# Patient Record
Sex: Male | Born: 1935
Health system: Southern US, Community
[De-identification: ages and names within clinical notes are randomized; demographics above are authoritative.]

## PROBLEM LIST (undated history)

## (undated) DIAGNOSIS — R42 Dizziness and giddiness: Secondary | ICD-10-CM

## (undated) DIAGNOSIS — B952 Enterococcus as the cause of diseases classified elsewhere: Secondary | ICD-10-CM

## (undated) DIAGNOSIS — K449 Diaphragmatic hernia without obstruction or gangrene: Secondary | ICD-10-CM

## (undated) DIAGNOSIS — N183 Chronic kidney disease, stage 3 (moderate): Secondary | ICD-10-CM

## (undated) DIAGNOSIS — A419 Sepsis, unspecified organism: Secondary | ICD-10-CM

## (undated) DIAGNOSIS — N390 Urinary tract infection, site not specified: Secondary | ICD-10-CM

## (undated) DIAGNOSIS — R5381 Other malaise: Secondary | ICD-10-CM

## (undated) DIAGNOSIS — E1129 Type 2 diabetes mellitus with other diabetic kidney complication: Secondary | ICD-10-CM

## (undated) DIAGNOSIS — N400 Enlarged prostate without lower urinary tract symptoms: Secondary | ICD-10-CM

## (undated) DIAGNOSIS — N189 Chronic kidney disease, unspecified: Secondary | ICD-10-CM

## (undated) DIAGNOSIS — D45 Polycythemia vera: Principal | ICD-10-CM

## (undated) DIAGNOSIS — D471 Chronic myeloproliferative disease: Secondary | ICD-10-CM

## (undated) DIAGNOSIS — E119 Type 2 diabetes mellitus without complications: Secondary | ICD-10-CM

## (undated) DIAGNOSIS — E11628 Type 2 diabetes mellitus with other skin complications: Secondary | ICD-10-CM

## (undated) DIAGNOSIS — E46 Unspecified protein-calorie malnutrition: Secondary | ICD-10-CM

## (undated) DIAGNOSIS — K209 Esophagitis, unspecified without bleeding: Secondary | ICD-10-CM

## (undated) DIAGNOSIS — G929 Unspecified toxic encephalopathy: Secondary | ICD-10-CM

## (undated) DIAGNOSIS — G92 Toxic encephalopathy: Secondary | ICD-10-CM

## (undated) DIAGNOSIS — F0390 Unspecified dementia without behavioral disturbance: Secondary | ICD-10-CM

## (undated) DIAGNOSIS — M1990 Unspecified osteoarthritis, unspecified site: Secondary | ICD-10-CM

## (undated) DIAGNOSIS — L03119 Cellulitis of unspecified part of limb: Secondary | ICD-10-CM

## (undated) DIAGNOSIS — I872 Venous insufficiency (chronic) (peripheral): Secondary | ICD-10-CM

## (undated) DIAGNOSIS — D696 Thrombocytopenia, unspecified: Secondary | ICD-10-CM

## (undated) DIAGNOSIS — I10 Essential (primary) hypertension: Secondary | ICD-10-CM

## (undated) DIAGNOSIS — E785 Hyperlipidemia, unspecified: Secondary | ICD-10-CM

## (undated) DIAGNOSIS — G43909 Migraine, unspecified, not intractable, without status migrainosus: Secondary | ICD-10-CM

## (undated) DIAGNOSIS — I214 Non-ST elevation (NSTEMI) myocardial infarction: Secondary | ICD-10-CM

## (undated) DIAGNOSIS — M48061 Spinal stenosis, lumbar region without neurogenic claudication: Secondary | ICD-10-CM

## (undated) DIAGNOSIS — K219 Gastro-esophageal reflux disease without esophagitis: Secondary | ICD-10-CM

## (undated) HISTORY — DX: Diaphragmatic hernia without obstruction or gangrene: K44.9

## (undated) HISTORY — PX: LOWER LEG SOFT TISSUE TUMOR EXCISION: SUR553

## (undated) HISTORY — DX: Polycythemia vera: D45

## (undated) HISTORY — DX: Type 2 diabetes mellitus with other skin complications: E11.628

## (undated) HISTORY — DX: Toxic encephalopathy: G92

## (undated) HISTORY — DX: Venous insufficiency (chronic) (peripheral): I87.2

## (undated) HISTORY — DX: Unspecified dementia, unspecified severity, without behavioral disturbance, psychotic disturbance, mood disturbance, and anxiety: F03.90

## (undated) HISTORY — DX: Type 2 diabetes mellitus without complications: E11.9

## (undated) HISTORY — DX: Cellulitis of unspecified part of limb: L03.119

## (undated) HISTORY — DX: Chronic kidney disease, unspecified: N18.9

## (undated) HISTORY — DX: Chronic myeloproliferative disease: D47.1

## (undated) HISTORY — DX: Non-ST elevation (NSTEMI) myocardial infarction: I21.4

## (undated) HISTORY — DX: Migraine, unspecified, not intractable, without status migrainosus: G43.909

## (undated) HISTORY — DX: Unspecified toxic encephalopathy: G92.9

## (undated) HISTORY — DX: Unspecified osteoarthritis, unspecified site: M19.90

## (undated) HISTORY — DX: Other malaise: R53.81

## (undated) HISTORY — DX: Essential (primary) hypertension: I10

## (undated) HISTORY — DX: Dizziness and giddiness: R42

## (undated) HISTORY — DX: Unspecified protein-calorie malnutrition: E46

## (undated) HISTORY — DX: Benign prostatic hyperplasia without lower urinary tract symptoms: N40.0

## (undated) HISTORY — DX: Spinal stenosis, lumbar region without neurogenic claudication: M48.061

## (undated) HISTORY — DX: Hyperlipidemia, unspecified: E78.5

## (undated) HISTORY — DX: Type 2 diabetes mellitus with other diabetic kidney complication: E11.29

## (undated) HISTORY — DX: Esophagitis, unspecified: K20.9

## (undated) HISTORY — DX: Esophagitis, unspecified without bleeding: K20.90

## (undated) HISTORY — DX: Gastro-esophageal reflux disease without esophagitis: K21.9

## (undated) HISTORY — DX: Urinary tract infection, site not specified: N39.0

## (undated) HISTORY — DX: Enterococcus as the cause of diseases classified elsewhere: B95.2

## (undated) HISTORY — DX: Thrombocytopenia, unspecified: D69.6

## (undated) HISTORY — DX: Sepsis, unspecified organism: A41.9

## (undated) HISTORY — DX: Chronic kidney disease, stage 3 (moderate): N18.3

---

## 1991-02-14 HISTORY — PX: TOTAL HIP ARTHROPLASTY: SHX124

## 1998-02-02 ENCOUNTER — Ambulatory Visit (HOSPITAL_COMMUNITY): Admission: RE | Admit: 1998-02-02 | Discharge: 1998-02-02 | Payer: Self-pay | Admitting: *Deleted

## 1998-09-10 ENCOUNTER — Encounter: Admission: RE | Admit: 1998-09-10 | Discharge: 1998-09-13 | Payer: Self-pay | Admitting: Family Medicine

## 1998-09-16 ENCOUNTER — Encounter: Admission: RE | Admit: 1998-09-16 | Discharge: 1998-09-16 | Payer: Self-pay | Admitting: Family Medicine

## 1998-10-15 ENCOUNTER — Encounter (HOSPITAL_COMMUNITY): Payer: Self-pay | Admitting: Oncology

## 1998-10-15 ENCOUNTER — Ambulatory Visit (HOSPITAL_COMMUNITY): Admission: RE | Admit: 1998-10-15 | Discharge: 1998-10-15 | Payer: Self-pay | Admitting: Oncology

## 1998-11-30 ENCOUNTER — Ambulatory Visit (HOSPITAL_COMMUNITY): Admission: RE | Admit: 1998-11-30 | Discharge: 1998-11-30 | Payer: Self-pay | Admitting: Oncology

## 1999-02-08 ENCOUNTER — Encounter: Admission: RE | Admit: 1999-02-08 | Discharge: 1999-02-08 | Payer: Self-pay | Admitting: *Deleted

## 2002-05-21 ENCOUNTER — Ambulatory Visit (HOSPITAL_COMMUNITY): Admission: RE | Admit: 2002-05-21 | Discharge: 2002-05-21 | Payer: Self-pay | Admitting: *Deleted

## 2003-12-10 ENCOUNTER — Ambulatory Visit: Payer: Self-pay | Admitting: Oncology

## 2003-12-15 ENCOUNTER — Ambulatory Visit: Payer: Self-pay | Admitting: Oncology

## 2004-02-16 ENCOUNTER — Ambulatory Visit: Payer: Self-pay | Admitting: Oncology

## 2004-04-18 ENCOUNTER — Ambulatory Visit: Payer: Self-pay | Admitting: Oncology

## 2004-06-15 ENCOUNTER — Ambulatory Visit: Payer: Self-pay | Admitting: Oncology

## 2004-08-08 ENCOUNTER — Ambulatory Visit: Payer: Self-pay | Admitting: Oncology

## 2004-10-03 ENCOUNTER — Ambulatory Visit: Payer: Self-pay | Admitting: Oncology

## 2004-12-14 ENCOUNTER — Ambulatory Visit: Payer: Self-pay | Admitting: Oncology

## 2005-02-09 ENCOUNTER — Ambulatory Visit: Payer: Self-pay | Admitting: Oncology

## 2005-04-05 ENCOUNTER — Ambulatory Visit: Payer: Self-pay | Admitting: Oncology

## 2005-06-13 ENCOUNTER — Ambulatory Visit: Payer: Self-pay | Admitting: Oncology

## 2005-06-15 LAB — CBC WITH DIFFERENTIAL/PLATELET
Basophils Absolute: 0 10*3/uL (ref 0.0–0.1)
EOS%: 2.2 % (ref 0.0–7.0)
HCT: 40.4 % (ref 38.7–49.9)
HGB: 13 g/dL (ref 13.0–17.1)
MCH: 22.3 pg — ABNORMAL LOW (ref 28.0–33.4)
MCV: 69.4 fL — ABNORMAL LOW (ref 81.6–98.0)
MONO%: 8.2 % (ref 0.0–13.0)
NEUT%: 58.7 % (ref 40.0–75.0)

## 2005-08-07 ENCOUNTER — Ambulatory Visit: Payer: Self-pay | Admitting: Oncology

## 2005-08-07 LAB — CBC WITH DIFFERENTIAL/PLATELET
Basophils Absolute: 0 10*3/uL (ref 0.0–0.1)
Eosinophils Absolute: 0.1 10*3/uL (ref 0.0–0.5)
HCT: 41.3 % (ref 38.7–49.9)
HGB: 13.2 g/dL (ref 13.0–17.1)
MONO#: 0.5 10*3/uL (ref 0.1–0.9)
NEUT%: 67.5 % (ref 40.0–75.0)
WBC: 5.9 10*3/uL (ref 4.0–10.0)
lymph#: 1.3 10*3/uL (ref 0.9–3.3)

## 2005-09-25 ENCOUNTER — Encounter: Admission: RE | Admit: 2005-09-25 | Discharge: 2005-09-25 | Payer: Self-pay | Admitting: Internal Medicine

## 2005-09-27 ENCOUNTER — Encounter: Admission: RE | Admit: 2005-09-27 | Discharge: 2005-09-27 | Payer: Self-pay | Admitting: Internal Medicine

## 2005-09-29 ENCOUNTER — Ambulatory Visit: Payer: Self-pay | Admitting: Oncology

## 2005-10-05 LAB — CBC WITH DIFFERENTIAL/PLATELET
BASO%: 0.6 % (ref 0.0–2.0)
EOS%: 1.7 % (ref 0.0–7.0)
HCT: 41.7 % (ref 38.7–49.9)
HGB: 13.5 g/dL (ref 13.0–17.1)
MCH: 22.6 pg — ABNORMAL LOW (ref 28.0–33.4)
MCHC: 32.4 g/dL (ref 32.0–35.9)
MONO#: 0.5 10*3/uL (ref 0.1–0.9)
NEUT%: 60.6 % (ref 40.0–75.0)
RDW: 17.6 % — ABNORMAL HIGH (ref 11.2–14.6)
WBC: 5.9 10*3/uL (ref 4.0–10.0)
lymph#: 1.7 10*3/uL (ref 0.9–3.3)

## 2005-10-19 LAB — CBC WITH DIFFERENTIAL/PLATELET
Basophils Absolute: 0 10*3/uL (ref 0.0–0.1)
Eosinophils Absolute: 0.1 10*3/uL (ref 0.0–0.5)
HCT: 41.7 % (ref 38.7–49.9)
HGB: 13.4 g/dL (ref 13.0–17.1)
MCH: 22.4 pg — ABNORMAL LOW (ref 28.0–33.4)
MONO#: 0.3 10*3/uL (ref 0.1–0.9)
NEUT#: 2.6 10*3/uL (ref 1.5–6.5)
NEUT%: 57.6 % (ref 40.0–75.0)
RDW: 17.7 % — ABNORMAL HIGH (ref 11.2–14.6)
lymph#: 1.5 10*3/uL (ref 0.9–3.3)

## 2005-10-19 LAB — COMPREHENSIVE METABOLIC PANEL
ALT: 14 U/L (ref 0–40)
CO2: 24 mEq/L (ref 19–32)
Creatinine, Ser: 1.34 mg/dL (ref 0.40–1.50)
Glucose, Bld: 225 mg/dL — ABNORMAL HIGH (ref 70–99)
Total Bilirubin: 0.8 mg/dL (ref 0.3–1.2)

## 2005-10-19 LAB — LACTATE DEHYDROGENASE: LDH: 146 U/L (ref 94–250)

## 2005-11-13 LAB — CBC WITH DIFFERENTIAL/PLATELET
BASO%: 0.6 % (ref 0.0–2.0)
EOS%: 2.6 % (ref 0.0–7.0)
LYMPH%: 30.5 % (ref 14.0–48.0)
MCH: 22.6 pg — ABNORMAL LOW (ref 28.0–33.4)
MCHC: 32.1 g/dL (ref 32.0–35.9)
MONO#: 0.3 10*3/uL (ref 0.1–0.9)
RBC: 5.48 10*6/uL (ref 4.20–5.71)
WBC: 4.8 10*3/uL (ref 4.0–10.0)
lymph#: 1.5 10*3/uL (ref 0.9–3.3)

## 2005-11-15 ENCOUNTER — Encounter: Admission: RE | Admit: 2005-11-15 | Discharge: 2005-11-15 | Payer: Self-pay | Admitting: Oncology

## 2005-11-24 ENCOUNTER — Ambulatory Visit (HOSPITAL_COMMUNITY): Admission: RE | Admit: 2005-11-24 | Discharge: 2005-11-24 | Payer: Self-pay | Admitting: Oncology

## 2005-12-19 ENCOUNTER — Ambulatory Visit: Payer: Self-pay | Admitting: Oncology

## 2005-12-21 LAB — CBC WITH DIFFERENTIAL/PLATELET
BASO%: 0.5 % (ref 0.0–2.0)
LYMPH%: 26.4 % (ref 14.0–48.0)
MCH: 22.4 pg — ABNORMAL LOW (ref 28.0–33.4)
MCHC: 32 g/dL (ref 32.0–35.9)
MCV: 70.1 fL — ABNORMAL LOW (ref 81.6–98.0)
MONO%: 7.3 % (ref 0.0–13.0)
NEUT%: 62.5 % (ref 40.0–75.0)
Platelets: 191 10*3/uL (ref 145–400)
RBC: 5.54 10*6/uL (ref 4.20–5.71)

## 2006-02-10 ENCOUNTER — Ambulatory Visit: Payer: Self-pay | Admitting: Oncology

## 2006-02-15 LAB — CBC WITH DIFFERENTIAL/PLATELET
Basophils Absolute: 0.1 10*3/uL (ref 0.0–0.1)
EOS%: 1.7 % (ref 0.0–7.0)
HGB: 12.4 g/dL — ABNORMAL LOW (ref 13.0–17.1)
LYMPH%: 28.4 % (ref 14.0–48.0)
MCH: 21.5 pg — ABNORMAL LOW (ref 28.0–33.4)
MCV: 69.5 fL — ABNORMAL LOW (ref 81.6–98.0)
MONO%: 8.3 % (ref 0.0–13.0)
Platelets: 125 10*3/uL — ABNORMAL LOW (ref 145–400)
RDW: 14.2 % (ref 11.2–14.6)

## 2006-04-16 ENCOUNTER — Ambulatory Visit: Payer: Self-pay | Admitting: Oncology

## 2006-04-19 LAB — CBC WITH DIFFERENTIAL/PLATELET
Basophils Absolute: 0 10*3/uL (ref 0.0–0.1)
Eosinophils Absolute: 0.1 10*3/uL (ref 0.0–0.5)
HCT: 39.2 % (ref 38.7–49.9)
HGB: 13.1 g/dL (ref 13.0–17.1)
MCH: 22.4 pg — ABNORMAL LOW (ref 28.0–33.4)
MCV: 66.9 fL — ABNORMAL LOW (ref 81.6–98.0)
MONO%: 7.6 % (ref 0.0–13.0)
NEUT#: 3.4 10*3/uL (ref 1.5–6.5)
NEUT%: 59.1 % (ref 40.0–75.0)
RDW: 17.4 % — ABNORMAL HIGH (ref 11.2–14.6)
lymph#: 1.8 10*3/uL (ref 0.9–3.3)

## 2006-05-14 LAB — CBC WITH DIFFERENTIAL/PLATELET
Basophils Absolute: 0 10*3/uL (ref 0.0–0.1)
EOS%: 3 % (ref 0.0–7.0)
Eosinophils Absolute: 0.2 10*3/uL (ref 0.0–0.5)
HGB: 13.2 g/dL (ref 13.0–17.1)
LYMPH%: 32.5 % (ref 14.0–48.0)
MCH: 22.4 pg — ABNORMAL LOW (ref 28.0–33.4)
MCV: 67.1 fL — ABNORMAL LOW (ref 81.6–98.0)
MONO%: 7 % (ref 0.0–13.0)
NEUT#: 3.1 10*3/uL (ref 1.5–6.5)
NEUT%: 57 % (ref 40.0–75.0)
Platelets: 102 10*3/uL — ABNORMAL LOW (ref 145–400)

## 2006-05-14 LAB — COMPREHENSIVE METABOLIC PANEL
Albumin: 4.4 g/dL (ref 3.5–5.2)
Alkaline Phosphatase: 50 U/L (ref 39–117)
BUN: 31 mg/dL — ABNORMAL HIGH (ref 6–23)
Creatinine, Ser: 1.46 mg/dL (ref 0.40–1.50)
Glucose, Bld: 125 mg/dL — ABNORMAL HIGH (ref 70–99)
Total Bilirubin: 0.5 mg/dL (ref 0.3–1.2)

## 2006-06-21 ENCOUNTER — Ambulatory Visit: Payer: Self-pay | Admitting: Oncology

## 2006-06-21 LAB — CBC WITH DIFFERENTIAL/PLATELET
Basophils Absolute: 0.1 10*3/uL (ref 0.0–0.1)
Eosinophils Absolute: 0.1 10*3/uL (ref 0.0–0.5)
HCT: 43.1 % (ref 38.7–49.9)
HGB: 13.1 g/dL (ref 13.0–17.1)
LYMPH%: 33.3 % (ref 14.0–48.0)
MONO#: 0.5 10*3/uL (ref 0.1–0.9)
NEUT#: 3.2 10*3/uL (ref 1.5–6.5)
Platelets: 148 10*3/uL (ref 145–400)
RBC: 6.07 10*6/uL — ABNORMAL HIGH (ref 4.20–5.71)
WBC: 5.8 10*3/uL (ref 4.0–10.0)

## 2006-08-23 ENCOUNTER — Ambulatory Visit: Payer: Self-pay | Admitting: Oncology

## 2006-08-27 LAB — CBC WITH DIFFERENTIAL/PLATELET
BASO%: 0.7 % (ref 0.0–2.0)
Eosinophils Absolute: 0.1 10*3/uL (ref 0.0–0.5)
HCT: 39.4 % (ref 38.7–49.9)
LYMPH%: 36.3 % (ref 14.0–48.0)
MCHC: 33.2 g/dL (ref 32.0–35.9)
MONO#: 0.4 10*3/uL (ref 0.1–0.9)
NEUT%: 52.2 % (ref 40.0–75.0)
Platelets: 125 10*3/uL — ABNORMAL LOW (ref 145–400)
WBC: 4.6 10*3/uL (ref 4.0–10.0)

## 2006-10-24 ENCOUNTER — Ambulatory Visit: Payer: Self-pay | Admitting: Oncology

## 2006-10-29 LAB — CBC WITH DIFFERENTIAL/PLATELET
BASO%: 1.3 % (ref 0.0–2.0)
EOS%: 2.3 % (ref 0.0–7.0)
MCH: 22.3 pg — ABNORMAL LOW (ref 28.0–33.4)
MCHC: 30.9 g/dL — ABNORMAL LOW (ref 32.0–35.9)
RBC: 5.97 10*6/uL — ABNORMAL HIGH (ref 4.20–5.71)
RDW: 13.8 % (ref 11.2–14.6)
lymph#: 1.8 10*3/uL (ref 0.9–3.3)

## 2006-11-23 LAB — CBC WITH DIFFERENTIAL/PLATELET
Basophils Absolute: 0 10*3/uL (ref 0.0–0.1)
EOS%: 2.5 % (ref 0.0–7.0)
Eosinophils Absolute: 0.1 10*3/uL (ref 0.0–0.5)
HGB: 13.1 g/dL (ref 13.0–17.1)
MCH: 22.7 pg — ABNORMAL LOW (ref 28.0–33.4)
NEUT#: 2.4 10*3/uL (ref 1.5–6.5)
RDW: 17.1 % — ABNORMAL HIGH (ref 11.2–14.6)
WBC: 4.3 10*3/uL (ref 4.0–10.0)
lymph#: 1.5 10*3/uL (ref 0.9–3.3)

## 2006-11-23 LAB — COMPREHENSIVE METABOLIC PANEL
AST: 16 U/L (ref 0–37)
Albumin: 4.1 g/dL (ref 3.5–5.2)
BUN: 23 mg/dL (ref 6–23)
Calcium: 9.2 mg/dL (ref 8.4–10.5)
Chloride: 106 mEq/L (ref 96–112)
Potassium: 4.4 mEq/L (ref 3.5–5.3)

## 2007-01-21 ENCOUNTER — Ambulatory Visit: Payer: Self-pay | Admitting: Oncology

## 2007-01-23 LAB — CBC WITH DIFFERENTIAL/PLATELET
BASO%: 1.5 % (ref 0.0–2.0)
EOS%: 1 % (ref 0.0–7.0)
MCH: 23.1 pg — ABNORMAL LOW (ref 28.0–33.4)
MCHC: 33 g/dL (ref 32.0–35.9)
MCV: 69.9 fL — ABNORMAL LOW (ref 81.6–98.0)
MONO%: 10.2 % (ref 0.0–13.0)
NEUT#: 2.8 10*3/uL (ref 1.5–6.5)
RBC: 5.95 10*6/uL — ABNORMAL HIGH (ref 4.20–5.71)
RDW: 17.4 % — ABNORMAL HIGH (ref 11.2–14.6)

## 2007-03-22 ENCOUNTER — Ambulatory Visit: Payer: Self-pay | Admitting: Oncology

## 2007-03-26 LAB — CBC WITH DIFFERENTIAL/PLATELET
Basophils Absolute: 0.1 10*3/uL (ref 0.0–0.1)
Eosinophils Absolute: 0.1 10*3/uL (ref 0.0–0.5)
HCT: 42 % (ref 38.7–49.9)
LYMPH%: 31.8 % (ref 14.0–48.0)
MCV: 73.4 fL — ABNORMAL LOW (ref 81.6–98.0)
MONO%: 8.4 % (ref 0.0–13.0)
NEUT#: 2.4 10*3/uL (ref 1.5–6.5)
NEUT%: 54.9 % (ref 40.0–75.0)
Platelets: 125 10*3/uL — ABNORMAL LOW (ref 145–400)
RBC: 5.73 10*6/uL — ABNORMAL HIGH (ref 4.20–5.71)

## 2007-05-22 ENCOUNTER — Ambulatory Visit: Payer: Self-pay | Admitting: Oncology

## 2007-05-24 LAB — CBC WITH DIFFERENTIAL/PLATELET
BASO%: 2.2 % — ABNORMAL HIGH (ref 0.0–2.0)
EOS%: 2.6 % (ref 0.0–7.0)
MCH: 22.8 pg — ABNORMAL LOW (ref 28.0–33.4)
MCHC: 31.6 g/dL — ABNORMAL LOW (ref 32.0–35.9)
NEUT%: 53.5 % (ref 40.0–75.0)
RDW: 15 % — ABNORMAL HIGH (ref 11.2–14.6)
lymph#: 1.7 10*3/uL (ref 0.9–3.3)

## 2007-05-24 LAB — COMPREHENSIVE METABOLIC PANEL
ALT: 10 U/L (ref 0–53)
AST: 14 U/L (ref 0–37)
Alkaline Phosphatase: 46 U/L (ref 39–117)
Calcium: 9.7 mg/dL (ref 8.4–10.5)
Chloride: 106 mEq/L (ref 96–112)
Creatinine, Ser: 1.59 mg/dL — ABNORMAL HIGH (ref 0.40–1.50)
Potassium: 4.6 mEq/L (ref 3.5–5.3)

## 2007-08-21 ENCOUNTER — Ambulatory Visit: Payer: Self-pay | Admitting: Oncology

## 2007-08-26 LAB — CBC WITH DIFFERENTIAL/PLATELET
BASO%: 1.3 % (ref 0.0–2.0)
EOS%: 1.8 % (ref 0.0–7.0)
HCT: 42.4 % (ref 38.7–49.9)
LYMPH%: 34.7 % (ref 14.0–48.0)
MCH: 23.1 pg — ABNORMAL LOW (ref 28.0–33.4)
MCHC: 32 g/dL (ref 32.0–35.9)
NEUT%: 54 % (ref 40.0–75.0)
Platelets: 99 10*3/uL — ABNORMAL LOW (ref 145–400)

## 2007-11-21 ENCOUNTER — Ambulatory Visit: Payer: Self-pay | Admitting: Oncology

## 2007-11-25 LAB — COMPREHENSIVE METABOLIC PANEL
ALT: 13 U/L (ref 0–53)
Alkaline Phosphatase: 53 U/L (ref 39–117)
CO2: 23 mEq/L (ref 19–32)
Sodium: 138 mEq/L (ref 135–145)
Total Bilirubin: 0.6 mg/dL (ref 0.3–1.2)
Total Protein: 6.5 g/dL (ref 6.0–8.3)

## 2007-11-25 LAB — CBC WITH DIFFERENTIAL/PLATELET
Eosinophils Absolute: 0.1 10*3/uL (ref 0.0–0.5)
LYMPH%: 25.6 % (ref 14.0–48.0)
MONO#: 0.3 10*3/uL (ref 0.1–0.9)
NEUT#: 3.5 10*3/uL (ref 1.5–6.5)
Platelets: 111 10*3/uL — ABNORMAL LOW (ref 145–400)
RBC: 5.81 10*6/uL — ABNORMAL HIGH (ref 4.20–5.71)
WBC: 5.3 10*3/uL (ref 4.0–10.0)
lymph#: 1.4 10*3/uL (ref 0.9–3.3)

## 2008-02-21 ENCOUNTER — Ambulatory Visit: Payer: Self-pay | Admitting: Oncology

## 2008-02-25 LAB — CBC WITH DIFFERENTIAL/PLATELET
BASO%: 0.7 % (ref 0.0–2.0)
Eosinophils Absolute: 0.1 10*3/uL (ref 0.0–0.5)
MCV: 72.2 fL — ABNORMAL LOW (ref 81.6–98.0)
MONO#: 0.3 10*3/uL (ref 0.1–0.9)
MONO%: 6.3 % (ref 0.0–13.0)
NEUT#: 2.3 10*3/uL (ref 1.5–6.5)
RBC: 5.89 10*6/uL — ABNORMAL HIGH (ref 4.20–5.71)
RDW: 16.9 % — ABNORMAL HIGH (ref 11.2–14.6)
WBC: 4.3 10*3/uL (ref 4.0–10.0)

## 2008-05-22 ENCOUNTER — Ambulatory Visit: Payer: Self-pay | Admitting: Oncology

## 2008-05-26 LAB — CBC WITH DIFFERENTIAL/PLATELET
Basophils Absolute: 0 10*3/uL (ref 0.0–0.1)
Eosinophils Absolute: 0.1 10*3/uL (ref 0.0–0.5)
HGB: 14.3 g/dL (ref 13.0–17.1)
MCV: 72.6 fL — ABNORMAL LOW (ref 79.3–98.0)
NEUT#: 2.6 10*3/uL (ref 1.5–6.5)
RDW: 16.7 % — ABNORMAL HIGH (ref 11.0–14.6)
lymph#: 1.7 10*3/uL (ref 0.9–3.3)

## 2008-05-26 LAB — COMPREHENSIVE METABOLIC PANEL
Albumin: 3.9 g/dL (ref 3.5–5.2)
BUN: 28 mg/dL — ABNORMAL HIGH (ref 6–23)
Calcium: 9.2 mg/dL (ref 8.4–10.5)
Chloride: 106 mEq/L (ref 96–112)
Glucose, Bld: 230 mg/dL — ABNORMAL HIGH (ref 70–99)
Potassium: 4.2 mEq/L (ref 3.5–5.3)

## 2008-08-21 ENCOUNTER — Ambulatory Visit: Payer: Self-pay | Admitting: Oncology

## 2008-08-25 LAB — CBC WITH DIFFERENTIAL/PLATELET
BASO%: 0.5 % (ref 0.0–2.0)
HCT: 44.2 % (ref 38.4–49.9)
MCHC: 33 g/dL (ref 32.0–36.0)
MONO#: 0.4 10*3/uL (ref 0.1–0.9)
NEUT#: 3.4 10*3/uL (ref 1.5–6.5)
NEUT%: 57 % (ref 39.0–75.0)
WBC: 6 10*3/uL (ref 4.0–10.3)
lymph#: 2 10*3/uL (ref 0.9–3.3)
nRBC: 0 % (ref 0–0)

## 2008-11-20 ENCOUNTER — Ambulatory Visit: Payer: Self-pay | Admitting: Oncology

## 2008-11-23 LAB — COMPREHENSIVE METABOLIC PANEL
BUN: 26 mg/dL — ABNORMAL HIGH (ref 6–23)
CO2: 22 mEq/L (ref 19–32)
Creatinine, Ser: 1.45 mg/dL (ref 0.40–1.50)
Glucose, Bld: 226 mg/dL — ABNORMAL HIGH (ref 70–99)
Sodium: 137 mEq/L (ref 135–145)
Total Bilirubin: 0.5 mg/dL (ref 0.3–1.2)
Total Protein: 6.3 g/dL (ref 6.0–8.3)

## 2008-11-23 LAB — CBC WITH DIFFERENTIAL/PLATELET
Basophils Absolute: 0 10*3/uL (ref 0.0–0.1)
Eosinophils Absolute: 0.1 10*3/uL (ref 0.0–0.5)
HCT: 42.9 % (ref 38.4–49.9)
HGB: 14.3 g/dL (ref 13.0–17.1)
LYMPH%: 30.1 % (ref 14.0–49.0)
MCV: 75 fL — ABNORMAL LOW (ref 79.3–98.0)
MONO#: 0.3 10*3/uL (ref 0.1–0.9)
NEUT#: 2.5 10*3/uL (ref 1.5–6.5)
NEUT%: 59.5 % (ref 39.0–75.0)
Platelets: 99 10*3/uL — ABNORMAL LOW (ref 140–400)
WBC: 4.3 10*3/uL (ref 4.0–10.3)

## 2008-11-23 LAB — LACTATE DEHYDROGENASE: LDH: 155 U/L (ref 94–250)

## 2009-02-19 ENCOUNTER — Ambulatory Visit: Payer: Self-pay | Admitting: Oncology

## 2009-02-23 LAB — CBC WITH DIFFERENTIAL/PLATELET
EOS%: 1.7 % (ref 0.0–7.0)
HCT: 44.8 % (ref 38.4–49.9)
MCH: 24.4 pg — ABNORMAL LOW (ref 27.2–33.4)
MCHC: 31.7 g/dL — ABNORMAL LOW (ref 32.0–36.0)
MCV: 77 fL — ABNORMAL LOW (ref 79.3–98.0)
MONO#: 0.4 10*3/uL (ref 0.1–0.9)
Platelets: 80 10*3/uL — ABNORMAL LOW (ref 140–400)
RBC: 5.82 10*6/uL (ref 4.20–5.82)
WBC: 4.1 10*3/uL (ref 4.0–10.3)

## 2009-03-04 LAB — CBC WITH DIFFERENTIAL/PLATELET
Eosinophils Absolute: 0.1 10*3/uL (ref 0.0–0.5)
HCT: 45.2 % (ref 38.4–49.9)
LYMPH%: 35.6 % (ref 14.0–49.0)
MONO#: 0.4 10*3/uL (ref 0.1–0.9)
NEUT#: 2.2 10*3/uL (ref 1.5–6.5)
Platelets: 76 10*3/uL — ABNORMAL LOW (ref 140–400)
RBC: 5.89 10*6/uL — ABNORMAL HIGH (ref 4.20–5.82)
WBC: 4.1 10*3/uL (ref 4.0–10.3)
nRBC: 0 % (ref 0–0)

## 2009-05-21 ENCOUNTER — Ambulatory Visit: Payer: Self-pay | Admitting: Oncology

## 2009-05-25 LAB — CBC WITH DIFFERENTIAL/PLATELET
BASO%: 0.6 % (ref 0.0–2.0)
EOS%: 1.7 % (ref 0.0–7.0)
HCT: 41.9 % (ref 38.4–49.9)
HGB: 13.7 g/dL (ref 13.0–17.1)
MCHC: 32.7 g/dL (ref 32.0–36.0)
MONO#: 0.5 10*3/uL (ref 0.1–0.9)
NEUT%: 62.7 % (ref 39.0–75.0)
RDW: 16.5 % — ABNORMAL HIGH (ref 11.0–14.6)
WBC: 6.2 10*3/uL (ref 4.0–10.3)
lymph#: 1.7 10*3/uL (ref 0.9–3.3)

## 2009-05-25 LAB — COMPREHENSIVE METABOLIC PANEL
ALT: 15 U/L (ref 0–53)
AST: 17 U/L (ref 0–37)
Albumin: 4.2 g/dL (ref 3.5–5.2)
CO2: 22 mEq/L (ref 19–32)
Calcium: 9.6 mg/dL (ref 8.4–10.5)
Chloride: 107 mEq/L (ref 96–112)
Creatinine, Ser: 1.79 mg/dL — ABNORMAL HIGH (ref 0.40–1.50)
Potassium: 4.3 mEq/L (ref 3.5–5.3)
Total Protein: 6.5 g/dL (ref 6.0–8.3)

## 2009-05-25 LAB — LACTATE DEHYDROGENASE: LDH: 141 U/L (ref 94–250)

## 2009-08-20 ENCOUNTER — Ambulatory Visit: Payer: Self-pay | Admitting: Oncology

## 2009-08-24 LAB — CBC WITH DIFFERENTIAL/PLATELET
BASO%: 0.9 % (ref 0.0–2.0)
Basophils Absolute: 0.1 10*3/uL (ref 0.0–0.1)
EOS%: 1.9 % (ref 0.0–7.0)
Eosinophils Absolute: 0.1 10*3/uL (ref 0.0–0.5)
HCT: 43.7 % (ref 38.4–49.9)
HGB: 14.1 g/dL (ref 13.0–17.1)
LYMPH%: 32.8 % (ref 14.0–49.0)
MCH: 24.7 pg — ABNORMAL LOW (ref 27.2–33.4)
MCHC: 32.3 g/dL (ref 32.0–36.0)
MCV: 76.4 fL — ABNORMAL LOW (ref 79.3–98.0)
MONO#: 0.4 10*3/uL (ref 0.1–0.9)
MONO%: 6.3 % (ref 0.0–14.0)
NEUT#: 3.3 10*3/uL (ref 1.5–6.5)
NEUT%: 58.1 % (ref 39.0–75.0)
Platelets: 103 10*3/uL — ABNORMAL LOW (ref 140–400)
RBC: 5.72 10*6/uL (ref 4.20–5.82)
RDW: 16.9 % — ABNORMAL HIGH (ref 11.0–14.6)
WBC: 5.7 10*3/uL (ref 4.0–10.3)
lymph#: 1.9 10*3/uL (ref 0.9–3.3)
nRBC: 0 % (ref 0–0)

## 2009-11-19 ENCOUNTER — Ambulatory Visit: Payer: Self-pay | Admitting: Oncology

## 2009-11-23 LAB — CBC WITH DIFFERENTIAL/PLATELET
Basophils Absolute: 0 10*3/uL (ref 0.0–0.1)
EOS%: 2.3 % (ref 0.0–7.0)
Eosinophils Absolute: 0.1 10*3/uL (ref 0.0–0.5)
HCT: 45 % (ref 38.4–49.9)
HGB: 14.6 g/dL (ref 13.0–17.1)
MCH: 24.7 pg — ABNORMAL LOW (ref 27.2–33.4)
MONO#: 0.3 10*3/uL (ref 0.1–0.9)
NEUT%: 57.4 % (ref 39.0–75.0)
lymph#: 1.5 10*3/uL (ref 0.9–3.3)

## 2009-11-23 LAB — COMPREHENSIVE METABOLIC PANEL
Albumin: 4.4 g/dL (ref 3.5–5.2)
BUN: 27 mg/dL — ABNORMAL HIGH (ref 6–23)
CO2: 24 mEq/L (ref 19–32)
Calcium: 9.9 mg/dL (ref 8.4–10.5)
Chloride: 108 mEq/L (ref 96–112)
Glucose, Bld: 142 mg/dL — ABNORMAL HIGH (ref 70–99)
Potassium: 4.7 mEq/L (ref 3.5–5.3)

## 2009-11-26 LAB — JAK2 EXONS 12-15

## 2009-12-28 ENCOUNTER — Ambulatory Visit (HOSPITAL_COMMUNITY): Admission: RE | Admit: 2009-12-28 | Discharge: 2009-12-28 | Payer: Self-pay | Admitting: Nephrology

## 2010-01-14 ENCOUNTER — Ambulatory Visit (HOSPITAL_COMMUNITY)
Admission: RE | Admit: 2010-01-14 | Discharge: 2010-01-14 | Payer: Self-pay | Source: Home / Self Care | Admitting: Nephrology

## 2010-02-22 ENCOUNTER — Ambulatory Visit: Payer: Self-pay | Admitting: Oncology

## 2010-02-24 LAB — CBC WITH DIFFERENTIAL/PLATELET
BASO%: 0.8 % (ref 0.0–2.0)
Basophils Absolute: 0 10*3/uL (ref 0.0–0.1)
EOS%: 3 % (ref 0.0–7.0)
Eosinophils Absolute: 0.2 10*3/uL (ref 0.0–0.5)
HCT: 43.7 % (ref 38.4–49.9)
HGB: 14.2 g/dL (ref 13.0–17.1)
LYMPH%: 37.9 % (ref 14.0–49.0)
MCH: 24.2 pg — ABNORMAL LOW (ref 27.2–33.4)
MCHC: 32.5 g/dL (ref 32.0–36.0)
MCV: 74.6 fL — ABNORMAL LOW (ref 79.3–98.0)
MONO#: 0.4 10*3/uL (ref 0.1–0.9)
MONO%: 8.1 % (ref 0.0–14.0)
NEUT#: 2.5 10*3/uL (ref 1.5–6.5)
NEUT%: 50.2 % (ref 39.0–75.0)
Platelets: 96 10*3/uL — ABNORMAL LOW (ref 140–400)
RBC: 5.86 10*6/uL — ABNORMAL HIGH (ref 4.20–5.82)
RDW: 15.5 % — ABNORMAL HIGH (ref 11.0–14.6)
WBC: 5 10*3/uL (ref 4.0–10.3)
lymph#: 1.9 10*3/uL (ref 0.9–3.3)
nRBC: 0 % (ref 0–0)

## 2010-03-05 ENCOUNTER — Encounter (HOSPITAL_COMMUNITY): Payer: Self-pay | Admitting: Oncology

## 2010-04-26 LAB — CBC
Hemoglobin: 13.9 g/dL (ref 13.0–17.0)
MCH: 24.4 pg — ABNORMAL LOW (ref 26.0–34.0)
MCHC: 33.2 g/dL (ref 30.0–36.0)
Platelets: 103 10*3/uL — ABNORMAL LOW (ref 150–400)
RDW: 15.2 % (ref 11.5–15.5)

## 2010-04-26 LAB — GLUCOSE, CAPILLARY

## 2010-04-26 LAB — PROTIME-INR
INR: 1.11 (ref 0.00–1.49)
Prothrombin Time: 14.5 seconds (ref 11.6–15.2)

## 2010-05-26 ENCOUNTER — Other Ambulatory Visit (HOSPITAL_COMMUNITY): Payer: Self-pay | Admitting: Oncology

## 2010-05-26 ENCOUNTER — Encounter (HOSPITAL_BASED_OUTPATIENT_CLINIC_OR_DEPARTMENT_OTHER): Payer: Medicare Other | Admitting: Oncology

## 2010-05-26 DIAGNOSIS — C61 Malignant neoplasm of prostate: Secondary | ICD-10-CM

## 2010-05-26 DIAGNOSIS — D696 Thrombocytopenia, unspecified: Secondary | ICD-10-CM

## 2010-05-26 DIAGNOSIS — D45 Polycythemia vera: Secondary | ICD-10-CM

## 2010-05-26 LAB — CBC WITH DIFFERENTIAL/PLATELET
BASO%: 0.4 % (ref 0.0–2.0)
Basophils Absolute: 0 10*3/uL (ref 0.0–0.1)
EOS%: 3.7 % (ref 0.0–7.0)
HCT: 42.2 % (ref 38.4–49.9)
HGB: 14.2 g/dL (ref 13.0–17.1)
LYMPH%: 24.7 % (ref 14.0–49.0)
MCH: 25.8 pg — ABNORMAL LOW (ref 27.2–33.4)
MCHC: 33.6 g/dL (ref 32.0–36.0)
MCV: 76.8 fL — ABNORMAL LOW (ref 79.3–98.0)
NEUT%: 64.8 % (ref 39.0–75.0)
Platelets: 104 10*3/uL — ABNORMAL LOW (ref 140–400)
lymph#: 1.2 10*3/uL (ref 0.9–3.3)

## 2010-05-26 LAB — COMPREHENSIVE METABOLIC PANEL
AST: 17 U/L (ref 0–37)
BUN: 34 mg/dL — ABNORMAL HIGH (ref 6–23)
Calcium: 8.9 mg/dL (ref 8.4–10.5)
Chloride: 106 mEq/L (ref 96–112)
Creatinine, Ser: 1.87 mg/dL — ABNORMAL HIGH (ref 0.40–1.50)
Total Bilirubin: 0.5 mg/dL (ref 0.3–1.2)

## 2010-05-26 LAB — LACTATE DEHYDROGENASE: LDH: 155 U/L (ref 94–250)

## 2010-07-01 NOTE — Op Note (Signed)
   NAME:  Aaron Stephenson, SLOOP NO.:  [id number]   MEDICAL RECORD NO.:  [id number]                   PATIENT TYPE:  AMB   LOCATION:  ENDO                                 FACILITY:  [facility]   PHYSICIAN:  Georgiana Spinner, M.D.                 DATE OF BIRTH:  06/01/1924   DATE OF PROCEDURE:  DATE OF DISCHARGE:                                 OPERATIVE REPORT   PROCEDURE:  Colonoscopy.   INDICATIONS FOR PROCEDURE:  Colon polyps.   ANESTHESIA:  Demerol 50, Versed 6 mg.   DESCRIPTION OF PROCEDURE:  With the patient mildly sedated in the left  lateral decubitus position, the Olympus videoscopic colonoscope was inserted  in the rectum and passed under direct vision to the cecum identified by the  ileocecal valve and appendiceal orifice both of which were photographed. Of  note, the prep was really suboptimal in that there were multiple areas of  solid stool and almost the entire colon was somewhat coated in liquid brown  stool. In some areas, we washed it away but from this point the colonoscope  was then slowly withdrawn taking circumferential views of the colonic mucosa  visualized stopping only in the rectum which appeared normal on direct and  showed hemorrhoids on retroflexed view. The endoscope was straightened and  withdrawn. The patient's vital signs and pulse oximeter remained stable. The  patient tolerated the procedure well without apparent complications.   FINDINGS:  Diverticulosis of sigmoid colon, internal hemorrhoids, otherwise,  an unremarkable examination limited by the prep which was suboptimal.   PLAN:  Will repeat examination in two years, no gross lesions seen.                                                Georgiana Spinner, M.D.    GMO/MEDQ  D:  05/21/2002  T:  05/21/2002  Job:  [id number]   cc:   Ike Bene, M.D.  [address]  [place]  [place] [address]  Fax: [phone]

## 2010-08-25 ENCOUNTER — Other Ambulatory Visit (HOSPITAL_COMMUNITY): Payer: Self-pay | Admitting: Oncology

## 2010-08-25 ENCOUNTER — Encounter (HOSPITAL_BASED_OUTPATIENT_CLINIC_OR_DEPARTMENT_OTHER): Payer: Medicare Other | Admitting: Oncology

## 2010-08-25 DIAGNOSIS — D45 Polycythemia vera: Secondary | ICD-10-CM

## 2010-08-25 DIAGNOSIS — C61 Malignant neoplasm of prostate: Secondary | ICD-10-CM

## 2010-08-25 DIAGNOSIS — D696 Thrombocytopenia, unspecified: Secondary | ICD-10-CM

## 2010-08-25 LAB — CBC WITH DIFFERENTIAL/PLATELET
Basophils Absolute: 0 10*3/uL (ref 0.0–0.1)
Eosinophils Absolute: 0.1 10*3/uL (ref 0.0–0.5)
HCT: 45.9 % (ref 38.4–49.9)
HGB: 15.1 g/dL (ref 13.0–17.1)
NEUT#: 3.8 10*3/uL (ref 1.5–6.5)
NEUT%: 59.7 % (ref 39.0–75.0)
RDW: 14.7 % — ABNORMAL HIGH (ref 11.0–14.6)
lymph#: 2 10*3/uL (ref 0.9–3.3)

## 2010-11-21 ENCOUNTER — Other Ambulatory Visit (HOSPITAL_COMMUNITY): Payer: Self-pay | Admitting: Oncology

## 2010-11-21 ENCOUNTER — Encounter (HOSPITAL_BASED_OUTPATIENT_CLINIC_OR_DEPARTMENT_OTHER): Payer: Medicare Other | Admitting: Oncology

## 2010-11-21 DIAGNOSIS — D649 Anemia, unspecified: Secondary | ICD-10-CM

## 2010-11-21 DIAGNOSIS — D45 Polycythemia vera: Secondary | ICD-10-CM

## 2010-11-21 DIAGNOSIS — D696 Thrombocytopenia, unspecified: Secondary | ICD-10-CM

## 2010-11-21 DIAGNOSIS — C61 Malignant neoplasm of prostate: Secondary | ICD-10-CM

## 2010-11-21 LAB — LACTATE DEHYDROGENASE: LDH: 156 U/L (ref 94–250)

## 2010-11-21 LAB — CBC WITH DIFFERENTIAL/PLATELET
BASO%: 0.8 % (ref 0.0–2.0)
HCT: 43 % (ref 38.4–49.9)
MCHC: 32.7 g/dL (ref 32.0–36.0)
MONO#: 0.3 10*3/uL (ref 0.1–0.9)
NEUT#: 2.6 10*3/uL (ref 1.5–6.5)
NEUT%: 59.9 % (ref 39.0–75.0)
RBC: 5.73 10*6/uL (ref 4.20–5.82)
WBC: 4.3 10*3/uL (ref 4.0–10.3)
lymph#: 1.3 10*3/uL (ref 0.9–3.3)

## 2010-11-21 LAB — COMPREHENSIVE METABOLIC PANEL
ALT: 12 U/L (ref 0–53)
AST: 17 U/L (ref 0–37)
Albumin: 4.2 g/dL (ref 3.5–5.2)
Calcium: 9.4 mg/dL (ref 8.4–10.5)
Chloride: 105 mEq/L (ref 96–112)
Potassium: 4.6 mEq/L (ref 3.5–5.3)

## 2011-02-11 ENCOUNTER — Telehealth: Payer: Self-pay | Admitting: Oncology

## 2011-02-11 NOTE — Telephone Encounter (Signed)
S/w pt, gave appts 02/21/11 @ 1pm and 05/22/11 @ 10.30am. Advised pt, I will also mail him an appt calendar.

## 2011-02-20 ENCOUNTER — Other Ambulatory Visit: Payer: Self-pay | Admitting: Oncology

## 2011-02-20 ENCOUNTER — Telehealth: Payer: Self-pay | Admitting: Oncology

## 2011-02-20 ENCOUNTER — Encounter: Payer: Self-pay | Admitting: Oncology

## 2011-02-20 DIAGNOSIS — D45 Polycythemia vera: Secondary | ICD-10-CM

## 2011-02-20 HISTORY — DX: Polycythemia vera: D45

## 2011-02-20 NOTE — Telephone Encounter (Signed)
aware of 1/8 appt   aom

## 2011-02-21 ENCOUNTER — Ambulatory Visit: Payer: Medicare Other

## 2011-02-21 ENCOUNTER — Other Ambulatory Visit (HOSPITAL_BASED_OUTPATIENT_CLINIC_OR_DEPARTMENT_OTHER): Payer: Medicare Other

## 2011-02-21 DIAGNOSIS — D45 Polycythemia vera: Secondary | ICD-10-CM | POA: Diagnosis not present

## 2011-02-21 LAB — CBC WITH DIFFERENTIAL/PLATELET
Basophils Absolute: 0 10*3/uL (ref 0.0–0.1)
Eosinophils Absolute: 0.2 10*3/uL (ref 0.0–0.5)
HCT: 42.8 % (ref 38.4–49.9)
HGB: 13.9 g/dL (ref 13.0–17.1)
LYMPH%: 36.2 % (ref 14.0–49.0)
MCV: 74.7 fL — ABNORMAL LOW (ref 79.3–98.0)
MONO#: 0.4 10*3/uL (ref 0.1–0.9)
MONO%: 8.2 % (ref 0.0–14.0)
NEUT#: 2.6 10*3/uL (ref 1.5–6.5)
NEUT%: 52 % (ref 39.0–75.0)
Platelets: 107 10*3/uL — ABNORMAL LOW (ref 140–400)
RBC: 5.73 10*6/uL (ref 4.20–5.82)
WBC: 5 10*3/uL (ref 4.0–10.3)
nRBC: 0 % (ref 0–0)

## 2011-02-21 NOTE — Progress Notes (Unsigned)
Pt entered  Center  Today for labs and Phlebotomy. Lab results of  Hct and Hem of 42.8 and 13.9.  Results  Reviewed  With  Hollie Salk PA, . Vebal consent given to hold    Phlebotomy    Today.  Results discussed with  Patient,  Pt advised to keep appointments.

## 2011-02-22 DIAGNOSIS — L570 Actinic keratosis: Secondary | ICD-10-CM | POA: Diagnosis not present

## 2011-02-22 DIAGNOSIS — L821 Other seborrheic keratosis: Secondary | ICD-10-CM | POA: Diagnosis not present

## 2011-03-02 DIAGNOSIS — E119 Type 2 diabetes mellitus without complications: Secondary | ICD-10-CM | POA: Diagnosis not present

## 2011-03-02 DIAGNOSIS — H2510 Age-related nuclear cataract, unspecified eye: Secondary | ICD-10-CM | POA: Diagnosis not present

## 2011-03-02 DIAGNOSIS — H40019 Open angle with borderline findings, low risk, unspecified eye: Secondary | ICD-10-CM | POA: Diagnosis not present

## 2011-03-02 DIAGNOSIS — H35379 Puckering of macula, unspecified eye: Secondary | ICD-10-CM | POA: Diagnosis not present

## 2011-04-10 DIAGNOSIS — E1129 Type 2 diabetes mellitus with other diabetic kidney complication: Secondary | ICD-10-CM | POA: Diagnosis not present

## 2011-04-10 DIAGNOSIS — N189 Chronic kidney disease, unspecified: Secondary | ICD-10-CM | POA: Diagnosis not present

## 2011-04-10 DIAGNOSIS — E782 Mixed hyperlipidemia: Secondary | ICD-10-CM | POA: Diagnosis not present

## 2011-04-20 DIAGNOSIS — M169 Osteoarthritis of hip, unspecified: Secondary | ICD-10-CM | POA: Diagnosis not present

## 2011-05-19 ENCOUNTER — Other Ambulatory Visit: Payer: Self-pay | Admitting: Oncology

## 2011-05-19 DIAGNOSIS — D45 Polycythemia vera: Secondary | ICD-10-CM

## 2011-05-22 ENCOUNTER — Ambulatory Visit: Payer: PRIVATE HEALTH INSURANCE | Admitting: Oncology

## 2011-05-22 ENCOUNTER — Other Ambulatory Visit (HOSPITAL_BASED_OUTPATIENT_CLINIC_OR_DEPARTMENT_OTHER): Payer: Medicare Other

## 2011-05-22 ENCOUNTER — Ambulatory Visit (HOSPITAL_BASED_OUTPATIENT_CLINIC_OR_DEPARTMENT_OTHER): Payer: Medicare Other | Admitting: Oncology

## 2011-05-22 ENCOUNTER — Telehealth: Payer: Self-pay | Admitting: Oncology

## 2011-05-22 ENCOUNTER — Ambulatory Visit: Payer: PRIVATE HEALTH INSURANCE

## 2011-05-22 ENCOUNTER — Ambulatory Visit: Payer: PRIVATE HEALTH INSURANCE | Admitting: Physician Assistant

## 2011-05-22 ENCOUNTER — Encounter: Payer: Self-pay | Admitting: Oncology

## 2011-05-22 ENCOUNTER — Other Ambulatory Visit: Payer: PRIVATE HEALTH INSURANCE | Admitting: Lab

## 2011-05-22 VITALS — BP 119/66 | HR 60 | Temp 97.9°F | Ht 61.0 in | Wt 180.0 lb

## 2011-05-22 DIAGNOSIS — C61 Malignant neoplasm of prostate: Secondary | ICD-10-CM

## 2011-05-22 DIAGNOSIS — D696 Thrombocytopenia, unspecified: Secondary | ICD-10-CM

## 2011-05-22 DIAGNOSIS — D45 Polycythemia vera: Secondary | ICD-10-CM | POA: Diagnosis not present

## 2011-05-22 LAB — CBC WITH DIFFERENTIAL/PLATELET
Basophils Absolute: 0.1 10*3/uL (ref 0.0–0.1)
EOS%: 3.1 % (ref 0.0–7.0)
HCT: 43.2 % (ref 38.4–49.9)
HGB: 14 g/dL (ref 13.0–17.1)
LYMPH%: 29.1 % (ref 14.0–49.0)
MCH: 25.2 pg — ABNORMAL LOW (ref 27.2–33.4)
MONO#: 0.4 10*3/uL (ref 0.1–0.9)
NEUT%: 58.8 % (ref 39.0–75.0)
Platelets: 98 10*3/uL — ABNORMAL LOW (ref 140–400)
lymph#: 1.6 10*3/uL (ref 0.9–3.3)

## 2011-05-22 LAB — COMPREHENSIVE METABOLIC PANEL
Alkaline Phosphatase: 43 U/L (ref 39–117)
CO2: 26 mEq/L (ref 19–32)
Creatinine, Ser: 1.76 mg/dL — ABNORMAL HIGH (ref 0.50–1.35)
Glucose, Bld: 189 mg/dL — ABNORMAL HIGH (ref 70–99)
Total Bilirubin: 0.6 mg/dL (ref 0.3–1.2)

## 2011-05-22 LAB — LACTATE DEHYDROGENASE: LDH: 153 U/L (ref 94–250)

## 2011-05-22 NOTE — Progress Notes (Signed)
This office note has been dictated.  #[id number]

## 2011-05-22 NOTE — Telephone Encounter (Signed)
Gv pt appt for [id number] 

## 2011-05-22 NOTE — Progress Notes (Signed)
CC:   Aaron Stephenson) [name], M.D. Ronald L. Earlene Plater, M.D.  PROBLEM LIST: 1. Polycythemia vera dating back to August 1987.  JAK2 mutation was     not detected.  Cutoff for phlebotomy is currently 45%.  The     patient's most recent phlebotomy 1 unit was carried out on     08/25/2010 when his hematocrit was 45.9. The patient's hemoglobin had been 18.9 and hematocrit 57.1 dating back to August 1997. 2. Thrombocytopenia most likely due to immune dysregulation. 3. Renal insufficiency. 4. Diabetes mellitus type 2. 5. Hypertension. 6. Osteoarthritis particularly involving the hands. 7. Elevated PSA followed by Dr. Darvin Neighbours.  The patient had negative     biopsy in September 2003.  Biopsy in August 2006 may having shown     PIN. 8. Positive family history for prostate cancer in 2 of the patient's     brothers. 9. Tumor involving left lower legs, resected on 11/02/2005 showing a     benign cavernous hemangioma.  Surgery was carried out by Dr.     Romana Juniper at [facility].     Margins were free. 10.Right total hip replacement in 1993 with several subsequent     surgeries. 11.Right renal cyst status post aspiration.  No malignant cells were     identified.  Aspiration was carried out on 01/14/2010. 12.Multiple liver lesions seen on CT scanning going back to August     2007, most likely liver cysts felt to be benign. 13.Dyslipidemia.  MEDICATIONS: 1. Xanax 0.5 mg. 2. Vitamin C 1000 mg daily. 3. Aspirin 325 mg daily. 4. Os-Cal 600 mg twice daily. 5. Cardura 4 mg at bedtime. 6. Unisom 25 mg at bedtime as needed. 7. Avodart 0.5 mg daily. 8. Nexium 40 mg daily. 9. Fish Oil omega-3 fatty acids 600 mg twice daily. 10.Glucosamine-chondroitin 500-400 one tablet daily. 11.Microzide 12.5 mg daily. 12.Lisinopril 5 mg daily. 13.Multivitamins with minerals 1 daily. 14.Pravachol 20 mg daily. 15.Ultram 50 mg every 6 hours as needed. 16.Vitamin E 400 mg  daily.  HISTORY:  Jamail Cullers was seen today for followup of his polycythemia vera.  JAK2 mutation was not detected back in March 2007. Mr. Hortman was last seen by [place] on 11/21/2010.  He is now 78 years old and continues to do well.  There have not been any health issues over the past 6 months.  The patient is really without any new complaints. He continues to have back pain, osteoarthritis and some fatigue.  He remains active; he swims and plays golf.  His last 1 unit phlebotomy took place on 08/25/2010 and prior to that 11/23/2009.  The patient is averaging a phlebotomy about every 6-12 months.  In looking over Mr. Hlad's chart apparently at one time his hemoglobin had been 18.9, hematocrit 57.1 dating back to August 1997.  There was no obvious explanation for that.  PHYSICAL EXAMINATION:  Mr. Blue looks well.  Weight is 180 pounds. Height 5 feet 1 inch, body surface area 1.87 meters squared.  Blood pressure 119/66.  Other vital signs are normal.  There is no scleral icterus.  He does wear glasses.  Mouth and pharynx are benign.  No peripheral adenopathy palpable.  Heart and lungs were normal.  Abdomen is benign with no organomegaly, masses palpable.  He does have a diastasis recti.  Extremities slightly puffy in the lower legs with no peripheral edema or clubbing.  Neurologic exam is normal.  He does have osteoarthritis with  deformity involving his fingers.  LABORATORY DATA:  Today, white count 5.4, ANC 3.2, hemoglobin 14.0, hematocrit 43.2, platelets 98,000.  On 02/21/2011 hemoglobin was 13.9, hematocrit 42.8, platelets 107,000.  On 11/21/2010 hemoglobin was 14.1, hematocrit 43.0 and platelets were 119,000.  Chemistries today are pending.  Chemistries from 11/21/2010 were notable for a BUN of 28, creatinine 1.70.  The patient does have renal insufficiency.  Glucose was 185.  IMAGING STUDIES: 1. CT scan of abdomen and pelvis on 11/15/2005 showed significantly      enlarged prostate with a degree of bladder outlet obstruction.     There was some atrophy of the right ileus psoas musculature, right     proximal thigh musculature relative to the left associated with a     right total hip replacement.  There were multiple hepatic cysts.     There were bilateral renal cysts.  There was a small exophytic     lesion in the upper pole of the right kidney which was     indeterminate.  Ultrasound or MRI was recommended for further     assessment. 2. Renal urinary tract ultrasound on 11/24/2005 showed tiny right     upper pole renal lesion, not well visualized by ultrasound.     Consider further CT scans or dedicated renal MR.  Bilateral renal     and hepatic cysts were described. 3. CT of abdomen and pelvis without IV contrast on 12/28/2009  showed     a 16 cm right renal cyst increased from 8.6 cm on the prior study     with noncontrast imaging characteristics of a simple cyst.  There     was no ascites or free air.  Unremarkable uninfused evaluation of     spleen, adrenal glands, gallbladder.  There were multiple low     attenuation hepatic lesions which are stable in size, smaller     compared with the prior study, probably benign cyst or possibly     biliary hamartomas. 4. Ultrasound guided aspiration of right renal cyst on 01/14/2010.     Giant right renal cyst identified measuring over 15 cm.  Underwent     aspiration, yielded clear fluid with a total volume of 1400 mL     removed.  This resulted in complete collapse of the cyst by     ultrasound.  Cytology showed no malignant cells.  IMPRESSION AND PLAN:  Mr. Clinkscale continues to do quite well.  He will be turning 78 this summer.  He remains active.  His hemoglobin and hematocrit are well controlled with phlebotomies occurring about every 6- 10/12 months.  Our target hematocrit is about 45%.  We will plan to check a CBC in 3 months and see Mr. Pescador again in 6 months for reassessment.  CBC and  chemistries have been ordered as well as phlebotomies if needed for 3 months and 6 months from now.    ______________________________ Samul Dada, M.D. DSM/MEDQ  D:  05/22/2011  T:  05/22/2011  Job:  [id number]

## 2011-05-25 DIAGNOSIS — I10 Essential (primary) hypertension: Secondary | ICD-10-CM | POA: Diagnosis not present

## 2011-05-25 DIAGNOSIS — D509 Iron deficiency anemia, unspecified: Secondary | ICD-10-CM | POA: Diagnosis not present

## 2011-05-25 DIAGNOSIS — E7800 Pure hypercholesterolemia, unspecified: Secondary | ICD-10-CM | POA: Diagnosis not present

## 2011-06-05 DIAGNOSIS — C61 Malignant neoplasm of prostate: Secondary | ICD-10-CM | POA: Diagnosis not present

## 2011-07-03 DIAGNOSIS — G4700 Insomnia, unspecified: Secondary | ICD-10-CM | POA: Diagnosis not present

## 2011-07-03 DIAGNOSIS — E119 Type 2 diabetes mellitus without complications: Secondary | ICD-10-CM | POA: Diagnosis not present

## 2011-07-11 DIAGNOSIS — M169 Osteoarthritis of hip, unspecified: Secondary | ICD-10-CM | POA: Diagnosis not present

## 2011-08-21 ENCOUNTER — Encounter: Payer: Self-pay | Admitting: Oncology

## 2011-08-21 ENCOUNTER — Ambulatory Visit (HOSPITAL_BASED_OUTPATIENT_CLINIC_OR_DEPARTMENT_OTHER): Payer: Medicare Other

## 2011-08-21 ENCOUNTER — Other Ambulatory Visit (HOSPITAL_BASED_OUTPATIENT_CLINIC_OR_DEPARTMENT_OTHER): Payer: Medicare Other | Admitting: Lab

## 2011-08-21 VITALS — BP 120/68 | HR 57 | Temp 97.2°F

## 2011-08-21 DIAGNOSIS — N2581 Secondary hyperparathyroidism of renal origin: Secondary | ICD-10-CM | POA: Diagnosis not present

## 2011-08-21 DIAGNOSIS — D45 Polycythemia vera: Secondary | ICD-10-CM | POA: Diagnosis not present

## 2011-08-21 DIAGNOSIS — C61 Malignant neoplasm of prostate: Secondary | ICD-10-CM | POA: Diagnosis not present

## 2011-08-21 DIAGNOSIS — N189 Chronic kidney disease, unspecified: Secondary | ICD-10-CM | POA: Diagnosis not present

## 2011-08-21 DIAGNOSIS — D509 Iron deficiency anemia, unspecified: Secondary | ICD-10-CM | POA: Diagnosis not present

## 2011-08-21 DIAGNOSIS — N390 Urinary tract infection, site not specified: Secondary | ICD-10-CM | POA: Diagnosis not present

## 2011-08-21 DIAGNOSIS — D649 Anemia, unspecified: Secondary | ICD-10-CM | POA: Diagnosis not present

## 2011-08-21 LAB — CBC WITH DIFFERENTIAL/PLATELET
BASO%: 0.8 % (ref 0.0–2.0)
Basophils Absolute: 0 10*3/uL (ref 0.0–0.1)
Eosinophils Absolute: 0.1 10*3/uL (ref 0.0–0.5)
HCT: 45.5 % (ref 38.4–49.9)
HGB: 14.7 g/dL (ref 13.0–17.1)
MONO#: 0.3 10*3/uL (ref 0.1–0.9)
NEUT#: 2.8 10*3/uL (ref 1.5–6.5)
NEUT%: 59.5 % (ref 39.0–75.0)
Platelets: 103 10*3/uL — ABNORMAL LOW (ref 140–400)
WBC: 4.8 10*3/uL (ref 4.0–10.3)
lymph#: 1.4 10*3/uL (ref 0.9–3.3)

## 2011-08-21 NOTE — Patient Instructions (Signed)
Patient aware of next appointment; discharged home with no complaints. 

## 2011-08-21 NOTE — Progress Notes (Signed)
Patient underwent a 1 unit phlebotomy today for a Hct of 45.5%.  Our cut-off has been 45%.  Most recent phlebotomy was 08/25/2010, I believe.

## 2011-08-21 NOTE — Progress Notes (Signed)
Removed 500cc blood via phlebotomy; pre and post vital signs stable; patient tolerated well.

## 2011-08-23 DIAGNOSIS — L570 Actinic keratosis: Secondary | ICD-10-CM | POA: Diagnosis not present

## 2011-08-23 DIAGNOSIS — Z85828 Personal history of other malignant neoplasm of skin: Secondary | ICD-10-CM | POA: Diagnosis not present

## 2011-08-23 DIAGNOSIS — L821 Other seborrheic keratosis: Secondary | ICD-10-CM | POA: Diagnosis not present

## 2011-09-14 DIAGNOSIS — M47817 Spondylosis without myelopathy or radiculopathy, lumbosacral region: Secondary | ICD-10-CM | POA: Diagnosis not present

## 2011-09-14 DIAGNOSIS — M5137 Other intervertebral disc degeneration, lumbosacral region: Secondary | ICD-10-CM | POA: Diagnosis not present

## 2011-09-21 DIAGNOSIS — M25519 Pain in unspecified shoulder: Secondary | ICD-10-CM | POA: Diagnosis not present

## 2011-10-02 DIAGNOSIS — Z23 Encounter for immunization: Secondary | ICD-10-CM | POA: Diagnosis not present

## 2011-10-04 DIAGNOSIS — E119 Type 2 diabetes mellitus without complications: Secondary | ICD-10-CM | POA: Diagnosis not present

## 2011-11-02 ENCOUNTER — Encounter: Payer: Self-pay | Admitting: Oncology

## 2011-11-09 ENCOUNTER — Other Ambulatory Visit: Payer: Self-pay | Admitting: Dermatology

## 2011-11-09 DIAGNOSIS — D485 Neoplasm of uncertain behavior of skin: Secondary | ICD-10-CM | POA: Diagnosis not present

## 2011-11-09 DIAGNOSIS — L570 Actinic keratosis: Secondary | ICD-10-CM | POA: Diagnosis not present

## 2011-11-14 DIAGNOSIS — E1129 Type 2 diabetes mellitus with other diabetic kidney complication: Secondary | ICD-10-CM | POA: Diagnosis not present

## 2011-11-14 DIAGNOSIS — I10 Essential (primary) hypertension: Secondary | ICD-10-CM | POA: Diagnosis not present

## 2011-11-14 DIAGNOSIS — D509 Iron deficiency anemia, unspecified: Secondary | ICD-10-CM | POA: Diagnosis not present

## 2011-11-21 ENCOUNTER — Other Ambulatory Visit (HOSPITAL_BASED_OUTPATIENT_CLINIC_OR_DEPARTMENT_OTHER): Payer: Medicare Other | Admitting: Lab

## 2011-11-21 ENCOUNTER — Telehealth: Payer: Self-pay | Admitting: *Deleted

## 2011-11-21 ENCOUNTER — Encounter: Payer: Self-pay | Admitting: Family

## 2011-11-21 ENCOUNTER — Telehealth: Payer: Self-pay | Admitting: Oncology

## 2011-11-21 ENCOUNTER — Ambulatory Visit: Payer: Medicare Other | Admitting: Oncology

## 2011-11-21 ENCOUNTER — Ambulatory Visit (HOSPITAL_BASED_OUTPATIENT_CLINIC_OR_DEPARTMENT_OTHER): Payer: Medicare Other | Admitting: Family

## 2011-11-21 VITALS — BP 129/62 | HR 64 | Temp 97.0°F | Resp 20 | Ht 61.0 in | Wt 181.2 lb

## 2011-11-21 DIAGNOSIS — Z8042 Family history of malignant neoplasm of prostate: Secondary | ICD-10-CM

## 2011-11-21 DIAGNOSIS — N289 Disorder of kidney and ureter, unspecified: Secondary | ICD-10-CM

## 2011-11-21 DIAGNOSIS — M19049 Primary osteoarthritis, unspecified hand: Secondary | ICD-10-CM

## 2011-11-21 DIAGNOSIS — D45 Polycythemia vera: Secondary | ICD-10-CM | POA: Diagnosis not present

## 2011-11-21 LAB — COMPREHENSIVE METABOLIC PANEL (CC13)
AST: 18 U/L (ref 5–34)
Albumin: 3.4 g/dL — ABNORMAL LOW (ref 3.5–5.0)
Alkaline Phosphatase: 44 U/L (ref 40–150)
BUN: 28 mg/dL — ABNORMAL HIGH (ref 7.0–26.0)
Creatinine: 1.7 mg/dL — ABNORMAL HIGH (ref 0.7–1.3)
Glucose: 186 mg/dl — ABNORMAL HIGH (ref 70–99)
Potassium: 4.7 mEq/L (ref 3.5–5.1)
Total Bilirubin: 0.6 mg/dL (ref 0.20–1.20)

## 2011-11-21 LAB — CBC WITH DIFFERENTIAL/PLATELET
Basophils Absolute: 0.1 10*3/uL (ref 0.0–0.1)
EOS%: 2.9 % (ref 0.0–7.0)
HCT: 43 % (ref 38.4–49.9)
HGB: 14 g/dL (ref 13.0–17.1)
LYMPH%: 29.6 % (ref 14.0–49.0)
MCH: 24.8 pg — ABNORMAL LOW (ref 27.2–33.4)
MCV: 76.3 fL — ABNORMAL LOW (ref 79.3–98.0)
NEUT%: 58.9 % (ref 39.0–75.0)
Platelets: 107 10*3/uL — ABNORMAL LOW (ref 140–400)
lymph#: 1.5 10*3/uL (ref 0.9–3.3)

## 2011-11-21 NOTE — Progress Notes (Signed)
Patient ID: Aaron Stephenson, male   DOB: 12-22-1924, 78 y.o.   MRN: [id number] CSN: [id number]  CC: Aaron Stephenson (C.Alan) Aaron Craw, MD  Aaron Stephenson. Aaron Plater, MD Aaron Estelle, MD   Problem List: Aaron Stephenson is a 60 y.o. Caucasian male with a problem list consisting of:  1. Polycythemia vera dating back to August 1987. JAK2 mutation was not detected. Cutoff for phlebotomy is currently 45%. The patient's most recent phlebotomy 1 unit was carried out on 08/21/2011 when his hematocrit was 45.5. The patient's hemoglobin had been 18.9 and hematocrit 57.1 dating back to August 1997.  2. Thrombocytopenia most likely due to immune dysregulation 3. Renal insufficiency  4. Diabetes mellitus type 2 5. Hypertension 6. Osteoarthritis particularly involving the hands  7. Elevated PSA followed by Dr. Darvin Neighbours. The patient had negative biopsy in September 2003. Biopsy in August 2006 may having shown  PIN  8. Positive family history for prostate cancer in 2 of the patient's brothers 74. Tumor involving left lower legs, resected on 11/02/2005 showing a benign cavernous hemangioma. Surgery was carried out by Dr. Romana Juniper at [facility]. Margins were free.  10.Right total hip replacement in 1993 with several subsequent surgeries 11.Right renal cyst status post aspiration. No malignant cells were identified. Aspiration was carried out on 01/14/2010 12.Multiple liver lesions seen on CT scanning going back to August 2007, most likely liver cysts felt to be benign 13.Dyslipidemia.  Mr. Aaron Stephenson was seen by Dr. Arline Asp and I  today for follow up of his polycythemia vera. JAK2 mutation was not detected back in March 2007. Aaron Stephenson was last seen by [place] on 05/22/2011. He is now 78 years old and continues to do well. There have not been any health issues over the past 6 months. The patient is without any new complaints. He has chronic back pain and osteoarthritis. He remains active  by swimming and golfing.  His last 1 unit phlebotomy took place on 08/21/2011 and prior to that 08/25/2010. The patient is averaging a phlebotomy about every 6-12 months. In looking over Aaron Stephenson's chart apparently at one time his hemoglobin had been 18.9, hematocrit 57.1 dating back to August 1997. There was no obvious explanation for that.  The patient denies any fever, chills, SOB, N/V/D or constipation, night sweats and unusual bleeding.  The patient has received the influenza vaccination.   Past Medical History: Past Medical History  Diagnosis Date  . Polycythemia vera 02/20/2011  . DM (diabetes mellitus), type 2   . HTN (hypertension)   . OA (osteoarthritis)   . Hyperlipidemia   . Esophagitis   . GERD (gastroesophageal reflux disease)   . Hiatal hernia   . Migraine   . Vertigo   . Thrombocytopenia   . Myeloproliferative disease   . Spinal stenosis, lumbar     Surgical History: Past Surgical History  Procedure Date  . Total hip arthroplasty 1993    right    Current Medications: Current Outpatient Prescriptions  Medication Sig Dispense Refill  . ALPRAZolam (XANAX) 0.5 MG tablet Take 0.5 mg by mouth daily.      . Ascorbic Acid (VITAMIN C) 1000 MG tablet Take 1,000 mg by mouth daily.      Marland Kitchen aspirin 325 MG EC tablet Take 325 mg by mouth daily.      . calcium carbonate (OS-CAL) 600 MG TABS Take 600 mg by mouth 2 (two) times daily with a meal.      .  doxazosin (CARDURA) 4 MG tablet Take 4 mg by mouth at bedtime.      Marland Kitchen doxylamine, Sleep, (UNISOM) 25 MG tablet Take 25 mg by mouth at bedtime as needed.      . dutasteride (AVODART) 0.5 MG capsule Take 0.5 mg by mouth daily.      Marland Kitchen esomeprazole (NEXIUM) 40 MG capsule Take 40 mg by mouth daily before breakfast.      . fish oil-omega-3 fatty acids 1000 MG capsule Take 600 mg by mouth 2 (two) times daily.      Marland Kitchen glipiZIDE (GLUCOTROL XL) 5 MG 24 hr tablet Take 5 mg by mouth daily.      Marland Kitchen glucosamine-chondroitin 500-400 MG tablet Take  1 tablet by mouth as needed.      . hydrochlorothiazide (MICROZIDE) 12.5 MG capsule Take 12.5 mg by mouth daily. 1/2 tab po bid      . lisinopril (PRINIVIL,ZESTRIL) 5 MG tablet Take 5 mg by mouth daily.      . metoprolol (LOPRESSOR) 50 MG tablet Take 25 mg by mouth 2 (two) times daily.      . Multiple Vitamins-Minerals (MULTIVITAMIN WITH MINERALS) tablet Take 1 tablet by mouth daily.      . pravastatin (PRAVACHOL) 20 MG tablet Take 20 mg by mouth daily.      . traMADol (ULTRAM) 50 MG tablet Take 50 mg by mouth 2 (two) times daily.       . vitamin E (VITAMIN E) 400 UNIT capsule Take 400 Units by mouth daily.        Allergies: Allergies  Allergen Reactions  . Penicillins Hives and Swelling    Family History: No family history on file.  Social History: History  Substance Use Topics  . Smoking status: Never Smoker   . Smokeless tobacco: Never Used  . Alcohol Use: Yes     rarely    Review of Systems: 10 Point review of systems was completed and is negative except as noted above.   Physical Exam:   Blood pressure 129/62, pulse 64, temperature 97 F (36.1 C), temperature source Oral, resp. rate 20, height 5\' 1"  (1.549 m), weight 181 lb 3.2 oz (82.192 kg).  General appearance: Alert, cooperative, well nourished, no apparent distress Head: Normocephalic, without obvious abnormality, atraumatic, numerous areas of hyperpigmentation Eyes: Conjunctivae/corneas clear, PERRLA, EOMI, arcus senilis Nose: Nares, septum and mucosa are normal, excessive cilia, no drainage or sinus tenderness Neck: No adenopathy, supple, symmetrical, trachea midline, thyroid not enlarged, no tenderness Resp: Clear to auscultation bilaterally Cardio: Regular rate and rhythm, S1, S2 normal, no murmur, click, rub or gallop GI: Soft, distended, non-tender, hypoactive bowel sounds, no organomegaly Extremities: Extremities normal, atraumatic, no cyanosis or edema Lymph nodes: Cervical, supraclavicular, and axillary  nodes normal Neurologic: Grossly normal   Laboratory Data: Results for orders placed in visit on 11/21/11 (from the past 48 hour(s))  CBC WITH DIFFERENTIAL     Status: Abnormal   Collection Time   11/21/11  9:47 AM      Component Value Range Comment   WBC 5.0  4.0 - 10.3 10e3/uL    NEUT# 2.9  1.5 - 6.5 10e3/uL    HGB 14.0  13.0 - 17.1 g/dL    HCT 16.1  09.6 - 04.5 %    Platelets 107 (*) 140 - 400 10e3/uL    MCV 76.3 (*) 79.3 - 98.0 fL    MCH 24.8 (*) 27.2 - 33.4 pg    MCHC 32.5  32.0 - 36.0  g/dL    RBC 1.61  0.96 - 0.45 10e6/uL    RDW 15.4 (*) 11.0 - 14.6 %    lymph# 1.5  0.9 - 3.3 10e3/uL    MONO# 0.4  0.1 - 0.9 10e3/uL    Eosinophils Absolute 0.1  0.0 - 0.5 10e3/uL    Basophils Absolute 0.1  0.0 - 0.1 10e3/uL    NEUT% 58.9  39.0 - 75.0 %    LYMPH% 29.6  14.0 - 49.0 %    MONO% 7.2  0.0 - 14.0 %    EOS% 2.9  0.0 - 7.0 %    BASO% 1.4  0.0 - 2.0 %   COMPREHENSIVE METABOLIC PANEL (CC13)     Status: Abnormal   Collection Time   11/21/11  9:47 AM      Component Value Range Comment   Sodium 138  136 - 145 mEq/L    Potassium 4.7  3.5 - 5.1 mEq/L    Chloride 107  98 - 107 mEq/L    CO2 22  22 - 29 mEq/L    Glucose 186 (*) 70 - 99 mg/dl    BUN 40.9 (*) 7.0 - 81.1 mg/dL    Creatinine 1.7 (*) 0.7 - 1.3 mg/dL    Total Bilirubin 9.14  0.20 - 1.20 mg/dL    Alkaline Phosphatase 44  40 - 150 U/L    AST 18  5 - 34 U/L    ALT 14  0 - 55 U/L    Total Protein 6.5  6.4 - 8.3 g/dL    Albumin 3.4 (*) 3.5 - 5.0 g/dL    Calcium 9.3  8.4 - 78.2 mg/dL   LACTATE DEHYDROGENASE (CC13)     Status: Normal   Collection Time   11/21/11  9:47 AM      Component Value Range Comment   LDH 173  125 - 220 U/L      Imaging Studies: 1. CT scan of abdomen and pelvis on 11/15/2005 showed significantly enlarged prostate with a degree of bladder outlet obstruction. There was some atrophy of the right ileus psoas musculature, right proximal thigh musculature relative to the left associated with a right total  hip replacement. There were multiple hepatic cysts. There were bilateral renal cysts. There was a small exophytic lesion in the upper pole of the right kidney which was indeterminate. Ultrasound or MRI was recommended for further assessment.  2. Renal urinary tract ultrasound on 11/24/2005 showed tiny right upper pole renal lesion, not well visualized by ultrasound. Consider further CT scans or dedicated renal MR. Bilateral renal and hepatic cysts were described.  3. CT of abdomen and pelvis without IV contrast on 12/28/2009 showed a 16 cm right renal cyst increased from 8.6 cm on the prior study  with noncontrast imaging characteristics of a simple cyst. There was no ascites or free air. Unremarkable uninfused evaluation of spleen, adrenal glands, gallbladder. There were multiple low attenuation hepatic lesions which are stable in size, smaller compared with the prior study, probably benign cyst or possibly biliary hamartomas.  4. Ultrasound guided aspiration of right renal cyst on 01/14/2010. Giant right renal cyst identified measuring over 15 cm. Underwent aspiration, yielded clear fluid with a total volume of 1400 mL removed. This resulted in complete collapse of the cyst by ultrasound. Cytology showed no malignant cells.   Impression/Plan: Mr. Lewallen continues to do quite well. He remains active. His hemoglobin and hematocrit are well controlled with phlebotomies occurring about every 6- 10/12 months.  Our target hematocrit is about 45%.  The patient will receive a 1 unit phlebotomy if his hematocrit is greater than or equal to 45%.  We will plan to check a CBC in 3 months and see Mr. Derrington again in 6 months for reassessment. CBC and chemistries have been ordered as well as phlebotomies if needed for 3 months and 6 months from now.    Larina Bras, NP-C 11/21/2011, 10:00 AM

## 2011-11-21 NOTE — Patient Instructions (Addendum)
Keep up the great work!

## 2011-11-21 NOTE — Telephone Encounter (Signed)
Per staff message I have scheduled appts.  JW  

## 2011-11-21 NOTE — Telephone Encounter (Signed)
appts made and printed for pt pt aware that the phlebot will be added,email to mw    aom

## 2011-12-07 DIAGNOSIS — I10 Essential (primary) hypertension: Secondary | ICD-10-CM | POA: Diagnosis not present

## 2011-12-07 DIAGNOSIS — D509 Iron deficiency anemia, unspecified: Secondary | ICD-10-CM | POA: Diagnosis not present

## 2011-12-07 DIAGNOSIS — N2581 Secondary hyperparathyroidism of renal origin: Secondary | ICD-10-CM | POA: Diagnosis not present

## 2011-12-11 DIAGNOSIS — R3915 Urgency of urination: Secondary | ICD-10-CM | POA: Diagnosis not present

## 2011-12-11 DIAGNOSIS — J209 Acute bronchitis, unspecified: Secondary | ICD-10-CM | POA: Diagnosis not present

## 2011-12-25 DIAGNOSIS — C61 Malignant neoplasm of prostate: Secondary | ICD-10-CM | POA: Diagnosis not present

## 2011-12-25 DIAGNOSIS — Q619 Cystic kidney disease, unspecified: Secondary | ICD-10-CM | POA: Diagnosis not present

## 2011-12-25 DIAGNOSIS — N320 Bladder-neck obstruction: Secondary | ICD-10-CM | POA: Diagnosis not present

## 2011-12-25 DIAGNOSIS — N289 Disorder of kidney and ureter, unspecified: Secondary | ICD-10-CM | POA: Diagnosis not present

## 2012-02-19 DIAGNOSIS — H40019 Open angle with borderline findings, low risk, unspecified eye: Secondary | ICD-10-CM | POA: Diagnosis not present

## 2012-02-20 ENCOUNTER — Other Ambulatory Visit: Payer: Medicare Other | Admitting: Lab

## 2012-02-22 ENCOUNTER — Other Ambulatory Visit (HOSPITAL_BASED_OUTPATIENT_CLINIC_OR_DEPARTMENT_OTHER): Payer: Medicare Other | Admitting: Lab

## 2012-02-22 ENCOUNTER — Ambulatory Visit: Payer: Medicare Other

## 2012-02-22 DIAGNOSIS — D45 Polycythemia vera: Secondary | ICD-10-CM | POA: Diagnosis not present

## 2012-02-22 LAB — CBC WITH DIFFERENTIAL/PLATELET
BASO%: 0.8 % (ref 0.0–2.0)
EOS%: 2.4 % (ref 0.0–7.0)
HCT: 43.4 % (ref 38.4–49.9)
HGB: 14 g/dL (ref 13.0–17.1)
LYMPH%: 33.5 % (ref 14.0–49.0)
MCH: 24.8 pg — ABNORMAL LOW (ref 27.2–33.4)
MCHC: 32.3 g/dL (ref 32.0–36.0)
MCV: 77 fL — ABNORMAL LOW (ref 79.3–98.0)
MONO#: 0.5 10*3/uL (ref 0.1–0.9)
NEUT#: 3.3 10*3/uL (ref 1.5–6.5)
RBC: 5.64 10*6/uL (ref 4.20–5.82)
lymph#: 2 10*3/uL (ref 0.9–3.3)
nRBC: 0 % (ref 0–0)

## 2012-02-22 NOTE — Progress Notes (Signed)
Hct 43.4  No need for phlebotomy today. Spoke with patient in lobby and copy of labs given to patient.

## 2012-02-28 DIAGNOSIS — D509 Iron deficiency anemia, unspecified: Secondary | ICD-10-CM | POA: Diagnosis not present

## 2012-02-28 DIAGNOSIS — Z85828 Personal history of other malignant neoplasm of skin: Secondary | ICD-10-CM | POA: Diagnosis not present

## 2012-02-28 DIAGNOSIS — I10 Essential (primary) hypertension: Secondary | ICD-10-CM | POA: Diagnosis not present

## 2012-02-28 DIAGNOSIS — L821 Other seborrheic keratosis: Secondary | ICD-10-CM | POA: Diagnosis not present

## 2012-02-28 DIAGNOSIS — L570 Actinic keratosis: Secondary | ICD-10-CM | POA: Diagnosis not present

## 2012-02-28 DIAGNOSIS — E1129 Type 2 diabetes mellitus with other diabetic kidney complication: Secondary | ICD-10-CM | POA: Diagnosis not present

## 2012-03-25 DIAGNOSIS — C61 Malignant neoplasm of prostate: Secondary | ICD-10-CM | POA: Diagnosis not present

## 2012-03-25 DIAGNOSIS — R972 Elevated prostate specific antigen [PSA]: Secondary | ICD-10-CM | POA: Diagnosis not present

## 2012-04-09 DIAGNOSIS — E1129 Type 2 diabetes mellitus with other diabetic kidney complication: Secondary | ICD-10-CM | POA: Diagnosis not present

## 2012-04-09 DIAGNOSIS — N189 Chronic kidney disease, unspecified: Secondary | ICD-10-CM | POA: Diagnosis not present

## 2012-04-09 DIAGNOSIS — Z0000 Encounter for general adult medical examination without abnormal findings: Secondary | ICD-10-CM | POA: Diagnosis not present

## 2012-04-09 DIAGNOSIS — E782 Mixed hyperlipidemia: Secondary | ICD-10-CM | POA: Diagnosis not present

## 2012-05-21 ENCOUNTER — Other Ambulatory Visit (HOSPITAL_BASED_OUTPATIENT_CLINIC_OR_DEPARTMENT_OTHER): Payer: Medicare Other

## 2012-05-21 ENCOUNTER — Ambulatory Visit (HOSPITAL_BASED_OUTPATIENT_CLINIC_OR_DEPARTMENT_OTHER): Payer: Medicare Other

## 2012-05-21 ENCOUNTER — Telehealth: Payer: Self-pay | Admitting: Oncology

## 2012-05-21 ENCOUNTER — Encounter: Payer: Self-pay | Admitting: Oncology

## 2012-05-21 ENCOUNTER — Ambulatory Visit (HOSPITAL_BASED_OUTPATIENT_CLINIC_OR_DEPARTMENT_OTHER): Payer: Medicare Other | Admitting: Oncology

## 2012-05-21 VITALS — BP 136/65 | HR 59 | Temp 97.1°F | Resp 18 | Ht 61.0 in | Wt 177.9 lb

## 2012-05-21 DIAGNOSIS — D45 Polycythemia vera: Secondary | ICD-10-CM

## 2012-05-21 DIAGNOSIS — D649 Anemia, unspecified: Secondary | ICD-10-CM | POA: Diagnosis not present

## 2012-05-21 LAB — CBC WITH DIFFERENTIAL/PLATELET
BASO%: 0.6 % (ref 0.0–2.0)
EOS%: 2.8 % (ref 0.0–7.0)
LYMPH%: 30.9 % (ref 14.0–49.0)
MCH: 24.5 pg — ABNORMAL LOW (ref 27.2–33.4)
MCHC: 31.9 g/dL — ABNORMAL LOW (ref 32.0–36.0)
MCV: 76.7 fL — ABNORMAL LOW (ref 79.3–98.0)
MONO%: 7.8 % (ref 0.0–14.0)
Platelets: 103 10*3/uL — ABNORMAL LOW (ref 140–400)
RBC: 6.05 10*6/uL — ABNORMAL HIGH (ref 4.20–5.82)
WBC: 5 10*3/uL (ref 4.0–10.3)
nRBC: 0 % (ref 0–0)

## 2012-05-21 LAB — COMPREHENSIVE METABOLIC PANEL (CC13)
Alkaline Phosphatase: 47 U/L (ref 40–150)
BUN: 28.6 mg/dL — ABNORMAL HIGH (ref 7.0–26.0)
CO2: 25 mEq/L (ref 22–29)
Glucose: 164 mg/dl — ABNORMAL HIGH (ref 70–99)
Total Bilirubin: 0.66 mg/dL (ref 0.20–1.20)

## 2012-05-21 NOTE — Progress Notes (Signed)
This office note has been dictated.  #[id number]

## 2012-05-21 NOTE — Progress Notes (Signed)
Phlebotomy of 375 cc taken from [facility] with 16g phlebotomy set. Pt refused 2nd stick. Time from 115-120 pm. Snack taken. 20 minutes observation. Tolerated well.

## 2012-05-21 NOTE — Patient Instructions (Addendum)

## 2012-05-22 ENCOUNTER — Telehealth: Payer: Self-pay | Admitting: *Deleted

## 2012-05-22 NOTE — Telephone Encounter (Signed)
Per staff message and POF I have scheduled appts.  JMW  

## 2012-05-22 NOTE — Progress Notes (Signed)
CC:   Aaron Stephenson) Aaron Stephenson, M.D. Aaron Stephenson, M.D.  PROBLEM LIST:  1. Polycythemia vera dating back to August 1987. JAK2 mutation was  not detected. The patient's hemoglobin had been 18.9 and hematocrit 57.1. One-unit phlebotomies were carried out most recently on 08/25/2010, 08/21/2011 and 05/21/2012.  The patient tolerates these phlebotomies without any adverse effects. Cutoff for phlebotomy is currently 45%.    2. Thrombocytopenia most likely due to immune dysregulation.  3. Renal insufficiency.  4. Diabetes mellitus type 2.  5. Hypertension.  6. Osteoarthritis particularly involving the hands.  7. Elevated PSA followed by Dr. Darvin Stephenson. The patient had negative  biopsy in September 2003. Biopsy in August 2006 may having shown  PIN.  8. Positive family history for prostate cancer in 2 of the patient's  brothers.  9. Tumor involving left lower legs, resected on 11/02/2005 showing a  benign cavernous hemangioma. Surgery was carried out by Dr.  Romana Stephenson at [facility].  Margins were free.  10.Right total hip replacement in 1993 with several subsequent  surgeries.  11.Right renal cyst status post aspiration. No malignant cells were  identified. Aspiration was carried out on 01/14/2010.  12.Multiple liver lesions seen on CT scanning going back to August  2007, most likely liver cysts felt to be benign.  13.Dyslipidemia.   MEDICATIONS:  Reviewed and recorded. Current Outpatient Prescriptions  Medication Sig Dispense Refill  . ALPRAZolam (XANAX) 0.5 MG tablet Take 0.5 mg by mouth daily.      . Ascorbic Acid (VITAMIN C) 1000 MG tablet Take 1,000 mg by mouth daily.      Marland Kitchen aspirin 325 MG EC tablet Take 325 mg by mouth daily.      . calcium carbonate (OS-CAL) 600 MG TABS Take 600 mg by mouth 2 (two) times daily with a meal.      . doxazosin (CARDURA) 4 MG tablet Take 4 mg by mouth at bedtime.      Marland Kitchen doxylamine, Sleep, (UNISOM) 25 MG tablet Take  25 mg by mouth at bedtime as needed.      . dutasteride (AVODART) 0.5 MG capsule Take 0.5 mg by mouth daily.      Marland Kitchen esomeprazole (NEXIUM) 40 MG capsule Take 40 mg by mouth daily before breakfast.      . fish oil-omega-3 fatty acids 1000 MG capsule Take 600 mg by mouth 2 (two) times daily.      Marland Kitchen glipiZIDE (GLUCOTROL XL) 5 MG 24 hr tablet Take 5 mg by mouth daily.      Marland Kitchen glucosamine-chondroitin 500-400 MG tablet Take 1 tablet by mouth as needed.      . hydrochlorothiazide (MICROZIDE) 12.5 MG capsule Take 12.5 mg by mouth daily. 1/2 tab po bid      . lisinopril (PRINIVIL,ZESTRIL) 5 MG tablet Take 5 mg by mouth daily.      . metoprolol (LOPRESSOR) 50 MG tablet Take 25 mg by mouth 2 (two) times daily.      . Multiple Vitamins-Minerals (MULTIVITAMIN WITH MINERALS) tablet Take 1 tablet by mouth daily.      . pravastatin (PRAVACHOL) 20 MG tablet Take 20 mg by mouth daily.      . traMADol (ULTRAM) 50 MG tablet Take 50 mg by mouth 2 (two) times daily.       . vitamin E (VITAMIN E) 400 UNIT capsule Take 400 Units by mouth daily.       No current facility-administered medications for this visit.  SMOKING HISTORY:  Patient has never smoked cigarettes.   HISTORY:  I saw Aaron Stephenson today for followup of his polycythemia vera.  JAK2 mutation was not detected in March 2007.  Aaron Stephenson was last seen by [place] on 11/21/2011 and on 05/22/2011.  His most recent phlebotomies were carried out on 08/21/2011 when his hematocrit was 45.8 and on 08/25/2010 when his hematocrit was 45.9.  The patient seems to be getting along quite well.  He swims on a regular basis.  He also plays golf.  He denies any serious medical problems.  He has some arthritis in his fingers, also some transient neuropathy involving the first 3 fingers of both hands, possibly carpal tunnel syndrome.  The patient also has some urinary difficulties with occasional leakage and some urgency.  He sees Dr. Darvin Stephenson for this.  He tolerates  his phlebotomies well.  He is without any major complaints today.  PHYSICAL EXAMINATION:  General:  Aaron Stephenson looks well.  He is 79 years old.  Weight is well controlled, 177 pounds 14.4 ounces.  Height 5 feet 1 inch, body surface area 1.86 sq m.  Vital Signs:  Blood pressure 136/65.  Other vital signs are normal.  HEENT:  There is no scleral icterus.  Mouth and pharynx are benign.  No peripheral adenopathy palpable.  Lungs:  Clear to percussion and auscultation.  Cardiac: Regular rhythm.  I thought I could hear a soft diastolic murmur at the left base and apex.  Abdomen:  Benign with no organomegaly or masses palpable.  He has a diastasis recti.  Extremities:  No peripheral edema or clubbing.  He has degenerative arthritis involving his fingers. Neurologic exam is normal.  LABORATORY DATA:  Today, white count 5.0, ANC 2.9, hemoglobin 14.8, hematocrit 46.4, platelets 103,000.  Chemistries today were notable for a glucose of 164, albumin 3.2, BUN 29, creatinine 1.8, LDH 174.  IMAGING STUDIES:  1. CT scan of abdomen and pelvis on 11/15/2005 showed significantly  enlarged prostate with a degree of bladder outlet obstruction.  There was some atrophy of the right ileus psoas musculature, right  proximal thigh musculature relative to the left associated with a  right total hip replacement. There were multiple hepatic cysts.  There were bilateral renal cysts. There was a small exophytic  lesion in the upper pole of the right kidney which was  indeterminate. Ultrasound or MRI was recommended for further  assessment.  2. Renal urinary tract ultrasound on 11/24/2005 showed tiny right  upper pole renal lesion, not well visualized by ultrasound.  Consider further CT scans or dedicated renal MR. Bilateral renal  and hepatic cysts were described.  3. CT of abdomen and pelvis without IV contrast on 12/28/2009 showed  a 16 cm right renal cyst increased from 8.6 cm on the prior study  with  noncontrast imaging characteristics of a simple cyst. There  was no ascites or free air. Unremarkable uninfused evaluation of  spleen, adrenal glands, gallbladder. There were multiple low  attenuation hepatic lesions which are stable in size, smaller  compared with the prior study, probably benign cyst or possibly  biliary hamartomas.  4. Ultrasound guided aspiration of right renal cyst on 01/14/2010.  Giant right renal cyst identified measuring over 15 cm. Underwent  aspiration, yielded clear fluid with a total volume of 1400 mL  removed. This resulted in complete collapse of the cyst by  ultrasound. Cytology showed no malignant cells.   IMPRESSION AND PLAN:  Aaron Stephenson continues to  do quite well.  Today his hematocrit is 46.4.  He will undergo a 1-unit phlebotomy today.  He tolerates these well.  We are setting Aaron Stephenson up as we have done in the past with CBCs every 3 months and a 1-unit phlebotomy whenever his hematocrit is greater than or equal to 45%.  CBC and phlebotomy were ordered on standing orders.  Will plan to see Aaron Stephenson again in 6 months, at which time we will check CBC and chemistries.    ______________________________ Samul Dada, M.D. DSM/MEDQ  D:  05/21/2012  T:  05/22/2012  Job:  [id number]

## 2012-06-05 DIAGNOSIS — H04129 Dry eye syndrome of unspecified lacrimal gland: Secondary | ICD-10-CM | POA: Diagnosis not present

## 2012-06-05 DIAGNOSIS — H5789 Other specified disorders of eye and adnexa: Secondary | ICD-10-CM | POA: Diagnosis not present

## 2012-06-05 DIAGNOSIS — H01009 Unspecified blepharitis unspecified eye, unspecified eyelid: Secondary | ICD-10-CM | POA: Diagnosis not present

## 2012-06-24 DIAGNOSIS — C61 Malignant neoplasm of prostate: Secondary | ICD-10-CM | POA: Diagnosis not present

## 2012-06-24 DIAGNOSIS — N320 Bladder-neck obstruction: Secondary | ICD-10-CM | POA: Diagnosis not present

## 2012-06-24 DIAGNOSIS — Q619 Cystic kidney disease, unspecified: Secondary | ICD-10-CM | POA: Diagnosis not present

## 2012-06-26 DIAGNOSIS — I10 Essential (primary) hypertension: Secondary | ICD-10-CM | POA: Diagnosis not present

## 2012-06-26 DIAGNOSIS — D509 Iron deficiency anemia, unspecified: Secondary | ICD-10-CM | POA: Diagnosis not present

## 2012-06-26 DIAGNOSIS — N2581 Secondary hyperparathyroidism of renal origin: Secondary | ICD-10-CM | POA: Diagnosis not present

## 2012-07-18 DIAGNOSIS — Q619 Cystic kidney disease, unspecified: Secondary | ICD-10-CM | POA: Diagnosis not present

## 2012-07-19 ENCOUNTER — Telehealth: Payer: Self-pay

## 2012-07-19 NOTE — Telephone Encounter (Signed)
Pt received retirement letter. Encouraged him to keep appts at [facility]. We will have an interim doctor until we find a permanent replacement. Pt stated he will keep appts.

## 2012-08-20 ENCOUNTER — Ambulatory Visit: Payer: Medicare Other

## 2012-08-20 ENCOUNTER — Other Ambulatory Visit (HOSPITAL_BASED_OUTPATIENT_CLINIC_OR_DEPARTMENT_OTHER): Payer: Medicare Other | Admitting: Lab

## 2012-08-20 DIAGNOSIS — N183 Chronic kidney disease, stage 3 unspecified: Secondary | ICD-10-CM | POA: Diagnosis not present

## 2012-08-20 DIAGNOSIS — D509 Iron deficiency anemia, unspecified: Secondary | ICD-10-CM | POA: Diagnosis not present

## 2012-08-20 DIAGNOSIS — D45 Polycythemia vera: Secondary | ICD-10-CM | POA: Diagnosis not present

## 2012-08-20 DIAGNOSIS — N2581 Secondary hyperparathyroidism of renal origin: Secondary | ICD-10-CM | POA: Diagnosis not present

## 2012-08-20 LAB — CBC WITH DIFFERENTIAL/PLATELET
Basophils Absolute: 0 10*3/uL (ref 0.0–0.1)
Eosinophils Absolute: 0.2 10*3/uL (ref 0.0–0.5)
HGB: 13.9 g/dL (ref 13.0–17.1)
MCV: 76.4 fL — ABNORMAL LOW (ref 79.3–98.0)
MONO#: 0.5 10*3/uL (ref 0.1–0.9)
MONO%: 8.7 % (ref 0.0–14.0)
NEUT#: 2.9 10*3/uL (ref 1.5–6.5)
Platelets: 124 10*3/uL — ABNORMAL LOW (ref 140–400)
RDW: 15.4 % — ABNORMAL HIGH (ref 11.0–14.6)
WBC: 5.4 10*3/uL (ref 4.0–10.3)

## 2012-08-20 NOTE — Progress Notes (Signed)
Patient due today for therapeutic phlebotomy.  Cutoff for HCT is 45%.  Pt's HCT today is only 43.5%.  Therapeutic phlebotomy cancelled.  Informed patient of this and educated to keep next follow up appointment and call if concerns. Aaron Stephenson  08/20/2012  11:02 AM

## 2012-08-28 ENCOUNTER — Other Ambulatory Visit: Payer: Self-pay | Admitting: Dermatology

## 2012-08-28 DIAGNOSIS — L570 Actinic keratosis: Secondary | ICD-10-CM | POA: Diagnosis not present

## 2012-08-28 DIAGNOSIS — D0460 Carcinoma in situ of skin of unspecified upper limb, including shoulder: Secondary | ICD-10-CM | POA: Diagnosis not present

## 2012-08-28 DIAGNOSIS — L821 Other seborrheic keratosis: Secondary | ICD-10-CM | POA: Diagnosis not present

## 2012-08-28 DIAGNOSIS — D485 Neoplasm of uncertain behavior of skin: Secondary | ICD-10-CM | POA: Diagnosis not present

## 2012-09-05 DIAGNOSIS — L0100 Impetigo, unspecified: Secondary | ICD-10-CM | POA: Diagnosis not present

## 2012-10-08 DIAGNOSIS — E119 Type 2 diabetes mellitus without complications: Secondary | ICD-10-CM | POA: Diagnosis not present

## 2012-10-08 DIAGNOSIS — Z23 Encounter for immunization: Secondary | ICD-10-CM | POA: Diagnosis not present

## 2012-10-08 DIAGNOSIS — M19049 Primary osteoarthritis, unspecified hand: Secondary | ICD-10-CM | POA: Diagnosis not present

## 2012-11-20 ENCOUNTER — Other Ambulatory Visit: Payer: Self-pay | Admitting: Internal Medicine

## 2012-11-20 DIAGNOSIS — D45 Polycythemia vera: Secondary | ICD-10-CM

## 2012-11-21 ENCOUNTER — Telehealth: Payer: Self-pay | Admitting: Internal Medicine

## 2012-11-21 ENCOUNTER — Encounter (INDEPENDENT_AMBULATORY_CARE_PROVIDER_SITE_OTHER): Payer: Self-pay

## 2012-11-21 ENCOUNTER — Other Ambulatory Visit (HOSPITAL_BASED_OUTPATIENT_CLINIC_OR_DEPARTMENT_OTHER): Payer: Medicare Other | Admitting: Lab

## 2012-11-21 ENCOUNTER — Encounter: Payer: Self-pay | Admitting: Internal Medicine

## 2012-11-21 ENCOUNTER — Ambulatory Visit (HOSPITAL_BASED_OUTPATIENT_CLINIC_OR_DEPARTMENT_OTHER): Payer: Medicare Other | Admitting: Internal Medicine

## 2012-11-21 VITALS — BP 147/59 | HR 55 | Temp 97.8°F | Resp 18 | Ht 61.0 in | Wt 178.4 lb

## 2012-11-21 DIAGNOSIS — D45 Polycythemia vera: Secondary | ICD-10-CM

## 2012-11-21 LAB — COMPREHENSIVE METABOLIC PANEL (CC13)
ALT: 14 U/L (ref 0–55)
AST: 19 U/L (ref 5–34)
Albumin: 3.4 g/dL — ABNORMAL LOW (ref 3.5–5.0)
Alkaline Phosphatase: 43 U/L (ref 40–150)
Calcium: 9.7 mg/dL (ref 8.4–10.4)
Chloride: 108 mEq/L (ref 98–109)
Potassium: 4.7 mEq/L (ref 3.5–5.1)
Sodium: 143 mEq/L (ref 136–145)
Total Protein: 7 g/dL (ref 6.4–8.3)

## 2012-11-21 LAB — CBC WITH DIFFERENTIAL/PLATELET
EOS%: 2.6 % (ref 0.0–7.0)
HGB: 13.9 g/dL (ref 13.0–17.1)
MCH: 24.1 pg — ABNORMAL LOW (ref 27.2–33.4)
MCV: 75.9 fL — ABNORMAL LOW (ref 79.3–98.0)
MONO%: 7.6 % (ref 0.0–14.0)
NEUT#: 2.9 10*3/uL (ref 1.5–6.5)
RBC: 5.78 10*6/uL (ref 4.20–5.82)
RDW: 17 % — ABNORMAL HIGH (ref 11.0–14.6)
lymph#: 1.8 10*3/uL (ref 0.9–3.3)

## 2012-11-21 NOTE — Patient Instructions (Signed)
Therapeutic Phlebotomy °Therapeutic phlebotomy is the controlled removal of blood from your body for the purpose of treating a medical condition. It is similar to donating blood. Usually, about a pint (470 mL) of blood is removed. The average adult has 9 to 12 pints (4.3 to 5.7 L) of blood. °Therapeutic phlebotomy may be used to treat the following medical conditions: °· Hemochromatosis. This is a condition in which there is too much iron in the blood. °· Polycythemia vera. This is a condition in which there are too many red cells in the blood. °· Porphyria cutanea tarda. This is a disease usually passed from one generation to the next (inherited). It is a condition in which an important part of hemoglobin is not made properly. This results in the build up of abnormal amounts of porphyrins in the body. °· Sickle cell disease. This is an inherited disease. It is a condition in which the red blood cells form an abnormal crescent shape rather than a round shape. °LET YOUR CAREGIVER KNOW ABOUT: °· Allergies. °· Medicines taken including herbs, eyedrops, over-the-counter medicines, and creams. °· Use of steroids (by mouth or creams). °· Previous problems with anesthetics or numbing medicine. °· History of blood clots. °· History of bleeding or blood problems. °· Previous surgery. °· Possibility of pregnancy, if this applies. °RISKS AND COMPLICATIONS °This is a simple and safe procedure. Problems are unlikely.  However, problems can occur and may include: °· Nausea or lightheadedness. °· Low blood pressure. °· Soreness, bleeding, swelling, or bruising at the needle insertion site. °· Infection. °BEFORE THE PROCEDURE  °This is a procedure that can be done as an outpatient. Confirm the time that you need to arrive for your procedure. Confirm whether there is a need to fast or withhold any medications. It is helpful to wear clothing with sleeves that can be raised above the elbow. A blood sample may be done to determine the  amount of red blood cells or iron in your blood. Plan ahead of time to have someone drive you home after the procedure. °PROCEDURE °The entire procedure from preparation through recovery takes about 1 hour. The actual collection takes about 10 to 15 minutes. °· A needle will be inserted into your vein. °· Tubing and a collection bag will be attached to that needle. °· Blood will flow through the needle and tubing into the collection bag. °· You may be asked to open and close your hand slowly and continuously during the entire collection. °· Once the specified amount of blood has been removed from your body, the collection bag and tubing will be clamped. °· The needle will be removed. °· Pressure will be held on the site of the needle insertion to stop the bleeding. Then a bandage will be placed over the needle insertion site. °AFTER THE PROCEDURE  °Your recovery will be assessed and monitored. If there are no problems, as an outpatient, you should be able to go home shortly after the procedure.  °Document Released: 07/04/2010 Document Revised: 04/24/2011 Document Reviewed: 07/04/2010 °ExitCare® Patient Information ©2014 [employer]. °Polycythemia Vera  °Polycythemia Vera is a condition in which the body makes too many red blood cells and there is no known cause. The red blood cells (erythrocytes) are the cells which carry the oxygen in your blood stream to the cells of your body. Because of the increased red blood cells, the blood becomes thicker and does not circulate as well. It would be similar to your car having oil which is   too thick so it cannot start and circulate as well. When the blood is too thick it often causes headaches and dizziness. It may also cause blood clots. Even though the blood clots easier, these patients bleed easier. The bleeding is caused because the blood cells which help stop bleeding (platelets) do not function normally. It occurs in all age groups but is more common in the 50 to 70  year age range. °TREATMENT  °The treatment of polycythemia vera for many years has been blood removal (phlebotomy) which is similar to blood removal in a blood bank, however this blood is not used for donation. Hydroxyurea is used to supplement phlebotomy. Aspirin is commonly given to thin the blood as long as the patient does not have a problem with bleeding. Other drugs are used based on the progression of the disease. °Document Released: 10/25/2000 Document Revised: 04/24/2011 Document Reviewed: 05/01/2008 °ExitCare® Patient Information ©2014 [employer]. ° ° °

## 2012-11-21 NOTE — Telephone Encounter (Signed)
gv and printed appt sched and avs for Jan and April 2015

## 2012-11-22 NOTE — Progress Notes (Signed)
[facility] OFFICE PROGRESS NOTE  Aaron Aschoff, MD [address] [place] [address]  DIAGNOSIS: Polycythemia vera - Plan: CBC with Differential, Comprehensive metabolic panel, CBC with Differential  Chief Complaint  Patient presents with  . Polycythemia vera    CURRENT THERAPY: Phlebotomy if his hematocrit is greater than 45%.  INTERVAL HISTORY: Aaron Stephenson 79 y.o. male with a history of polycythemia vera dating back to an August 1987, sac 2 mutation was not detected, he presents for followup. He was last seen by Dr.Murinson on 05/21/2012. One unit phlebotomy is having carried out most recently on 08/25/2010. 08/21/2011. In 05/21/2012. The patient tolerates these phlebotomies without any adverse effects. Today the patient reports he is doing quite well. His major concern is continuous arthritis changes noted in his hands shoulders and knees. He reports that this makes it difficult for him to continue to golf. He swims at least 3 times a week. Also reports some transient neuro neuropathy involving the first 3 digits of both hands. He reports urinary difficulties with occasional leakage and some urgency. He is without major complaints today.  MEDICAL HISTORY: Past Medical History  Diagnosis Date  . Polycythemia vera(238.4) 02/20/2011  . DM (diabetes mellitus), type 2   . HTN (hypertension)   . OA (osteoarthritis)   . Hyperlipidemia   . Esophagitis   . GERD (gastroesophageal reflux disease)   . Hiatal hernia   . Migraine   . Vertigo   . Thrombocytopenia   . Myeloproliferative disease   . Spinal stenosis, lumbar     INTERIM HISTORY: has Polycythemia vera on his problem list.    ALLERGIES:  is allergic to penicillins.  MEDICATIONS: has a current medication list which includes the following prescription(s): alprazolam, vitamin c, aspirin, calcium carbonate, doxazosin, doxylamine (sleep), dutasteride, esomeprazole, fish oil-omega-3 fatty acids,  glipizide, glucosamine-chondroitin, hydrochlorothiazide, lisinopril, metoprolol, multivitamin with minerals, pravastatin, tamsulosin, tramadol, and vitamin e.  SURGICAL HISTORY:  Past Surgical History  Procedure Laterality Date  . Total hip arthroplasty  1993    right  . Lower leg soft tissue tumor excision      REVIEW OF SYSTEMS:   Constitutional: Denies fevers, chills or abnormal weight loss Eyes: Denies blurriness of vision Ears, nose, mouth, throat, and face: Denies mucositis or sore throat Respiratory: Denies cough, dyspnea or wheezes Cardiovascular: Denies palpitation, chest discomfort or lower extremity swelling Gastrointestinal:  Denies nausea, heartburn or change in bowel habits Skin: Denies abnormal skin rashes Lymphatics: Denies new lymphadenopathy or easy bruising Neurological:Denies numbness, tingling or new weaknesses Behavioral/Psych: Mood is stable, no new changes  All other systems were reviewed with the patient and are negative.  PHYSICAL EXAMINATION: ECOG PERFORMANCE STATUS: 0 - Asymptomatic  Blood pressure 147/59, pulse 55, temperature 97.8 F (36.6 C), temperature source Oral, resp. rate 18, height 5\' 1"  (1.549 m), weight 178 lb 6.4 oz (80.922 kg), SpO2 100.00%.  GENERAL:alert, no distress and comfortable, elderly male SKIN: skin color, texture, turgor are normal, no rashes or significant lesions EYES: normal, Conjunctiva are pink and non-injected, sclera clear OROPHARYNX:no exudate, no erythema and lips, buccal mucosa, and tongue normal  NECK: supple, thyroid normal size, non-tender, without nodularity LYMPH:  no palpable lymphadenopathy in the cervical, axillary or supraclavicular LUNGS: clear to auscultation and percussion with normal breathing effort HEART: regular rate & rhythm and no murmurs and no lower extremity edema ABDOMEN:abdomen soft, non-tender and normal bowel sounds Musculoskeletal:no cyanosis of digits and no clubbing ; positive arthritic  changes  in both hands bilaterally. NEURO: alert & oriented x 3 with fluent speech, no focal motor/sensory deficits   LABORATORY DATA: Results for orders placed in visit on 11/21/12 (from the past 48 hour(s))  CBC WITH DIFFERENTIAL     Status: Abnormal   Collection Time    11/21/12 10:45 AM      Result Value Range   WBC 5.2  4.0 - 10.3 10e3/uL   NEUT# 2.9  1.5 - 6.5 10e3/uL   HGB 13.9  13.0 - 17.1 g/dL   HCT 78.4  69.6 - 29.5 %   Platelets 103 (*) 140 - 400 10e3/uL   MCV 75.9 (*) 79.3 - 98.0 fL   MCH 24.1 (*) 27.2 - 33.4 pg   MCHC 31.7 (*) 32.0 - 36.0 g/dL   RBC 2.84  1.32 - 4.40 10e6/uL   RDW 17.0 (*) 11.0 - 14.6 %   lymph# 1.8  0.9 - 3.3 10e3/uL   MONO# 0.4  0.1 - 0.9 10e3/uL   Eosinophils Absolute 0.1  0.0 - 0.5 10e3/uL   Basophils Absolute 0.1  0.0 - 0.1 10e3/uL   NEUT% 55.0  39.0 - 75.0 %   LYMPH% 33.6  14.0 - 49.0 %   MONO% 7.6  0.0 - 14.0 %   EOS% 2.6  0.0 - 7.0 %   BASO% 1.2  0.0 - 2.0 %  COMPREHENSIVE METABOLIC PANEL (CC13)     Status: Abnormal   Collection Time    11/21/12 10:45 AM      Result Value Range   Sodium 143  136 - 145 mEq/L   Potassium 4.7  3.5 - 5.1 mEq/L   Chloride 108  98 - 109 mEq/L   CO2 28  22 - 29 mEq/L   Glucose 149 (*) 70 - 140 mg/dl   BUN 10.2 (*) 7.0 - 72.5 mg/dL   Creatinine 1.8 (*) 0.7 - 1.3 mg/dL   Total Bilirubin 3.66  0.20 - 1.20 mg/dL   Alkaline Phosphatase 43  40 - 150 U/L   AST 19  5 - 34 U/L   ALT 14  0 - 55 U/L   Total Protein 7.0  6.4 - 8.3 g/dL   Albumin 3.4 (*) 3.5 - 5.0 g/dL   Calcium 9.7  8.4 - 44.0 mg/dL   Anion Gap 7  3 - 11 mEq/L    Labs:  Lab Results  Component Value Date   WBC 5.2 11/21/2012   HGB 13.9 11/21/2012   HCT 43.9 11/21/2012   MCV 75.9* 11/21/2012   PLT 103* 11/21/2012   NEUTROABS 2.9 11/21/2012      Chemistry      Component Value Date/Time   NA 143 11/21/2012 1045   NA 139 05/22/2011 1039   K 4.7 11/21/2012 1045   K 4.5 05/22/2011 1039   CL 108* 05/21/2012 1040   CL 105 05/22/2011 1039   CO2 28  11/21/2012 1045   CO2 26 05/22/2011 1039   BUN 29.2* 11/21/2012 1045   BUN 25* 05/22/2011 1039   CREATININE 1.8* 11/21/2012 1045   CREATININE 1.76* 05/22/2011 1039      Component Value Date/Time   CALCIUM 9.7 11/21/2012 1045   CALCIUM 9.3 05/22/2011 1039   ALKPHOS 43 11/21/2012 1045   ALKPHOS 43 05/22/2011 1039   AST 19 11/21/2012 1045   AST 16 05/22/2011 1039   ALT 14 11/21/2012 1045   ALT 12 05/22/2011 1039   BILITOT 0.68 11/21/2012 1045   BILITOT 0.6 05/22/2011 1039  ASSESSMENT: Aaron Stephenson 79 y.o. male with a history of Polycythemia vera - Plan: CBC with Differential, Comprehensive metabolic panel, CBC with Differential  PLAN:  1. Polycythemia Vera. --Mr. Prosch continues to do quite well. Today his  hematocrit is 43.9. He will not need to undergo a 1-unit phlebotomy today.  We are setting Mr. Prim up as we have done in the past with CBCs every 3 months and a 1-unit phlebotomy whenever his hematocrit is greater than or equal to 45%. Patient will return to clinic in 6 months, at which time we will check CBC and chemistries.  All questions were answered. The patient knows to call the clinic with any problems, questions or concerns. We can certainly see the patient much sooner if necessary.  I spent 10 minutes counseling the patient face to face. The total time spent in the appointment was 15 minutes.    Erielle Gawronski, MD 11/22/2012 2:27 PM

## 2012-12-16 DIAGNOSIS — I129 Hypertensive chronic kidney disease with stage 1 through stage 4 chronic kidney disease, or unspecified chronic kidney disease: Secondary | ICD-10-CM | POA: Diagnosis not present

## 2012-12-16 DIAGNOSIS — E119 Type 2 diabetes mellitus without complications: Secondary | ICD-10-CM | POA: Diagnosis not present

## 2012-12-16 DIAGNOSIS — N320 Bladder-neck obstruction: Secondary | ICD-10-CM | POA: Diagnosis not present

## 2012-12-16 DIAGNOSIS — Q619 Cystic kidney disease, unspecified: Secondary | ICD-10-CM | POA: Diagnosis not present

## 2012-12-16 DIAGNOSIS — C61 Malignant neoplasm of prostate: Secondary | ICD-10-CM | POA: Diagnosis not present

## 2012-12-16 DIAGNOSIS — N183 Chronic kidney disease, stage 3 unspecified: Secondary | ICD-10-CM | POA: Diagnosis not present

## 2013-01-22 DIAGNOSIS — D509 Iron deficiency anemia, unspecified: Secondary | ICD-10-CM | POA: Diagnosis not present

## 2013-01-22 DIAGNOSIS — N183 Chronic kidney disease, stage 3 unspecified: Secondary | ICD-10-CM | POA: Diagnosis not present

## 2013-01-22 DIAGNOSIS — I129 Hypertensive chronic kidney disease with stage 1 through stage 4 chronic kidney disease, or unspecified chronic kidney disease: Secondary | ICD-10-CM | POA: Diagnosis not present

## 2013-01-22 DIAGNOSIS — N2581 Secondary hyperparathyroidism of renal origin: Secondary | ICD-10-CM | POA: Diagnosis not present

## 2013-01-30 DIAGNOSIS — M1990 Unspecified osteoarthritis, unspecified site: Secondary | ICD-10-CM | POA: Diagnosis not present

## 2013-02-14 ENCOUNTER — Other Ambulatory Visit (HOSPITAL_BASED_OUTPATIENT_CLINIC_OR_DEPARTMENT_OTHER): Payer: Medicare Other

## 2013-02-14 ENCOUNTER — Encounter (INDEPENDENT_AMBULATORY_CARE_PROVIDER_SITE_OTHER): Payer: Self-pay

## 2013-02-14 DIAGNOSIS — D45 Polycythemia vera: Secondary | ICD-10-CM

## 2013-02-14 LAB — CBC WITH DIFFERENTIAL/PLATELET
BASO%: 1.4 % (ref 0.0–2.0)
BASOS ABS: 0.1 10*3/uL (ref 0.0–0.1)
EOS%: 2.3 % (ref 0.0–7.0)
Eosinophils Absolute: 0.1 10*3/uL (ref 0.0–0.5)
HEMATOCRIT: 44.6 % (ref 38.4–49.9)
HEMOGLOBIN: 14.2 g/dL (ref 13.0–17.1)
LYMPH#: 1.7 10*3/uL (ref 0.9–3.3)
LYMPH%: 31.3 % (ref 14.0–49.0)
MCH: 24.6 pg — ABNORMAL LOW (ref 27.2–33.4)
MCHC: 31.8 g/dL — ABNORMAL LOW (ref 32.0–36.0)
MCV: 77.4 fL — ABNORMAL LOW (ref 79.3–98.0)
MONO#: 0.4 10*3/uL (ref 0.1–0.9)
MONO%: 7.7 % (ref 0.0–14.0)
NEUT%: 57.3 % (ref 39.0–75.0)
NEUTROS ABS: 3 10*3/uL (ref 1.5–6.5)
Platelets: 105 10*3/uL — ABNORMAL LOW (ref 140–400)
RBC: 5.76 10*6/uL (ref 4.20–5.82)
RDW: 16.3 % — AB (ref 11.0–14.6)
WBC: 5.3 10*3/uL (ref 4.0–10.3)

## 2013-03-05 DIAGNOSIS — Z85828 Personal history of other malignant neoplasm of skin: Secondary | ICD-10-CM | POA: Diagnosis not present

## 2013-03-05 DIAGNOSIS — L821 Other seborrheic keratosis: Secondary | ICD-10-CM | POA: Diagnosis not present

## 2013-03-05 DIAGNOSIS — L570 Actinic keratosis: Secondary | ICD-10-CM | POA: Diagnosis not present

## 2013-03-05 DIAGNOSIS — L820 Inflamed seborrheic keratosis: Secondary | ICD-10-CM | POA: Diagnosis not present

## 2013-03-05 DIAGNOSIS — L723 Sebaceous cyst: Secondary | ICD-10-CM | POA: Diagnosis not present

## 2013-05-16 ENCOUNTER — Other Ambulatory Visit: Payer: Medicare Other

## 2013-05-16 ENCOUNTER — Ambulatory Visit: Payer: Medicare Other

## 2013-05-27 ENCOUNTER — Ambulatory Visit: Payer: Medicare Other

## 2013-05-27 DIAGNOSIS — E119 Type 2 diabetes mellitus without complications: Secondary | ICD-10-CM | POA: Diagnosis not present

## 2013-05-27 DIAGNOSIS — Z0000 Encounter for general adult medical examination without abnormal findings: Secondary | ICD-10-CM | POA: Diagnosis not present

## 2013-05-28 ENCOUNTER — Encounter: Payer: Self-pay | Admitting: Internal Medicine

## 2013-05-28 ENCOUNTER — Other Ambulatory Visit (HOSPITAL_BASED_OUTPATIENT_CLINIC_OR_DEPARTMENT_OTHER): Payer: Medicare Other

## 2013-05-28 ENCOUNTER — Telehealth: Payer: Self-pay | Admitting: Internal Medicine

## 2013-05-28 ENCOUNTER — Ambulatory Visit (HOSPITAL_BASED_OUTPATIENT_CLINIC_OR_DEPARTMENT_OTHER): Payer: Medicare Other | Admitting: Internal Medicine

## 2013-05-28 ENCOUNTER — Ambulatory Visit (HOSPITAL_BASED_OUTPATIENT_CLINIC_OR_DEPARTMENT_OTHER): Payer: Medicare Other

## 2013-05-28 VITALS — BP 135/59 | HR 57 | Temp 98.5°F | Resp 18 | Ht 61.0 in | Wt 175.8 lb

## 2013-05-28 VITALS — BP 136/64 | HR 59 | Temp 96.8°F | Resp 18

## 2013-05-28 DIAGNOSIS — D45 Polycythemia vera: Secondary | ICD-10-CM

## 2013-05-28 DIAGNOSIS — E119 Type 2 diabetes mellitus without complications: Secondary | ICD-10-CM | POA: Diagnosis not present

## 2013-05-28 LAB — CBC WITH DIFFERENTIAL/PLATELET
BASO%: 0.9 % (ref 0.0–2.0)
BASOS ABS: 0 10*3/uL (ref 0.0–0.1)
EOS%: 2.5 % (ref 0.0–7.0)
Eosinophils Absolute: 0.1 10*3/uL (ref 0.0–0.5)
HEMATOCRIT: 47.2 % (ref 38.4–49.9)
HGB: 14.9 g/dL (ref 13.0–17.1)
LYMPH%: 35.2 % (ref 14.0–49.0)
MCH: 24.8 pg — AB (ref 27.2–33.4)
MCHC: 31.6 g/dL — AB (ref 32.0–36.0)
MCV: 78.7 fL — AB (ref 79.3–98.0)
MONO#: 0.4 10*3/uL (ref 0.1–0.9)
MONO%: 7.6 % (ref 0.0–14.0)
NEUT#: 3.1 10*3/uL (ref 1.5–6.5)
NEUT%: 53.8 % (ref 39.0–75.0)
PLATELETS: 98 10*3/uL — AB (ref 140–400)
RBC: 6 10*6/uL — ABNORMAL HIGH (ref 4.20–5.82)
RDW: 16.8 % — ABNORMAL HIGH (ref 11.0–14.6)
WBC: 5.7 10*3/uL (ref 4.0–10.3)
lymph#: 2 10*3/uL (ref 0.9–3.3)

## 2013-05-28 LAB — COMPREHENSIVE METABOLIC PANEL (CC13)
ALT: 19 U/L (ref 0–55)
ANION GAP: 6 meq/L (ref 3–11)
AST: 23 U/L (ref 5–34)
Albumin: 3.6 g/dL (ref 3.5–5.0)
Alkaline Phosphatase: 53 U/L (ref 40–150)
BILIRUBIN TOTAL: 0.47 mg/dL (ref 0.20–1.20)
BUN: 29.8 mg/dL — AB (ref 7.0–26.0)
CO2: 27 mEq/L (ref 22–29)
CREATININE: 1.7 mg/dL — AB (ref 0.7–1.3)
Calcium: 9.6 mg/dL (ref 8.4–10.4)
Chloride: 108 mEq/L (ref 98–109)
Glucose: 132 mg/dl (ref 70–140)
Potassium: 4.5 mEq/L (ref 3.5–5.1)
Sodium: 141 mEq/L (ref 136–145)
Total Protein: 6.7 g/dL (ref 6.4–8.3)

## 2013-05-28 NOTE — Progress Notes (Signed)
[facility] OFFICE PROGRESS NOTE  Gus Height, MD [place] [id number]  DIAGNOSIS: Polycythemia vera - Plan: CBC with Differential, Basic metabolic panel (Bmet) - [facility], CBC with Differential  Chief Complaint  Patient presents with  . Polycythemia vera    CURRENT THERAPY: Phlebotomy if his hematocrit is greater than 45%.  INTERVAL HISTORY: Aaron Stephenson 80 y.o. male with a history of polycythemia vera dating back to an August 1987, JAK2 mutation was not detected, he presents for followup. He was last seen by me on 11/21/2012. One unit phlebotomy is having carried out most recently on 08/25/2010. 08/21/2011. In 05/21/2012. The patient tolerates these phlebotomies without any adverse effects. Today the patient reports he is doing quite well. He reports that his arthritis is still bad in hands, shoulders and knees but he is coping well. He takes tramadol bid with moderate relief.   He swims at least 3 times a week at the [facility]. He is without major complaints today. He reports that his wife has been sick and he has been her caretaker with noticeable improvement in her condition over the past month.   MEDICAL HISTORY: Past Medical History  Diagnosis Date  . Polycythemia vera(238.4) 02/20/2011  . DM (diabetes mellitus), type 2   . HTN (hypertension)   . OA (osteoarthritis)   . Hyperlipidemia   . Esophagitis   . GERD (gastroesophageal reflux disease)   . Hiatal hernia   . Migraine   . Vertigo   . Thrombocytopenia   . Myeloproliferative disease   . Spinal stenosis, lumbar     INTERIM HISTORY: has Polycythemia vera on his problem list.    ALLERGIES:  is allergic to penicillins.  MEDICATIONS: has a current medication list which includes the following prescription(s): alprazolam, vitamin c, aspirin, calcium carbonate, doxylamine (sleep), dutasteride, esomeprazole, fish oil-omega-3 fatty acids, glipizide, glucosamine-chondroitin,  hydrochlorothiazide, imiquimod, lisinopril, metoprolol, multivitamin with minerals, pravastatin, tamsulosin, tramadol, and vitamin e.  SURGICAL HISTORY:  Past Surgical History  Procedure Laterality Date  . Total hip arthroplasty  1993    right  . Lower leg soft tissue tumor excision      REVIEW OF SYSTEMS:   Constitutional: Denies fevers, chills or abnormal weight loss Eyes: Denies blurriness of vision Ears, nose, mouth, throat, and face: Denies mucositis or sore throat Respiratory: Denies cough, dyspnea or wheezes Cardiovascular: Denies palpitation, chest discomfort or lower extremity swelling Gastrointestinal:  Denies nausea, heartburn or change in bowel habits Skin: Denies abnormal skin rashes Lymphatics: Denies new lymphadenopathy or easy bruising Neurological:Denies numbness, tingling or new weaknesses Behavioral/Psych: Mood is stable, no new changes  All other systems were reviewed with the patient and are negative.  PHYSICAL EXAMINATION: ECOG PERFORMANCE STATUS: 0 - Asymptomatic  Blood pressure 135/59, pulse 57, temperature 98.5 F (36.9 C), temperature source Oral, resp. rate 18, height 5\' 1"  (1.549 m), weight 175 lb 12.8 oz (79.742 kg).  GENERAL:alert, no distress and comfortable, elderly male SKIN: skin color, texture, turgor are normal, no rashes or significant lesions EYES: normal, Conjunctiva are pink and non-injected, sclera clear; L eye stye OROPHARYNX:no exudate, no erythema and lips, buccal mucosa, and tongue normal  NECK: supple, thyroid normal size, non-tender, without nodularity LYMPH:  no palpable lymphadenopathy in the cervical, axillary or supraclavicular LUNGS: clear to auscultation and percussion with normal breathing effort HEART: regular rate & rhythm and no murmurs and no lower extremity edema ABDOMEN:abdomen soft, non-tender and normal bowel sounds Musculoskeletal:no cyanosis of digits and  no clubbing ; positive arthritic changes in both hands  bilaterally. NEURO: alert & oriented x 3 with fluent speech, no focal motor/sensory deficits   LABORATORY DATA: Results for orders placed in visit on 05/28/13 (from the past 48 hour(s))  CBC WITH DIFFERENTIAL     Status: Abnormal   Collection Time    05/28/13  2:58 PM      Result Value Ref Range   WBC 5.7  4.0 - 10.3 10e3/uL   NEUT# 3.1  1.5 - 6.5 10e3/uL   HGB 14.9  13.0 - 17.1 g/dL   HCT 47.2  38.4 - 49.9 %   Platelets 98 (*) 140 - 400 10e3/uL   MCV 78.7 (*) 79.3 - 98.0 fL   MCH 24.8 (*) 27.2 - 33.4 pg   MCHC 31.6 (*) 32.0 - 36.0 g/dL   RBC 6.00 (*) 4.20 - 5.82 10e6/uL   RDW 16.8 (*) 11.0 - 14.6 %   lymph# 2.0  0.9 - 3.3 10e3/uL   MONO# 0.4  0.1 - 0.9 10e3/uL   Eosinophils Absolute 0.1  0.0 - 0.5 10e3/uL   Basophils Absolute 0.0  0.0 - 0.1 10e3/uL   NEUT% 53.8  39.0 - 75.0 %   LYMPH% 35.2  14.0 - 49.0 %   MONO% 7.6  0.0 - 14.0 %   EOS% 2.5  0.0 - 7.0 %   BASO% 0.9  0.0 - 2.0 %  COMPREHENSIVE METABOLIC PANEL (PF79)     Status: Abnormal   Collection Time    05/28/13  2:58 PM      Result Value Ref Range   Sodium 141  136 - 145 mEq/L   Potassium 4.5  3.5 - 5.1 mEq/L   Chloride 108  98 - 109 mEq/L   CO2 27  22 - 29 mEq/L   Glucose 132  70 - 140 mg/dl   BUN 29.8 (*) 7.0 - 26.0 mg/dL   Creatinine 1.7 (*) 0.7 - 1.3 mg/dL   Total Bilirubin 0.47  0.20 - 1.20 mg/dL   Alkaline Phosphatase 53  40 - 150 U/L   AST 23  5 - 34 U/L   ALT 19  0 - 55 U/L   Total Protein 6.7  6.4 - 8.3 g/dL   Albumin 3.6  3.5 - 5.0 g/dL   Calcium 9.6  8.4 - 10.4 mg/dL   Anion Gap 6  3 - 11 mEq/L    Labs:  Lab Results  Component Value Date   WBC 5.7 05/28/2013   HGB 14.9 05/28/2013   HCT 47.2 05/28/2013   MCV 78.7* 05/28/2013   PLT 98* 05/28/2013   NEUTROABS 3.1 05/28/2013      Chemistry      Component Value Date/Time   NA 141 05/28/2013 1458   NA 139 05/22/2011 1039   K 4.5 05/28/2013 1458   K 4.5 05/22/2011 1039   CL 108* 05/21/2012 1040   CL 105 05/22/2011 1039   CO2 27 05/28/2013 1458   CO2 26  05/22/2011 1039   BUN 29.8* 05/28/2013 1458   BUN 25* 05/22/2011 1039   CREATININE 1.7* 05/28/2013 1458   CREATININE 1.76* 05/22/2011 1039      Component Value Date/Time   CALCIUM 9.6 05/28/2013 1458   CALCIUM 9.3 05/22/2011 1039   ALKPHOS 53 05/28/2013 1458   ALKPHOS 43 05/22/2011 1039   AST 23 05/28/2013 1458   AST 16 05/22/2011 1039   ALT 19 05/28/2013 1458   ALT 12 05/22/2011 1039  BILITOT 0.47 05/28/2013 1458   BILITOT 0.6 05/22/2011 1039      ASSESSMENT: Huntley Dec Sonnenfeld 80 y.o. male with a history of Polycythemia vera - Plan: CBC with Differential, Basic metabolic panel (Bmet) - [facility], CBC with Differential  PLAN:  1. Polycythemia Vera. --Aaron Stephenson continues to do quite well. Today his hematocrit is 47.2. He will not need to undergo a 1-unit phlebotomy today.  We are setting Aaron Stephenson up as we have done in the past with CBCs every 3 months and a 1-unit phlebotomy whenever his hematocrit is greater than or equal to 45%. Patient will return to clinic in 6 months, at which time we will check CBC and chemistries.  All questions were answered. The patient knows to call the clinic with any problems, questions or concerns. We can certainly see the patient much sooner if necessary.  I spent 10 minutes counseling the patient face to face. The total time spent in the appointment was 15 minutes.    Concha Norway, MD 05/28/2013 4:08 PM

## 2013-05-28 NOTE — Patient Instructions (Signed)
Therapeutic Phlebotomy Therapeutic phlebotomy is the controlled removal of blood from your body for the purpose of treating a medical condition. It is similar to donating blood. Usually, about a pint (470 mL) of blood is removed. The average adult has 9 to 12 pints (4.3 to 5.7 L) of blood. Therapeutic phlebotomy may be used to treat the following medical conditions:  Hemochromatosis. This is a condition in which there is too much iron in the blood.  Polycythemia vera. This is a condition in which there are too many red cells in the blood.  Porphyria cutanea tarda. This is a disease usually passed from one generation to the next (inherited). It is a condition in which an important part of hemoglobin is not made properly. This results in the build up of abnormal amounts of porphyrins in the body.  Sickle cell disease. This is an inherited disease. It is a condition in which the red blood cells form an abnormal crescent shape rather than a round shape. LET YOUR CAREGIVER KNOW ABOUT:  Allergies.  Medicines taken including herbs, eyedrops, over-the-counter medicines, and creams.  Use of steroids (by mouth or creams).  Previous problems with anesthetics or numbing medicine.  History of blood clots.  History of bleeding or blood problems.  Previous surgery.  Possibility of pregnancy, if this applies. RISKS AND COMPLICATIONS This is a simple and safe procedure. Problems are unlikely. However, problems can occur and may include:  Nausea or lightheadedness.  Low blood pressure.  Soreness, bleeding, swelling, or bruising at the needle insertion site.  Infection. BEFORE THE PROCEDURE  This is a procedure that can be done as an outpatient. Confirm the time that you need to arrive for your procedure. Confirm whether there is a need to fast or withhold any medications. It is helpful to wear clothing with sleeves that can be raised above the elbow. A blood sample may be done to determine the  amount of red blood cells or iron in your blood. Plan ahead of time to have someone drive you home after the procedure. PROCEDURE The entire procedure from preparation through recovery takes about 1 hour. The actual collection takes about 10 to 15 minutes.  A needle will be inserted into your vein.  Tubing and a collection bag will be attached to that needle.  Blood will flow through the needle and tubing into the collection bag.  You may be asked to open and close your hand slowly and continuously during the entire collection.  Once the specified amount of blood has been removed from your body, the collection bag and tubing will be clamped.  The needle will be removed.  Pressure will be held on the site of the needle insertion to stop the bleeding. Then a bandage will be placed over the needle insertion site. AFTER THE PROCEDURE  Your recovery will be assessed and monitored. If there are no problems, as an outpatient, you should be able to go home shortly after the procedure.  Document Released: 07/04/2010 Document Revised: 04/24/2011 Document Reviewed: 07/04/2010 [employer] Patient Information 2014 [employer].  

## 2013-05-28 NOTE — Progress Notes (Signed)
1640 Phlebotomy to Left AC with 513 g of blood phelbotomized using the phlebotomy kit. Patient tolerated treatment well, with no complaints. Snack and drink provided, patient to stay for 30 minute post observation.

## 2013-05-28 NOTE — Telephone Encounter (Signed)
gv adn printed appt sched and avs for pt for July and OCT...sed added tx. °

## 2013-06-03 DIAGNOSIS — H0019 Chalazion unspecified eye, unspecified eyelid: Secondary | ICD-10-CM | POA: Diagnosis not present

## 2013-06-03 DIAGNOSIS — H01009 Unspecified blepharitis unspecified eye, unspecified eyelid: Secondary | ICD-10-CM | POA: Diagnosis not present

## 2013-06-03 DIAGNOSIS — H04129 Dry eye syndrome of unspecified lacrimal gland: Secondary | ICD-10-CM | POA: Diagnosis not present

## 2013-06-03 DIAGNOSIS — L719 Rosacea, unspecified: Secondary | ICD-10-CM | POA: Diagnosis not present

## 2013-06-16 DIAGNOSIS — Q619 Cystic kidney disease, unspecified: Secondary | ICD-10-CM | POA: Diagnosis not present

## 2013-06-16 DIAGNOSIS — N183 Chronic kidney disease, stage 3 unspecified: Secondary | ICD-10-CM | POA: Diagnosis not present

## 2013-06-16 DIAGNOSIS — N320 Bladder-neck obstruction: Secondary | ICD-10-CM | POA: Diagnosis not present

## 2013-06-16 DIAGNOSIS — N3941 Urge incontinence: Secondary | ICD-10-CM | POA: Diagnosis not present

## 2013-06-16 DIAGNOSIS — C61 Malignant neoplasm of prostate: Secondary | ICD-10-CM | POA: Diagnosis not present

## 2013-06-17 DIAGNOSIS — I129 Hypertensive chronic kidney disease with stage 1 through stage 4 chronic kidney disease, or unspecified chronic kidney disease: Secondary | ICD-10-CM | POA: Diagnosis not present

## 2013-06-17 DIAGNOSIS — C61 Malignant neoplasm of prostate: Secondary | ICD-10-CM | POA: Diagnosis not present

## 2013-06-17 DIAGNOSIS — Q619 Cystic kidney disease, unspecified: Secondary | ICD-10-CM | POA: Diagnosis not present

## 2013-06-17 DIAGNOSIS — N3941 Urge incontinence: Secondary | ICD-10-CM | POA: Diagnosis not present

## 2013-06-17 DIAGNOSIS — N320 Bladder-neck obstruction: Secondary | ICD-10-CM | POA: Diagnosis not present

## 2013-06-17 DIAGNOSIS — E119 Type 2 diabetes mellitus without complications: Secondary | ICD-10-CM | POA: Diagnosis not present

## 2013-06-17 DIAGNOSIS — N183 Chronic kidney disease, stage 3 unspecified: Secondary | ICD-10-CM | POA: Diagnosis not present

## 2013-06-17 DIAGNOSIS — Z7982 Long term (current) use of aspirin: Secondary | ICD-10-CM | POA: Diagnosis not present

## 2013-07-24 DIAGNOSIS — N183 Chronic kidney disease, stage 3 unspecified: Secondary | ICD-10-CM | POA: Diagnosis not present

## 2013-07-24 DIAGNOSIS — N2581 Secondary hyperparathyroidism of renal origin: Secondary | ICD-10-CM | POA: Diagnosis not present

## 2013-07-24 DIAGNOSIS — D509 Iron deficiency anemia, unspecified: Secondary | ICD-10-CM | POA: Diagnosis not present

## 2013-08-07 DIAGNOSIS — H47019 Ischemic optic neuropathy, unspecified eye: Secondary | ICD-10-CM | POA: Diagnosis not present

## 2013-08-07 DIAGNOSIS — H524 Presbyopia: Secondary | ICD-10-CM | POA: Diagnosis not present

## 2013-08-07 DIAGNOSIS — H40019 Open angle with borderline findings, low risk, unspecified eye: Secondary | ICD-10-CM | POA: Diagnosis not present

## 2013-08-07 DIAGNOSIS — H35379 Puckering of macula, unspecified eye: Secondary | ICD-10-CM | POA: Diagnosis not present

## 2013-08-20 ENCOUNTER — Ambulatory Visit: Payer: Medicare Other

## 2013-08-20 ENCOUNTER — Other Ambulatory Visit (HOSPITAL_BASED_OUTPATIENT_CLINIC_OR_DEPARTMENT_OTHER): Payer: Medicare Other

## 2013-08-20 DIAGNOSIS — D45 Polycythemia vera: Secondary | ICD-10-CM | POA: Diagnosis not present

## 2013-08-20 LAB — CBC WITH DIFFERENTIAL/PLATELET
BASO%: 1 % (ref 0.0–2.0)
BASOS ABS: 0.1 10*3/uL (ref 0.0–0.1)
EOS ABS: 0.1 10*3/uL (ref 0.0–0.5)
EOS%: 2.7 % (ref 0.0–7.0)
HEMATOCRIT: 43.8 % (ref 38.4–49.9)
HEMOGLOBIN: 13.7 g/dL (ref 13.0–17.1)
LYMPH%: 32.9 % (ref 14.0–49.0)
MCH: 24.3 pg — AB (ref 27.2–33.4)
MCHC: 31.2 g/dL — ABNORMAL LOW (ref 32.0–36.0)
MCV: 77.8 fL — ABNORMAL LOW (ref 79.3–98.0)
MONO#: 0.4 10*3/uL (ref 0.1–0.9)
MONO%: 7.1 % (ref 0.0–14.0)
NEUT%: 56.3 % (ref 39.0–75.0)
NEUTROS ABS: 3.1 10*3/uL (ref 1.5–6.5)
Platelets: 115 10*3/uL — ABNORMAL LOW (ref 140–400)
RBC: 5.63 10*6/uL (ref 4.20–5.82)
RDW: 15.4 % — AB (ref 11.0–14.6)
WBC: 5.5 10*3/uL (ref 4.0–10.3)
lymph#: 1.8 10*3/uL (ref 0.9–3.3)

## 2013-08-20 NOTE — Progress Notes (Signed)
This RN met with patient in lobby. Hct-43.8, phlebotomy not needed unless Hct greater than 45. Patient given copy of AVS appts w/ lab work. Voices understanding of when to come back and to call if any concerns arise. No concerns at this time.

## 2013-09-03 DIAGNOSIS — L821 Other seborrheic keratosis: Secondary | ICD-10-CM | POA: Diagnosis not present

## 2013-09-03 DIAGNOSIS — Z85828 Personal history of other malignant neoplasm of skin: Secondary | ICD-10-CM | POA: Diagnosis not present

## 2013-09-03 DIAGNOSIS — L570 Actinic keratosis: Secondary | ICD-10-CM | POA: Diagnosis not present

## 2013-09-25 DIAGNOSIS — I129 Hypertensive chronic kidney disease with stage 1 through stage 4 chronic kidney disease, or unspecified chronic kidney disease: Secondary | ICD-10-CM | POA: Diagnosis not present

## 2013-09-25 DIAGNOSIS — D509 Iron deficiency anemia, unspecified: Secondary | ICD-10-CM | POA: Diagnosis not present

## 2013-09-25 DIAGNOSIS — N2581 Secondary hyperparathyroidism of renal origin: Secondary | ICD-10-CM | POA: Diagnosis not present

## 2013-09-25 DIAGNOSIS — N183 Chronic kidney disease, stage 3 unspecified: Secondary | ICD-10-CM | POA: Diagnosis not present

## 2013-10-08 DIAGNOSIS — Z23 Encounter for immunization: Secondary | ICD-10-CM | POA: Diagnosis not present

## 2013-11-19 ENCOUNTER — Other Ambulatory Visit (HOSPITAL_BASED_OUTPATIENT_CLINIC_OR_DEPARTMENT_OTHER): Payer: Medicare Other

## 2013-11-19 ENCOUNTER — Ambulatory Visit (HOSPITAL_BASED_OUTPATIENT_CLINIC_OR_DEPARTMENT_OTHER): Payer: Medicare Other | Admitting: Hematology

## 2013-11-19 ENCOUNTER — Telehealth: Payer: Self-pay | Admitting: Hematology

## 2013-11-19 VITALS — BP 130/58 | HR 65 | Temp 97.8°F | Resp 18 | Ht 61.0 in | Wt 177.1 lb

## 2013-11-19 DIAGNOSIS — D45 Polycythemia vera: Secondary | ICD-10-CM | POA: Diagnosis not present

## 2013-11-19 DIAGNOSIS — N183 Chronic kidney disease, stage 3 (moderate): Secondary | ICD-10-CM | POA: Diagnosis not present

## 2013-11-19 DIAGNOSIS — D509 Iron deficiency anemia, unspecified: Secondary | ICD-10-CM | POA: Diagnosis not present

## 2013-11-19 DIAGNOSIS — D751 Secondary polycythemia: Secondary | ICD-10-CM

## 2013-11-19 DIAGNOSIS — N2581 Secondary hyperparathyroidism of renal origin: Secondary | ICD-10-CM | POA: Diagnosis not present

## 2013-11-19 LAB — CBC WITH DIFFERENTIAL/PLATELET
BASO%: 1.2 % (ref 0.0–2.0)
Basophils Absolute: 0.1 10*3/uL (ref 0.0–0.1)
EOS ABS: 0.3 10*3/uL (ref 0.0–0.5)
EOS%: 5.2 % (ref 0.0–7.0)
HCT: 45.4 % (ref 38.4–49.9)
HGB: 14.1 g/dL (ref 13.0–17.1)
LYMPH%: 26.8 % (ref 14.0–49.0)
MCH: 23.8 pg — ABNORMAL LOW (ref 27.2–33.4)
MCHC: 31.1 g/dL — AB (ref 32.0–36.0)
MCV: 76.4 fL — ABNORMAL LOW (ref 79.3–98.0)
MONO#: 0.4 10*3/uL (ref 0.1–0.9)
MONO%: 7.3 % (ref 0.0–14.0)
NEUT%: 59.5 % (ref 39.0–75.0)
NEUTROS ABS: 3.5 10*3/uL (ref 1.5–6.5)
PLATELETS: 127 10*3/uL — AB (ref 140–400)
RBC: 5.94 10*6/uL — ABNORMAL HIGH (ref 4.20–5.82)
RDW: 17.1 % — ABNORMAL HIGH (ref 11.0–14.6)
WBC: 5.9 10*3/uL (ref 4.0–10.3)
lymph#: 1.6 10*3/uL (ref 0.9–3.3)

## 2013-11-19 LAB — BASIC METABOLIC PANEL (CC13)
Anion Gap: 7 mEq/L (ref 3–11)
BUN: 34 mg/dL — ABNORMAL HIGH (ref 7.0–26.0)
CHLORIDE: 108 meq/L (ref 98–109)
CO2: 26 mEq/L (ref 22–29)
Calcium: 9.5 mg/dL (ref 8.4–10.4)
Creatinine: 1.9 mg/dL — ABNORMAL HIGH (ref 0.7–1.3)
Glucose: 145 mg/dl — ABNORMAL HIGH (ref 70–140)
Potassium: 4.4 mEq/L (ref 3.5–5.1)
Sodium: 141 mEq/L (ref 136–145)

## 2013-11-19 NOTE — Telephone Encounter (Signed)
gv and printed appt sched and avs fo rpt for Jan and April 2016 .Marland Kitchen...sed added tx.

## 2013-11-22 ENCOUNTER — Encounter: Payer: Self-pay | Admitting: Hematology

## 2013-11-22 NOTE — Progress Notes (Signed)
[facility] OFF[facility] PROGRESS NOTE DATE OF VISIT: 11/22/2013  Aaron Height, MD [place] [phone]  DIAGNOSIS: Polycythemia, secondary - Plan: CBC with Differential, Phlebotomy therapeutic  Chief Complaint  Patient presents with  . Follow-up    CURRENT THERAPY: Phlebotomy if his hematocrit is greater than 45%.  INTERVAL HISTORY:  Aaron Stephenson 80 y.o. male with a history of polycythemia vera dating back to an August 1987, JAK2 mutation was not detected, he presents for followup. He was last seen by Dr Juliann Mule on 05/28/13.  One unit phlebotomy is having carried out most recently on 08/25/2010. 08/21/2011. In 05/21/2012. The patient tolerates these phlebotomies without any adverse effects. Today the patient reports he is doing quite well. He reports that his arthritis is still bad in hands, shoulders and knees but he is coping well. He takes tramadol bid with moderate relief.   He swims at least 3 times a week at the [facility]. He is without major complaints today. Patient was taking Vitamin C off and on and I told him not to take that as that also increases or facilitates Iron Absorption and can make polycythemia worse.   MEDICAL HISTORY: Past Medical History  Diagnosis Date  . Polycythemia vera(238.4) 02/20/2011  . DM (diabetes mellitus), type 2   . HTN (hypertension)   . OA (osteoarthritis)   . Hyperlipidemia   . Esophagitis   . GERD (gastroesophageal reflux disease)   . Hiatal hernia   . Migraine   . Vertigo   . Thrombocytopenia   . Myeloproliferative disease   . Spinal stenosis, lumbar     INTERIM HISTORY: has Polycythemia vera on his problem list.    ALLERGIES:  is allergic to penicillins.  MEDICATIONS: has a current medication list which includes the following prescription(s): alprazolam, vitamin c, aspirin, calcium carbonate, doxylamine (sleep), dutasteride, esomeprazole, fish oil-omega-3 fatty acids, glipizide,  glucosamine-chondroitin, hydrochlorothiazide, imiquimod, lisinopril, metoprolol, multivitamin with minerals, pravastatin, tamsulosin, tramadol, and vitamin e.  SURGICAL HISTORY:  Past Surgical History  Procedure Laterality Date  . Total hip arthroplasty  1993    right  . Lower leg soft tissue tumor excision      REVIEW OF SYSTEMS:   Constitutional: Denies fevers, chills or abnormal weight loss Eyes: Denies blurriness of vision Ears, nose, mouth, throat, and face: Denies mucositis or sore throat Respiratory: Denies cough, dyspnea or wheezes Cardiovascular: Denies palpitation, chest discomfort or lower extremity swelling Gastrointestinal:  Denies nausea, heartburn or change in bowel habits Skin: Denies abnormal skin rashes Lymphatics: Denies new lymphadenopathy or easy bruising Neurological:Denies numbness, tingling or new weaknesses Behavioral/Psych: Mood is stable, no new changes  All other systems were reviewed with the patient and are negative.  PHYSICAL EXAMINATION: ECOG PERFORMANCE STATUS: 0  Blood pressure 130/58, pulse 65, temperature 97.8 F (36.6 C), temperature source Oral, resp. rate 18, Stephenson 5\' 1"  (1.549 m), weight 177 lb 1.6 oz (80.332 kg).  GENERAL:alert, no distress and comfortable, elderly male SKIN: skin color, texture, turgor are normal, no rashes or significant lesions EYES: normal, Conjunctiva are pink and non-injected, sclera clear; L eye stye OROPHARYNX:no exudate, no erythema and lips, buccal mucosa, and tongue normal  NECK: supple, thyroid normal size, non-tender, without nodularity LYMPH:  no palpable lymphadenopathy in the cervical, axillary or supraclavicular LUNGS: clear to auscultation and percussion with normal breathing effort HEART: regular rate & rhythm and no murmurs and no lower extremity edema ABDOMEN:abdomen soft, non-tender and normal bowel sounds Musculoskeletal:no cyanosis  of digits and no clubbing ; positive arthritic changes in both  hands bilaterally. NEURO: alert & oriented x 3 with fluent speech, no focal motor/sensory deficits   LABORATORY DATA: No results found for this or any previous visit (from the past 48 hour(s)).  Labs:  Lab Results  Component Value Date   WBC 5.9 11/19/2013   HGB 14.1 11/19/2013   HCT 45.4 11/19/2013   MCV 76.4* 11/19/2013   PLT 127* 11/19/2013   NEUTROABS 3.5 11/19/2013      Chemistry      Component Value Date/Time   NA 141 11/19/2013 0946   NA 139 05/22/2011 1039   K 4.4 11/19/2013 0946   K 4.5 05/22/2011 1039   CL 108* 05/21/2012 1040   CL 105 05/22/2011 1039   CO2 26 11/19/2013 0946   CO2 26 05/22/2011 1039   BUN 34.0* 11/19/2013 0946   BUN 25* 05/22/2011 1039   CREATININE 1.9* 11/19/2013 0946   CREATININE 1.76* 05/22/2011 1039      Component Value Date/Time   CALCIUM 9.5 11/19/2013 0946   CALCIUM 9.3 05/22/2011 1039   ALKPHOS 53 05/28/2013 1458   ALKPHOS 43 05/22/2011 1039   AST 23 05/28/2013 1458   AST 16 05/22/2011 1039   ALT 19 05/28/2013 1458   ALT 12 05/22/2011 1039   BILITOT 0.47 05/28/2013 1458   BILITOT 0.6 05/22/2011 1039      ASSESSMENT: Aaron Stephenson 80 y.o. male with a history of Polycythemia, secondary - Plan: CBC with Differential, Phlebotomy therapeutic  PLAN:  1. Polycythemia Vera. --Aaron Stephenson continues to do quite well. Today his hematocrit is 45.4. He is not getting TP today, he is not symptomatic  We are setting Aaron Stephenson up as we have done in the past with CBCs every 3 months and a 1-unit phlebotomy whenever his hematocrit is greater than or equal to 45%. Patient will return to clinic in 6 months, at which time we will check CBC and chemistries.  All questions were answered. The patient knows to call the clinic with any problems, questions or concerns. We can certainly see the patient much sooner if necessary.  I spent 10 minutes counseling the patient face to face. The total time spent in the appointment was 15 minutes.    Bernadene Bell, MD Medical  Hematologist/Oncologist [place] Pager: [phone] Office No: [phone]

## 2013-11-25 DIAGNOSIS — H01003 Unspecified blepharitis right eye, unspecified eyelid: Secondary | ICD-10-CM | POA: Diagnosis not present

## 2013-11-26 DIAGNOSIS — E1122 Type 2 diabetes mellitus with diabetic chronic kidney disease: Secondary | ICD-10-CM | POA: Diagnosis not present

## 2013-11-26 DIAGNOSIS — K219 Gastro-esophageal reflux disease without esophagitis: Secondary | ICD-10-CM | POA: Diagnosis not present

## 2013-11-26 DIAGNOSIS — E785 Hyperlipidemia, unspecified: Secondary | ICD-10-CM | POA: Diagnosis not present

## 2013-11-26 DIAGNOSIS — K5900 Constipation, unspecified: Secondary | ICD-10-CM | POA: Diagnosis not present

## 2013-11-26 DIAGNOSIS — I10 Essential (primary) hypertension: Secondary | ICD-10-CM | POA: Diagnosis not present

## 2013-11-26 DIAGNOSIS — N400 Enlarged prostate without lower urinary tract symptoms: Secondary | ICD-10-CM | POA: Diagnosis not present

## 2013-11-26 DIAGNOSIS — M792 Neuralgia and neuritis, unspecified: Secondary | ICD-10-CM | POA: Diagnosis not present

## 2013-12-01 ENCOUNTER — Ambulatory Visit (HOSPITAL_BASED_OUTPATIENT_CLINIC_OR_DEPARTMENT_OTHER): Payer: Medicare Other

## 2013-12-01 VITALS — BP 109/49 | HR 54 | Temp 98.1°F | Resp 18

## 2013-12-01 DIAGNOSIS — D45 Polycythemia vera: Secondary | ICD-10-CM | POA: Diagnosis not present

## 2013-12-01 NOTE — Patient Instructions (Signed)

## 2013-12-01 NOTE — Progress Notes (Signed)
Phlebotomy performed on patient. 500 grams removed from patient. Patient tolerated well. Snacks/drinks provided before and after procedure.

## 2013-12-15 DIAGNOSIS — M25562 Pain in left knee: Secondary | ICD-10-CM | POA: Diagnosis not present

## 2013-12-15 DIAGNOSIS — M25561 Pain in right knee: Secondary | ICD-10-CM | POA: Diagnosis not present

## 2013-12-22 DIAGNOSIS — R972 Elevated prostate specific antigen [PSA]: Secondary | ICD-10-CM | POA: Diagnosis not present

## 2013-12-22 DIAGNOSIS — N3941 Urge incontinence: Secondary | ICD-10-CM | POA: Diagnosis not present

## 2013-12-22 DIAGNOSIS — N320 Bladder-neck obstruction: Secondary | ICD-10-CM | POA: Diagnosis not present

## 2013-12-22 DIAGNOSIS — Q6100 Congenital renal cyst, unspecified: Secondary | ICD-10-CM | POA: Diagnosis not present

## 2013-12-22 DIAGNOSIS — N183 Chronic kidney disease, stage 3 (moderate): Secondary | ICD-10-CM | POA: Diagnosis not present

## 2013-12-22 DIAGNOSIS — Q6101 Congenital single renal cyst: Secondary | ICD-10-CM | POA: Diagnosis not present

## 2013-12-22 DIAGNOSIS — Z7982 Long term (current) use of aspirin: Secondary | ICD-10-CM | POA: Diagnosis not present

## 2013-12-22 DIAGNOSIS — R351 Nocturia: Secondary | ICD-10-CM | POA: Diagnosis not present

## 2013-12-22 DIAGNOSIS — C61 Malignant neoplasm of prostate: Secondary | ICD-10-CM | POA: Diagnosis not present

## 2014-01-05 DIAGNOSIS — M25561 Pain in right knee: Secondary | ICD-10-CM | POA: Diagnosis not present

## 2014-01-05 DIAGNOSIS — M25461 Effusion, right knee: Secondary | ICD-10-CM | POA: Diagnosis not present

## 2014-01-27 DIAGNOSIS — H40013 Open angle with borderline findings, low risk, bilateral: Secondary | ICD-10-CM | POA: Diagnosis not present

## 2014-02-03 DIAGNOSIS — R600 Localized edema: Secondary | ICD-10-CM | POA: Diagnosis not present

## 2014-02-11 DIAGNOSIS — K219 Gastro-esophageal reflux disease without esophagitis: Secondary | ICD-10-CM | POA: Diagnosis not present

## 2014-02-11 DIAGNOSIS — E1121 Type 2 diabetes mellitus with diabetic nephropathy: Secondary | ICD-10-CM | POA: Diagnosis not present

## 2014-02-11 DIAGNOSIS — N183 Chronic kidney disease, stage 3 (moderate): Secondary | ICD-10-CM | POA: Diagnosis not present

## 2014-02-11 DIAGNOSIS — R609 Edema, unspecified: Secondary | ICD-10-CM | POA: Diagnosis not present

## 2014-02-11 DIAGNOSIS — E668 Other obesity: Secondary | ICD-10-CM | POA: Diagnosis not present

## 2014-02-11 DIAGNOSIS — I10 Essential (primary) hypertension: Secondary | ICD-10-CM | POA: Diagnosis not present

## 2014-02-11 DIAGNOSIS — E784 Other hyperlipidemia: Secondary | ICD-10-CM | POA: Diagnosis not present

## 2014-02-11 DIAGNOSIS — D45 Polycythemia vera: Secondary | ICD-10-CM | POA: Diagnosis not present

## 2014-02-17 ENCOUNTER — Telehealth: Payer: Self-pay | Admitting: Hematology

## 2014-02-17 NOTE — Telephone Encounter (Signed)
returned call and s.w pt .Marland Kitchen..pt decided not to r/s

## 2014-02-18 ENCOUNTER — Other Ambulatory Visit: Payer: Medicare Other

## 2014-03-05 DIAGNOSIS — M25561 Pain in right knee: Secondary | ICD-10-CM | POA: Diagnosis not present

## 2014-03-05 DIAGNOSIS — M1712 Unilateral primary osteoarthritis, left knee: Secondary | ICD-10-CM | POA: Diagnosis not present

## 2014-03-05 DIAGNOSIS — M25562 Pain in left knee: Secondary | ICD-10-CM | POA: Diagnosis not present

## 2014-03-05 DIAGNOSIS — M1711 Unilateral primary osteoarthritis, right knee: Secondary | ICD-10-CM | POA: Diagnosis not present

## 2014-03-13 ENCOUNTER — Telehealth: Payer: Self-pay | Admitting: Internal Medicine

## 2014-03-16 ENCOUNTER — Telehealth: Payer: Self-pay | Admitting: *Deleted

## 2014-03-16 NOTE — Telephone Encounter (Signed)
I have adjusted appt 

## 2014-04-07 DIAGNOSIS — N3941 Urge incontinence: Secondary | ICD-10-CM | POA: Diagnosis not present

## 2014-04-07 DIAGNOSIS — Q6100 Congenital renal cyst, unspecified: Secondary | ICD-10-CM | POA: Diagnosis not present

## 2014-04-07 DIAGNOSIS — R351 Nocturia: Secondary | ICD-10-CM | POA: Diagnosis not present

## 2014-04-07 DIAGNOSIS — N189 Chronic kidney disease, unspecified: Secondary | ICD-10-CM | POA: Diagnosis not present

## 2014-04-07 DIAGNOSIS — R972 Elevated prostate specific antigen [PSA]: Secondary | ICD-10-CM | POA: Diagnosis not present

## 2014-04-07 DIAGNOSIS — N289 Disorder of kidney and ureter, unspecified: Secondary | ICD-10-CM | POA: Diagnosis not present

## 2014-04-07 DIAGNOSIS — C61 Malignant neoplasm of prostate: Secondary | ICD-10-CM | POA: Diagnosis not present

## 2014-04-07 DIAGNOSIS — R399 Unspecified symptoms and signs involving the genitourinary system: Secondary | ICD-10-CM | POA: Diagnosis not present

## 2014-04-07 DIAGNOSIS — N320 Bladder-neck obstruction: Secondary | ICD-10-CM | POA: Diagnosis not present

## 2014-05-01 DIAGNOSIS — R339 Retention of urine, unspecified: Secondary | ICD-10-CM | POA: Diagnosis not present

## 2014-05-01 DIAGNOSIS — N320 Bladder-neck obstruction: Secondary | ICD-10-CM | POA: Diagnosis not present

## 2014-05-01 DIAGNOSIS — C61 Malignant neoplasm of prostate: Secondary | ICD-10-CM | POA: Diagnosis not present

## 2014-05-05 DIAGNOSIS — N183 Chronic kidney disease, stage 3 (moderate): Secondary | ICD-10-CM | POA: Diagnosis not present

## 2014-05-05 DIAGNOSIS — N2581 Secondary hyperparathyroidism of renal origin: Secondary | ICD-10-CM | POA: Diagnosis not present

## 2014-05-05 DIAGNOSIS — E611 Iron deficiency: Secondary | ICD-10-CM | POA: Diagnosis not present

## 2014-05-07 DIAGNOSIS — Z23 Encounter for immunization: Secondary | ICD-10-CM | POA: Diagnosis not present

## 2014-05-07 DIAGNOSIS — W19XXXA Unspecified fall, initial encounter: Secondary | ICD-10-CM | POA: Diagnosis not present

## 2014-05-07 DIAGNOSIS — M79642 Pain in left hand: Secondary | ICD-10-CM | POA: Diagnosis not present

## 2014-05-07 DIAGNOSIS — S61412A Laceration without foreign body of left hand, initial encounter: Secondary | ICD-10-CM | POA: Diagnosis not present

## 2014-05-07 DIAGNOSIS — S61112A Laceration without foreign body of left thumb with damage to nail, initial encounter: Secondary | ICD-10-CM | POA: Diagnosis not present

## 2014-05-11 DIAGNOSIS — I10 Essential (primary) hypertension: Secondary | ICD-10-CM | POA: Diagnosis not present

## 2014-05-11 DIAGNOSIS — E1165 Type 2 diabetes mellitus with hyperglycemia: Secondary | ICD-10-CM | POA: Diagnosis not present

## 2014-05-11 DIAGNOSIS — S61002D Unspecified open wound of left thumb without damage to nail, subsequent encounter: Secondary | ICD-10-CM | POA: Diagnosis not present

## 2014-05-13 DIAGNOSIS — I129 Hypertensive chronic kidney disease with stage 1 through stage 4 chronic kidney disease, or unspecified chronic kidney disease: Secondary | ICD-10-CM | POA: Diagnosis not present

## 2014-05-13 DIAGNOSIS — N183 Chronic kidney disease, stage 3 (moderate): Secondary | ICD-10-CM | POA: Diagnosis not present

## 2014-05-13 DIAGNOSIS — N2581 Secondary hyperparathyroidism of renal origin: Secondary | ICD-10-CM | POA: Diagnosis not present

## 2014-05-13 DIAGNOSIS — N281 Cyst of kidney, acquired: Secondary | ICD-10-CM | POA: Diagnosis not present

## 2014-05-14 DIAGNOSIS — T148 Other injury of unspecified body region: Secondary | ICD-10-CM | POA: Diagnosis not present

## 2014-05-14 DIAGNOSIS — Z4802 Encounter for removal of sutures: Secondary | ICD-10-CM | POA: Diagnosis not present

## 2014-05-19 ENCOUNTER — Other Ambulatory Visit: Payer: Self-pay | Admitting: *Deleted

## 2014-05-19 DIAGNOSIS — D45 Polycythemia vera: Secondary | ICD-10-CM

## 2014-05-20 ENCOUNTER — Other Ambulatory Visit: Payer: Medicare Other

## 2014-05-20 ENCOUNTER — Ambulatory Visit: Payer: Medicare Other | Admitting: Internal Medicine

## 2014-05-20 ENCOUNTER — Ambulatory Visit: Payer: Medicare Other

## 2014-05-20 DIAGNOSIS — M25562 Pain in left knee: Secondary | ICD-10-CM | POA: Diagnosis not present

## 2014-05-20 DIAGNOSIS — M1711 Unilateral primary osteoarthritis, right knee: Secondary | ICD-10-CM | POA: Diagnosis not present

## 2014-05-20 DIAGNOSIS — M25561 Pain in right knee: Secondary | ICD-10-CM | POA: Diagnosis not present

## 2014-05-20 DIAGNOSIS — M1712 Unilateral primary osteoarthritis, left knee: Secondary | ICD-10-CM | POA: Diagnosis not present

## 2014-06-05 DIAGNOSIS — R339 Retention of urine, unspecified: Secondary | ICD-10-CM | POA: Diagnosis not present

## 2014-06-05 DIAGNOSIS — N320 Bladder-neck obstruction: Secondary | ICD-10-CM | POA: Diagnosis not present

## 2014-06-05 DIAGNOSIS — C61 Malignant neoplasm of prostate: Secondary | ICD-10-CM | POA: Diagnosis not present

## 2014-06-22 DIAGNOSIS — R339 Retention of urine, unspecified: Secondary | ICD-10-CM | POA: Diagnosis not present

## 2014-06-22 DIAGNOSIS — Q6100 Congenital renal cyst, unspecified: Secondary | ICD-10-CM | POA: Diagnosis not present

## 2014-06-22 DIAGNOSIS — N183 Chronic kidney disease, stage 3 (moderate): Secondary | ICD-10-CM | POA: Diagnosis not present

## 2014-07-01 DIAGNOSIS — C61 Malignant neoplasm of prostate: Secondary | ICD-10-CM | POA: Diagnosis not present

## 2014-07-01 DIAGNOSIS — N401 Enlarged prostate with lower urinary tract symptoms: Secondary | ICD-10-CM | POA: Diagnosis not present

## 2014-07-01 DIAGNOSIS — R3916 Straining to void: Secondary | ICD-10-CM | POA: Diagnosis not present

## 2014-07-10 DIAGNOSIS — M1712 Unilateral primary osteoarthritis, left knee: Secondary | ICD-10-CM | POA: Diagnosis not present

## 2014-07-10 DIAGNOSIS — M1711 Unilateral primary osteoarthritis, right knee: Secondary | ICD-10-CM | POA: Diagnosis not present

## 2014-07-20 DIAGNOSIS — R351 Nocturia: Secondary | ICD-10-CM | POA: Diagnosis not present

## 2014-07-27 DIAGNOSIS — E1122 Type 2 diabetes mellitus with diabetic chronic kidney disease: Secondary | ICD-10-CM | POA: Diagnosis not present

## 2014-07-27 DIAGNOSIS — N400 Enlarged prostate without lower urinary tract symptoms: Secondary | ICD-10-CM | POA: Diagnosis not present

## 2014-07-27 DIAGNOSIS — I10 Essential (primary) hypertension: Secondary | ICD-10-CM | POA: Diagnosis not present

## 2014-07-27 DIAGNOSIS — G4700 Insomnia, unspecified: Secondary | ICD-10-CM | POA: Diagnosis not present

## 2014-07-27 DIAGNOSIS — K219 Gastro-esophageal reflux disease without esophagitis: Secondary | ICD-10-CM | POA: Diagnosis not present

## 2014-07-27 DIAGNOSIS — E785 Hyperlipidemia, unspecified: Secondary | ICD-10-CM | POA: Diagnosis not present

## 2014-07-27 DIAGNOSIS — D45 Polycythemia vera: Secondary | ICD-10-CM | POA: Diagnosis not present

## 2014-07-27 DIAGNOSIS — K5900 Constipation, unspecified: Secondary | ICD-10-CM | POA: Diagnosis not present

## 2014-07-27 DIAGNOSIS — M1990 Unspecified osteoarthritis, unspecified site: Secondary | ICD-10-CM | POA: Diagnosis not present

## 2014-07-27 DIAGNOSIS — N189 Chronic kidney disease, unspecified: Secondary | ICD-10-CM | POA: Diagnosis not present

## 2014-07-31 DIAGNOSIS — N3941 Urge incontinence: Secondary | ICD-10-CM | POA: Diagnosis not present

## 2014-07-31 DIAGNOSIS — R339 Retention of urine, unspecified: Secondary | ICD-10-CM | POA: Diagnosis not present

## 2014-07-31 DIAGNOSIS — N401 Enlarged prostate with lower urinary tract symptoms: Secondary | ICD-10-CM | POA: Diagnosis not present

## 2014-07-31 DIAGNOSIS — R351 Nocturia: Secondary | ICD-10-CM | POA: Diagnosis not present

## 2014-07-31 DIAGNOSIS — R3916 Straining to void: Secondary | ICD-10-CM | POA: Diagnosis not present

## 2014-08-04 DIAGNOSIS — M25461 Effusion, right knee: Secondary | ICD-10-CM | POA: Diagnosis not present

## 2014-08-11 DIAGNOSIS — M1712 Unilateral primary osteoarthritis, left knee: Secondary | ICD-10-CM | POA: Diagnosis not present

## 2014-08-18 DIAGNOSIS — M1712 Unilateral primary osteoarthritis, left knee: Secondary | ICD-10-CM | POA: Diagnosis not present

## 2014-08-25 DIAGNOSIS — M1712 Unilateral primary osteoarthritis, left knee: Secondary | ICD-10-CM | POA: Diagnosis not present

## 2014-09-01 DIAGNOSIS — R339 Retention of urine, unspecified: Secondary | ICD-10-CM | POA: Diagnosis not present

## 2014-09-01 DIAGNOSIS — R351 Nocturia: Secondary | ICD-10-CM | POA: Diagnosis not present

## 2014-09-01 DIAGNOSIS — R3915 Urgency of urination: Secondary | ICD-10-CM | POA: Diagnosis not present

## 2014-09-07 DIAGNOSIS — N189 Chronic kidney disease, unspecified: Secondary | ICD-10-CM | POA: Diagnosis not present

## 2014-09-07 DIAGNOSIS — N400 Enlarged prostate without lower urinary tract symptoms: Secondary | ICD-10-CM | POA: Diagnosis not present

## 2014-09-07 DIAGNOSIS — I10 Essential (primary) hypertension: Secondary | ICD-10-CM | POA: Diagnosis not present

## 2014-09-07 DIAGNOSIS — G4700 Insomnia, unspecified: Secondary | ICD-10-CM | POA: Diagnosis not present

## 2014-09-07 DIAGNOSIS — M1990 Unspecified osteoarthritis, unspecified site: Secondary | ICD-10-CM | POA: Diagnosis not present

## 2014-09-07 DIAGNOSIS — E1122 Type 2 diabetes mellitus with diabetic chronic kidney disease: Secondary | ICD-10-CM | POA: Diagnosis not present

## 2014-09-07 DIAGNOSIS — D45 Polycythemia vera: Secondary | ICD-10-CM | POA: Diagnosis not present

## 2014-09-07 DIAGNOSIS — K219 Gastro-esophageal reflux disease without esophagitis: Secondary | ICD-10-CM | POA: Diagnosis not present

## 2014-09-07 DIAGNOSIS — E785 Hyperlipidemia, unspecified: Secondary | ICD-10-CM | POA: Diagnosis not present

## 2014-09-11 DIAGNOSIS — R3915 Urgency of urination: Secondary | ICD-10-CM | POA: Diagnosis not present

## 2014-09-11 DIAGNOSIS — R3916 Straining to void: Secondary | ICD-10-CM | POA: Diagnosis not present

## 2014-09-11 DIAGNOSIS — N390 Urinary tract infection, site not specified: Secondary | ICD-10-CM | POA: Diagnosis not present

## 2014-09-11 DIAGNOSIS — N401 Enlarged prostate with lower urinary tract symptoms: Secondary | ICD-10-CM | POA: Diagnosis not present

## 2014-09-18 ENCOUNTER — Emergency Department (HOSPITAL_BASED_OUTPATIENT_CLINIC_OR_DEPARTMENT_OTHER): Payer: Medicare Other

## 2014-09-18 ENCOUNTER — Inpatient Hospital Stay (HOSPITAL_BASED_OUTPATIENT_CLINIC_OR_DEPARTMENT_OTHER)
Admission: EM | Admit: 2014-09-18 | Discharge: 2014-09-21 | DRG: 282 | Disposition: A | Discharge status: Home / Self Care | Payer: Medicare Other | Attending: Cardiology | Admitting: Cardiology

## 2014-09-18 ENCOUNTER — Encounter (HOSPITAL_BASED_OUTPATIENT_CLINIC_OR_DEPARTMENT_OTHER): Payer: Self-pay

## 2014-09-18 DIAGNOSIS — E785 Hyperlipidemia, unspecified: Secondary | ICD-10-CM | POA: Diagnosis present

## 2014-09-18 DIAGNOSIS — E1122 Type 2 diabetes mellitus with diabetic chronic kidney disease: Secondary | ICD-10-CM | POA: Diagnosis present

## 2014-09-18 DIAGNOSIS — I872 Venous insufficiency (chronic) (peripheral): Secondary | ICD-10-CM | POA: Diagnosis present

## 2014-09-18 DIAGNOSIS — R001 Bradycardia, unspecified: Secondary | ICD-10-CM | POA: Diagnosis present

## 2014-09-18 DIAGNOSIS — I440 Atrioventricular block, first degree: Secondary | ICD-10-CM | POA: Diagnosis present

## 2014-09-18 DIAGNOSIS — I214 Non-ST elevation (NSTEMI) myocardial infarction: Secondary | ICD-10-CM | POA: Diagnosis present

## 2014-09-18 DIAGNOSIS — N390 Urinary tract infection, site not specified: Secondary | ICD-10-CM | POA: Diagnosis present

## 2014-09-18 DIAGNOSIS — I209 Angina pectoris, unspecified: Secondary | ICD-10-CM | POA: Diagnosis not present

## 2014-09-18 DIAGNOSIS — R9430 Abnormal result of cardiovascular function study, unspecified: Secondary | ICD-10-CM

## 2014-09-18 DIAGNOSIS — N400 Enlarged prostate without lower urinary tract symptoms: Secondary | ICD-10-CM | POA: Diagnosis present

## 2014-09-18 DIAGNOSIS — R079 Chest pain, unspecified: Secondary | ICD-10-CM | POA: Diagnosis not present

## 2014-09-18 DIAGNOSIS — R778 Other specified abnormalities of plasma proteins: Secondary | ICD-10-CM

## 2014-09-18 DIAGNOSIS — N183 Chronic kidney disease, stage 3 (moderate): Secondary | ICD-10-CM | POA: Diagnosis present

## 2014-09-18 DIAGNOSIS — M1990 Unspecified osteoarthritis, unspecified site: Secondary | ICD-10-CM | POA: Diagnosis present

## 2014-09-18 DIAGNOSIS — E1129 Type 2 diabetes mellitus with other diabetic kidney complication: Secondary | ICD-10-CM

## 2014-09-18 DIAGNOSIS — D45 Polycythemia vera: Secondary | ICD-10-CM | POA: Diagnosis present

## 2014-09-18 DIAGNOSIS — R7989 Other specified abnormal findings of blood chemistry: Secondary | ICD-10-CM

## 2014-09-18 DIAGNOSIS — Z7982 Long term (current) use of aspirin: Secondary | ICD-10-CM

## 2014-09-18 DIAGNOSIS — R7889 Finding of other specified substances, not normally found in blood: Secondary | ICD-10-CM | POA: Diagnosis not present

## 2014-09-18 DIAGNOSIS — I1310 Hypertensive heart and chronic kidney disease without heart failure, with stage 1 through stage 4 chronic kidney disease, or unspecified chronic kidney disease: Secondary | ICD-10-CM | POA: Diagnosis present

## 2014-09-18 DIAGNOSIS — Z96641 Presence of right artificial hip joint: Secondary | ICD-10-CM | POA: Diagnosis present

## 2014-09-18 DIAGNOSIS — I5189 Other ill-defined heart diseases: Secondary | ICD-10-CM | POA: Diagnosis not present

## 2014-09-18 LAB — COMPREHENSIVE METABOLIC PANEL
ALT: 16 U/L — ABNORMAL LOW (ref 17–63)
AST: 22 U/L (ref 15–41)
Albumin: 3.9 g/dL (ref 3.5–5.0)
Alkaline Phosphatase: 49 U/L (ref 38–126)
Anion gap: 8 (ref 5–15)
BILIRUBIN TOTAL: 1 mg/dL (ref 0.3–1.2)
BUN: 38 mg/dL — AB (ref 6–20)
CO2: 26 mmol/L (ref 22–32)
Calcium: 9.9 mg/dL (ref 8.9–10.3)
Chloride: 104 mmol/L (ref 101–111)
Creatinine, Ser: 1.63 mg/dL — ABNORMAL HIGH (ref 0.61–1.24)
GFR calc Af Amer: 42 mL/min — ABNORMAL LOW (ref >60)
GFR calc non Af Amer: 36 mL/min — ABNORMAL LOW (ref >60)
Glucose, Bld: 217 mg/dL — ABNORMAL HIGH (ref 65–99)
POTASSIUM: 4.3 mmol/L (ref 3.5–5.1)
Sodium: 138 mmol/L (ref 135–145)
Total Protein: 7.3 g/dL (ref 6.5–8.1)

## 2014-09-18 LAB — CBC
HEMATOCRIT: 46.7 % (ref 39.0–52.0)
HEMOGLOBIN: 15.3 g/dL (ref 13.0–17.0)
MCH: 26.2 pg (ref 26.0–34.0)
MCHC: 32.8 g/dL (ref 30.0–36.0)
MCV: 80 fL (ref 78.0–100.0)
PLATELETS: 143 10*3/uL — AB (ref 150–400)
RBC: 5.84 MIL/uL — AB (ref 4.22–5.81)
RDW: 16.7 % — ABNORMAL HIGH (ref 11.5–15.5)
WBC: 8 10*3/uL (ref 4.0–10.5)

## 2014-09-18 LAB — APTT: APTT: 32 s (ref 24–37)

## 2014-09-18 LAB — PROTIME-INR
INR: 1.04 (ref 0.00–1.49)
PROTHROMBIN TIME: 13.8 s (ref 11.6–15.2)

## 2014-09-18 LAB — TROPONIN I: TROPONIN I: 0.11 ng/mL — AB (ref <0.031)

## 2014-09-18 MED ORDER — HEPARIN (PORCINE) IN NACL 100-0.45 UNIT/ML-% IJ SOLN
12.0000 [IU]/kg/h | INTRAMUSCULAR | Status: DC
  Administered 2014-09-18: 12 [IU]/kg/h via INTRAVENOUS
  Filled 2014-09-18: qty 250

## 2014-09-18 MED ORDER — HEPARIN (PORCINE) IN NACL 100-0.45 UNIT/ML-% IJ SOLN
1000.0000 [IU]/h | INTRAMUSCULAR | Status: DC
  Administered 2014-09-18: 850 [IU]/h via INTRAVENOUS
  Filled 2014-09-18: qty 250

## 2014-09-18 MED ORDER — ASPIRIN 81 MG PO CHEW
324.0000 mg | CHEWABLE_TABLET | Freq: Once | ORAL | Status: AC
  Administered 2014-09-18: 324 mg via ORAL
  Filled 2014-09-18: qty 4

## 2014-09-18 MED ORDER — HEPARIN SODIUM (PORCINE) 5000 UNIT/ML IJ SOLN
4000.0000 [IU] | Freq: Once | INTRAMUSCULAR | Status: AC
  Administered 2014-09-18: 4000 [IU] via INTRAVENOUS

## 2014-09-18 NOTE — ED Notes (Signed)
Called m-stemi on patient via carelink

## 2014-09-18 NOTE — ED Notes (Signed)
Attempt to call report nurse not available 

## 2014-09-18 NOTE — ED Notes (Signed)
Multiple attempts made to call report to [facility] nurse not availabel

## 2014-09-18 NOTE — ED Provider Notes (Signed)
CSN: [id number]     Arrival date & time 09/18/14  1808 History   First MD Initiated Contact with Patient 09/18/14 1837     Chief Complaint  Patient presents with  . Chest Pain   HPI 81 year old male who presents with chest pain. He has a history of polycythemia vera, type 2 diabetes, hypertension, hyperlipidemia, and myeloproliferative disease. He denies prior history of MI or need for LHC in the past. Reports that he was started on an antibiotic for urinary tract infection 5 days ago. He's noticed that since then he had initially developed brief episodes of chest tightness, and felt mildly unwell. He reports that at noon today he developed chest pain, described as a 20 pound weight sitting on his chest. Pain was nonradiating and was persistent until about 3:57 PM. He has never had pain like this before. Denies pain with pleuritic in nature. Denies that it was worse with exertion. Denies any associated shortness of breath, lightheadedness, syncope, palpitations, abdominal pain, diaphoresis, nausea, vomiting, or diarrhea.    Past Medical History  Diagnosis Date  . Polycythemia vera(238.4) 02/20/2011  . DM (diabetes mellitus), type 2   . HTN (hypertension)   . OA (osteoarthritis)   . Hyperlipidemia   . Esophagitis   . GERD (gastroesophageal reflux disease)   . Hiatal hernia   . Migraine   . Vertigo   . Thrombocytopenia   . Myeloproliferative disease   . Spinal stenosis, lumbar    Past Surgical History  Procedure Laterality Date  . Total hip arthroplasty  1993    right  . Lower leg soft tissue tumor excision     Family History  Problem Relation Age of Onset  . Diabetes Mother   . Hypertension Mother   . Cancer Father   . Diabetes Brother   . Kidney failure Brother    History  Substance Use Topics  . Smoking status: Never Smoker   . Smokeless tobacco: Never Used  . Alcohol Use: Yes     Comment: rarely    Review of Systems  Constitutional: Negative for fever, chills and  diaphoresis.  HENT: Negative for congestion and sore throat.   Respiratory: Positive for chest tightness. Negative for shortness of breath.   Cardiovascular: Negative for chest pain.  Gastrointestinal: Negative for abdominal pain and blood in stool.  Genitourinary: Negative for difficulty urinating.  Musculoskeletal: Negative for back pain.  Neurological: Negative for syncope and light-headedness.  Hematological: Does not bruise/bleed easily.  Psychiatric/Behavioral: Negative for confusion.      Allergies  Penicillins; Other; and Tramadol  Home Medications   Prior to Admission medications   Medication Sig Start Date End Date Taking? Authorizing Provider  ALPRAZolam Duanne Moron) 0.5 MG tablet Take 0.5 mg by mouth daily.    Historical Provider, MD  Ascorbic Acid (VITAMIN C) 1000 MG tablet Take 1,000 mg by mouth daily.    Historical Provider, MD  aspirin 325 MG EC tablet Take 325 mg by mouth daily.    Historical Provider, MD  calcium carbonate (OS-CAL) 600 MG TABS Take 600 mg by mouth 2 (two) times daily with a meal.    Historical Provider, MD  doxylamine, Sleep, (UNISOM) 25 MG tablet Take 25 mg by mouth at bedtime as needed.    Historical Provider, MD  dutasteride (AVODART) 0.5 MG capsule Take 0.5 mg by mouth daily.    Historical Provider, MD  esomeprazole (NEXIUM) 40 MG capsule Take 40 mg by mouth daily before breakfast.    Historical  Provider, MD  fish oil-omega-3 fatty acids 1000 MG capsule Take 600 mg by mouth 2 (two) times daily.    Historical Provider, MD  glipiZIDE (GLUCOTROL XL) 5 MG 24 hr tablet Take 5 mg by mouth daily.    Historical Provider, MD  glucosamine-chondroitin 500-400 MG tablet Take 1 tablet by mouth as needed.    Historical Provider, MD  hydrochlorothiazide (HYDRODIURIL) 12.5 MG tablet Take 12.5 mg by mouth daily.    Historical Provider, MD  imiquimod (ALDARA) 5 % cream Apply topically as needed.  03/05/13   Historical Provider, MD  lisinopril (PRINIVIL,ZESTRIL) 5 MG  tablet Take 5 mg by mouth daily.    Historical Provider, MD  metoprolol (LOPRESSOR) 50 MG tablet Take 25 mg by mouth 2 (two) times daily.    Historical Provider, MD  Multiple Vitamins-Minerals (MULTIVITAMIN WITH MINERALS) tablet Take 1 tablet by mouth daily.    Historical Provider, MD  pravastatin (PRAVACHOL) 20 MG tablet Take 20 mg by mouth daily.    Historical Provider, MD  tamsulosin (FLOMAX) 0.4 MG CAPS capsule Take by mouth.    Historical Provider, MD  traMADol (ULTRAM) 50 MG tablet Take 50 mg by mouth 2 (two) times daily.     Historical Provider, MD  vitamin E (VITAMIN E) 400 UNIT capsule Take 400 Units by mouth daily.    Historical Provider, MD   BP 128/70 mmHg  Pulse 70  Temp(Src) 98.5 F (36.9 C) (Oral)  Resp 18  Ht 5\' 5"  (1.651 m)  Wt 160 lb (72.576 kg)  BMI 26.63 kg/m2  SpO2 98% Physical Exam  Nursing note and vitals reviewed. Physical Exam  Constitutional: Well developed, well nourished, non-toxic, and in no acute distress Head: Normocephalic and atraumatic.  Mouth/Throat: Oropharynx is clear and moist.  Neck: Normal range of motion. Neck supple.  Cardiovascular: Normal rate and regular rhythm.  No appreciable murmur. No edema.  Pulmonary/Chest: Effort normal and breath sounds normal. no chest wall tenderness. Abdominal: Soft. There is no tenderness. There is no rebound and no guarding.  Musculoskeletal: No deformities.  Neurological: Alert, no facial droop, fluent speech, moves all extremities symmetrically Skin: Skin is warm and dry.  Psychiatric: Cooperative   ED Course  Procedures (including critical care time) Labs Review Labs Reviewed  CBC - Abnormal; Notable for the following:    RBC 5.84 (*)    RDW 16.7 (*)    Platelets 143 (*)    All other components within normal limits  COMPREHENSIVE METABOLIC PANEL - Abnormal; Notable for the following:    Glucose, Bld 217 (*)    BUN 38 (*)    Creatinine, Ser 1.63 (*)    ALT 16 (*)    GFR calc non Af Amer 36 (*)     GFR calc Af Amer 42 (*)    All other components within normal limits  TROPONIN I - Abnormal; Notable for the following:    Troponin I 0.11 (*)    All other components within normal limits  PROTIME-INR  APTT  HEPARIN LEVEL (UNFRACTIONATED)  CBC    Imaging Review Dg Chest 2 View  09/18/2014   CLINICAL DATA:  Mid chest pain for 2 days.  EXAM: CHEST  2 VIEW  COMPARISON:  None.  FINDINGS: Cardiac silhouette is borderline enlarged. No mediastinal or hilar masses or evidence of adenopathy.  No lung consolidation or edema. No pleural effusion pneumothorax. Mild linear scarring versus atelectasis projects in the left lung base.  Bony thorax is demineralized. There are  degenerative changes of the thoracic spine and arthropathic changes of the right glenohumeral joint.  IMPRESSION: No acute cardiopulmonary disease.   Electronically Signed   By: Lajean Manes M.D.   On: 09/18/2014 19:23     EKG Interpretation   Date/Time:  Friday September 18 2014 19:24:11 EDT Ventricular Rate:  69 PR Interval:  206 QRS Duration: 100 QT Interval:  442 QTC Calculation: 473 R Axis:   -43 Text Interpretation:  Normal sinus rhythm Left axis deviation Low voltage  QRS Cannot rule out Anteroseptal infarct , age undetermined Abnormal ECG  No significant change since last tracing T-wave flattening in anterior  leads Q-waves in anterior leads Confirmed by Keia Rask MD, Dimitri Dsouza [id number]) on  09/18/2014 7:27:00 PM      MDM   Final diagnoses:  NSTEMI (non-ST elevated myocardial infarction)  Ischemic chest pain   In short, this is an 81 year old male with history of hypertension, hyperlipidemia, and diabetes who presents to emergency department with several hours chest pain. On arrival to our emergency department he is chest pain-free. Vital signs are non-concerning. He is nontoxic and in no acute distress. Cardiopulmonary exam is unremarkable, and he does not appear fluid overloaded. Remainder of exam is unremarkable. Initial EKG  reveals normal sinus rhythm with left axis deviation and low voltage. He has T-wave flattening in the anterior precordial leads with poor R-wave progression. His initial troponin is 0.11, and presentation is concerning for a NSTEMI. Chest x-ray shows no acute cardiopulmonary processes and no evidence of widened mediastinum. Basic blood work also revealed evidence of elevated creatinine to 1.6, but baseline creatinine is typically 1.7-1.9.  He remains chest pain-free in the ED and repeat EKG shows no dynamic changes. He is given a full dose of aspirin, and started on heparin drip. Patient is discussed with Dr. Meda Coffee at [facility] and is admitted to the cardiology service for NSTEMI.  CRITICAL CARE Performed by: Forde Dandy   Total critical care time: 30 minutes  Critical care time was exclusive of separately billable procedures and treating other patients.  Critical care was necessary to treat or prevent imminent or life-threatening deterioration.  Critical care was time spent personally by me on the following activities: development of treatment plan with patient and/or surrogate as well as nursing, discussions with consultants, evaluation of patient's response to treatment, examination of patient, obtaining history from patient or surrogate, ordering and performing treatments and interventions, ordering and review of laboratory studies, ordering and review of radiographic studies, pulse oximetry and re-evaluation of patient's condition.   Forde Dandy, MD 09/18/14 2055

## 2014-09-18 NOTE — ED Notes (Addendum)
PT reports 2 day history of chest pain - centralized, described as heavy pressure. Recently prescribed Macrobid and attributes s/s to that medication. PCP referred pt to ED for evaluation of possible ACS. PT reports he left his home med list at home.

## 2014-09-18 NOTE — Progress Notes (Signed)
ANTICOAGULATION CONSULT NOTE - Initial Consult  Pharmacy Consult for heparin  Indication: chest pain/ACS  Allergies  Allergen Reactions  . Penicillins Hives and Swelling  . Other     "lubriderm pain pill?"  . Tramadol     Patient Measurements: Height: 5\' 5"  (165.1 cm) Weight: 160 lb (72.576 kg) IBW/kg (Calculated) : 61.5 Heparin Dosing Weight: 72.6 kg   Vital Signs: Temp: 98.5 F (36.9 C) (08/05 1814) Temp Source: Oral (08/05 1814) BP: 131/100 mmHg (08/05 1814) Pulse Rate: 74 (08/05 1814)  Labs:  Recent Labs  09/18/14 1811 09/18/14 1915  HGB 15.3  --   HCT 46.7  --   PLT 143*  --   APTT  --  32  LABPROT  --  13.8  INR  --  1.04  CREATININE 1.63*  --   TROPONINI 0.11*  --     Estimated Creatinine Clearance: 27.8 mL/min (by C-G formula based on Cr of 1.63).   Medical History: Past Medical History  Diagnosis Date  . Polycythemia vera(238.4) 02/20/2011  . DM (diabetes mellitus), type 2   . HTN (hypertension)   . OA (osteoarthritis)   . Hyperlipidemia   . Esophagitis   . GERD (gastroesophageal reflux disease)   . Hiatal hernia   . Migraine   . Vertigo   . Thrombocytopenia   . Myeloproliferative disease   . Spinal stenosis, lumbar     Medications:   (Not in a hospital admission)  Assessment: 81 yo male with central chest pain and pressure. Initiating heparin gtt for NSTEMI. CBC stable.   Goal of Therapy:  Heparin level 0.3-0.7 units/ml Monitor platelets by anticoagulation protocol: Yes   Plan:  Heparin bolus 4000 units x1 then 850 units/hr Daily HL, CBC Check confirmatory with AM labs Monitor s/sx bleeding   Hughes Better, PharmD, BCPS Clinical Pharmacist Pager: [phone] 09/18/2014 7:50 PM

## 2014-09-19 ENCOUNTER — Other Ambulatory Visit (HOSPITAL_COMMUNITY): Payer: PRIVATE HEALTH INSURANCE

## 2014-09-19 ENCOUNTER — Encounter (HOSPITAL_COMMUNITY): Payer: Self-pay | Admitting: Cardiology

## 2014-09-19 ENCOUNTER — Inpatient Hospital Stay (HOSPITAL_COMMUNITY): Payer: Medicare Other

## 2014-09-19 DIAGNOSIS — I214 Non-ST elevation (NSTEMI) myocardial infarction: Secondary | ICD-10-CM

## 2014-09-19 DIAGNOSIS — E1129 Type 2 diabetes mellitus with other diabetic kidney complication: Secondary | ICD-10-CM

## 2014-09-19 DIAGNOSIS — N390 Urinary tract infection, site not specified: Secondary | ICD-10-CM | POA: Diagnosis present

## 2014-09-19 HISTORY — DX: Type 2 diabetes mellitus with other diabetic kidney complication: E11.29

## 2014-09-19 HISTORY — DX: Non-ST elevation (NSTEMI) myocardial infarction: I21.4

## 2014-09-19 LAB — URINALYSIS, ROUTINE W REFLEX MICROSCOPIC
Bilirubin Urine: NEGATIVE
Glucose, UA: NEGATIVE mg/dL
Hgb urine dipstick: NEGATIVE
Ketones, ur: NEGATIVE mg/dL
Leukocytes, UA: NEGATIVE
Nitrite: NEGATIVE
Protein, ur: NEGATIVE mg/dL
SPECIFIC GRAVITY, URINE: 1.015 (ref 1.005–1.030)
UROBILINOGEN UA: 1 mg/dL (ref 0.0–1.0)
pH: 6 (ref 5.0–8.0)

## 2014-09-19 LAB — TSH: TSH: 0.619 u[IU]/mL (ref 0.350–4.500)

## 2014-09-19 LAB — COMPREHENSIVE METABOLIC PANEL
ALT: 14 U/L — ABNORMAL LOW (ref 17–63)
ANION GAP: 9 (ref 5–15)
AST: 20 U/L (ref 15–41)
Albumin: 3.2 g/dL — ABNORMAL LOW (ref 3.5–5.0)
Alkaline Phosphatase: 45 U/L (ref 38–126)
BILIRUBIN TOTAL: 1 mg/dL (ref 0.3–1.2)
BUN: 30 mg/dL — AB (ref 6–20)
CHLORIDE: 104 mmol/L (ref 101–111)
CO2: 27 mmol/L (ref 22–32)
Calcium: 9.5 mg/dL (ref 8.9–10.3)
Creatinine, Ser: 1.7 mg/dL — ABNORMAL HIGH (ref 0.61–1.24)
GFR calc Af Amer: 40 mL/min — ABNORMAL LOW (ref >60)
GFR, EST NON AFRICAN AMERICAN: 34 mL/min — AB (ref >60)
Glucose, Bld: 144 mg/dL — ABNORMAL HIGH (ref 65–99)
Potassium: 4 mmol/L (ref 3.5–5.1)
Sodium: 140 mmol/L (ref 135–145)
TOTAL PROTEIN: 5.8 g/dL — AB (ref 6.5–8.1)

## 2014-09-19 LAB — GLUCOSE, CAPILLARY
GLUCOSE-CAPILLARY: 207 mg/dL — AB (ref 65–99)
Glucose-Capillary: 163 mg/dL — ABNORMAL HIGH (ref 65–99)
Glucose-Capillary: 102 mg/dL — ABNORMAL HIGH (ref 65–99)

## 2014-09-19 LAB — LIPID PANEL
CHOLESTEROL: 182 mg/dL (ref 0–200)
HDL: 47 mg/dL (ref >40)
LDL CALC: 112 mg/dL — AB (ref 0–99)
Total CHOL/HDL Ratio: 3.9 RATIO
Triglycerides: 113 mg/dL (ref <150)
VLDL: 23 mg/dL (ref 0–40)

## 2014-09-19 LAB — TROPONIN I
TROPONIN I: 0.11 ng/mL — AB (ref <0.031)
Troponin I: 0.11 ng/mL — ABNORMAL HIGH (ref <0.031)
Troponin I: 0.14 ng/mL — ABNORMAL HIGH (ref <0.031)

## 2014-09-19 LAB — HEPARIN LEVEL (UNFRACTIONATED)
Heparin Unfractionated: 0.24 IU/mL — ABNORMAL LOW (ref 0.30–0.70)
Heparin Unfractionated: 1.2 IU/mL — ABNORMAL HIGH (ref 0.30–0.70)

## 2014-09-19 LAB — PROTIME-INR
INR: 1.33 (ref 0.00–1.49)
Prothrombin Time: 16.6 seconds — ABNORMAL HIGH (ref 11.6–15.2)

## 2014-09-19 LAB — T4, FREE: FREE T4: 1.04 ng/dL (ref 0.61–1.12)

## 2014-09-19 LAB — MAGNESIUM: Magnesium: 2 mg/dL (ref 1.7–2.4)

## 2014-09-19 MED ORDER — ONDANSETRON HCL 4 MG/2ML IJ SOLN: 4.0000 mg | Freq: Four times a day (QID) | INTRAMUSCULAR | Status: DC | PRN

## 2014-09-19 MED ORDER — HYDROCHLOROTHIAZIDE 25 MG PO TABS
12.5000 mg | ORAL_TABLET | Freq: Every day | ORAL | Status: DC
  Administered 2014-09-19 – 2014-09-21 (×3): 12.5 mg via ORAL
  Filled 2014-09-19 (×3): qty 1

## 2014-09-19 MED ORDER — GLIPIZIDE ER 5 MG PO TB24
5.0000 mg | ORAL_TABLET | Freq: Every day | ORAL | Status: DC
  Administered 2014-09-19 – 2014-09-21 (×3): 5 mg via ORAL
  Filled 2014-09-19 (×4): qty 1

## 2014-09-19 MED ORDER — HEPARIN (PORCINE) IN NACL 100-0.45 UNIT/ML-% IJ SOLN
850.0000 [IU]/h | INTRAMUSCULAR | Status: DC
  Administered 2014-09-19: 950 [IU]/h via INTRAVENOUS
  Administered 2014-09-20: 850 [IU]/h via INTRAVENOUS
  Filled 2014-09-19: qty 250

## 2014-09-19 MED ORDER — SODIUM CHLORIDE 0.9 % IV SOLN
INTRAVENOUS | Status: DC
  Administered 2014-09-19: 10 mL/h via INTRAVENOUS
  Administered 2014-09-19: 22:00:00 via INTRAVENOUS

## 2014-09-19 MED ORDER — METOPROLOL TARTRATE 25 MG PO TABS
25.0000 mg | ORAL_TABLET | Freq: Two times a day (BID) | ORAL | Status: DC
  Administered 2014-09-19 – 2014-09-21 (×5): 25 mg via ORAL
  Filled 2014-09-19 (×6): qty 1

## 2014-09-19 MED ORDER — INSULIN ASPART 100 UNIT/ML ~~LOC~~ SOLN
0.0000 [IU] | Freq: Three times a day (TID) | SUBCUTANEOUS | Status: DC
  Administered 2014-09-19: 2 [IU] via SUBCUTANEOUS
  Administered 2014-09-19: 3 [IU] via SUBCUTANEOUS
  Administered 2014-09-20: 1 [IU] via SUBCUTANEOUS
  Administered 2014-09-20: 2 [IU] via SUBCUTANEOUS
  Administered 2014-09-21: 1 [IU] via SUBCUTANEOUS

## 2014-09-19 MED ORDER — VITAMIN C 500 MG PO TABS
1000.0000 mg | ORAL_TABLET | Freq: Every day | ORAL | Status: DC
  Administered 2014-09-19 – 2014-09-21 (×3): 1000 mg via ORAL
  Filled 2014-09-19 (×3): qty 2

## 2014-09-19 MED ORDER — ADULT MULTIVITAMIN W/MINERALS CH
1.0000 | ORAL_TABLET | Freq: Every day | ORAL | Status: DC
  Administered 2014-09-19 – 2014-09-21 (×3): 1 via ORAL
  Filled 2014-09-19 (×5): qty 1

## 2014-09-19 MED ORDER — TRAMADOL HCL 50 MG PO TABS
50.0000 mg | ORAL_TABLET | Freq: Two times a day (BID) | ORAL | Status: DC
  Administered 2014-09-19 – 2014-09-20 (×2): 50 mg via ORAL
  Filled 2014-09-19 (×4): qty 1

## 2014-09-19 MED ORDER — CALCIUM CARBONATE 600 MG PO TABS
600.0000 mg | ORAL_TABLET | Freq: Two times a day (BID) | ORAL | Status: DC
Start: 2014-09-19 — End: 2014-09-19
  Filled 2014-09-19 (×3): qty 1

## 2014-09-19 MED ORDER — INSULIN ASPART 100 UNIT/ML ~~LOC~~ SOLN: 0.0000 [IU] | Freq: Every day | SUBCUTANEOUS | Status: DC

## 2014-09-19 MED ORDER — ACETAMINOPHEN 325 MG PO TABS: 650.0000 mg | ORAL_TABLET | ORAL | Status: DC | PRN

## 2014-09-19 MED ORDER — HEPARIN BOLUS VIA INFUSION
1500.0000 [IU] | Freq: Once | INTRAVENOUS | Status: AC
  Administered 2014-09-19: 1500 [IU] via INTRAVENOUS
  Filled 2014-09-19: qty 1500

## 2014-09-19 MED ORDER — NITROGLYCERIN 0.4 MG SL SUBL: 0.4000 mg | SUBLINGUAL_TABLET | SUBLINGUAL | Status: DC | PRN

## 2014-09-19 MED ORDER — NITROGLYCERIN 2 % TD OINT
0.5000 [in_us] | TOPICAL_OINTMENT | Freq: Three times a day (TID) | TRANSDERMAL | Status: DC
  Administered 2014-09-19 – 2014-09-20 (×4): 0.5 [in_us] via TOPICAL
  Filled 2014-09-19: qty 30

## 2014-09-19 MED ORDER — DUTASTERIDE 0.5 MG PO CAPS
0.5000 mg | ORAL_CAPSULE | Freq: Every day | ORAL | Status: DC
Start: 2014-09-19 — End: 2014-09-21
  Administered 2014-09-19 – 2014-09-21 (×3): 0.5 mg via ORAL
  Filled 2014-09-19 (×3): qty 1

## 2014-09-19 MED ORDER — TAMSULOSIN HCL 0.4 MG PO CAPS
0.4000 mg | ORAL_CAPSULE | Freq: Every day | ORAL | Status: DC
  Administered 2014-09-19 – 2014-09-20 (×2): 0.4 mg via ORAL
  Filled 2014-09-19 (×2): qty 1

## 2014-09-19 MED ORDER — ASPIRIN EC 325 MG PO TBEC: 325.0000 mg | DELAYED_RELEASE_TABLET | Freq: Every day | ORAL | Status: DC

## 2014-09-19 MED ORDER — CALCIUM CARBONATE ANTACID 500 MG PO CHEW
1.0000 | CHEWABLE_TABLET | Freq: Two times a day (BID) | ORAL | Status: DC
  Administered 2014-09-19 – 2014-09-21 (×4): 200 mg via ORAL
  Filled 2014-09-19 (×4): qty 1

## 2014-09-19 MED ORDER — OMEGA-3-ACID ETHYL ESTERS 1 G PO CAPS
1.0000 g | ORAL_CAPSULE | Freq: Every day | ORAL | Status: DC
  Administered 2014-09-19 – 2014-09-21 (×3): 1 g via ORAL
  Filled 2014-09-19 (×5): qty 1

## 2014-09-19 MED ORDER — ALPRAZOLAM 0.5 MG PO TABS
0.5000 mg | ORAL_TABLET | Freq: Every day | ORAL | Status: DC
  Administered 2014-09-19 – 2014-09-21 (×3): 0.5 mg via ORAL
  Filled 2014-09-19 (×3): qty 1

## 2014-09-19 MED ORDER — ASPIRIN EC 81 MG PO TBEC
81.0000 mg | DELAYED_RELEASE_TABLET | Freq: Every day | ORAL | Status: DC
  Administered 2014-09-20 – 2014-09-21 (×2): 81 mg via ORAL
  Filled 2014-09-19 (×2): qty 1

## 2014-09-19 MED ORDER — PRAVASTATIN SODIUM 20 MG PO TABS
20.0000 mg | ORAL_TABLET | Freq: Every day | ORAL | Status: DC
  Administered 2014-09-19 – 2014-09-20 (×2): 20 mg via ORAL
  Filled 2014-09-19 (×2): qty 1

## 2014-09-19 MED ORDER — PANTOPRAZOLE SODIUM 40 MG PO TBEC
40.0000 mg | DELAYED_RELEASE_TABLET | Freq: Every day | ORAL | Status: DC
  Administered 2014-09-19 – 2014-09-21 (×3): 40 mg via ORAL
  Filled 2014-09-19 (×3): qty 1

## 2014-09-19 MED ORDER — LISINOPRIL 5 MG PO TABS: 5.0000 mg | ORAL_TABLET | Freq: Every day | ORAL | Status: DC

## 2014-09-19 NOTE — Progress Notes (Signed)
Utilization Review completed. Tirso Laws RN BSN CM 

## 2014-09-19 NOTE — Progress Notes (Signed)
  Echocardiogram 2D Echocardiogram has been performed.  Lysle Rubens 09/19/2014, 3:23 PM

## 2014-09-19 NOTE — H&P (Signed)
Aaron Stephenson is an 81 y.o. male.    Primary Cardiologist: Dr. Wynonia Lawman  PCP:  Harle Battiest, MD  Chief Complaint: 2 day hx of chest tightness came and went   HPI:   73 yom presented to [place] ER with chest pain, no prior hx of CAD or cardiac cath in the past.   He has a history of polycythemia vera, type 2 diabetes, hypertension, hyperlipidemia, and myeloproliferative disease.  Since starting ABX for UTI he has had brief episodes of chest tightness and did not feel well.  He was placed on a medicine to help him void but after 3 days taken as did not feel well.  Yesterday he developed chest pain that like a knot in his chest and indigestion.  He tried belching did not go away.  The discomfort was Non radiating and no associated symptoms of nausea, diaphoresis or SOB.  It did ease off but when he talked to urology he was instructed to go to ER.   He is active and drives.  He swims 3 days a week and normally can do yard work without difficulty.  He was found to have a mildly elevated troponin and was admitted to the hospital for further evaluation.  He was in evaluated by me for edema and December but never had a follow-up echocardiogram.  It was felt to be due to venous stasis.  EKG is unchanged from then. He normally denies PND, orthopnea, syncopal, or claudication.  Past Medical History  Diagnosis Date  . Polycythemia vera(238.4) 02/20/2011  . DM (diabetes mellitus), type 2   . HTN (hypertension)   . OA (osteoarthritis)   . Hyperlipidemia   . Esophagitis   . GERD (gastroesophageal reflux disease)   . Hiatal hernia   . Migraine   . Vertigo   . Thrombocytopenia   . Myeloproliferative disease   . Spinal stenosis, lumbar     Past Surgical History  Procedure Laterality Date  . Total hip arthroplasty  1993    right  . Lower leg soft tissue tumor excision      Family History  Problem Relation Age of Onset  . Diabetes Mother   . Hypertension Mother   . Cancer Father    . Diabetes Brother   . Kidney failure Brother    Social History:  reports that he has never smoked. He has never used smokeless tobacco. He reports that he drinks alcohol. He reports that he does not use illicit drugs. married with 6 children, active. Drives.   Allergies:  Allergies  Allergen Reactions  . Penicillins Hives and Swelling  . Other     "lubriderm pain pill?"  . Tramadol    Medications Prior to Admission  Medication Sig Dispense Refill  . ALPRAZolam (XANAX) 0.5 MG tablet Take 0.5 mg by mouth daily.    . Ascorbic Acid (VITAMIN C) 1000 MG tablet Take 1,000 mg by mouth daily.    Marland Kitchen aspirin 325 MG EC tablet Take 325 mg by mouth daily.    . calcium carbonate (OS-CAL) 600 MG TABS Take 600 mg by mouth 2 (two) times daily with a meal.    . doxylamine, Sleep, (UNISOM) 25 MG tablet Take 25 mg by mouth at bedtime as needed.    . dutasteride (AVODART) 0.5 MG capsule Take 0.5 mg by mouth daily.    Marland Kitchen esomeprazole (NEXIUM) 40 MG capsule Take 40 mg by mouth daily before breakfast.    .  fish oil-omega-3 fatty acids 1000 MG capsule Take 600 mg by mouth 2 (two) times daily.    Marland Kitchen glipiZIDE (GLUCOTROL XL) 5 MG 24 hr tablet Take 5 mg by mouth daily.    Marland Kitchen glucosamine-chondroitin 500-400 MG tablet Take 1 tablet by mouth as needed.    . hydrochlorothiazide (HYDRODIURIL) 12.5 MG tablet Take 12.5 mg by mouth daily.    . imiquimod (ALDARA) 5 % cream Apply topically as needed.     Marland Kitchen lisinopril (PRINIVIL,ZESTRIL) 5 MG tablet Take 5 mg by mouth daily.    . metoprolol (LOPRESSOR) 50 MG tablet Take 25 mg by mouth 2 (two) times daily.    . Multiple Vitamins-Minerals (MULTIVITAMIN WITH MINERALS) tablet Take 1 tablet by mouth daily.    . pravastatin (PRAVACHOL) 20 MG tablet Take 20 mg by mouth daily.    . tamsulosin (FLOMAX) 0.4 MG CAPS capsule Take by mouth.    . traMADol (ULTRAM) 50 MG tablet Take 50 mg by mouth 2 (two) times daily.     . vitamin E (VITAMIN E) 400 UNIT capsule Take 400 Units by mouth  daily.      Results for orders placed or performed during the hospital encounter of 09/18/14 (from the past 48 hour(s))  CBC     Status: Abnormal   Collection Time: 09/18/14  6:11 PM  Result Value Ref Range   WBC 8.0 4.0 - 10.5 K/uL   RBC 5.84 (H) 4.22 - 5.81 MIL/uL   Hemoglobin 15.3 13.0 - 17.0 g/dL   HCT 46.7 39.0 - 52.0 %   MCV 80.0 78.0 - 100.0 fL   MCH 26.2 26.0 - 34.0 pg   MCHC 32.8 30.0 - 36.0 g/dL   RDW 16.7 (H) 11.5 - 15.5 %   Platelets 143 (L) 150 - 400 K/uL  Comprehensive metabolic panel     Status: Abnormal   Collection Time: 09/18/14  6:11 PM  Result Value Ref Range   Sodium 138 135 - 145 mmol/L   Potassium 4.3 3.5 - 5.1 mmol/L   Chloride 104 101 - 111 mmol/L   CO2 26 22 - 32 mmol/L   Glucose, Bld 217 (H) 65 - 99 mg/dL   BUN 38 (H) 6 - 20 mg/dL   Creatinine, Ser 1.63 (H) 0.61 - 1.24 mg/dL   Calcium 9.9 8.9 - 10.3 mg/dL   Total Protein 7.3 6.5 - 8.1 g/dL   Albumin 3.9 3.5 - 5.0 g/dL   AST 22 15 - 41 U/L   ALT 16 (L) 17 - 63 U/L   Alkaline Phosphatase 49 38 - 126 U/L   Total Bilirubin 1.0 0.3 - 1.2 mg/dL   GFR calc non Af Amer 36 (L) >60 mL/min   GFR calc Af Amer 42 (L) >60 mL/min    Comment: (NOTE) The eGFR has been calculated using the CKD EPI equation. This calculation has not been validated in all clinical situations. eGFR's persistently <60 mL/min signify possible Chronic Kidney Disease.    Anion gap 8 5 - 15  Troponin I     Status: Abnormal   Collection Time: 09/18/14  6:11 PM  Result Value Ref Range   Troponin I 0.11 (H) <0.031 ng/mL    Comment:        PERSISTENTLY INCREASED TROPONIN VALUES IN THE RANGE OF 0.04-0.49 ng/mL CAN BE SEEN IN:       -UNSTABLE ANGINA       -CONGESTIVE HEART FAILURE       -MYOCARDITIS       -  CHEST TRAUMA       -ARRYHTHMIAS       -LATE PRESENTING MYOCARDIAL INFARCTION       -COPD   CLINICAL FOLLOW-UP RECOMMENDED.   Protime-INR     Status: None   Collection Time: 09/18/14  7:15 PM  Result Value Ref Range    Prothrombin Time 13.8 11.6 - 15.2 seconds   INR 1.04 0.00 - 1.49  APTT     Status: None   Collection Time: 09/18/14  7:15 PM  Result Value Ref Range   aPTT 32 24 - 37 seconds   Chest x-ray, no acute cardiopulmonary changes, arthritic changes noted.  ROS: General:no colds or fevers, no weight changes Skin:no rashes or ulcers HEENT:no blurred vision, no congestion CV:see HPI, occ edema of ankles and wears mid calf support hose.  PUL:see HPI GI:no diarrhea constipation or melena, no indigestion SP:QZRAQT UTI, was to begin new antibiotic. MS:+ arthritis.  Significant arthritis of the knees.  Also significant chronic low back pain.   Neuro:no syncope, no lightheadedness Endo:+ diabetes, no thyroid disease Other than as noted above remainder of the review of systems is unremarkable.  Blood pressure 128/63, pulse 80, temperature 98.1 F (36.7 C), temperature source Oral, resp. rate 18, height _0  (1.651 m), weight 159 lb 12.8 oz (72.485 kg), SpO2 97 %. PE: General:Pleasant affect, NAD Skin:Warm and dry, brisk capillary refill HEENT:normocephalic, sclera clear, mucus membranes moist Neck:supple, mild  JVD, no bruits  Heart:S1S2 RRR without murmur, gallup, rub or click Lungs:clear without rales, rhonchi, or wheezes MAU:QJFH, non tender, + BS, do not palpate liver spleen or masses Ext:no lower ext edema, 2+ pedal pulses, 2+ radial pulses Neuro:alert and oriented X 3, MAE, follows commands, + facial symmetry   Assessment/Plan  1.  Isolated episode of chest pain with minimal elevated troponin but no EKG changes 2.  Type 2 diabetes mellitus with renal disease 3.  Stage 3-4 chronic kidney disease 4.  Hyperlipidemia under treatment 5.  Chronic venous insufficiency  RECOMMENDATIONS:  Continue intravenous heparin for now.  Cycle troponins.  If troponins are flat consider myocardial perfusion scan yesterday.  He never returned for echo in December so obtain echocardiogram.  W. Doristine Church. MD [facility] 09/19/2014 11:45 AM

## 2014-09-19 NOTE — Progress Notes (Signed)
ANTICOAGULATION CONSULT NOTE - [place] for heparin  Indication: chest pain/ACS, NSTEMI  Allergies  Allergen Reactions  . Penicillins Hives and Swelling  . Macrodantin [Nitrofurantoin Macrocrystal] Other (See Comments)    Chest pains  . Other     "lubriderm pain pill?"  . Tramadol Other (See Comments)    Blank stare, no expression, becoming addictive  . Unisom [Doxylamine Succinate (Sleep)] Other (See Comments)    Hangover the next day    Patient Measurements: Height: 5\' 5"  (165.1 cm) Weight: 159 lb 12.8 oz (72.485 kg) IBW/kg (Calculated) : 61.5 Heparin Dosing Weight: 72.6 kg   Vital Signs: Temp: 98 F (36.7 C) (08/06 1317) Temp Source: Oral (08/06 1317) BP: 108/51 mmHg (08/06 1317) Pulse Rate: 68 (08/06 1317)  Labs:  Recent Labs  09/18/14 1811 09/18/14 1915 09/19/14 0914 09/19/14 1047 09/19/14 1525 09/19/14 1830  HGB 15.3  --   --   --   --   --   HCT 46.7  --   --   --   --   --   PLT 143*  --   --   --   --   --   APTT  --  32  --   --   --   --   LABPROT  --  13.8 16.6*  --   --   --   INR  --  1.04 1.33  --   --   --   HEPARINUNFRC  --   --  0.24*  --   --  1.20*  CREATININE 1.63*  --   --  1.70*  --   --   TROPONINI 0.11*  --   --  0.11* 0.11*  --     Estimated Creatinine Clearance: 26.6 mL/min (by C-G formula based on Cr of 1.7).  Assessment: 81 yo male with central chest pain and pressure. Started on heparin gtt for NSTEMI. Level was low this morning, however this afternoon heparin level is 1.1units/mL. Peripheral stick, so it was not drawn off line heparin running through. No bleeding noted.   Goal of Therapy:  Heparin level 0.3-0.7 units/ml Monitor platelets by anticoagulation protocol: Yes   Plan:  -hold heparin x1h, then restart at 2200 at 950 units/hr- spoke with RN on the phone about this plan -HL 8h after restart at 0600 -daily HL and CBC -follow for s/s bleeding  Ilyanna Baillargeon D. Horris Speros, PharmD, BCPS Clinical  Pharmacist Pager: [phone] 09/19/2014 8:55 PM

## 2014-09-19 NOTE — Progress Notes (Signed)
ANTICOAGULATION CONSULT NOTE - [place] for heparin  Indication: chest pain/ACS, NSTEMI  Allergies  Allergen Reactions  . Penicillins Hives and Swelling  . Other     "lubriderm pain pill?"  . Tramadol     Patient Measurements: Height: 5\' 5"  (165.1 cm) Weight: 159 lb 12.8 oz (72.485 kg) IBW/kg (Calculated) : 61.5 Heparin Dosing Weight: 72.6 kg   Vital Signs: Temp: 98.1 F (36.7 C) (08/06 0500) Temp Source: Oral (08/06 0500) BP: 137/66 mmHg (08/06 1111) Pulse Rate: 68 (08/06 1111)  Labs:  Recent Labs  09/18/14 1811 09/18/14 1915 09/19/14 0914 09/19/14 1047  HGB 15.3  --   --   --   HCT 46.7  --   --   --   PLT 143*  --   --   --   APTT  --  32  --   --   LABPROT  --  13.8 16.6*  --   INR  --  1.04 1.33  --   HEPARINUNFRC  --   --  0.24*  --   CREATININE 1.63*  --   --  1.70*  TROPONINI 0.11*  --   --  0.11*    Estimated Creatinine Clearance: 26.6 mL/min (by C-G formula based on Cr of 1.7).   Medical History: Past Medical History  Diagnosis Date  . Polycythemia vera(238.4) 02/20/2011  . DM (diabetes mellitus), type 2   . HTN (hypertension)   . OA (osteoarthritis)   . Hyperlipidemia   . Esophagitis   . GERD (gastroesophageal reflux disease)   . Hiatal hernia   . Migraine   . Vertigo   . Thrombocytopenia   . Myeloproliferative disease   . Spinal stenosis, lumbar     Medications:  Prescriptions prior to admission  Medication Sig Dispense Refill Last Dose  . ALPRAZolam (XANAX) 0.5 MG tablet Take 0.5 mg by mouth daily.   Taking  . Ascorbic Acid (VITAMIN C) 1000 MG tablet Take 1,000 mg by mouth daily.   Taking  . aspirin 325 MG EC tablet Take 325 mg by mouth daily.   Taking  . calcium carbonate (OS-CAL) 600 MG TABS Take 600 mg by mouth 2 (two) times daily with a meal.   Taking  . doxylamine, Sleep, (UNISOM) 25 MG tablet Take 25 mg by mouth at bedtime as needed.   Taking  . dutasteride (AVODART) 0.5 MG capsule Take 0.5 mg by  mouth daily.   Taking  . esomeprazole (NEXIUM) 40 MG capsule Take 40 mg by mouth daily before breakfast.   Taking  . fish oil-omega-3 fatty acids 1000 MG capsule Take 600 mg by mouth 2 (two) times daily.   Taking  . glipiZIDE (GLUCOTROL XL) 5 MG 24 hr tablet Take 5 mg by mouth daily.   Taking  . glucosamine-chondroitin 500-400 MG tablet Take 1 tablet by mouth as needed.   Taking  . hydrochlorothiazide (HYDRODIURIL) 12.5 MG tablet Take 12.5 mg by mouth daily.   Taking  . imiquimod (ALDARA) 5 % cream Apply topically as needed.    Taking  . lisinopril (PRINIVIL,ZESTRIL) 5 MG tablet Take 5 mg by mouth daily.   Taking  . metoprolol (LOPRESSOR) 50 MG tablet Take 25 mg by mouth 2 (two) times daily.   Taking  . Multiple Vitamins-Minerals (MULTIVITAMIN WITH MINERALS) tablet Take 1 tablet by mouth daily.   Taking  . pravastatin (PRAVACHOL) 20 MG tablet Take 20 mg by mouth daily.   Taking  .  tamsulosin (FLOMAX) 0.4 MG CAPS capsule Take by mouth.   Taking  . traMADol (ULTRAM) 50 MG tablet Take 50 mg by mouth 2 (two) times daily.    Taking  . vitamin E (VITAMIN E) 400 UNIT capsule Take 400 Units by mouth daily.   Taking    Assessment: 81 yo male with central chest pain and pressure. Started on heparin gtt for NSTEMI. Heparin 4000 unit bolus given, gtt started at 850 u/hr.   6hr HL 0.24, Hgb 15.3, Plt 143. On ASA    Goal of Therapy:  Heparin level 0.3-0.7 units/ml Monitor platelets by anticoagulation protocol: Yes   Plan:  -Heparin bolus 1500 units x1, then increase to 1000 units/hr -Repeat HL at 1800 -Daily HL, CBC -Monitor s/sx bleeding  Governor Specking, PharmD Clinical Pharmacy Resident Pager: [phone]  09/19/2014 12:23 PM

## 2014-09-20 ENCOUNTER — Inpatient Hospital Stay (HOSPITAL_COMMUNITY): Payer: Medicare Other

## 2014-09-20 DIAGNOSIS — R7989 Other specified abnormal findings of blood chemistry: Secondary | ICD-10-CM

## 2014-09-20 LAB — HEPARIN LEVEL (UNFRACTIONATED)
HEPARIN UNFRACTIONATED: 0.3 [IU]/mL (ref 0.30–0.70)
Heparin Unfractionated: 0.72 IU/mL — ABNORMAL HIGH (ref 0.30–0.70)

## 2014-09-20 LAB — CBC
HCT: 43.2 % (ref 39.0–52.0)
Hemoglobin: 14.4 g/dL (ref 13.0–17.0)
MCH: 26.9 pg (ref 26.0–34.0)
MCHC: 33.3 g/dL (ref 30.0–36.0)
MCV: 80.7 fL (ref 78.0–100.0)
Platelets: 130 10*3/uL — ABNORMAL LOW (ref 150–400)
RBC: 5.35 MIL/uL (ref 4.22–5.81)
RDW: 15.8 % — ABNORMAL HIGH (ref 11.5–15.5)
WBC: 5.7 10*3/uL (ref 4.0–10.5)

## 2014-09-20 LAB — BASIC METABOLIC PANEL
Anion gap: 7 (ref 5–15)
BUN: 33 mg/dL — ABNORMAL HIGH (ref 6–20)
CALCIUM: 9.2 mg/dL (ref 8.9–10.3)
CO2: 22 mmol/L (ref 22–32)
Chloride: 106 mmol/L (ref 101–111)
Creatinine, Ser: 1.57 mg/dL — ABNORMAL HIGH (ref 0.61–1.24)
GFR calc Af Amer: 44 mL/min — ABNORMAL LOW (ref >60)
GFR, EST NON AFRICAN AMERICAN: 38 mL/min — AB (ref >60)
Glucose, Bld: 169 mg/dL — ABNORMAL HIGH (ref 65–99)
Potassium: 4.2 mmol/L (ref 3.5–5.1)
SODIUM: 135 mmol/L (ref 135–145)

## 2014-09-20 LAB — GLUCOSE, CAPILLARY
Glucose-Capillary: 156 mg/dL — ABNORMAL HIGH (ref 65–99)
Glucose-Capillary: 184 mg/dL — ABNORMAL HIGH (ref 65–99)
Glucose-Capillary: 123 mg/dL — ABNORMAL HIGH (ref 65–99)

## 2014-09-20 MED ORDER — CLOPIDOGREL BISULFATE 75 MG PO TABS
75.0000 mg | ORAL_TABLET | Freq: Every day | ORAL | Status: DC
  Administered 2014-09-20 – 2014-09-21 (×2): 75 mg via ORAL
  Filled 2014-09-20 (×2): qty 1

## 2014-09-20 MED ORDER — REGADENOSON 0.4 MG/5ML IV SOLN
INTRAVENOUS | Status: AC
  Administered 2014-09-20: 0.4 mg via INTRAVENOUS
  Filled 2014-09-20: qty 5

## 2014-09-20 MED ORDER — TECHNETIUM TC 99M SESTAMIBI GENERIC - CARDIOLITE
30.9000 | Freq: Once | INTRAVENOUS | Status: AC | PRN
  Administered 2014-09-20: 30.9 via INTRAVENOUS

## 2014-09-20 MED ORDER — REGADENOSON 0.4 MG/5ML IV SOLN
0.4000 mg | Freq: Once | INTRAVENOUS | Status: AC
  Administered 2014-09-20: 0.4 mg via INTRAVENOUS
  Filled 2014-09-20: qty 5

## 2014-09-20 MED ORDER — ISOSORBIDE MONONITRATE ER 60 MG PO TB24
60.0000 mg | ORAL_TABLET | Freq: Every day | ORAL | Status: DC
  Administered 2014-09-20 – 2014-09-21 (×2): 60 mg via ORAL
  Filled 2014-09-20 (×2): qty 1

## 2014-09-20 MED ORDER — TECHNETIUM TC 99M SESTAMIBI GENERIC - CARDIOLITE
10.0000 | Freq: Once | INTRAVENOUS | Status: AC | PRN
  Administered 2014-09-20: 10 via INTRAVENOUS

## 2014-09-20 NOTE — Progress Notes (Signed)
Subjective: No further chest pain  Objective: Vital signs in last 24 hours: Temp:  [97.9 F (36.6 C)-98.2 F (36.8 C)] 98.2 F (36.8 C) (08/07 0500) Pulse Rate:  [68-79] 68 (08/07 0500) Resp:  [15-20] 15 (08/07 0500) BP: (103-154)/(51-76) 154/76 mmHg (08/07 0915) SpO2:  [95 %-98 %] 96 % (08/07 0500) Weight:  [157 lb 9.6 oz (71.487 kg)] 157 lb 9.6 oz (71.487 kg) (08/07 0500) Weight change: -2 lb 6.4 oz (-1.089 kg) Last BM Date: 09/18/14 Intake/Output from previous day: 08/06 0701 - 08/07 0700 In: 700 [P.O.:700] Out: 750 [Urine:750] Intake/Output this shift: Total I/O In: -  Out: 100 [Urine:100]  PE: General:Pleasant affect, NAD Skin:Warm and dry, brisk capillary refill HEENT:normocephalic, sclera clear, mucus membranes moist Heart:S1S2 RRR without murmur, gallup, rub or click Lungs:clear without rales, rhonchi, or wheezes DSK:AJGO, non tender, + BS, do not palpate liver spleen or masses Ext:no lower ext edema, 2+ pedal pulses, 2+ radial pulses Neuro:alert and oriented, MAE, follows commands, + facial symmetry   Lab Results:  Recent Labs  09/18/14 1811 09/20/14 0620  WBC 8.0 5.7  HGB 15.3 14.4  HCT 46.7 43.2  PLT 143* 130*   BMET  Recent Labs  09/19/14 1047 09/20/14 0620  NA 140 135  K 4.0 4.2  CL 104 106  CO2 27 22  GLUCOSE 144* 169*  BUN 30* 33*  CREATININE 1.70* 1.57*  CALCIUM 9.5 9.2    Recent Labs  09/19/14 1525 09/19/14 2105  TROPONINI 0.11* 0.14*    Lab Results  Component Value Date   CHOL 182 09/19/2014   HDL 47 09/19/2014   LDLCALC 112* 09/19/2014   TRIG 113 09/19/2014   CHOLHDL 3.9 09/19/2014   No results found for: HGBA1C   Lab Results  Component Value Date   TSH 0.619 09/19/2014    Hepatic Function Panel  Recent Labs  09/19/14 1047  PROT 5.8*  ALBUMIN 3.2*  AST 20  ALT 14*  ALKPHOS 45  BILITOT 1.0    Recent Labs  09/19/14 1049  CHOL 182   No results for input(s): PROTIME in the last 72  hours.     Studies/Results: Dg Chest 2 View  09/18/2014   CLINICAL DATA:  Mid chest pain for 2 days.  EXAM: CHEST  2 VIEW  COMPARISON:  None.  FINDINGS: Cardiac silhouette is borderline enlarged. No mediastinal or hilar masses or evidence of adenopathy.  No lung consolidation or edema. No pleural effusion pneumothorax. Mild linear scarring versus atelectasis projects in the left lung base.  Bony thorax is demineralized. There are degenerative changes of the thoracic spine and arthropathic changes of the right glenohumeral joint.  IMPRESSION: No acute cardiopulmonary disease.   Electronically Signed   By: Lajean Manes M.D.   On: 09/18/2014 19:23    Medications: I have reviewed the patient's current medications. Scheduled Meds: . ALPRAZolam  0.5 mg Oral Daily  . aspirin EC  81 mg Oral Daily  . calcium carbonate  1 tablet Oral BID WC  . dutasteride  0.5 mg Oral Daily  . glipiZIDE  5 mg Oral Q breakfast  . hydrochlorothiazide  12.5 mg Oral Daily  . insulin aspart  0-5 Units Subcutaneous QHS  . insulin aspart  0-9 Units Subcutaneous TID WC  . metoprolol  25 mg Oral BID  . multivitamin with minerals  1 tablet Oral Daily  . nitroGLYCERIN  0.5 inch Topical 3 times per day  . omega-3 acid  ethyl esters  1 g Oral Daily  . pantoprazole  40 mg Oral Daily  . pravastatin  20 mg Oral q1800  . regadenoson      . regadenoson  0.4 mg Intravenous Once  . tamsulosin  0.4 mg Oral QPC supper  . traMADol  50 mg Oral BID  . vitamin C  1,000 mg Oral Daily   Continuous Infusions: . sodium chloride 10 mL/hr at 09/19/14 2211  . heparin 850 Units/hr (09/20/14 0753)   PRN Meds:.acetaminophen, nitroGLYCERIN, ondansetron (ZOFRAN) IV  Assessment/Plan:  1. Isolated episode of chest pain with minimal elevated troponin but no EKG changes 2. Type 2 diabetes mellitus with renal disease 3. Stage 3-4 chronic kidney disease 4. Hyperlipidemia under treatment 5. Chronic venous insufficiency  lexiscan myoview  stress portion completed without complications.    The Lexiscan test was intermediate probability with an area of inferior ischemia.  Echo shows moderate LVH with possible mild inferior hypokinesis.  The patient has had no recurrent episodes of chest pain.  Discussed invasive evaluation versus intensive medical therapy.  At his age he would prefer to try medications first.  He was able to swim several days a week previously without symptoms.  Since he is only intermediate probability we will place him on Plavix and beta blocker and have him walk.  If he has no recurrent symptoms possibly discharge on medical therapy and then if he has recurrent symptoms could reserve interventional evaluation if needed.  Kerry Hough. MD [facility] 09/20/2014 3:36 PM

## 2014-09-20 NOTE — Progress Notes (Signed)
ANTICOAGULATION CONSULT NOTE - Follow Up Consult  Pharmacy Consult for Heparin  Indication: chest pain/ACS  Allergies  Allergen Reactions  . Penicillins Hives and Swelling  . Macrodantin [Nitrofurantoin Macrocrystal] Other (See Comments)    Chest pains  . Other     "lubriderm pain pill?"  . Tramadol Other (See Comments)    Blank stare, no expression, becoming addictive  . Unisom [Doxylamine Succinate (Sleep)] Other (See Comments)    Hangover the next day   Patient Measurements: Height: 5\' 5"  (165.1 cm) Weight: 157 lb 9.6 oz (71.487 kg) IBW/kg (Calculated) : 61.5 Vital Signs: Temp: 98.2 F (36.8 C) (08/07 0500) BP: 103/66 mmHg (08/07 0500) Pulse Rate: 68 (08/07 0500)  Labs:  Recent Labs  09/18/14 1811 09/18/14 1915 09/19/14 0914 09/19/14 1047 09/19/14 1525 09/19/14 1830 09/19/14 2105 09/20/14 0620  HGB 15.3  --   --   --   --   --   --  14.4  HCT 46.7  --   --   --   --   --   --  43.2  PLT 143*  --   --   --   --   --   --  130*  APTT  --  32  --   --   --   --   --   --   LABPROT  --  13.8 16.6*  --   --   --   --   --   INR  --  1.04 1.33  --   --   --   --   --   HEPARINUNFRC  --   --  0.24*  --   --  1.20*  --  0.72*  CREATININE 1.63*  --   --  1.70*  --   --   --  1.57*  TROPONINI 0.11*  --   --  0.11* 0.11*  --  0.14*  --     Estimated Creatinine Clearance: 28.8 mL/min (by C-G formula based on Cr of 1.57).  Assessment: Slightly supra-therapeutic heparin level, no issues per RN.   Goal of Therapy:  Heparin level 0.3-0.7 units/ml Monitor platelets by anticoagulation protocol: Yes   Plan:  -Reduce heparin to 850 units/hr -1500 HL -Daily CBC/HL -Monitor for bleeding  Narda Bonds 09/20/2014,7:10 AM

## 2014-09-21 DIAGNOSIS — R9430 Abnormal result of cardiovascular function study, unspecified: Secondary | ICD-10-CM

## 2014-09-21 DIAGNOSIS — N183 Chronic kidney disease, stage 3 unspecified: Secondary | ICD-10-CM

## 2014-09-21 DIAGNOSIS — E785 Hyperlipidemia, unspecified: Secondary | ICD-10-CM | POA: Insufficient documentation

## 2014-09-21 DIAGNOSIS — N400 Enlarged prostate without lower urinary tract symptoms: Secondary | ICD-10-CM | POA: Insufficient documentation

## 2014-09-21 DIAGNOSIS — I872 Venous insufficiency (chronic) (peripheral): Secondary | ICD-10-CM | POA: Insufficient documentation

## 2014-09-21 HISTORY — DX: Benign prostatic hyperplasia without lower urinary tract symptoms: N40.0

## 2014-09-21 HISTORY — DX: Chronic kidney disease, stage 3 unspecified: N18.30

## 2014-09-21 LAB — CBC
HCT: 40 % (ref 39.0–52.0)
HEMOGLOBIN: 12.9 g/dL — AB (ref 13.0–17.0)
MCH: 25.9 pg — ABNORMAL LOW (ref 26.0–34.0)
MCHC: 32.3 g/dL (ref 30.0–36.0)
MCV: 80.3 fL (ref 78.0–100.0)
Platelets: 124 10*3/uL — ABNORMAL LOW (ref 150–400)
RBC: 4.98 MIL/uL (ref 4.22–5.81)
RDW: 15.8 % — ABNORMAL HIGH (ref 11.5–15.5)
WBC: 5.8 10*3/uL (ref 4.0–10.5)

## 2014-09-21 LAB — HEMOGLOBIN A1C
HEMOGLOBIN A1C: 7.4 % — AB (ref 4.8–5.6)
MEAN PLASMA GLUCOSE: 166 mg/dL

## 2014-09-21 LAB — GLUCOSE, CAPILLARY
GLUCOSE-CAPILLARY: 118 mg/dL — AB (ref 65–99)
Glucose-Capillary: 138 mg/dL — ABNORMAL HIGH (ref 65–99)

## 2014-09-21 MED ORDER — ASPIRIN EC 81 MG PO TBEC: 81.0000 mg | DELAYED_RELEASE_TABLET | Freq: Every day | ORAL | Status: DC

## 2014-09-21 MED ORDER — ISOSORBIDE MONONITRATE ER 60 MG PO TB24: 60.0000 mg | ORAL_TABLET | Freq: Every day | ORAL | Status: DC

## 2014-09-21 MED ORDER — NITROGLYCERIN 0.4 MG SL SUBL: 0.4000 mg | SUBLINGUAL_TABLET | SUBLINGUAL | Status: AC | PRN

## 2014-09-21 MED ORDER — CLOPIDOGREL BISULFATE 75 MG PO TABS: 75.0000 mg | ORAL_TABLET | Freq: Every day | ORAL | Status: DC

## 2014-09-21 NOTE — Discharge Summary (Signed)
Physician Discharge Summary  Patient ID: GRANVEL PROUDFOOT MRN: [id number] DOB/AGE: 08/09/1924 81 y.o.  Admit date: 09/18/2014 Discharge date: 09/21/2014  Primary Physician: Dr. Melinda Crutch   Primary Discharge Diagnosis:  1. Non STEMI  Secondary Discharge Diagnosis: 2. Type 2 diabetes non-insulin-dependent with chronic kidney disease 3. Stage III chronic kidney disease 4. History of polycythemia 5. BPH 6. Hypertensive heart disease with LVH 7. Myeloproliferative disease 8. Hyperlipidemia  Procedures:  2-D echocardiogram, myocardial perfusion scan  Hospital Course: This 81 year old male who has a history of diabetes hypertension hyperlipidemia and myeloproliferative disease. He has stage III for chronic kidney disease. He was seen recently by the urologist and started on antibiotics for urinary tract infection and also was placed on a medicine to help him void but after 3 days stopped taking it as he did not feel well. The day prior to admission he developed chest pain described like a knot in his chest as well as indigestion tried belching without relief. It is soft but he talked later to urology and was advised to go to the emergency room. He was taken to the [facility] emergency room and was not having chest pain but had a very minimally elevated troponin and was transferred to [facility]. He was evaluated by me in December for edema but never had a followup echocardiogram. He is normally active and able to swim and do other activities normally does not have chest discomfort.  Hospital course: He was placed initially on heparin and had no recurrent chest pain. Troponin is mildly elevated and flat and an EKG was unremarkable. His echocardiogram showed an EF of around 50-55% with questionable mild inferior hypokinesis. He had moderate LVH. He had a myocardial perfusion scan done on 8/7 with findings of a partially reversible inferior defect and an EF of 44%. Echo EF was somewhat higher. He had  had no recurrent chest discomfort and options of interventional evaluation versus continued medical therapy were discussed with the patient. He is at somewhat increased risk of a cath because of his diabetes and renal disease and he felt like he would rather try intensive medical therapy first and reserve He has recurrent symptoms. He was felt to be an intermediate and not high risk.  He was initiated on treatment with isosorbide mononitrate as well as Plavix and was seen by cardiac rehabilitation and was ambulatory in the hall. His heparin was discontinued and he was discharged home in improved condition instructions to call if he develops recurrent chest discomfort. He will be seen in followup in one week. He was advised to become involved in cardiac rehabilitation.  Discharge Exam: Blood pressure 94/52, pulse 59, temperature 97.8 F (36.6 C), temperature source Oral, resp. rate 18, height 5\' 5"  (1.651 m), weight 70.897 kg (156 lb 4.8 oz), SpO2 98 %. Weight: 70.897 kg (156 lb 4.8 oz) Lungs clear, no S3  Labs: CBC:   Lab Results  Component Value Date   WBC 5.8 09/21/2014   HGB 12.9* 09/21/2014   HCT 40.0 09/21/2014   MCV 80.3 09/21/2014   PLT 124* 09/21/2014   CMP:  Recent Labs Lab 09/19/14 1047 09/20/14 0620  NA 140 135  K 4.0 4.2  CL 104 106  CO2 27 22  BUN 30* 33*  CREATININE 1.70* 1.57*  CALCIUM 9.5 9.2  PROT 5.8*  --   BILITOT 1.0  --   ALKPHOS 45  --   ALT 14*  --   AST 20  --  GLUCOSE 144* 169*   Lipid Panel     Component Value Date/Time   CHOL 182 09/19/2014 1049   TRIG 113 09/19/2014 1049   HDL 47 09/19/2014 1049   CHOLHDL 3.9 09/19/2014 1049   VLDL 23 09/19/2014 1049   LDLCALC 112* 09/19/2014 1049   Cardiac Enzymes:  Recent Labs  09/19/14 1047 09/19/14 1525 09/19/14 2105  TROPONINI 0.11* 0.11* 0.14*   Thyroid: Lab Results  Component Value Date   TSH 0.619 09/19/2014    Radiology: Borderline cardiomegaly, no acute disease  EKG: Sinus  bradycardia with first degree AV block.  Discharge Medications:   Medication List    STOP taking these medications        hydrochlorothiazide 12.5 MG capsule  Commonly known as:  MICROZIDE      TAKE these medications        ALPRAZolam 0.5 MG tablet  Commonly known as:  XANAX  Take 0.5 mg by mouth daily.     aspirin EC 81 MG tablet  Take 1 tablet (81 mg total) by mouth daily.     clopidogrel 75 MG tablet  Commonly known as:  PLAVIX  Take 1 tablet (75 mg total) by mouth daily.     dutasteride 0.5 MG capsule  Commonly known as:  AVODART  Take 0.5 mg by mouth daily.     esomeprazole 40 MG capsule  Commonly known as:  NEXIUM  Take 40 mg by mouth daily as needed (for acid reflux or heartburn).     finasteride 5 MG tablet  Commonly known as:  PROSCAR  Take 5 mg by mouth at bedtime.     glipiZIDE 5 MG 24 hr tablet  Commonly known as:  GLUCOTROL XL  Take 5 mg by mouth daily.     glucosamine-chondroitin 500-400 MG tablet  Take 1 tablet by mouth daily as needed (joint supplementation).     isosorbide mononitrate 60 MG 24 hr tablet  Commonly known as:  IMDUR  Take 1 tablet (60 mg total) by mouth daily.     lisinopril 5 MG tablet  Commonly known as:  PRINIVIL,ZESTRIL  Take 5 mg by mouth at bedtime.     metoprolol 50 MG tablet  Commonly known as:  LOPRESSOR  Take 25 mg by mouth 2 (two) times daily.     nitroGLYCERIN 0.4 MG SL tablet  Commonly known as:  NITROSTAT  Place 1 tablet (0.4 mg total) under the tongue every 5 (five) minutes x 3 doses as needed for chest pain.     pravastatin 40 MG tablet  Commonly known as:  PRAVACHOL  Take 40 mg by mouth at bedtime.     tamsulosin 0.4 MG Caps capsule  Commonly known as:  FLOMAX  Take 0.4 mg by mouth 2 (two) times daily.     VESICARE 10 MG tablet  Generic drug:  solifenacin  Take 10 mg by mouth daily.     vitamin C 1000 MG tablet  Take 1,000 mg by mouth daily as needed (immune system support).     vitamin E 400  UNIT capsule  Generic drug:  vitamin E  Take 400 Units by mouth daily as needed (for supplementation).       Followup plans and appointments:  Followup with Dr. Wynonia Lawman in one week .  Time spent with patient to include physician time:    Signed: W. Doristine Church. MD [facility] 09/21/2014, 11:09 AM

## 2014-09-21 NOTE — Progress Notes (Signed)
.  CARDIAC REHAB PHASE I   PRE:  Rate/Rhythm: 65 SR  BP:  Supine: 110/80  Sitting:   Standing:    SaO2: 98%RA  MODE:  Ambulation: 300 ft   POST:  Rate/Rhythm: 94 SR up walking  70 at rest  BP:  Supine:   Sitting: 122/60  Standing:    SaO2: 97%RA 0915-1020 Pt walked 300 ft with steady gait. Took cane with him to use when right knee starts bothering him. Pt likes to swim due to less impact on knees. Told him to do what walking he can with his knees and talk to Dr Wynonia Lawman at follow up as to when he can return swimming. Gave heart healthy and diabetic diets and discussed healthy food choices. Discussed importance of plavix and how to use NTG tablets if needed. Discussed CRP 2 and gave brochure. Pt agreed to referral but would like to discuss with Dr Wynonia Lawman.  Will have outpt follow up with pt after he has office visit.    Graylon Good, RN BSN  09/21/2014 10:16 AM   65 SR

## 2014-09-21 NOTE — Care Management Important Message (Signed)
Important Message  Patient Details  Name: Aaron Stephenson MRN: [id number] Date of Birth: 11/09/1924   Medicare Important Message Given:  Yes-second notification given    Pricilla Handler 09/21/2014, 2:59 PM

## 2014-09-28 DIAGNOSIS — D45 Polycythemia vera: Secondary | ICD-10-CM | POA: Diagnosis not present

## 2014-09-28 DIAGNOSIS — I25119 Atherosclerotic heart disease of native coronary artery with unspecified angina pectoris: Secondary | ICD-10-CM | POA: Diagnosis not present

## 2014-09-28 DIAGNOSIS — K219 Gastro-esophageal reflux disease without esophagitis: Secondary | ICD-10-CM | POA: Diagnosis not present

## 2014-09-28 DIAGNOSIS — E668 Other obesity: Secondary | ICD-10-CM | POA: Diagnosis not present

## 2014-09-28 DIAGNOSIS — I119 Hypertensive heart disease without heart failure: Secondary | ICD-10-CM | POA: Diagnosis not present

## 2014-09-28 DIAGNOSIS — E784 Other hyperlipidemia: Secondary | ICD-10-CM | POA: Diagnosis not present

## 2014-09-28 DIAGNOSIS — E1121 Type 2 diabetes mellitus with diabetic nephropathy: Secondary | ICD-10-CM | POA: Diagnosis not present

## 2014-09-28 DIAGNOSIS — N183 Chronic kidney disease, stage 3 (moderate): Secondary | ICD-10-CM | POA: Diagnosis not present

## 2014-09-28 DIAGNOSIS — I10 Essential (primary) hypertension: Secondary | ICD-10-CM | POA: Diagnosis not present

## 2014-09-28 DIAGNOSIS — R609 Edema, unspecified: Secondary | ICD-10-CM | POA: Diagnosis not present

## 2014-10-12 DIAGNOSIS — Z23 Encounter for immunization: Secondary | ICD-10-CM | POA: Diagnosis not present

## 2014-10-22 DIAGNOSIS — D45 Polycythemia vera: Secondary | ICD-10-CM | POA: Diagnosis not present

## 2014-10-22 DIAGNOSIS — E785 Hyperlipidemia, unspecified: Secondary | ICD-10-CM | POA: Diagnosis not present

## 2014-10-22 DIAGNOSIS — N400 Enlarged prostate without lower urinary tract symptoms: Secondary | ICD-10-CM | POA: Diagnosis not present

## 2014-10-22 DIAGNOSIS — I252 Old myocardial infarction: Secondary | ICD-10-CM | POA: Diagnosis not present

## 2014-10-22 DIAGNOSIS — E1122 Type 2 diabetes mellitus with diabetic chronic kidney disease: Secondary | ICD-10-CM | POA: Diagnosis not present

## 2014-10-22 DIAGNOSIS — Z79899 Other long term (current) drug therapy: Secondary | ICD-10-CM | POA: Diagnosis not present

## 2014-10-22 DIAGNOSIS — N183 Chronic kidney disease, stage 3 (moderate): Secondary | ICD-10-CM | POA: Diagnosis not present

## 2014-10-22 DIAGNOSIS — I10 Essential (primary) hypertension: Secondary | ICD-10-CM | POA: Diagnosis not present

## 2014-10-26 DIAGNOSIS — M545 Low back pain: Secondary | ICD-10-CM | POA: Diagnosis not present

## 2014-10-26 DIAGNOSIS — M5136 Other intervertebral disc degeneration, lumbar region: Secondary | ICD-10-CM | POA: Diagnosis not present

## 2014-11-05 DIAGNOSIS — M545 Low back pain: Secondary | ICD-10-CM | POA: Diagnosis not present

## 2014-11-05 DIAGNOSIS — M4726 Other spondylosis with radiculopathy, lumbar region: Secondary | ICD-10-CM | POA: Diagnosis not present

## 2014-11-05 DIAGNOSIS — I872 Venous insufficiency (chronic) (peripheral): Secondary | ICD-10-CM | POA: Diagnosis not present

## 2014-11-09 ENCOUNTER — Other Ambulatory Visit: Payer: Self-pay | Admitting: Sports Medicine

## 2014-11-09 DIAGNOSIS — M5442 Lumbago with sciatica, left side: Secondary | ICD-10-CM | POA: Diagnosis not present

## 2014-11-09 DIAGNOSIS — K219 Gastro-esophageal reflux disease without esophagitis: Secondary | ICD-10-CM | POA: Diagnosis not present

## 2014-11-09 DIAGNOSIS — M5136 Other intervertebral disc degeneration, lumbar region: Secondary | ICD-10-CM | POA: Diagnosis not present

## 2014-11-09 DIAGNOSIS — I214 Non-ST elevation (NSTEMI) myocardial infarction: Secondary | ICD-10-CM | POA: Diagnosis not present

## 2014-11-09 DIAGNOSIS — E1121 Type 2 diabetes mellitus with diabetic nephropathy: Secondary | ICD-10-CM | POA: Diagnosis not present

## 2014-11-09 DIAGNOSIS — I119 Hypertensive heart disease without heart failure: Secondary | ICD-10-CM | POA: Diagnosis not present

## 2014-11-09 DIAGNOSIS — G8929 Other chronic pain: Secondary | ICD-10-CM | POA: Diagnosis not present

## 2014-11-09 DIAGNOSIS — E784 Other hyperlipidemia: Secondary | ICD-10-CM | POA: Diagnosis not present

## 2014-11-09 DIAGNOSIS — N183 Chronic kidney disease, stage 3 (moderate): Secondary | ICD-10-CM | POA: Diagnosis not present

## 2014-11-09 DIAGNOSIS — D45 Polycythemia vera: Secondary | ICD-10-CM | POA: Diagnosis not present

## 2014-11-09 DIAGNOSIS — I25119 Atherosclerotic heart disease of native coronary artery with unspecified angina pectoris: Secondary | ICD-10-CM | POA: Diagnosis not present

## 2014-11-09 DIAGNOSIS — M545 Low back pain: Secondary | ICD-10-CM

## 2014-11-09 DIAGNOSIS — E668 Other obesity: Secondary | ICD-10-CM | POA: Diagnosis not present

## 2014-11-14 ENCOUNTER — Ambulatory Visit
Admission: RE | Admit: 2014-11-14 | Discharge: 2014-11-14 | Disposition: A | Discharge status: Home / Self Care | Payer: Medicare Other | Source: Ambulatory Visit | Attending: Sports Medicine | Admitting: Sports Medicine

## 2014-11-14 DIAGNOSIS — M47816 Spondylosis without myelopathy or radiculopathy, lumbar region: Secondary | ICD-10-CM | POA: Diagnosis not present

## 2014-11-14 DIAGNOSIS — M545 Low back pain: Secondary | ICD-10-CM

## 2014-12-02 DIAGNOSIS — M4726 Other spondylosis with radiculopathy, lumbar region: Secondary | ICD-10-CM | POA: Diagnosis not present

## 2014-12-02 DIAGNOSIS — I872 Venous insufficiency (chronic) (peripheral): Secondary | ICD-10-CM | POA: Diagnosis not present

## 2014-12-02 DIAGNOSIS — M545 Low back pain: Secondary | ICD-10-CM | POA: Diagnosis not present

## 2014-12-17 DIAGNOSIS — M1712 Unilateral primary osteoarthritis, left knee: Secondary | ICD-10-CM | POA: Diagnosis not present

## 2014-12-17 DIAGNOSIS — M1711 Unilateral primary osteoarthritis, right knee: Secondary | ICD-10-CM | POA: Diagnosis not present

## 2015-01-14 DIAGNOSIS — M1711 Unilateral primary osteoarthritis, right knee: Secondary | ICD-10-CM | POA: Diagnosis not present

## 2015-01-14 DIAGNOSIS — M25462 Effusion, left knee: Secondary | ICD-10-CM | POA: Diagnosis not present

## 2015-01-14 DIAGNOSIS — M1712 Unilateral primary osteoarthritis, left knee: Secondary | ICD-10-CM | POA: Diagnosis not present

## 2015-01-14 DIAGNOSIS — M25461 Effusion, right knee: Secondary | ICD-10-CM | POA: Diagnosis not present

## 2015-01-29 DIAGNOSIS — R351 Nocturia: Secondary | ICD-10-CM | POA: Diagnosis not present

## 2015-01-29 DIAGNOSIS — R3916 Straining to void: Secondary | ICD-10-CM | POA: Diagnosis not present

## 2015-01-29 DIAGNOSIS — C61 Malignant neoplasm of prostate: Secondary | ICD-10-CM | POA: Diagnosis not present

## 2015-01-29 DIAGNOSIS — N401 Enlarged prostate with lower urinary tract symptoms: Secondary | ICD-10-CM | POA: Diagnosis not present

## 2015-02-03 DIAGNOSIS — M25461 Effusion, right knee: Secondary | ICD-10-CM | POA: Diagnosis not present

## 2015-02-03 DIAGNOSIS — M25462 Effusion, left knee: Secondary | ICD-10-CM | POA: Diagnosis not present

## 2015-02-03 DIAGNOSIS — M170 Bilateral primary osteoarthritis of knee: Secondary | ICD-10-CM | POA: Diagnosis not present

## 2015-02-09 DIAGNOSIS — N390 Urinary tract infection, site not specified: Secondary | ICD-10-CM | POA: Diagnosis not present

## 2015-02-09 DIAGNOSIS — N3941 Urge incontinence: Secondary | ICD-10-CM | POA: Diagnosis not present

## 2015-02-09 DIAGNOSIS — N401 Enlarged prostate with lower urinary tract symptoms: Secondary | ICD-10-CM | POA: Diagnosis not present

## 2015-02-09 DIAGNOSIS — B9620 Unspecified Escherichia coli [E. coli] as the cause of diseases classified elsewhere: Secondary | ICD-10-CM | POA: Diagnosis not present

## 2015-02-16 DIAGNOSIS — M25561 Pain in right knee: Secondary | ICD-10-CM | POA: Diagnosis not present

## 2015-02-16 DIAGNOSIS — M170 Bilateral primary osteoarthritis of knee: Secondary | ICD-10-CM | POA: Diagnosis not present

## 2015-02-16 DIAGNOSIS — M9904 Segmental and somatic dysfunction of sacral region: Secondary | ICD-10-CM | POA: Diagnosis not present

## 2015-02-16 DIAGNOSIS — Q72812 Congenital shortening of left lower limb: Secondary | ICD-10-CM | POA: Diagnosis not present

## 2015-02-16 DIAGNOSIS — M9905 Segmental and somatic dysfunction of pelvic region: Secondary | ICD-10-CM | POA: Diagnosis not present

## 2015-02-16 DIAGNOSIS — M461 Sacroiliitis, not elsewhere classified: Secondary | ICD-10-CM | POA: Diagnosis not present

## 2015-02-16 DIAGNOSIS — M5137 Other intervertebral disc degeneration, lumbosacral region: Secondary | ICD-10-CM | POA: Diagnosis not present

## 2015-02-16 DIAGNOSIS — M25562 Pain in left knee: Secondary | ICD-10-CM | POA: Diagnosis not present

## 2015-02-16 DIAGNOSIS — M9903 Segmental and somatic dysfunction of lumbar region: Secondary | ICD-10-CM | POA: Diagnosis not present

## 2015-02-24 DIAGNOSIS — Z0000 Encounter for general adult medical examination without abnormal findings: Secondary | ICD-10-CM | POA: Diagnosis not present

## 2015-02-24 DIAGNOSIS — N390 Urinary tract infection, site not specified: Secondary | ICD-10-CM | POA: Diagnosis not present

## 2015-02-24 DIAGNOSIS — R3915 Urgency of urination: Secondary | ICD-10-CM | POA: Diagnosis not present

## 2015-02-24 DIAGNOSIS — R351 Nocturia: Secondary | ICD-10-CM | POA: Diagnosis not present

## 2015-04-08 DIAGNOSIS — M170 Bilateral primary osteoarthritis of knee: Secondary | ICD-10-CM | POA: Diagnosis not present

## 2015-04-09 DIAGNOSIS — E611 Iron deficiency: Secondary | ICD-10-CM | POA: Diagnosis not present

## 2015-04-09 DIAGNOSIS — N183 Chronic kidney disease, stage 3 (moderate): Secondary | ICD-10-CM | POA: Diagnosis not present

## 2015-04-23 DIAGNOSIS — I10 Essential (primary) hypertension: Secondary | ICD-10-CM | POA: Diagnosis not present

## 2015-04-23 DIAGNOSIS — N189 Chronic kidney disease, unspecified: Secondary | ICD-10-CM | POA: Diagnosis not present

## 2015-04-23 DIAGNOSIS — N401 Enlarged prostate with lower urinary tract symptoms: Secondary | ICD-10-CM | POA: Diagnosis not present

## 2015-04-23 DIAGNOSIS — E785 Hyperlipidemia, unspecified: Secondary | ICD-10-CM | POA: Diagnosis not present

## 2015-04-23 DIAGNOSIS — I252 Old myocardial infarction: Secondary | ICD-10-CM | POA: Diagnosis not present

## 2015-04-23 DIAGNOSIS — Z7984 Long term (current) use of oral hypoglycemic drugs: Secondary | ICD-10-CM | POA: Diagnosis not present

## 2015-04-23 DIAGNOSIS — G4700 Insomnia, unspecified: Secondary | ICD-10-CM | POA: Diagnosis not present

## 2015-04-23 DIAGNOSIS — E1122 Type 2 diabetes mellitus with diabetic chronic kidney disease: Secondary | ICD-10-CM | POA: Diagnosis not present

## 2015-04-23 DIAGNOSIS — K219 Gastro-esophageal reflux disease without esophagitis: Secondary | ICD-10-CM | POA: Diagnosis not present

## 2015-04-23 DIAGNOSIS — D45 Polycythemia vera: Secondary | ICD-10-CM | POA: Diagnosis not present

## 2015-04-30 DIAGNOSIS — N281 Cyst of kidney, acquired: Secondary | ICD-10-CM | POA: Diagnosis not present

## 2015-04-30 DIAGNOSIS — E1129 Type 2 diabetes mellitus with other diabetic kidney complication: Secondary | ICD-10-CM | POA: Diagnosis not present

## 2015-04-30 DIAGNOSIS — C61 Malignant neoplasm of prostate: Secondary | ICD-10-CM | POA: Diagnosis not present

## 2015-04-30 DIAGNOSIS — N2581 Secondary hyperparathyroidism of renal origin: Secondary | ICD-10-CM | POA: Diagnosis not present

## 2015-04-30 DIAGNOSIS — E611 Iron deficiency: Secondary | ICD-10-CM | POA: Diagnosis not present

## 2015-04-30 DIAGNOSIS — D751 Secondary polycythemia: Secondary | ICD-10-CM | POA: Diagnosis not present

## 2015-04-30 DIAGNOSIS — I129 Hypertensive chronic kidney disease with stage 1 through stage 4 chronic kidney disease, or unspecified chronic kidney disease: Secondary | ICD-10-CM | POA: Diagnosis not present

## 2015-04-30 DIAGNOSIS — N183 Chronic kidney disease, stage 3 (moderate): Secondary | ICD-10-CM | POA: Diagnosis not present

## 2015-05-04 DIAGNOSIS — M170 Bilateral primary osteoarthritis of knee: Secondary | ICD-10-CM | POA: Diagnosis not present

## 2015-05-10 DIAGNOSIS — K219 Gastro-esophageal reflux disease without esophagitis: Secondary | ICD-10-CM | POA: Diagnosis not present

## 2015-05-10 DIAGNOSIS — E668 Other obesity: Secondary | ICD-10-CM | POA: Diagnosis not present

## 2015-05-10 DIAGNOSIS — D45 Polycythemia vera: Secondary | ICD-10-CM | POA: Diagnosis not present

## 2015-05-10 DIAGNOSIS — N183 Chronic kidney disease, stage 3 (moderate): Secondary | ICD-10-CM | POA: Diagnosis not present

## 2015-05-10 DIAGNOSIS — I2510 Atherosclerotic heart disease of native coronary artery without angina pectoris: Secondary | ICD-10-CM | POA: Diagnosis not present

## 2015-05-10 DIAGNOSIS — E784 Other hyperlipidemia: Secondary | ICD-10-CM | POA: Diagnosis not present

## 2015-05-10 DIAGNOSIS — I119 Hypertensive heart disease without heart failure: Secondary | ICD-10-CM | POA: Diagnosis not present

## 2015-05-10 DIAGNOSIS — E1121 Type 2 diabetes mellitus with diabetic nephropathy: Secondary | ICD-10-CM | POA: Diagnosis not present

## 2015-05-10 DIAGNOSIS — I25119 Atherosclerotic heart disease of native coronary artery with unspecified angina pectoris: Secondary | ICD-10-CM | POA: Diagnosis not present

## 2015-05-10 DIAGNOSIS — I214 Non-ST elevation (NSTEMI) myocardial infarction: Secondary | ICD-10-CM | POA: Diagnosis not present

## 2015-05-14 DIAGNOSIS — C61 Malignant neoplasm of prostate: Secondary | ICD-10-CM | POA: Diagnosis not present

## 2015-05-14 DIAGNOSIS — N281 Cyst of kidney, acquired: Secondary | ICD-10-CM | POA: Diagnosis not present

## 2015-05-14 DIAGNOSIS — N183 Chronic kidney disease, stage 3 (moderate): Secondary | ICD-10-CM | POA: Diagnosis not present

## 2015-05-14 DIAGNOSIS — R339 Retention of urine, unspecified: Secondary | ICD-10-CM | POA: Diagnosis not present

## 2015-06-03 DIAGNOSIS — M170 Bilateral primary osteoarthritis of knee: Secondary | ICD-10-CM | POA: Diagnosis not present

## 2015-06-11 DIAGNOSIS — M170 Bilateral primary osteoarthritis of knee: Secondary | ICD-10-CM | POA: Diagnosis not present

## 2015-06-11 DIAGNOSIS — M25461 Effusion, right knee: Secondary | ICD-10-CM | POA: Diagnosis not present

## 2015-07-08 DIAGNOSIS — M25461 Effusion, right knee: Secondary | ICD-10-CM | POA: Diagnosis not present

## 2015-07-08 DIAGNOSIS — M170 Bilateral primary osteoarthritis of knee: Secondary | ICD-10-CM | POA: Diagnosis not present

## 2015-07-08 DIAGNOSIS — M25462 Effusion, left knee: Secondary | ICD-10-CM | POA: Diagnosis not present

## 2015-07-20 DIAGNOSIS — R531 Weakness: Secondary | ICD-10-CM | POA: Diagnosis not present

## 2015-07-21 ENCOUNTER — Inpatient Hospital Stay (HOSPITAL_COMMUNITY)
Admission: EM | Admit: 2015-07-21 | Discharge: 2015-07-24 | DRG: 689 | Disposition: A | Discharge status: Skilled Nursing Facility | Payer: Medicare Other | Attending: Internal Medicine | Admitting: Internal Medicine

## 2015-07-21 ENCOUNTER — Encounter (HOSPITAL_COMMUNITY): Payer: Self-pay | Admitting: Emergency Medicine

## 2015-07-21 DIAGNOSIS — R441 Visual hallucinations: Secondary | ICD-10-CM | POA: Diagnosis present

## 2015-07-21 DIAGNOSIS — E869 Volume depletion, unspecified: Secondary | ICD-10-CM | POA: Diagnosis not present

## 2015-07-21 DIAGNOSIS — I10 Essential (primary) hypertension: Secondary | ICD-10-CM | POA: Diagnosis not present

## 2015-07-21 DIAGNOSIS — E1122 Type 2 diabetes mellitus with diabetic chronic kidney disease: Secondary | ICD-10-CM | POA: Diagnosis present

## 2015-07-21 DIAGNOSIS — Z833 Family history of diabetes mellitus: Secondary | ICD-10-CM | POA: Diagnosis not present

## 2015-07-21 DIAGNOSIS — Z885 Allergy status to narcotic agent status: Secondary | ICD-10-CM

## 2015-07-21 DIAGNOSIS — Z809 Family history of malignant neoplasm, unspecified: Secondary | ICD-10-CM | POA: Diagnosis not present

## 2015-07-21 DIAGNOSIS — D45 Polycythemia vera: Secondary | ICD-10-CM | POA: Diagnosis present

## 2015-07-21 DIAGNOSIS — Z79891 Long term (current) use of opiate analgesic: Secondary | ICD-10-CM | POA: Diagnosis not present

## 2015-07-21 DIAGNOSIS — N179 Acute kidney failure, unspecified: Secondary | ICD-10-CM

## 2015-07-21 DIAGNOSIS — Z841 Family history of disorders of kidney and ureter: Secondary | ICD-10-CM | POA: Diagnosis not present

## 2015-07-21 DIAGNOSIS — M4806 Spinal stenosis, lumbar region: Secondary | ICD-10-CM | POA: Diagnosis present

## 2015-07-21 DIAGNOSIS — Z8249 Family history of ischemic heart disease and other diseases of the circulatory system: Secondary | ICD-10-CM | POA: Diagnosis not present

## 2015-07-21 DIAGNOSIS — Z66 Do not resuscitate: Secondary | ICD-10-CM | POA: Diagnosis present

## 2015-07-21 DIAGNOSIS — R1312 Dysphagia, oropharyngeal phase: Secondary | ICD-10-CM | POA: Diagnosis not present

## 2015-07-21 DIAGNOSIS — E785 Hyperlipidemia, unspecified: Secondary | ICD-10-CM | POA: Diagnosis present

## 2015-07-21 DIAGNOSIS — E119 Type 2 diabetes mellitus without complications: Secondary | ICD-10-CM | POA: Diagnosis not present

## 2015-07-21 DIAGNOSIS — Z9119 Patient's noncompliance with other medical treatment and regimen: Secondary | ICD-10-CM | POA: Diagnosis not present

## 2015-07-21 DIAGNOSIS — N390 Urinary tract infection, site not specified: Principal | ICD-10-CM | POA: Diagnosis present

## 2015-07-21 DIAGNOSIS — M1990 Unspecified osteoarthritis, unspecified site: Secondary | ICD-10-CM | POA: Diagnosis present

## 2015-07-21 DIAGNOSIS — R339 Retention of urine, unspecified: Secondary | ICD-10-CM | POA: Diagnosis not present

## 2015-07-21 DIAGNOSIS — Z880 Allergy status to penicillin: Secondary | ICD-10-CM | POA: Diagnosis not present

## 2015-07-21 DIAGNOSIS — G92 Toxic encephalopathy: Secondary | ICD-10-CM | POA: Diagnosis present

## 2015-07-21 DIAGNOSIS — I129 Hypertensive chronic kidney disease with stage 1 through stage 4 chronic kidney disease, or unspecified chronic kidney disease: Secondary | ICD-10-CM | POA: Diagnosis present

## 2015-07-21 DIAGNOSIS — Z886 Allergy status to analgesic agent status: Secondary | ICD-10-CM | POA: Diagnosis not present

## 2015-07-21 DIAGNOSIS — Z882 Allergy status to sulfonamides status: Secondary | ICD-10-CM | POA: Diagnosis not present

## 2015-07-21 DIAGNOSIS — Z96641 Presence of right artificial hip joint: Secondary | ICD-10-CM | POA: Diagnosis present

## 2015-07-21 DIAGNOSIS — N189 Chronic kidney disease, unspecified: Secondary | ICD-10-CM

## 2015-07-21 DIAGNOSIS — R4182 Altered mental status, unspecified: Secondary | ICD-10-CM | POA: Diagnosis not present

## 2015-07-21 DIAGNOSIS — F0390 Unspecified dementia without behavioral disturbance: Secondary | ICD-10-CM | POA: Diagnosis present

## 2015-07-21 DIAGNOSIS — M159 Polyosteoarthritis, unspecified: Secondary | ICD-10-CM | POA: Diagnosis not present

## 2015-07-21 DIAGNOSIS — Z881 Allergy status to other antibiotic agents status: Secondary | ICD-10-CM

## 2015-07-21 DIAGNOSIS — B952 Enterococcus as the cause of diseases classified elsewhere: Secondary | ICD-10-CM | POA: Diagnosis present

## 2015-07-21 DIAGNOSIS — Z7984 Long term (current) use of oral hypoglycemic drugs: Secondary | ICD-10-CM | POA: Diagnosis not present

## 2015-07-21 DIAGNOSIS — Z79899 Other long term (current) drug therapy: Secondary | ICD-10-CM

## 2015-07-21 DIAGNOSIS — Z7982 Long term (current) use of aspirin: Secondary | ICD-10-CM | POA: Diagnosis not present

## 2015-07-21 DIAGNOSIS — Z888 Allergy status to other drugs, medicaments and biological substances status: Secondary | ICD-10-CM | POA: Diagnosis not present

## 2015-07-21 DIAGNOSIS — K219 Gastro-esophageal reflux disease without esophagitis: Secondary | ICD-10-CM | POA: Diagnosis present

## 2015-07-21 DIAGNOSIS — G9340 Encephalopathy, unspecified: Secondary | ICD-10-CM

## 2015-07-21 DIAGNOSIS — N183 Chronic kidney disease, stage 3 (moderate): Secondary | ICD-10-CM | POA: Diagnosis present

## 2015-07-21 DIAGNOSIS — R2681 Unsteadiness on feet: Secondary | ICD-10-CM | POA: Diagnosis not present

## 2015-07-21 DIAGNOSIS — R7989 Other specified abnormal findings of blood chemistry: Secondary | ICD-10-CM

## 2015-07-21 DIAGNOSIS — Z889 Allergy status to unspecified drugs, medicaments and biological substances status: Secondary | ICD-10-CM

## 2015-07-21 DIAGNOSIS — Z7902 Long term (current) use of antithrombotics/antiplatelets: Secondary | ICD-10-CM | POA: Diagnosis not present

## 2015-07-21 DIAGNOSIS — R338 Other retention of urine: Secondary | ICD-10-CM

## 2015-07-21 DIAGNOSIS — R531 Weakness: Secondary | ICD-10-CM | POA: Diagnosis not present

## 2015-07-21 DIAGNOSIS — G929 Unspecified toxic encephalopathy: Secondary | ICD-10-CM | POA: Diagnosis present

## 2015-07-21 DIAGNOSIS — R778 Other specified abnormalities of plasma proteins: Secondary | ICD-10-CM

## 2015-07-21 DIAGNOSIS — R404 Transient alteration of awareness: Secondary | ICD-10-CM | POA: Diagnosis not present

## 2015-07-21 DIAGNOSIS — Z5189 Encounter for other specified aftercare: Secondary | ICD-10-CM | POA: Diagnosis not present

## 2015-07-21 DIAGNOSIS — M6281 Muscle weakness (generalized): Secondary | ICD-10-CM | POA: Diagnosis not present

## 2015-07-21 LAB — URINALYSIS, ROUTINE W REFLEX MICROSCOPIC
Bilirubin Urine: NEGATIVE
Glucose, UA: NEGATIVE mg/dL
Hgb urine dipstick: NEGATIVE
KETONES UR: NEGATIVE mg/dL
Nitrite: NEGATIVE
PH: 5 (ref 5.0–8.0)
Protein, ur: NEGATIVE mg/dL
Specific Gravity, Urine: 1.02 (ref 1.005–1.030)

## 2015-07-21 LAB — COMPREHENSIVE METABOLIC PANEL
ALBUMIN: 3.3 g/dL — AB (ref 3.5–5.0)
ALK PHOS: 52 U/L (ref 38–126)
ALT: 12 U/L — ABNORMAL LOW (ref 17–63)
AST: 16 U/L (ref 15–41)
Anion gap: 5 (ref 5–15)
BUN: 41 mg/dL — ABNORMAL HIGH (ref 6–20)
CALCIUM: 9 mg/dL (ref 8.9–10.3)
CO2: 24 mmol/L (ref 22–32)
Chloride: 107 mmol/L (ref 101–111)
Creatinine, Ser: 1.85 mg/dL — ABNORMAL HIGH (ref 0.61–1.24)
GFR calc Af Amer: 36 mL/min — ABNORMAL LOW (ref >60)
GFR calc non Af Amer: 31 mL/min — ABNORMAL LOW (ref >60)
GLUCOSE: 134 mg/dL — AB (ref 65–99)
Potassium: 3.9 mmol/L (ref 3.5–5.1)
Sodium: 136 mmol/L (ref 135–145)
Total Bilirubin: 1.1 mg/dL (ref 0.3–1.2)
Total Protein: 6.2 g/dL — ABNORMAL LOW (ref 6.5–8.1)

## 2015-07-21 LAB — CBC WITH DIFFERENTIAL/PLATELET
BASOS PCT: 0 %
Basophils Absolute: 0 10*3/uL (ref 0.0–0.1)
Eosinophils Absolute: 0.1 10*3/uL (ref 0.0–0.7)
Eosinophils Relative: 1 %
HCT: 45.1 % (ref 39.0–52.0)
HEMOGLOBIN: 15.5 g/dL (ref 13.0–17.0)
Lymphocytes Relative: 10 %
Lymphs Abs: 0.8 10*3/uL (ref 0.7–4.0)
MCH: 29.5 pg (ref 26.0–34.0)
MCHC: 34.4 g/dL (ref 30.0–36.0)
MCV: 85.9 fL (ref 78.0–100.0)
MONOS PCT: 10 %
Monocytes Absolute: 0.8 10*3/uL (ref 0.1–1.0)
NEUTROS ABS: 6.6 10*3/uL (ref 1.7–7.7)
NEUTROS PCT: 79 %
PLATELETS: 110 10*3/uL — AB (ref 150–400)
RBC: 5.25 MIL/uL (ref 4.22–5.81)
RDW: 13.1 % (ref 11.5–15.5)
WBC: 8.3 10*3/uL (ref 4.0–10.5)

## 2015-07-21 LAB — URINE MICROSCOPIC-ADD ON

## 2015-07-21 LAB — TROPONIN I: Troponin I: 0.14 ng/mL — ABNORMAL HIGH (ref <0.031)

## 2015-07-21 LAB — CBG MONITORING, ED: Glucose-Capillary: 130 mg/dL — ABNORMAL HIGH (ref 65–99)

## 2015-07-21 LAB — I-STAT TROPONIN, ED
TROPONIN I, POC: 0.23 ng/mL — AB (ref 0.00–0.08)
Troponin i, poc: 0.22 ng/mL (ref 0.00–0.08)

## 2015-07-21 LAB — GLUCOSE, CAPILLARY: Glucose-Capillary: 96 mg/dL (ref 65–99)

## 2015-07-21 MED ORDER — LEVOFLOXACIN IN D5W 500 MG/100ML IV SOLN
500.0000 mg | INTRAVENOUS | Status: DC
  Administered 2015-07-23: 500 mg via INTRAVENOUS
  Filled 2015-07-21: qty 100

## 2015-07-21 MED ORDER — ASPIRIN 300 MG RE SUPP
300.0000 mg | Freq: Once | RECTAL | Status: AC
  Administered 2015-07-21: 300 mg via RECTAL
  Filled 2015-07-21: qty 1

## 2015-07-21 MED ORDER — CLOPIDOGREL BISULFATE 75 MG PO TABS
75.0000 mg | ORAL_TABLET | Freq: Every day | ORAL | Status: DC
  Administered 2015-07-22 – 2015-07-24 (×3): 75 mg via ORAL
  Filled 2015-07-21 (×4): qty 1

## 2015-07-21 MED ORDER — LISINOPRIL 10 MG PO TABS
5.0000 mg | ORAL_TABLET | Freq: Every day | ORAL | Status: DC
Start: 2015-07-21 — End: 2015-07-24
  Administered 2015-07-23 (×2): 5 mg via ORAL
  Filled 2015-07-21 (×3): qty 1

## 2015-07-21 MED ORDER — DOCUSATE SODIUM 100 MG PO CAPS
100.0000 mg | ORAL_CAPSULE | Freq: Every day | ORAL | Status: DC
  Administered 2015-07-22 – 2015-07-24 (×3): 100 mg via ORAL
  Filled 2015-07-21 (×3): qty 1

## 2015-07-21 MED ORDER — ASPIRIN 81 MG PO CHEW
324.0000 mg | CHEWABLE_TABLET | Freq: Once | ORAL | Status: DC
  Filled 2015-07-21: qty 4

## 2015-07-21 MED ORDER — PRAVASTATIN SODIUM 40 MG PO TABS
40.0000 mg | ORAL_TABLET | Freq: Every day | ORAL | Status: DC
  Administered 2015-07-23 (×2): 40 mg via ORAL
  Filled 2015-07-21 (×3): qty 1

## 2015-07-21 MED ORDER — PANTOPRAZOLE SODIUM 40 MG PO TBEC
40.0000 mg | DELAYED_RELEASE_TABLET | Freq: Every day | ORAL | Status: DC
  Administered 2015-07-22 – 2015-07-24 (×3): 40 mg via ORAL
  Filled 2015-07-21 (×3): qty 1

## 2015-07-21 MED ORDER — ADULT MULTIVITAMIN W/MINERALS CH
1.0000 | ORAL_TABLET | Freq: Every day | ORAL | Status: DC
Start: 2015-07-22 — End: 2015-07-24
  Administered 2015-07-22 – 2015-07-24 (×3): 1 via ORAL
  Filled 2015-07-21 (×3): qty 1

## 2015-07-21 MED ORDER — FINASTERIDE 5 MG PO TABS
5.0000 mg | ORAL_TABLET | Freq: Every day | ORAL | Status: DC
  Administered 2015-07-23 (×2): 5 mg via ORAL
  Filled 2015-07-21 (×3): qty 1

## 2015-07-21 MED ORDER — METOPROLOL TARTRATE 25 MG PO TABS
12.5000 mg | ORAL_TABLET | Freq: Two times a day (BID) | ORAL | Status: DC
  Administered 2015-07-22 – 2015-07-24 (×5): 12.5 mg via ORAL
  Filled 2015-07-21 (×5): qty 1

## 2015-07-21 MED ORDER — ENOXAPARIN SODIUM 30 MG/0.3ML ~~LOC~~ SOLN
30.0000 mg | SUBCUTANEOUS | Status: DC
  Administered 2015-07-21 – 2015-07-23 (×3): 30 mg via SUBCUTANEOUS
  Filled 2015-07-21 (×4): qty 0.3

## 2015-07-21 MED ORDER — INSULIN ASPART 100 UNIT/ML ~~LOC~~ SOLN
0.0000 [IU] | Freq: Three times a day (TID) | SUBCUTANEOUS | Status: DC
  Administered 2015-07-22 – 2015-07-23 (×2): 1 [IU] via SUBCUTANEOUS
  Administered 2015-07-23: 3 [IU] via SUBCUTANEOUS
  Administered 2015-07-23 – 2015-07-24 (×2): 2 [IU] via SUBCUTANEOUS

## 2015-07-21 MED ORDER — LEVOFLOXACIN IN D5W 500 MG/100ML IV SOLN: 500.0000 mg | INTRAVENOUS | Status: DC

## 2015-07-21 MED ORDER — OXYCODONE HCL 5 MG PO TABS: 5.0000 mg | ORAL_TABLET | Freq: Four times a day (QID) | ORAL | Status: DC | PRN

## 2015-07-21 MED ORDER — TAMSULOSIN HCL 0.4 MG PO CAPS
0.4000 mg | ORAL_CAPSULE | Freq: Two times a day (BID) | ORAL | Status: DC
  Administered 2015-07-22 – 2015-07-24 (×5): 0.4 mg via ORAL
  Filled 2015-07-21 (×5): qty 1

## 2015-07-21 MED ORDER — NITROGLYCERIN 0.4 MG SL SUBL
0.4000 mg | SUBLINGUAL_TABLET | SUBLINGUAL | Status: DC | PRN
Start: 2015-07-21 — End: 2015-07-24

## 2015-07-21 MED ORDER — LEVOFLOXACIN IN D5W 750 MG/150ML IV SOLN
750.0000 mg | Freq: Once | INTRAVENOUS | Status: AC
  Administered 2015-07-21: 750 mg via INTRAVENOUS
  Filled 2015-07-21: qty 150

## 2015-07-21 MED ORDER — FESOTERODINE FUMARATE ER 4 MG PO TB24
4.0000 mg | ORAL_TABLET | Freq: Every day | ORAL | Status: DC
  Administered 2015-07-22 – 2015-07-24 (×3): 4 mg via ORAL
  Filled 2015-07-21 (×4): qty 1

## 2015-07-21 MED ORDER — VITAMIN C 500 MG PO TABS
1000.0000 mg | ORAL_TABLET | Freq: Every day | ORAL | Status: DC | PRN
  Filled 2015-07-21: qty 2

## 2015-07-21 MED ORDER — ALPRAZOLAM 0.25 MG PO TABS
0.2500 mg | ORAL_TABLET | Freq: Every day | ORAL | Status: DC | PRN
  Administered 2015-07-23: 0.25 mg via ORAL
  Filled 2015-07-21: qty 1

## 2015-07-21 MED ORDER — ISOSORBIDE MONONITRATE ER 30 MG PO TB24
60.0000 mg | ORAL_TABLET | Freq: Every day | ORAL | Status: DC
  Administered 2015-07-22 – 2015-07-24 (×3): 60 mg via ORAL
  Filled 2015-07-21 (×2): qty 2
  Filled 2015-07-21: qty 1
  Filled 2015-07-21: qty 2

## 2015-07-21 MED ORDER — SODIUM CHLORIDE 0.9 % IV SOLN
INTRAVENOUS | Status: DC
  Administered 2015-07-21: 18:00:00 via INTRAVENOUS

## 2015-07-21 NOTE — Progress Notes (Signed)
Holding PO meds, pt is lethargic.  Rosine Beat, RN

## 2015-07-21 NOTE — ED Notes (Signed)
Bed: WA06 Expected date:  Expected time:  Means of arrival:  Comments: EMS - 84 M weakness, AMS - AOA

## 2015-07-21 NOTE — ED Notes (Signed)
Per EMS pt from home with hx of dementia. Pts family states pt is not at his baseline, he has had poor intake, has not been ambulatory like usual and feels his dementia is advancing.

## 2015-07-21 NOTE — ED Notes (Signed)
Abnormal lab MD Venora Maples have been made aware

## 2015-07-21 NOTE — Progress Notes (Signed)
Utilization Review completed in [place].

## 2015-07-21 NOTE — Progress Notes (Signed)
[facility] hospice coordinator Erline Levine is aware of patient  Patient is active for hospice services

## 2015-07-21 NOTE — ED Notes (Signed)
EDP aware of elevated troponin. 

## 2015-07-21 NOTE — Progress Notes (Signed)
ED CM sent information to coordinator of [facility] to check if patient is active with hospice

## 2015-07-21 NOTE — H&P (Signed)
History and Physical  Aaron Stephenson [id number] DOB: [birth year] DOA: 07/21/2015  Referring physician: ER Physician PCP: Harle Battiest, MD  Outpatient Specialists:    Patient coming from: Home  Chief Complaint: Altered mental status  HPI: 82 year old caucasian male with history of dementia, polycythemia vera, DM, HTN, OA, HL, MDS and lumbar stenosis. Patient was seen alongside patient's wife and daughter. Patient could not give any history due to lethargy. History has come from patient's Wife, daughter and ER physician. Collateral information reveals that patient has had progressive confusion and lethargy over the last 2 weeks, with associated weakness, decreased fluid intake and intermittent visual hallucination. Associated fever was also reported. On presentation to the ER, patient seems volume depleted, lethargic and UA is suggestive of possible UTI. Mild worsening of renal function is noted. Patient has baseline CKD 3.  ED Course: Hydrated and cultured. IV Levaquin started due to multiple drug allergies  Pertinent labs: Bacteruria and pyuria. Serum creatinine of 1.85. Imaging: independently reviewed.   Review of Systems: Unobtainable.   Past Medical History  Diagnosis Date  . Polycythemia vera(238.4) 02/20/2011  . DM (diabetes mellitus), type 2 ([place])   . HTN (hypertension)   . OA (osteoarthritis)   . Hyperlipidemia   . Esophagitis   . GERD (gastroesophageal reflux disease)   . Hiatal hernia   . Migraine   . Vertigo   . Thrombocytopenia ([place])   . Myeloproliferative disease ([place])   . Spinal stenosis, lumbar     Past Surgical History  Procedure Laterality Date  . Total hip arthroplasty  1993    right  . Lower leg soft tissue tumor excision       reports that he has never smoked. He has never used smokeless tobacco. He reports that he drinks alcohol. He reports that he does not use illicit drugs.  Allergies  Allergen Reactions  . Penicillins Hives, Swelling and  Anaphylaxis    Has patient had a PCN reaction causing immediate rash, facial/tongue/throat swelling, SOB or lightheadedness with hypotension: unknown Has patient had a PCN reaction causing severe rash involving mucus membranes or skin necrosis: unknown Has patient had a PCN reaction that required hospitalization: unknown Has patient had a PCN reaction occurring within the last 10 years: unknown If all of the above answers are "NO", then may proceed with Cephalosporin use. Pts family is unable to answer questions.  . Aspirin Other (See Comments)  . Ciprofloxacin Other (See Comments)    Joint swelling  . Macrodantin [Nitrofurantoin Macrocrystal] Other (See Comments)    Chest pains  . Other     "lubriderm pain pill?"  . Oxycodone Other (See Comments)    Pt took OxyContin and tramadol together and almost "died"  . Sulfamethoxazole Other (See Comments)    Unknown - doesn't remember reaction  . Tramadol Other (See Comments)    Blank stare, no expression, becoming addictive  . Unisom [Doxylamine Succinate (Sleep)] Other (See Comments)    Hangover the next day  . Xanax [Alprazolam] Other (See Comments)    loopy    Family History  Problem Relation Age of Onset  . Diabetes Mother   . Hypertension Mother   . Cancer Father   . Diabetes Brother   . Kidney failure Brother      Prior to Admission medications   Medication Sig Start Date End Date Taking? Authorizing Provider  ALPRAZolam Duanne Moron) 0.5 MG tablet Take 0.25-0.5 mg by mouth daily as needed for anxiety.    Yes Historical  Provider, MD  Ascorbic Acid (VITAMIN C) 1000 MG tablet Take 1,000 mg by mouth daily as needed (immune system support).    Yes Historical Provider, MD  clopidogrel (PLAVIX) 75 MG tablet Take 1 tablet (75 mg total) by mouth daily. 09/21/14  Yes Jacolyn Reedy, MD  docusate sodium (COLACE) 100 MG capsule Take 100 mg by mouth daily.   Yes Historical Provider, MD  esomeprazole (NEXIUM) 40 MG capsule Take 40 mg by mouth  daily as needed (for acid reflux or heartburn).    Yes Historical Provider, MD  finasteride (PROSCAR) 5 MG tablet Take 5 mg by mouth at bedtime.  06/16/14  Yes Historical Provider, MD  glipiZIDE (GLUCOTROL XL) 5 MG 24 hr tablet Take 5 mg by mouth daily.   Yes Historical Provider, MD  glucosamine-chondroitin 500-400 MG tablet Take 1 tablet by mouth daily as needed (joint supplementation).    Yes Historical Provider, MD  isosorbide mononitrate (IMDUR) 60 MG 24 hr tablet Take 1 tablet (60 mg total) by mouth daily. 09/21/14  Yes Jacolyn Reedy, MD  lisinopril (PRINIVIL,ZESTRIL) 5 MG tablet Take 5 mg by mouth at bedtime.    Yes Historical Provider, MD  metoprolol (LOPRESSOR) 50 MG tablet Take 12.5 mg by mouth 2 (two) times daily.    Yes Historical Provider, MD  Multiple Vitamins-Minerals (MULTIVITAMIN WITH MINERALS) tablet Take 1 tablet by mouth daily.   Yes Historical Provider, MD  nitroGLYCERIN (NITROSTAT) 0.4 MG SL tablet Place 1 tablet (0.4 mg total) under the tongue every 5 (five) minutes x 3 doses as needed for chest pain. 09/21/14  Yes Jacolyn Reedy, MD  pravastatin (PRAVACHOL) 40 MG tablet Take 40 mg by mouth at bedtime.  09/09/14  Yes Historical Provider, MD  tamsulosin (FLOMAX) 0.4 MG CAPS capsule Take 0.4 mg by mouth 2 (two) times daily.    Yes Historical Provider, MD  tolterodine (DETROL LA) 4 MG 24 hr capsule Take 4 mg by mouth at bedtime.   Yes Historical Provider, MD  traMADol (ULTRAM) 50 MG tablet Take 50 mg by mouth 2 (two) times daily.   Yes Historical Provider, MD  vitamin E (VITAMIN E) 400 UNIT capsule Take 400 Units by mouth daily as needed (for supplementation).    Yes Historical Provider, MD  mirtazapine (REMERON) 15 MG tablet Take 15 mg by mouth at bedtime.    Historical Provider, MD    Physical Exam: Filed Vitals:   07/21/15 1106 07/21/15 1111  BP:  112/59  Pulse:  90  Temp:  98.3 F (36.8 C)  TempSrc:  Oral  Resp:  20  SpO2: 90% 97%   Constitutional:   . Lethargic Eyes:  Pin point pupils (patient was on tramadol).    ENMT:  . Very dry buccal mucosa Neck:  . neck appears normal, no masses, normal ROM, supple  . Respiratory:  . CTA bilaterally, no w/r/r.   Cardiovascular:  . RRR, no m/r/g Fullness of the ankle  Abdomen:  . Abdomen appears normal; no tenderness or masses .   Neurologic:  . Lethargic  Wt Readings from Last 3 Encounters:  09/21/14 70.897 kg (156 lb 4.8 oz)  11/19/13 80.332 kg (177 lb 1.6 oz)  05/28/13 79.742 kg (175 lb 12.8 oz)    I have personally reviewed following labs and imaging studies  Labs on Admission:  CBC:  Recent Labs Lab 07/21/15 1152  WBC 8.3  NEUTROABS 6.6  HGB 15.5  HCT 45.1  MCV 85.9  PLT 110*  Basic Metabolic Panel:  Recent Labs Lab 07/21/15 1152  NA 136  K 3.9  CL 107  CO2 24  GLUCOSE 134*  BUN 41*  CREATININE 1.85*  CALCIUM 9.0   Liver Function Tests:  Recent Labs Lab 07/21/15 1152  AST 16  ALT 12*  ALKPHOS 52  BILITOT 1.1  PROT 6.2*  ALBUMIN 3.3*   No results for input(s): LIPASE, AMYLASE in the last 168 hours. No results for input(s): AMMONIA in the last 168 hours. Coagulation Profile: No results for input(s): INR, PROTIME in the last 168 hours. Cardiac Enzymes: No results for input(s): CKTOTAL, CKMB, CKMBINDEX, TROPONINI in the last 168 hours. BNP (last 3 results) No results for input(s): PROBNP in the last 8760 hours. HbA1C: No results for input(s): HGBA1C in the last 72 hours. CBG:  Recent Labs Lab 07/21/15 1138  GLUCAP 130*   Lipid Profile: No results for input(s): CHOL, HDL, LDLCALC, TRIG, CHOLHDL, LDLDIRECT in the last 72 hours. Thyroid Function Tests: No results for input(s): TSH, T4TOTAL, FREET4, T3FREE, THYROIDAB in the last 72 hours. Anemia Panel: No results for input(s): VITAMINB12, FOLATE, FERRITIN, TIBC, IRON, RETICCTPCT in the last 72 hours. Urine analysis:    Component Value Date/Time   COLORURINE AMBER* 07/21/2015 1156    APPEARANCEUR CLOUDY* 07/21/2015 1156   LABSPEC 1.020 07/21/2015 1156   PHURINE 5.0 07/21/2015 1156   GLUCOSEU NEGATIVE 07/21/2015 1156   HGBUR NEGATIVE 07/21/2015 1156   BILIRUBINUR NEGATIVE 07/21/2015 1156   KETONESUR NEGATIVE 07/21/2015 1156   PROTEINUR NEGATIVE 07/21/2015 1156   UROBILINOGEN 1.0 09/19/2014 1610   NITRITE NEGATIVE 07/21/2015 1156   LEUKOCYTESUR SMALL* 07/21/2015 1156   Sepsis Labs: @LABRCNTIP (procalcitonin:4,lacticidven:4) )No results found for this or any previous visit (from the past 240 hour(s)).    Radiological Exams on Admission: No results found.  Active Problems:   * No active hospital problems. *   Assessment/Plan 1. Altered Mental Status likely secondary to possibly UTI and volume depletion in a patient with baseline dementia. 2. Possible UTI 3. Volume depletion 4. AKI on CKD 3, likely pre renal 5. Dementia 6. Opiate use   Admit patient for further assessment and management  Pan culture the patient  IV antibiotics. Follow cultures and adjust antibiotics accordingly  Hydrate patient  Monitor renal function and electrolytes. Further work up of the renal function if no improvement. Check urine sodium, urine creatinine and urine protein.  DC tramadol. Oxycodone PRN. Monitor closely for opiate withdrawal  Patient is DNR  DVT prophylaxis: Lovenox Code Status:  DNR Family Communication:  Daughter and wife Disposition Plan:  Possibly home eventually versus placement   Consults called:  None   Admission status:  Inpatient    Time spent: 60 minutes  Dana Allan, MD  [facility] Pager #: [phone] 7PM-7AM contact night coverage as above  07/21/2015, 2:47 PM

## 2015-07-21 NOTE — ED Provider Notes (Signed)
CSN: [id number]     Arrival date & time 07/21/15  1059 History   First MD Initiated Contact with Patient 07/21/15 1108     Chief Complaint  Patient presents with  . Weakness     (Consider location/radiation/quality/duration/timing/severity/associated sxs/prior Treatment) Patient is a 82 y.o. male presenting with weakness. The history is provided by the patient.  Weakness This is a new problem. Episode onset: 2 weeks. The problem occurs constantly. The problem has not changed since onset.Pertinent negatives include no chest pain and no abdominal pain. Associated symptoms comments: Loss of appetite. Nothing aggravates the symptoms. Nothing relieves the symptoms. He has tried nothing for the symptoms. The treatment provided no relief.    Past Medical History  Diagnosis Date  . Polycythemia vera(238.4) 02/20/2011  . DM (diabetes mellitus), type 2 ([place])   . HTN (hypertension)   . OA (osteoarthritis)   . Hyperlipidemia   . Esophagitis   . GERD (gastroesophageal reflux disease)   . Hiatal hernia   . Migraine   . Vertigo   . Thrombocytopenia ([place])   . Myeloproliferative disease ([place])   . Spinal stenosis, lumbar    Past Surgical History  Procedure Laterality Date  . Total hip arthroplasty  1993    right  . Lower leg soft tissue tumor excision     Family History  Problem Relation Age of Onset  . Diabetes Mother   . Hypertension Mother   . Cancer Father   . Diabetes Brother   . Kidney failure Brother    Social History  Substance Use Topics  . Smoking status: Never Smoker   . Smokeless tobacco: Never Used  . Alcohol Use: Yes     Comment: rarely    Review of Systems  Cardiovascular: Negative for chest pain.  Gastrointestinal: Negative for abdominal pain.  Neurological: Positive for weakness.  All other systems reviewed and are negative.     Allergies  Penicillins; Macrodantin; Other; Tramadol; and Unisom  Home Medications   Prior to Admission medications    Medication Sig Start Date End Date Taking? Authorizing Provider  ALPRAZolam Duanne Moron) 0.5 MG tablet Take 0.5 mg by mouth daily.    Historical Provider, MD  Ascorbic Acid (VITAMIN C) 1000 MG tablet Take 1,000 mg by mouth daily as needed (immune system support).     Historical Provider, MD  aspirin EC 81 MG tablet Take 1 tablet (81 mg total) by mouth daily. 09/21/14   Jacolyn Reedy, MD  clopidogrel (PLAVIX) 75 MG tablet Take 1 tablet (75 mg total) by mouth daily. 09/21/14   Jacolyn Reedy, MD  dutasteride (AVODART) 0.5 MG capsule Take 0.5 mg by mouth daily.    Historical Provider, MD  esomeprazole (NEXIUM) 40 MG capsule Take 40 mg by mouth daily as needed (for acid reflux or heartburn).     Historical Provider, MD  finasteride (PROSCAR) 5 MG tablet Take 5 mg by mouth at bedtime.  06/16/14   Historical Provider, MD  glipiZIDE (GLUCOTROL XL) 5 MG 24 hr tablet Take 5 mg by mouth daily.    Historical Provider, MD  glucosamine-chondroitin 500-400 MG tablet Take 1 tablet by mouth daily as needed (joint supplementation).     Historical Provider, MD  isosorbide mononitrate (IMDUR) 60 MG 24 hr tablet Take 1 tablet (60 mg total) by mouth daily. 09/21/14   Jacolyn Reedy, MD  lisinopril (PRINIVIL,ZESTRIL) 5 MG tablet Take 5 mg by mouth at bedtime.     Historical Provider, MD  metoprolol (  LOPRESSOR) 50 MG tablet Take 25 mg by mouth 2 (two) times daily.    Historical Provider, MD  nitroGLYCERIN (NITROSTAT) 0.4 MG SL tablet Place 1 tablet (0.4 mg total) under the tongue every 5 (five) minutes x 3 doses as needed for chest pain. 09/21/14   Jacolyn Reedy, MD  pravastatin (PRAVACHOL) 40 MG tablet Take 40 mg by mouth at bedtime.  09/09/14   Historical Provider, MD  tamsulosin (FLOMAX) 0.4 MG CAPS capsule Take 0.4 mg by mouth 2 (two) times daily.     Historical Provider, MD  VESICARE 10 MG tablet Take 10 mg by mouth daily. 09/11/14   Historical Provider, MD  vitamin E (VITAMIN E) 400 UNIT capsule Take 400 Units by mouth  daily as needed (for supplementation).     Historical Provider, MD   BP 112/59 mmHg  Pulse 90  Temp(Src) 98.3 F (36.8 C) (Oral)  Resp 20  SpO2 97% Physical Exam  Constitutional: He appears lethargic. He appears cachectic. He appears toxic. Nasal cannula in place.  HENT:  Head: Normocephalic and atraumatic.  Eyes: Conjunctivae are normal.  Neck: Neck supple. No tracheal deviation present.  Cardiovascular: Normal rate, regular rhythm and normal heart sounds.   Pulmonary/Chest: Effort normal and breath sounds normal. No respiratory distress.  Abdominal: Soft. He exhibits no distension. There is no tenderness. There is no rigidity, no rebound and no guarding.  Neurological: He appears lethargic. He is disoriented. No cranial nerve deficit. GCS eye subscore is 2. GCS verbal subscore is 4. GCS motor subscore is 5.  Skin: Skin is warm and dry.  Psychiatric: His affect is blunt. He is slowed, withdrawn and actively hallucinating (starts pointing and repeating colors).  Falling sleep repetitively    ED Course  Procedures (including critical care time) Labs Review Labs Reviewed  CBC WITH DIFFERENTIAL/PLATELET - Abnormal; Notable for the following:    Platelets 110 (*)    All other components within normal limits  COMPREHENSIVE METABOLIC PANEL - Abnormal; Notable for the following:    Glucose, Bld 134 (*)    BUN 41 (*)    Creatinine, Ser 1.85 (*)    Total Protein 6.2 (*)    Albumin 3.3 (*)    ALT 12 (*)    GFR calc non Af Amer 31 (*)    GFR calc Af Amer 36 (*)    All other components within normal limits  URINALYSIS, ROUTINE W REFLEX MICROSCOPIC (NOT AT [facility]) - Abnormal; Notable for the following:    Color, Urine AMBER (*)    APPearance CLOUDY (*)    Leukocytes, UA SMALL (*)    All other components within normal limits  URINE MICROSCOPIC-ADD ON - Abnormal; Notable for the following:    Squamous Epithelial / LPF 0-5 (*)    Bacteria, UA MANY (*)    Casts HYALINE CASTS (*)    All  other components within normal limits  CBG MONITORING, ED - Abnormal; Notable for the following:    Glucose-Capillary 130 (*)    All other components within normal limits  I-STAT TROPOININ, ED - Abnormal; Notable for the following:    Troponin i, poc 0.22 (*)    All other components within normal limits  URINE CULTURE    Imaging Review No results found. I have personally reviewed and evaluated these images and lab results as part of my medical decision-making.   EKG Interpretation   Date/Time:  Wednesday July 21 2015 11:41:44 EDT Ventricular Rate:  89 PR Interval:  198 QRS Duration: 108 QT Interval:  396 QTC Calculation: 482 R Axis:   0 Text Interpretation:  Sinus rhythm Anterior infarct, old No significant  change since last tracing Confirmed by Esten Dollar MD, Quillian Quince [id number]) on  07/21/2015 11:59:31 AM Also confirmed by Oceania Noori MD, Quillian Quince [phone]), editor  Gilford Rile, CCT, [place] ([id number])  on 07/21/2015 12:07:44 PM      MDM   Final diagnoses:  Acute urinary retention  Acute encephalopathy  Urinary tract infection without hematuria, site unspecified  Troponin level elevated    82 y.o. male presents with Ongoing weakness, loss of appetite, lethargy and general deterioration over the last 2 weeks that has acutely worsened in the last 2 days. He has known issues with urinary retention is noncompliant with self-catheterization at home. He appears disheveled and acutely encephalopathic and his family are endorsing that he has had some visual hallucinations at home. I am concerned with his clinical appearance that he may be encephalopathic secondary to an infection but he does not appear septic and he is not febrile here. Bladder scanning and in and out catheterization yielded 750 mL of foul-smelling urine which on UA has moderate leukocytes and many bacteria suggestive of acute infection. Foley catheter was left in place for relief of urinary retention until able to reevaluate. IV Levaquin was  provided empirically pending culture with numerous listed allergies to common antibiotics. No evidence of end organ damage to suggest severe sepsis, patient does have elevation of troponin without EKG changes suggestive of type 2 NSTEMI. Provided rectal aspirin for coverage of ACS although his symptoms are highly suggestive of another process causing secondary troponin elevation. Hospitalist was consulted for admission and will see the patient in the emergency department.    Leo Grosser, MD 07/21/15 [phone]

## 2015-07-21 NOTE — ED Notes (Signed)
Pt can go to floor at 16:50.

## 2015-07-22 LAB — RENAL FUNCTION PANEL
Albumin: 2.8 g/dL — ABNORMAL LOW (ref 3.5–5.0)
Anion gap: 7 (ref 5–15)
BUN: 33 mg/dL — ABNORMAL HIGH (ref 6–20)
CO2: 21 mmol/L — ABNORMAL LOW (ref 22–32)
Calcium: 8.6 mg/dL — ABNORMAL LOW (ref 8.9–10.3)
Chloride: 109 mmol/L (ref 101–111)
Creatinine, Ser: 1.57 mg/dL — ABNORMAL HIGH (ref 0.61–1.24)
GFR calc Af Amer: 44 mL/min — ABNORMAL LOW (ref >60)
GFR calc non Af Amer: 38 mL/min — ABNORMAL LOW (ref >60)
Glucose, Bld: 66 mg/dL (ref 65–99)
Phosphorus: 2.6 mg/dL (ref 2.5–4.6)
Potassium: 3.9 mmol/L (ref 3.5–5.1)
Sodium: 137 mmol/L (ref 135–145)

## 2015-07-22 LAB — CBC WITH DIFFERENTIAL/PLATELET
Basophils Absolute: 0 10*3/uL (ref 0.0–0.1)
Basophils Relative: 0 %
Eosinophils Absolute: 0 10*3/uL (ref 0.0–0.7)
Eosinophils Relative: 0 %
HCT: 41.3 % (ref 39.0–52.0)
Hemoglobin: 14.3 g/dL (ref 13.0–17.0)
Lymphocytes Relative: 10 %
Lymphs Abs: 0.8 10*3/uL (ref 0.7–4.0)
MCH: 29 pg (ref 26.0–34.0)
MCHC: 34.6 g/dL (ref 30.0–36.0)
MCV: 83.8 fL (ref 78.0–100.0)
Monocytes Absolute: 0.7 10*3/uL (ref 0.1–1.0)
Monocytes Relative: 10 %
Neutro Abs: 6.2 10*3/uL (ref 1.7–7.7)
Neutrophils Relative %: 80 %
Platelets: 101 10*3/uL — ABNORMAL LOW (ref 150–400)
RBC: 4.93 MIL/uL (ref 4.22–5.81)
RDW: 13.1 % (ref 11.5–15.5)
WBC: 7.7 10*3/uL (ref 4.0–10.5)

## 2015-07-22 LAB — GLUCOSE, CAPILLARY
Glucose-Capillary: 100 mg/dL — ABNORMAL HIGH (ref 65–99)
Glucose-Capillary: 109 mg/dL — ABNORMAL HIGH (ref 65–99)
Glucose-Capillary: 131 mg/dL — ABNORMAL HIGH (ref 65–99)
Glucose-Capillary: 142 mg/dL — ABNORMAL HIGH (ref 65–99)
Glucose-Capillary: 161 mg/dL — ABNORMAL HIGH (ref 65–99)
Glucose-Capillary: 46 mg/dL — ABNORMAL LOW (ref 65–99)
Glucose-Capillary: 55 mg/dL — ABNORMAL LOW (ref 65–99)
Glucose-Capillary: 61 mg/dL — ABNORMAL LOW (ref 65–99)
Glucose-Capillary: 84 mg/dL (ref 65–99)

## 2015-07-22 LAB — TROPONIN I: Troponin I: 0.14 ng/mL — ABNORMAL HIGH (ref <0.031)

## 2015-07-22 MED ORDER — DEXTROSE-NACL 5-0.9 % IV SOLN
INTRAVENOUS | Status: DC
  Administered 2015-07-22: 50 mL/h via INTRAVENOUS

## 2015-07-22 MED ORDER — DEXTROSE 50 % IV SOLN
INTRAVENOUS | Status: AC
  Administered 2015-07-22: 25 mL
  Filled 2015-07-22: qty 50

## 2015-07-22 MED ORDER — DEXTROSE 50 % IV SOLN
25.0000 mL | Freq: Once | INTRAVENOUS | Status: AC
  Administered 2015-07-22: 25 mL via INTRAVENOUS

## 2015-07-22 NOTE — Progress Notes (Signed)
Hypoglycemic Event  CBG: 46 at 0015  Treatment: IV Dextrose 69ml  Symptoms: N/A  Follow-up CBG: Time:0030 CBG Result:109  Possible Reasons for Event: Not taking PO  Comments/MD notified: K. Schorr  At [place]

## 2015-07-22 NOTE — Progress Notes (Signed)
MD made aware of low cbg's , gave diet orders, change IVF to D5 NS.

## 2015-07-22 NOTE — Progress Notes (Signed)
PROGRESS NOTE    Aaron Stephenson  [id number] DOB: 1924-11-22 DOA: 07/21/2015 PCP: Harle Battiest, MD  Outpatient Specialists:   Brief Narrative: 82 year old caucasian male with history of dementia, polycythemia vera, DM, HTN, OA, HL, MDS and lumbar stenosis. Patient was seen alongside patient's wife and daughter. Patient could not give any history due to lethargy. History has come from patient's Wife, daughter and ER physician. Collateral information reveals that patient has had progressive confusion and lethargy over the last 2 weeks, with associated weakness, decreased fluid intake and intermittent visual hallucination. Associated fever was also reported. On presentation to the ER, patient seems volume depleted, lethargic and UA is suggestive of possible UTI. Mild worsening of renal function is noted. Patient has baseline CKD 3.  Assessment & Plan:   Active Problems:   Toxic encephalopathy  Assessment/Plan 1. Altered Mental Status likely secondary to possibly UTI and volume depletion in a patient with baseline dementia. Patient is improving. More communicative today. 2. Possible UTI - Urine culture is growing GPC. Follow cultures. Continue antibiotics. 3. Volume depletion - Slowly improving 4. AKI on CKD 3, likely pre renal - Slowly resolving 5. Dementia 6. Opiate use - Counseled family to need to minimize.  DVT prophylaxis: Lovenox Code Status: DNR Family Communication: Daughter and wife Disposition Plan: Possibly home eventually versus placement  Consults called: None  Admission status: Inpatient   Consultants: None.  Subjective: No new complaints. Slowly improving. Seen alongside patient's wife and daughter. Updated the family  Objective: Filed Vitals:   07/21/15 1551 07/21/15 1747 07/21/15 2102 07/22/15 0600  BP: 99/67 109/81 119/59 100/50  Pulse: 74 84 87 80  Temp: 98.7 F (37.1 C) 99 F (37.2 C) 98.4 F (36.9 C) 98.9 F (37.2 C)  TempSrc: Oral Oral Oral  Oral  Resp: 18 20 18 20   Height:  5\' 2"  (1.575 m)    Weight:  66.6 kg (146 lb 13.2 oz)    SpO2: 97% 97% 97% 100%    Intake/Output Summary (Last 24 hours) at 07/22/15 1158 Last data filed at 07/22/15 0830  Gross per 24 hour  Intake 1003.75 ml  Output    400 ml  Net 603.75 ml   Filed Weights   07/21/15 1747  Weight: 66.6 kg (146 lb 13.2 oz)    Examination:  General exam: Appears calm and comfortable  Respiratory system: Clear to auscultation.   Cardiovascular system: S1 & S2  . Gastrointestinal system: Abdomen is nondistended, soft and nontender. No organomegaly or masses felt.  Central nervous system: Awake. Moves all limbs. Extremities: No leg edema.  Data Reviewed: I have personally reviewed following labs and imaging studies  CBC:  Recent Labs Lab 07/21/15 1152 07/22/15 0259  WBC 8.3 7.7  NEUTROABS 6.6 6.2  HGB 15.5 14.3  HCT 45.1 41.3  MCV 85.9 83.8  PLT 110* [id number]*   Basic Metabolic Panel:  Recent Labs Lab 07/21/15 1152 07/22/15 0259  NA 136 137  K 3.9 3.9  CL 107 109  CO2 24 21*  GLUCOSE 134* 66  BUN 41* 33*  CREATININE 1.85* 1.57*  CALCIUM 9.0 8.6*  PHOS  --  2.6   GFR: Estimated Creatinine Clearance: 27.3 mL/min (by C-G formula based on Cr of 1.57). Liver Function Tests:  Recent Labs Lab 07/21/15 1152 07/22/15 0259  AST 16  --   ALT 12*  --   ALKPHOS 52  --   BILITOT 1.1  --   PROT 6.2*  --   ALBUMIN  3.3* 2.8*   No results for input(s): LIPASE, AMYLASE in the last 168 hours. No results for input(s): AMMONIA in the last 168 hours. Coagulation Profile: No results for input(s): INR, PROTIME in the last 168 hours. Cardiac Enzymes:  Recent Labs Lab 07/21/15 2046 07/22/15 0259  TROPONINI 0.14* 0.14*   BNP (last 3 results) No results for input(s): PROBNP in the last 8760 hours. HbA1C: No results for input(s): HGBA1C in the last 72 hours. CBG:  Recent Labs Lab 07/22/15 0312 07/22/15 0335 07/22/15 0552 07/22/15 0833  07/22/15 1105  GLUCAP 61* 100* 84 55* 142*   Lipid Profile: No results for input(s): CHOL, HDL, LDLCALC, TRIG, CHOLHDL, LDLDIRECT in the last 72 hours. Thyroid Function Tests: No results for input(s): TSH, T4TOTAL, FREET4, T3FREE, THYROIDAB in the last 72 hours. Anemia Panel: No results for input(s): VITAMINB12, FOLATE, FERRITIN, TIBC, IRON, RETICCTPCT in the last 72 hours. Urine analysis:    Component Value Date/Time   COLORURINE AMBER* 07/21/2015 1156   APPEARANCEUR CLOUDY* 07/21/2015 1156   LABSPEC 1.020 07/21/2015 1156   PHURINE 5.0 07/21/2015 1156   GLUCOSEU NEGATIVE 07/21/2015 1156   HGBUR NEGATIVE 07/21/2015 1156   BILIRUBINUR NEGATIVE 07/21/2015 1156   KETONESUR NEGATIVE 07/21/2015 1156   PROTEINUR NEGATIVE 07/21/2015 1156   UROBILINOGEN 1.0 09/19/2014 1610   NITRITE NEGATIVE 07/21/2015 1156   LEUKOCYTESUR SMALL* 07/21/2015 1156   Sepsis Labs: @LABRCNTIP (procalcitonin:4,lacticidven:4)  ) Recent Results (from the past 240 hour(s))  Urine culture     Status: None (Preliminary result)   Collection Time: 07/21/15 11:56 AM  Result Value Ref Range Status   Specimen Description URINE, CATHETERIZED  Final   Special Requests NONE  Final   Culture   Final    CULTURE REINCUBATED FOR BETTER GROWTH Performed at [facility]    Report Status PENDING  Incomplete         Radiology Studies: No results found.      Scheduled Meds: . clopidogrel  75 mg Oral Daily  . docusate sodium  100 mg Oral Daily  . enoxaparin (LOVENOX) injection  30 mg Subcutaneous Q24H  . fesoterodine  4 mg Oral Daily  . finasteride  5 mg Oral QHS  . insulin aspart  0-9 Units Subcutaneous TID WC  . isosorbide mononitrate  60 mg Oral Daily  . [START ON 07/23/2015] levofloxacin (LEVAQUIN) IV  500 mg Intravenous Q48H  . lisinopril  5 mg Oral QHS  . metoprolol  12.5 mg Oral BID  . multivitamin with minerals  1 tablet Oral Daily  . pantoprazole  40 mg Oral Daily  . pravastatin  40 mg Oral  QHS  . tamsulosin  0.4 mg Oral BID   Continuous Infusions: . sodium chloride 75 mL/hr at 07/21/15 1816  . dextrose 5 % and 0.9% NaCl 50 mL/hr (07/22/15 0858)     LOS: 1 day    Time spent: 30 Minutes.  Bonnell Public, MD [facility] Pager [phone]  If 7PM-7AM, please contact night-coverage [email or web address] Password [email or web address] 07/22/2015, 11:58 AM

## 2015-07-22 NOTE — Progress Notes (Signed)
Hypoglycemic Event  CBG: 61 at 0312  Treatment: IV Dextrose 26ml  Symptoms: N/A  Follow-up CBG: Time: 0336 CBG Result:100  Possible Reasons for Event: Pt lethargic, not taking PO.   Comments/MD notified: K. Schorr at [place]

## 2015-07-22 NOTE — Evaluation (Signed)
Clinical/Bedside Swallow Evaluation Patient Details  Name: Aaron Stephenson MRN: [id number] Date of Birth: [birth year]  Today's Date: 07/22/2015 Time: SLP Start Time (ACUTE ONLY): [id number] SLP Stop Time (ACUTE ONLY): 1545 SLP Time Calculation (min) (ACUTE ONLY): 15 min  Past Medical History:  Past Medical History  Diagnosis Date  . Polycythemia vera(238.4) 02/20/2011  . DM (diabetes mellitus), type 2 ([place])   . HTN (hypertension)   . OA (osteoarthritis)   . Hyperlipidemia   . Esophagitis   . GERD (gastroesophageal reflux disease)   . Hiatal hernia   . Migraine   . Vertigo   . Thrombocytopenia ([place])   . Myeloproliferative disease ([place])   . Spinal stenosis, lumbar    Past Surgical History:  Past Surgical History  Procedure Laterality Date  . Total hip arthroplasty  1993    right  . Lower leg soft tissue tumor excision     HPI:  82 year old caucasian male with history of dementia, polycythemia vera, DM, HTN, OA, HL, MDS and lumbar stenosis. Patient was seen alongside patient's wife and daughter. Patient could not give any history due to lethargy. History has come from patient's Wife, daughter and ER physician. Collateral information reveals that patient has had progressive confusion and lethargy over the last 2 weeks, with associated weakness, decreased fluid intake and intermittent visual hallucination. Associated fever was also reported. On presentation to the ER, patient seems volume depleted, lethargic and UA is suggestive of possible UTI. Mild worsening of renal function is noted. Patient has baseline CKD 3; nursing concerned about possible aspiration event; BSE ordered.   Assessment / Plan / Recommendation Clinical Impression   Pt exhibited oral holding intermittently and decreased oral transit with ice chips initially and puree, but this improved with subsequent swallows; pt utilized multiple swallows with thin liquids, but no other overt s/s of aspiration; pt is extremely confused  and this is certainly affecting his swallowing ability without verbal cueing and modification of sips/bites; pt is able to swallow efficiently and safely with full supervision and verbal cues to swallow paired with smaller bites/sips during po intake.  Recommend Dysphagia 3 (mechanical soft) diet with thin liquids; no straw and small bites/sips; ST will f/u 1-2x for diet tolerance to ensure safety during meals.    Aspiration Risk  Mild aspiration risk    Diet Recommendation   Dysphagia 3 (mechanical soft)/thin with small sips only  Medication Administration: Whole meds with puree    Other  Recommendations Oral Care Recommendations: Oral care BID   Follow up Recommendations  None    Frequency and Duration min 2x/week  1 week       Prognosis Prognosis for Safe Diet Advancement: Good Barriers to Reach Goals: Other (Comment) (confusion)      Swallow Study   General Date of Onset: 07/21/15 HPI: 82 year old caucasian male with history of dementia, polycythemia vera, DM, HTN, OA, HL, MDS and lumbar stenosis. Patient was seen alongside patient's wife and daughter. Patient could not give any history due to lethargy. History has come from patient's Wife, daughter and ER physician. Collateral information reveals that patient has had progressive confusion and lethargy over the last 2 weeks, with associated weakness, decreased fluid intake and intermittent visual hallucination. Associated fever was also reported. On presentation to the ER, patient seems volume depleted, lethargic and UA is suggestive of possible UTI. Mild worsening of renal function is noted. Patient has baseline CKD 3. Type of Study: Bedside Swallow Evaluation Previous Swallow Assessment:  n/a Diet Prior to this Study: Regular;Thin liquids Temperature Spikes Noted: Yes Respiratory Status: Nasal cannula History of Recent Intubation: No Behavior/Cognition: Cooperative;Confused Oral Cavity Assessment: Within Functional Limits Oral  Care Completed by SLP: No Oral Cavity - Dentition: Adequate natural dentition Vision: Functional for self-feeding Self-Feeding Abilities: Needs assist;Needs set up Patient Positioning: Upright in bed Baseline Vocal Quality: Normal Volitional Cough: Strong Volitional Swallow: Unable to elicit    Oral/Motor/Sensory Function Overall Oral Motor/Sensory Function: Within functional limits (limited d/t confusion)   Ice Chips Ice chips: Impaired Presentation: Spoon Oral Phase Functional Implications: Prolonged oral transit   Thin Liquid Thin Liquid: Impaired Presentation: Cup;Spoon Pharyngeal  Phase Impairments: Multiple swallows    Nectar Thick Nectar Thick Liquid: Not tested   Honey Thick Honey Thick Liquid: Not tested   Puree Puree: Within functional limits Presentation: Spoon   Solid      Solid: Impaired Presentation: Spoon Oral Phase Functional Implications: Prolonged oral transit    Functional Assessment Tool Used: NOMS Functional Limitations: Swallowing Swallow Current Status [id number]): At least 20 percent but less than 40 percent impaired, limited or restricted Swallow Goal Status [id number]): 0 percent impaired, limited or restricted   Hau Sanor,PAT , M.S., CCC-SLP  07/22/2015,4:11 PM

## 2015-07-23 LAB — URINE CULTURE

## 2015-07-23 LAB — GLUCOSE, CAPILLARY
Glucose-Capillary: 139 mg/dL — ABNORMAL HIGH (ref 65–99)
Glucose-Capillary: 164 mg/dL — ABNORMAL HIGH (ref 65–99)
Glucose-Capillary: 170 mg/dL — ABNORMAL HIGH (ref 65–99)
Glucose-Capillary: 222 mg/dL — ABNORMAL HIGH (ref 65–99)
Glucose-Capillary: 248 mg/dL — ABNORMAL HIGH (ref 65–99)

## 2015-07-23 LAB — CBC WITH DIFFERENTIAL/PLATELET
Basophils Absolute: 0 10*3/uL (ref 0.0–0.1)
Basophils Relative: 0 %
Eosinophils Absolute: 0.1 10*3/uL (ref 0.0–0.7)
Eosinophils Relative: 1 %
HCT: 46.4 % (ref 39.0–52.0)
Hemoglobin: 15.9 g/dL (ref 13.0–17.0)
Lymphocytes Relative: 12 %
Lymphs Abs: 1 10*3/uL (ref 0.7–4.0)
MCH: 29.6 pg (ref 26.0–34.0)
MCHC: 34.3 g/dL (ref 30.0–36.0)
MCV: 86.4 fL (ref 78.0–100.0)
Monocytes Absolute: 0.7 10*3/uL (ref 0.1–1.0)
Monocytes Relative: 8 %
Neutro Abs: 6.8 10*3/uL (ref 1.7–7.7)
Neutrophils Relative %: 79 %
Platelets: 137 10*3/uL — ABNORMAL LOW (ref 150–400)
RBC: 5.37 MIL/uL (ref 4.22–5.81)
RDW: 13.2 % (ref 11.5–15.5)
WBC: 8.5 10*3/uL (ref 4.0–10.5)

## 2015-07-23 LAB — RENAL FUNCTION PANEL
Albumin: 2.6 g/dL — ABNORMAL LOW (ref 3.5–5.0)
Anion gap: 5 (ref 5–15)
BUN: 30 mg/dL — ABNORMAL HIGH (ref 6–20)
CO2: 22 mmol/L (ref 22–32)
Calcium: 8.6 mg/dL — ABNORMAL LOW (ref 8.9–10.3)
Chloride: 107 mmol/L (ref 101–111)
Creatinine, Ser: 1.41 mg/dL — ABNORMAL HIGH (ref 0.61–1.24)
GFR calc Af Amer: 50 mL/min — ABNORMAL LOW (ref >60)
GFR calc non Af Amer: 43 mL/min — ABNORMAL LOW (ref >60)
Glucose, Bld: 202 mg/dL — ABNORMAL HIGH (ref 65–99)
Phosphorus: 1.9 mg/dL — ABNORMAL LOW (ref 2.5–4.6)
Potassium: 4.4 mmol/L (ref 3.5–5.1)
Sodium: 134 mmol/L — ABNORMAL LOW (ref 135–145)

## 2015-07-23 MED ORDER — LORAZEPAM 2 MG/ML IJ SOLN
0.5000 mg | Freq: Once | INTRAMUSCULAR | Status: AC
  Administered 2015-07-23: 0.5 mg via INTRAVENOUS
  Filled 2015-07-23: qty 1

## 2015-07-23 NOTE — Clinical Social Work Note (Signed)
Clinical Social Work Assessment  Patient Details  Name: Aaron Stephenson MRN: [id number] Date of Birth: July 29, 1924  Date of referral:  07/23/15               Reason for consult:  Facility Placement                Permission sought to share information with:  Chartered certified accountant granted to share information::  Yes, Verbal Permission Granted  Name::        Agency::     Relationship::     Contact Information:     Housing/Transportation Living arrangements for the past 2 months:  Single Family Home Source of Information:  Adult Children Patient Interpreter Needed:  None Criminal Activity/Legal Involvement Pertinent to Current Situation/Hospitalization:  No - Comment as needed Significant Relationships:  Adult Children, Spouse Lives with:  Spouse Do you feel safe going back to the place where you live?  No Need for family participation in patient care:  Yes (Comment)  Care giving concerns:  [name] received consult for SNF placement - awaiting PT evaluation.    Social Worker assessment / plan:  [name] spoke with patient's daughter, Aaron Stephenson (cell#: [phone]) to confirm plans for SNF. Patient's daughter states that patient lives with his wife (Aaron Stephenson's step-mother) who also has Dementia & daughter is concerned about her ability to care for patient.   Employment status:  Retired Forensic scientist:  Medicare PT Recommendations:  Not assessed at this time [place] / Referral to community resources:  [place]  Patient/Family's Response to care:  [name] sent information out to [facility], awaiting bed offers. Patient's daughter informed [name] that their first preference would be [place], [name] left voicemail for Aaron Stephenson (ph#: [phone]), awaiting call back re: bed availability/offer.   Patient/Family's Understanding of and Emotional Response to Diagnosis, Current Treatment, and Prognosis:    Emotional Assessment Appearance:  Appears stated  age Attitude/Demeanor/Rapport:    Affect (typically observed):    Orientation:  Oriented to Self Alcohol / Substance use:    Psych involvement (Current and /or in the community):     Discharge Needs  Concerns to be addressed:    Readmission within the last 30 days:    Current discharge risk:    Barriers to Discharge:      Standley Brooking, LCSW 07/23/2015, 10:11 AM

## 2015-07-23 NOTE — Clinical Social Work Placement (Signed)
Patient has a bed at [facility]. [name] has completed FL2 & will continue to follow and assist with discharge when ready.    Raynaldo Opitz, [facility] Clinical Social Worker cell #: [phone]     CLINICAL SOCIAL WORK PLACEMENT  NOTE  Date:  07/23/2015  Patient Details  Name: Aaron Stephenson MRN: [id number] Date of Birth: Oct 18, 1924  Clinical Social Work is seeking post-discharge placement for this patient at the [place] level of care (*[name] will initial, date and re-position this form in  chart as items are completed):  Yes   Patient/family provided with [place] Work Department's list of facilities offering this level of care within the geographic area requested by the patient (or if unable, by the patient's family).  Yes   Patient/family informed of their freedom to choose among providers that offer the needed level of care, that participate in Medicare, Medicaid or managed care program needed by the patient, have an available bed and are willing to accept the patient.  Yes   Patient/family informed of [place]'s ownership interest in [facility] and [facility], as well as of the fact that they are under no obligation to receive care at these facilities.  PASRR submitted to EDS on 07/23/15     PASRR number received on 07/23/15     Existing PASRR number confirmed on       FL2 transmitted to all facilities in geographic area requested by pt/family on 07/23/15     FL2 transmitted to all facilities within larger geographic area on       Patient informed that his/her managed care company has contracts with or will negotiate with certain facilities, including the following:        Yes   Patient/family informed of bed offers received.  Patient chooses bed at [facility]     Physician recommends and patient chooses bed at      Patient to be transferred to [facility] on  .  Patient to be transferred  to facility by       Patient family notified on   of transfer.  Name of family member notified:        PHYSICIAN       Additional Comment:    _______________________________________________ Standley Brooking, LCSW 07/23/2015, 2:57 PM

## 2015-07-23 NOTE — Progress Notes (Signed)
Report received from G. Sison,RN. No change in assessment. Continue plan of care. Aaron Stephenson

## 2015-07-23 NOTE — NC FL2 (Signed)
[place] LEVEL OF CARE SCREENING TOOL     IDENTIFICATION  Patient Name: Aaron Stephenson Birthdate: 1924-12-13 Sex: male Admission Date (Current Location): 07/21/2015  [facility] and [place] Number:  Herbalist and Address:  [facility],  [place] [address], [place]      Provider Number: [id number]  Attending Physician Name and Address:  Bonnell Public, MD  Relative Name and Phone Number:       Current Level of Care: Hospital Recommended Level of Care: [place] Prior Approval Number:    Date Approved/Denied:   PASRR Number: [id number]  Discharge Plan: SNF    Current Diagnoses: Patient Active Problem List   Diagnosis Date Noted  . Toxic encephalopathy 07/21/2015  . Kidney disease, chronic, stage III (GFR 30-59 ml/min) 09/21/2014  . Abnormal cardiovascular function study 09/21/2014  . BPH (benign prostatic hypertrophy) 09/21/2014  . Hyperlipidemia   . Chronic venous insufficiency   . NSTEMI (non-ST elevated myocardial infarction) ([place]) 09/19/2014  . Type 2 diabetes mellitus with renal manifestations ([place]) 09/19/2014  . Polycythemia vera ([place]) 02/20/2011    Orientation RESPIRATION BLADDER Height & Weight     Self  O2 (2L) Incontinent Weight: 146 lb 13.2 oz (66.6 kg) Height:  5\' 2"  (157.5 cm)  BEHAVIORAL SYMPTOMS/MOOD NEUROLOGICAL BOWEL NUTRITION STATUS      Continent Diet (Dysphasia 3 )  AMBULATORY STATUS COMMUNICATION OF NEEDS Skin   Extensive Assist Verbally Normal                       Personal Care Assistance Level of Assistance  Bathing, Dressing Bathing Assistance: Limited assistance   Dressing Assistance: Limited assistance     Functional Limitations Info             SPECIAL CARE FACTORS FREQUENCY  PT (By licensed PT), OT (By licensed OT)     PT Frequency: 5 OT Frequency: 5            Contractures      Additional Factors Info  Code Status, Allergies Code Status  Info: DNR Allergies Info: Allergies:  Penicillins, Aspirin, Ciprofloxacin, Macrodantin, Other, Oxycodone, Sulfamethoxazole, Tramadol, Unisom, Xanax           Current Medications (07/23/2015):  This is the current hospital active medication list Current Facility-Administered Medications  Medication Dose Route Frequency Provider Last Rate Last Dose  . ALPRAZolam Duanne Moron) tablet 0.25 mg  0.25 mg Oral Daily PRN Bonnell Public, MD      . clopidogrel (PLAVIX) tablet 75 mg  75 mg Oral Daily Bonnell Public, MD   75 mg at 07/23/15 0931  . docusate sodium (COLACE) capsule 100 mg  100 mg Oral Daily Bonnell Public, MD   100 mg at 07/23/15 0932  . enoxaparin (LOVENOX) injection 30 mg  30 mg Subcutaneous Q24H Bonnell Public, MD   30 mg at 07/22/15 1706  . fesoterodine (TOVIAZ) tablet 4 mg  4 mg Oral Daily Bonnell Public, MD   4 mg at 07/23/15 0931  . finasteride (PROSCAR) tablet 5 mg  5 mg Oral QHS Bonnell Public, MD   5 mg at 07/23/15 0009  . insulin aspart (novoLOG) injection 0-9 Units  0-9 Units Subcutaneous TID WC Bonnell Public, MD   1 Units at 07/23/15 [phone]  . isosorbide mononitrate (IMDUR) 24 hr tablet 60 mg  60 mg Oral Daily Bonnell Public, MD   60 mg at  07/23/15 0932  . levofloxacin (LEVAQUIN) IVPB 500 mg  500 mg Intravenous Q48H Bonnell Public, MD      . lisinopril (PRINIVIL,ZESTRIL) tablet 5 mg  5 mg Oral QHS Bonnell Public, MD   5 mg at 07/23/15 0009  . metoprolol tartrate (LOPRESSOR) tablet 12.5 mg  12.5 mg Oral BID Bonnell Public, MD   12.5 mg at 07/23/15 0932  . multivitamin with minerals tablet 1 tablet  1 tablet Oral Daily Bonnell Public, MD   1 tablet at 07/23/15 0932  . nitroGLYCERIN (NITROSTAT) SL tablet 0.4 mg  0.4 mg Sublingual Q5 Min x 3 PRN Bonnell Public, MD      . oxyCODONE (Oxy IR/ROXICODONE) immediate release tablet 5 mg  5 mg Oral Q6H PRN Bonnell Public, MD      . pantoprazole (PROTONIX) EC tablet 40 mg  40 mg Oral Daily  Bonnell Public, MD   40 mg at 07/23/15 0931  . pravastatin (PRAVACHOL) tablet 40 mg  40 mg Oral QHS Bonnell Public, MD   40 mg at 07/23/15 0009  . tamsulosin (FLOMAX) capsule 0.4 mg  0.4 mg Oral BID Bonnell Public, MD   0.4 mg at 07/23/15 0931  . vitamin C (ASCORBIC ACID) tablet 1,000 mg  1,000 mg Oral Daily PRN Bonnell Public, MD         Discharge Medications: Please see discharge summary for a list of discharge medications.  Relevant Imaging Results:  Relevant Lab Results:   Additional Information SSN: [id number]  Standley Brooking, LCSW

## 2015-07-23 NOTE — Evaluation (Signed)
Physical Therapy Evaluation Patient Details Name: Aaron Stephenson MRN: [id number] DOB: 10-15-1924 Today's Date: 07/23/2015   History of Present Illness  82 year old male with history of dementia, polycythemia vera, DM, HTN, OA, HL, MDS and lumbar stenosis and admitted for altered mental status likely due to UTI  Clinical Impression  Pt admitted with above diagnosis. Pt currently with functional limitations due to the deficits listed below (see PT Problem List).  Pt will benefit from skilled PT to increase their independence and safety with mobility to allow discharge to the venue listed below.  Pt min to mod assist for mobility currently and presents as high fall risk.  Daughter open to ST-SNF however states there has also been discussion for home with more assist.  Pt would benefit from ST-SNF upon d/c.     Follow Up Recommendations SNF;Supervision/Assistance - 24 hour    Equipment Recommendations  Rolling walker with 5" wheels    Recommendations for Other Services       Precautions / Restrictions Precautions Precautions: Fall      Mobility  Bed Mobility Overal bed mobility: Needs Assistance Bed Mobility: Supine to Sit     Supine to sit: Mod assist     General bed mobility comments: assist for cues and weakness, multimodal cues provided due to dementia  Transfers Overall transfer level: Needs assistance Equipment used: Rolling walker (2 wheeled) Transfers: Sit to/from Stand Sit to Stand: Min assist         General transfer comment: cues for safe technique, assist to rise and steady  Ambulation/Gait Ambulation/Gait assistance: Min assist Ambulation Distance (Feet): 35 Feet Assistive device: Rolling walker (2 wheeled) Gait Pattern/deviations: Step-through pattern;Narrow base of support;Trunk flexed;Decreased stride length Gait velocity: decr   General Gait Details: pt ambulates with feet touching and remains in bil knee flexion throughout gait, trunk flexion  observed and pt minimally able to correct with verbal cueing but does not maintain  Stairs            Wheelchair Mobility    Modified Rankin (Stroke Patients Only)       Balance Overall balance assessment: History of Falls                                           Pertinent Vitals/Pain Pain Assessment: Faces Faces Pain Scale: Hurts even more Pain Location: knees Pain Descriptors / Indicators: Discomfort;Grimacing Pain Intervention(s): Limited activity within patient's tolerance;Monitored during session;Repositioned    Home Living Family/patient expects to be discharged to:: Private residence Living Arrangements: Spouse/significant other Available Help at Discharge: Family;Available 24 hours/day Type of Home: House       Home Layout: One level Home Equipment: Cane - single point Additional Comments: daughter present and provided home living info    Prior Function Level of Independence: Independent with assistive device(s)         Comments: has been using SPC, spouse assists with care due to dementia     Hand Dominance        Extremity/Trunk Assessment               Lower Extremity Assessment: Generalized weakness;RLE deficits/detail;LLE deficits/detail RLE Deficits / Details: pt wearing bil knee sleeves, maintains bil knee flexion with standing however did not appear to lack extension when in supine LLE Deficits / Details: pt wearing bil knee sleeves, maintains bil knee flexion with standing  however did not appear to lack extension when in supine     Communication   Communication: HOH  Cognition Arousal/Alertness: Awake/alert Behavior During Therapy: WFL for tasks assessed/performed Overall Cognitive Status: History of cognitive impairments - at baseline                      General Comments      Exercises        Assessment/Plan    PT Assessment Patient needs continued PT services  PT Diagnosis Difficulty  walking;Generalized weakness   PT Problem List Decreased strength;Decreased knowledge of use of DME;Decreased safety awareness;Decreased balance;Decreased mobility  PT Treatment Interventions DME instruction;Gait training;Functional mobility training;Patient/family education;Therapeutic exercise;Therapeutic activities;Balance training   PT Goals (Current goals can be found in the Care Plan section) Acute Rehab PT Goals PT Goal Formulation: With patient/family Time For Goal Achievement: 08/06/15 Potential to Achieve Goals: Good    Frequency Min 3X/week   Barriers to discharge        Co-evaluation               End of Session Equipment Utilized During Treatment: Gait belt Activity Tolerance: Patient tolerated treatment well Patient left: in chair;with call bell/phone within reach;with chair alarm set;with family/visitor present Nurse Communication: Mobility status         Time: [id number] PT Time Calculation (min) (ACUTE ONLY): 23 min   Charges:   PT Evaluation $PT Eval Moderate Complexity: 1 Procedure     PT G Codes:        Casey Maxfield,KATHrine E 07/23/2015, 10:53 AM Carmelia Bake, PT, DPT 07/23/2015 Pager: [phone]

## 2015-07-23 NOTE — Progress Notes (Signed)
Nutrition Brief Note  Patient identified on the Malnutrition Screening Tool (MST) Report  Wt Readings from Last 15 Encounters:  07/21/15 146 lb 13.2 oz (66.6 kg)  09/21/14 156 lb 4.8 oz (70.897 kg)  11/19/13 177 lb 1.6 oz (80.332 kg)  05/28/13 175 lb 12.8 oz (79.742 kg)  11/21/12 178 lb 6.4 oz (80.922 kg)  05/21/12 177 lb 14.4 oz (80.695 kg)  11/21/11 181 lb 3.2 oz (82.192 kg)  05/22/11 180 lb (81.647 kg)    Body mass index is 26.85 kg/(m^2). Patient meets criteria for overweight based on current BMI. Per review, pt has lost 10 lbs (6.4% body weight) in the past 10 months which is not significant for time frame.  Pt seen by SLP 6/8 with recommendation for current diet order. Pt to go to SNF at time of d/c. No wounds indicated in flow sheets or report.   Current diet order is Dysphagia 3 with thin liquids, patient ate 50% of breakfast and 100% of lunch today. Labs and medications reviewed.   No nutrition interventions warranted at this time. If nutrition issues arise, please consult RD.      Jarome Matin, RD, LDN Inpatient Clinical Dietitian Pager # [phone] After hours/weekend pager # [phone]

## 2015-07-23 NOTE — Progress Notes (Signed)
OT Cancellation Note  Patient Details Name: LOGYN CRAIG MRN: [id number] DOB: 1925-02-03   Cancelled Treatment:    Reason Eval/Treat Not Completed: Other (comment).  MD outside of room, getting ready to see pt. Will check back later today, if schedule permits, or over the weekend.    Haliegh Khurana 07/23/2015, 2:59 PM  Lesle Chris, OTR/L [phone] 07/23/2015

## 2015-07-23 NOTE — Clinical Social Work Placement (Signed)
   CLINICAL SOCIAL WORK PLACEMENT  NOTE  Date:  07/23/2015  Patient Details  Name: Aaron Stephenson MRN: [id number] Date of Birth: [birth year]  Clinical Social Work is seeking post-discharge placement for this patient at the [place] level of care (*[name] will initial, date and re-position this form in  chart as items are completed):  Yes   Patient/family provided with [place] Work Department's list of facilities offering this level of care within the geographic area requested by the patient (or if unable, by the patient's family).  Yes   Patient/family informed of their freedom to choose among providers that offer the needed level of care, that participate in Medicare, Medicaid or managed care program needed by the patient, have an available bed and are willing to accept the patient.  Yes   Patient/family informed of  [facility]'s ownership interest in [facility] and [facility], as well as of the fact that they are under no obligation to receive care at these facilities.  PASRR submitted to EDS on 07/23/15     PASRR number received on 07/23/15     Existing PASRR number confirmed on       FL2 transmitted to all facilities in geographic area requested by pt/family on 07/23/15     FL2 transmitted to all facilities within larger geographic area on       Patient informed that his/her managed care company has contracts with or will negotiate with certain facilities, including the following:            Patient/family informed of bed offers received.  Patient chooses bed at       Physician recommends and patient chooses bed at      Patient to be transferred to   on  .  Patient to be transferred to facility by       Patient family notified on   of transfer.  Name of family member notified:        PHYSICIAN       Additional Comment:    _______________________________________________ Standley Brooking, LCSW 07/23/2015, 10:13 AM

## 2015-07-23 NOTE — Progress Notes (Signed)
PROGRESS NOTE    Aaron Stephenson  [id number] DOB: 1924-02-23 DOA: 07/21/2015 PCP: Harle Battiest, MD  Outpatient Specialists:   Brief Narrative: 82 year old caucasian male with history of dementia, polycythemia vera, DM, HTN, OA, HL, MDS and lumbar stenosis. Patient was seen alongside patient's wife and daughter. Patient could not give any history due to lethargy. History has come from patient's Wife, daughter and ER physician. Collateral information reveals that patient has had progressive confusion and lethargy over the last 2 weeks, with associated weakness, decreased fluid intake and intermittent visual hallucination. Associated fever was also reported. On presentation to the ER, patient seems volume depleted, lethargic and UA is suggestive of possible UTI. Mild worsening of renal function is noted. Patient has baseline CKD 3.  Assessment & Plan:   Active Problems:   Toxic encephalopathy  Assessment/Plan 1. Altered Mental Status likely secondary to UTI and volume depletion in a patient with baseline dementia. Patient is improving. More communicative today. 2. UTI secondary to enterococcus specie.   3. Volume depletion - Slowly improving 4. AKI on CKD 3, likely pre renal - Slowly resolving 5. Dementia 6. Opiate use - Counseled family to need to minimize. 7. Likely SNF placement in am.  DVT prophylaxis: Lovenox Code Status: DNR Family Communication: Daughter and wife Disposition Plan: Possibly home eventually versus placement  Consults called: None  Admission status: Inpatient   Consultants: None.  Subjective: No new complaints. Continues to improve.  Objective: Filed Vitals:   07/22/15 1410 07/22/15 2018 07/23/15 0616 07/23/15 1332  BP: 108/32 104/53 127/61 101/48  Pulse: 88 87 83 82  Temp: 100.9 F (38.3 C) 100.5 F (38.1 C) 98 F (36.7 C) 98 F (36.7 C)  TempSrc: Oral Oral Oral Oral  Resp: 16 18 18 16   Height:      Weight:      SpO2: 98% 97% 96% 99%     Intake/Output Summary (Last 24 hours) at 07/23/15 1836 Last data filed at 07/23/15 1700  Gross per 24 hour  Intake    420 ml  Output    550 ml  Net   -130 ml   Filed Weights   07/21/15 1747  Weight: 66.6 kg (146 lb 13.2 oz)    Examination:  General exam: Appears calm and comfortable  Respiratory system: Clear to auscultation.   Cardiovascular system: S1 & S2  . Gastrointestinal system: Abdomen is nondistended, soft and nontender. No organomegaly or masses felt.  Central nervous system: Awake. Moves all limbs. Extremities: No leg edema.  Data Reviewed: I have personally reviewed following labs and imaging studies  CBC:  Recent Labs Lab 07/21/15 1152 07/22/15 0259 07/23/15 1059  WBC 8.3 7.7 8.5  NEUTROABS 6.6 6.2 6.8  HGB 15.5 14.3 15.9  HCT 45.1 41.3 46.4  MCV 85.9 83.8 86.4  PLT 110* 101* 0000000*   Basic Metabolic Panel:  Recent Labs Lab 07/21/15 1152 07/22/15 0259 07/23/15 1312  NA 136 137 134*  K 3.9 3.9 4.4  CL 107 109 107  CO2 24 21* 22  GLUCOSE 134* 66 202*  BUN 41* 33* 30*  CREATININE 1.85* 1.57* 1.41*  CALCIUM 9.0 8.6* 8.6*  PHOS  --  2.6 1.9*   GFR: Estimated Creatinine Clearance: 30.4 mL/min (by C-G formula based on Cr of 1.41). Liver Function Tests:  Recent Labs Lab 07/21/15 1152 07/22/15 0259 07/23/15 1312  AST 16  --   --   ALT 12*  --   --   ALKPHOS 52  --   --  BILITOT 1.1  --   --   PROT 6.2*  --   --   ALBUMIN 3.3* 2.8* 2.6*   No results for input(s): LIPASE, AMYLASE in the last 168 hours. No results for input(s): AMMONIA in the last 168 hours. Coagulation Profile: No results for input(s): INR, PROTIME in the last 168 hours. Cardiac Enzymes:  Recent Labs Lab 07/21/15 2046 07/22/15 0259  TROPONINI 0.14* 0.14*   BNP (last 3 results) No results for input(s): PROBNP in the last 8760 hours. HbA1C: No results for input(s): HGBA1C in the last 72 hours. CBG:  Recent Labs Lab 07/22/15 2103 07/23/15 0003  07/23/15 0634 07/23/15 1123 07/23/15 1711  GLUCAP 161* 170* 139* 222* 164*   Lipid Profile: No results for input(s): CHOL, HDL, LDLCALC, TRIG, CHOLHDL, LDLDIRECT in the last 72 hours. Thyroid Function Tests: No results for input(s): TSH, T4TOTAL, FREET4, T3FREE, THYROIDAB in the last 72 hours. Anemia Panel: No results for input(s): VITAMINB12, FOLATE, FERRITIN, TIBC, IRON, RETICCTPCT in the last 72 hours. Urine analysis:    Component Value Date/Time   COLORURINE AMBER* 07/21/2015 1156   APPEARANCEUR CLOUDY* 07/21/2015 1156   LABSPEC 1.020 07/21/2015 1156   PHURINE 5.0 07/21/2015 1156   GLUCOSEU NEGATIVE 07/21/2015 1156   HGBUR NEGATIVE 07/21/2015 1156   [place] 07/21/2015 1156   KETONESUR NEGATIVE 07/21/2015 1156   PROTEINUR NEGATIVE 07/21/2015 1156   UROBILINOGEN 1.0 09/19/2014 1610   NITRITE NEGATIVE 07/21/2015 1156   LEUKOCYTESUR SMALL* 07/21/2015 1156   Sepsis Labs: @LABRCNTIP (procalcitonin:4,lacticidven:4)  ) Recent Results (from the past 240 hour(s))  Urine culture     Status: Abnormal   Collection Time: 07/21/15 11:56 AM  Result Value Ref Range Status   Specimen Description URINE, CATHETERIZED  Final   Special Requests NONE  Final   Culture >=100,000 COLONIES/mL ENTEROCOCCUS SPECIES (A)  Final   Report Status 07/23/2015 FINAL  Final   Organism ID, Bacteria ENTEROCOCCUS SPECIES (A)  Final      Susceptibility   Enterococcus species - MIC*    AMPICILLIN <=2 SENSITIVE Sensitive     LEVOFLOXACIN 0.25 SENSITIVE Sensitive     NITROFURANTOIN <=16 SENSITIVE Sensitive     VANCOMYCIN 2 SENSITIVE Sensitive     * >=100,000 COLONIES/mL ENTEROCOCCUS SPECIES  Culture, blood (routine x 2)     Status: None (Preliminary result)   Collection Time: 07/21/15  3:00 PM  Result Value Ref Range Status   Specimen Description BLOOD BLOOD RIGHT FOREARM  Final   Special Requests BOTTLES DRAWN AEROBIC ONLY 5CC  Final   Culture   Final    NO GROWTH 2 DAYS Performed at [facility]    Report Status PENDING  Incomplete  Culture, blood (routine x 2)     Status: None (Preliminary result)   Collection Time: 07/21/15  3:02 PM  Result Value Ref Range Status   Specimen Description BLOOD BLOOD LEFT FOREARM  Final   Special Requests BOTTLES DRAWN AEROBIC ONLY [place]  Final   Culture   Final    NO GROWTH 2 DAYS Performed at [facility]    Report Status PENDING  Incomplete  Culture, Urine     Status: Abnormal (Preliminary result)   Collection Time: 07/21/15  6:59 PM  Result Value Ref Range Status   Specimen Description URINE, CLEAN CATCH  Final   Special Requests NONE  Final   Culture >=100,000 COLONIES/mL GRAM POSITIVE COCCI (A)  Final   Report Status PENDING  Incomplete  Radiology Studies: No results found.      Scheduled Meds: . clopidogrel  75 mg Oral Daily  . docusate sodium  100 mg Oral Daily  . enoxaparin (LOVENOX) injection  30 mg Subcutaneous Q24H  . fesoterodine  4 mg Oral Daily  . finasteride  5 mg Oral QHS  . insulin aspart  0-9 Units Subcutaneous TID WC  . isosorbide mononitrate  60 mg Oral Daily  . levofloxacin (LEVAQUIN) IV  500 mg Intravenous Q48H  . lisinopril  5 mg Oral QHS  . metoprolol  12.5 mg Oral BID  . multivitamin with minerals  1 tablet Oral Daily  . pantoprazole  40 mg Oral Daily  . pravastatin  40 mg Oral QHS  . tamsulosin  0.4 mg Oral BID   Continuous Infusions:     LOS: 2 days    Time spent: 30 Minutes.  Bonnell Public, MD [facility] Pager [phone]  If 7PM-7AM, please contact night-coverage [email or web address] Password [facility] 07/23/2015, 6:36 PM

## 2015-07-23 NOTE — Progress Notes (Signed)
Speech Language Pathology Treatment:    Patient Details Name: Aaron Stephenson MRN: [id number] DOB: Dec 23, 1924 Today's Date: 07/23/2015 Time: [id number] SLP Time Calculation (min) (ACUTE ONLY): 30 min  Assessment / Plan / Recommendation Clinical Impression  Pt alert and with good tolerance of po intake today.  He was able to self - feed which helps maximize his intake and airway safety.  Pt did require cues to use spoon properly - but once demonstrated  - he was independent.    Pt consumed icecream and gingerale with timely swallow and no s/s of aspiration.  No oral holding observed as evaluating SLP noted yesterday.   Observed pt using straw with great tolerance today!  Recommend lifting straw restriction to maximize his hydration.    Pt loquacious *although he has semantic deficits due to dementia* and voice remained clear throughout session.    Recommend continue soft/thin diet with general precautions.        HPI HPI: 82 year old caucasian male with history of dementia, polycythemia vera, DM, HTN, OA, HL, MDS and lumbar stenosis. Collateral information reveals that patient has had progressive confusion and lethargy over the last 2 weeks, with associated weakness, decreased fluid intake and intermittent visual hallucination. Associated fever was also reported. On presentation to the ER, patient seems volume depleted, lethargic and UA is suggestive of possible UTI. Mild worsening of renal function is noted. Patient has baseline CKD 3..  Pt underwent swallow evaluation yesterday and today is seen for follow up to assure tolerance and for pt/family education.        SLP Plan  Continue with current plan of care     Recommendations  Diet recommendations: Dysphagia 3 (mechanical soft);Thin liquid Liquids provided via: Cup;Straw Medication Administration: Whole meds with liquid Supervision: Patient able to self feed (needs set up assist) Compensations: Minimize environmental  distractions;Slow rate;Small sips/bites Postural Changes and/or Swallow Maneuvers: Seated upright 90 degrees;Upright 30-60 min after meal             Follow up Recommendations: None Plan: Continue with current plan of care     [place], [facility] SLP [phone]

## 2015-07-24 DIAGNOSIS — R2681 Unsteadiness on feet: Secondary | ICD-10-CM | POA: Diagnosis not present

## 2015-07-24 DIAGNOSIS — M159 Polyosteoarthritis, unspecified: Secondary | ICD-10-CM | POA: Diagnosis not present

## 2015-07-24 DIAGNOSIS — E785 Hyperlipidemia, unspecified: Secondary | ICD-10-CM | POA: Diagnosis not present

## 2015-07-24 DIAGNOSIS — K219 Gastro-esophageal reflux disease without esophagitis: Secondary | ICD-10-CM | POA: Diagnosis not present

## 2015-07-24 DIAGNOSIS — I252 Old myocardial infarction: Secondary | ICD-10-CM | POA: Diagnosis not present

## 2015-07-24 DIAGNOSIS — F329 Major depressive disorder, single episode, unspecified: Secondary | ICD-10-CM | POA: Diagnosis not present

## 2015-07-24 DIAGNOSIS — F418 Other specified anxiety disorders: Secondary | ICD-10-CM | POA: Diagnosis not present

## 2015-07-24 DIAGNOSIS — N390 Urinary tract infection, site not specified: Secondary | ICD-10-CM | POA: Diagnosis not present

## 2015-07-24 DIAGNOSIS — M4806 Spinal stenosis, lumbar region: Secondary | ICD-10-CM | POA: Diagnosis not present

## 2015-07-24 DIAGNOSIS — E1122 Type 2 diabetes mellitus with diabetic chronic kidney disease: Secondary | ICD-10-CM | POA: Diagnosis not present

## 2015-07-24 DIAGNOSIS — R1312 Dysphagia, oropharyngeal phase: Secondary | ICD-10-CM | POA: Diagnosis not present

## 2015-07-24 DIAGNOSIS — F419 Anxiety disorder, unspecified: Secondary | ICD-10-CM | POA: Diagnosis not present

## 2015-07-24 DIAGNOSIS — N400 Enlarged prostate without lower urinary tract symptoms: Secondary | ICD-10-CM | POA: Diagnosis not present

## 2015-07-24 DIAGNOSIS — N3000 Acute cystitis without hematuria: Secondary | ICD-10-CM | POA: Diagnosis not present

## 2015-07-24 DIAGNOSIS — G92 Toxic encephalopathy: Secondary | ICD-10-CM | POA: Diagnosis not present

## 2015-07-24 DIAGNOSIS — I25119 Atherosclerotic heart disease of native coronary artery with unspecified angina pectoris: Secondary | ICD-10-CM | POA: Diagnosis not present

## 2015-07-24 DIAGNOSIS — N3281 Overactive bladder: Secondary | ICD-10-CM | POA: Diagnosis not present

## 2015-07-24 DIAGNOSIS — E46 Unspecified protein-calorie malnutrition: Secondary | ICD-10-CM | POA: Diagnosis not present

## 2015-07-24 DIAGNOSIS — G8929 Other chronic pain: Secondary | ICD-10-CM | POA: Diagnosis not present

## 2015-07-24 DIAGNOSIS — K5900 Constipation, unspecified: Secondary | ICD-10-CM | POA: Diagnosis not present

## 2015-07-24 DIAGNOSIS — K210 Gastro-esophageal reflux disease with esophagitis: Secondary | ICD-10-CM | POA: Diagnosis not present

## 2015-07-24 DIAGNOSIS — N183 Chronic kidney disease, stage 3 (moderate): Secondary | ICD-10-CM | POA: Diagnosis not present

## 2015-07-24 DIAGNOSIS — G9340 Encephalopathy, unspecified: Secondary | ICD-10-CM | POA: Diagnosis not present

## 2015-07-24 DIAGNOSIS — E871 Hypo-osmolality and hyponatremia: Secondary | ICD-10-CM | POA: Diagnosis not present

## 2015-07-24 DIAGNOSIS — I10 Essential (primary) hypertension: Secondary | ICD-10-CM | POA: Diagnosis not present

## 2015-07-24 DIAGNOSIS — Z5189 Encounter for other specified aftercare: Secondary | ICD-10-CM | POA: Diagnosis not present

## 2015-07-24 DIAGNOSIS — E869 Volume depletion, unspecified: Secondary | ICD-10-CM | POA: Diagnosis not present

## 2015-07-24 DIAGNOSIS — K5901 Slow transit constipation: Secondary | ICD-10-CM | POA: Diagnosis not present

## 2015-07-24 DIAGNOSIS — M6281 Muscle weakness (generalized): Secondary | ICD-10-CM | POA: Diagnosis not present

## 2015-07-24 DIAGNOSIS — N179 Acute kidney failure, unspecified: Secondary | ICD-10-CM | POA: Diagnosis not present

## 2015-07-24 DIAGNOSIS — E119 Type 2 diabetes mellitus without complications: Secondary | ICD-10-CM | POA: Diagnosis not present

## 2015-07-24 DIAGNOSIS — R5381 Other malaise: Secondary | ICD-10-CM | POA: Diagnosis not present

## 2015-07-24 LAB — URINE CULTURE: Culture: >=100000 — AB

## 2015-07-24 LAB — GLUCOSE, CAPILLARY
Glucose-Capillary: 196 mg/dL — ABNORMAL HIGH (ref 65–99)
Glucose-Capillary: 215 mg/dL — ABNORMAL HIGH (ref 65–99)

## 2015-07-24 MED ORDER — ALPRAZOLAM 0.25 MG PO TABS: 0.2500 mg | ORAL_TABLET | Freq: Every day | ORAL | Status: DC | PRN

## 2015-07-24 MED ORDER — LEVOFLOXACIN 750 MG PO TABS: 750.0000 mg | ORAL_TABLET | Freq: Every day | ORAL | Status: DC

## 2015-07-24 MED ORDER — OXYCODONE HCL 5 MG PO TABS: 5.0000 mg | ORAL_TABLET | Freq: Four times a day (QID) | ORAL | Status: DC | PRN

## 2015-07-24 NOTE — Clinical Social Work Placement (Signed)
Patient is set to discharge to [facility] today. Patient & daughter, [name] & wife at bedside made aware. Discharge packet given to RN, Manuela Schwartz. [facility] called for transport.     Raynaldo Opitz, [facility] Clinical Social Worker cell #: [phone]    CLINICAL SOCIAL WORK PLACEMENT  NOTE  Date:  07/24/2015  Patient Details  Name: Aaron Stephenson MRN: [id number] Date of Birth: 1924/11/21  Clinical Social Work is seeking post-discharge placement for this patient at the [place] level of care (*[name] will initial, date and re-position this form in  chart as items are completed):  Yes   Patient/family provided with [facility] Work Department's list of facilities offering this level of care within the geographic area requested by the patient (or if unable, by the patient's family).  Yes   Patient/family informed of their freedom to choose among providers that offer the needed level of care, that participate in Medicare, Medicaid or managed care program needed by the patient, have an available bed and are willing to accept the patient.  Yes   Patient/family informed of [facility]'s ownership interest in [facility] and [facility], as well as of the fact that they are under no obligation to receive care at these facilities.  PASRR submitted to EDS on 07/23/15     PASRR number received on 07/23/15     Existing PASRR number confirmed on       FL2 transmitted to all facilities in geographic area requested by pt/family on 07/23/15     FL2 transmitted to all facilities within larger geographic area on       Patient informed that his/her managed care company has contracts with or will negotiate with certain facilities, including the following:        Yes   Patient/family informed of bed offers received.  Patient chooses bed at [facility]     Physician recommends and patient chooses bed at      Patient to be transferred  to [facility] on 07/24/15.  Patient to be transferred to facility by [facility]     Patient family notified on 07/24/15 of transfer.  Name of family member notified:  patient's daughter, [name] via phone & wife at bedside     PHYSICIAN       Additional Comment:    _______________________________________________ Standley Brooking, LCSW 07/24/2015, 3:11 PM

## 2015-07-24 NOTE — Progress Notes (Signed)
OT Cancellation Note  Patient Details Name: Aaron Stephenson MRN: [id number] DOB: 11/20/1924   Cancelled Treatment:    Reason Eval/Treat Not Completed: Other (comment) Note plan for d/c to SNF today. Will defer OT eval to SNF.  Pauline Aus Beverly 07/24/2015, 3:12 PM

## 2015-07-24 NOTE — Discharge Summary (Signed)
Physician Discharge Summary  Patient ID: Aaron Stephenson MRN: [id number] DOB/AGE: Oct 30, 1924 82 y.o.  Admit date: 07/21/2015 Discharge date: 07/24/2015  Admission Diagnoses:  Discharge Diagnoses:  Active Problems:   Toxic encephalopathy    UTI secondary to enterococcus specie    Volume depletion    Dementia    AKI on CKD 3.  Discharged Condition: stable  Hospital Course: 81 year old caucasian male with history of dementia, polycythemia vera, DM, HTN, OA, HL, MDS and lumbar stenosis. On presentation to the hospital, patient could not give any history due to lethargy. Collateral information revealed that patient had had progressive confusion and lethargy over the last 2 weeks preceding admission, with associated weakness, decreased fluid intake and intermittent visual hallucination. Associated fever was al so reported. On presentation to the ER, patient seemed volume depleted, lethargic and UA was suggestive of possible UTI. Mild worsening of renal function was noted. Patient has baseline CKD 3.  Patient was admitted for further assessment and management. Patient was volume replete. With adequate hydration, the AKI resolved. Serum creatinine is back to baseline. Urine culture grew Enterococcus specie sensitive to levaquin. Patient was treated with IV Levaquin during the hospital stay and will be discharged back to SNF on oral levaquin. Encephalopathy has resolved. Patient will be discharged to SNF.  Consults: None  Significant Diagnostic Studies: Urine culture grew enterococcus specie. AKI has resolved. Baseline CKD 3 persists  Discharge Medication - Please see Med. Rec.  Discharge Exam: Blood pressure 118/59, pulse 78, temperature 97.6 F (36.4 C), temperature source Oral, resp. rate 18, height 5\' 2"  (1.575 m), weight 66.6 kg (146 lb 13.2 oz), SpO2 96 %.   Disposition: 01-Home or Self Care  Discharge Instructions    Call MD for:    Complete by:  As directed   Call PCP for  worsening symptoms     Diet - low sodium heart healthy    Complete by:  As directed      Diet Carb Modified    Complete by:  As directed      Increase activity slowly    Complete by:  As directed   Activity as per SNF            Medication List    STOP taking these medications        glucosamine-chondroitin 500-400 MG tablet     traMADol 50 MG tablet  Commonly known as:  ULTRAM      TAKE these medications        ALPRAZolam 0.25 MG tablet  Commonly known as:  XANAX  Take 1 tablet (0.25 mg total) by mouth daily as needed for anxiety.     clopidogrel 75 MG tablet  Commonly known as:  PLAVIX  Take 1 tablet (75 mg total) by mouth daily.     docusate sodium 100 MG capsule  Commonly known as:  COLACE  Take 100 mg by mouth daily.     esomeprazole 40 MG capsule  Commonly known as:  NEXIUM  Take 40 mg by mouth daily as needed (for acid reflux or heartburn).     finasteride 5 MG tablet  Commonly known as:  PROSCAR  Take 5 mg by mouth at bedtime.     glipiZIDE 5 MG 24 hr tablet  Commonly known as:  GLUCOTROL XL  Take 5 mg by mouth daily.     isosorbide mononitrate 60 MG 24 hr tablet  Commonly known as:  IMDUR  Take 1 tablet (60 mg  total) by mouth daily.     levofloxacin 750 MG tablet  Commonly known as:  LEVAQUIN  Take 1 tablet (750 mg total) by mouth daily.     lisinopril 5 MG tablet  Commonly known as:  PRINIVIL,ZESTRIL  Take 5 mg by mouth at bedtime.     metoprolol 50 MG tablet  Commonly known as:  LOPRESSOR  Take 12.5 mg by mouth 2 (two) times daily.     mirtazapine 15 MG tablet  Commonly known as:  REMERON  Take 15 mg by mouth at bedtime.     multivitamin with minerals tablet  Take 1 tablet by mouth daily.     nitroGLYCERIN 0.4 MG SL tablet  Commonly known as:  NITROSTAT  Place 1 tablet (0.4 mg total) under the tongue every 5 (five) minutes x 3 doses as needed for chest pain.     oxyCODONE 5 MG immediate release tablet  Commonly known as:  Oxy  IR/ROXICODONE  Take 1 tablet (5 mg total) by mouth every 6 (six) hours as needed for breakthrough pain (opiate withdrawal (patient was on tramadol for a long time)).     pravastatin 40 MG tablet  Commonly known as:  PRAVACHOL  Take 40 mg by mouth at bedtime.     tamsulosin 0.4 MG Caps capsule  Commonly known as:  FLOMAX  Take 0.4 mg by mouth 2 (two) times daily.     tolterodine 4 MG 24 hr capsule  Commonly known as:  DETROL LA  Take 4 mg by mouth at bedtime.     vitamin C 1000 MG tablet  Take 1,000 mg by mouth daily as needed (immune system support).     vitamin E 400 UNIT capsule  Generic drug:  vitamin E  Take 400 Units by mouth daily as needed (for supplementation).           Follow-up Information    Follow up with [facility], MD In 1 week.   Specialty:  Obstetrics and Gynecology   Contact information:   [address] [place] [id number] [phone]       Signed: Bonnell Public 07/24/2015, 11:10 AM

## 2015-07-26 ENCOUNTER — Encounter: Payer: Self-pay | Admitting: Adult Health

## 2015-07-26 ENCOUNTER — Other Ambulatory Visit: Payer: Self-pay | Admitting: *Deleted

## 2015-07-26 ENCOUNTER — Non-Acute Institutional Stay (SKILLED_NURSING_FACILITY): Payer: Medicare Other | Admitting: Adult Health

## 2015-07-26 DIAGNOSIS — F419 Anxiety disorder, unspecified: Secondary | ICD-10-CM | POA: Diagnosis not present

## 2015-07-26 DIAGNOSIS — E1122 Type 2 diabetes mellitus with diabetic chronic kidney disease: Secondary | ICD-10-CM | POA: Diagnosis not present

## 2015-07-26 DIAGNOSIS — E785 Hyperlipidemia, unspecified: Secondary | ICD-10-CM | POA: Diagnosis not present

## 2015-07-26 DIAGNOSIS — N183 Chronic kidney disease, stage 3 unspecified: Secondary | ICD-10-CM

## 2015-07-26 DIAGNOSIS — F329 Major depressive disorder, single episode, unspecified: Secondary | ICD-10-CM | POA: Diagnosis not present

## 2015-07-26 DIAGNOSIS — K5901 Slow transit constipation: Secondary | ICD-10-CM | POA: Diagnosis not present

## 2015-07-26 DIAGNOSIS — I252 Old myocardial infarction: Secondary | ICD-10-CM | POA: Diagnosis not present

## 2015-07-26 DIAGNOSIS — R5381 Other malaise: Secondary | ICD-10-CM | POA: Diagnosis not present

## 2015-07-26 DIAGNOSIS — K2100 Gastro-esophageal reflux disease with esophagitis, without bleeding: Secondary | ICD-10-CM

## 2015-07-26 DIAGNOSIS — N390 Urinary tract infection, site not specified: Secondary | ICD-10-CM

## 2015-07-26 DIAGNOSIS — N3281 Overactive bladder: Secondary | ICD-10-CM

## 2015-07-26 DIAGNOSIS — F32A Depression, unspecified: Secondary | ICD-10-CM

## 2015-07-26 DIAGNOSIS — G894 Chronic pain syndrome: Secondary | ICD-10-CM

## 2015-07-26 DIAGNOSIS — N400 Enlarged prostate without lower urinary tract symptoms: Secondary | ICD-10-CM | POA: Diagnosis not present

## 2015-07-26 DIAGNOSIS — K219 Gastro-esophageal reflux disease without esophagitis: Secondary | ICD-10-CM | POA: Insufficient documentation

## 2015-07-26 DIAGNOSIS — F0390 Unspecified dementia without behavioral disturbance: Secondary | ICD-10-CM

## 2015-07-26 DIAGNOSIS — I10 Essential (primary) hypertension: Secondary | ICD-10-CM | POA: Diagnosis not present

## 2015-07-26 DIAGNOSIS — E46 Unspecified protein-calorie malnutrition: Secondary | ICD-10-CM

## 2015-07-26 LAB — CULTURE, BLOOD (ROUTINE X 2)
Culture: NO GROWTH
Culture: NO GROWTH

## 2015-07-26 MED ORDER — OXYCODONE HCL 5 MG PO TABS: ORAL_TABLET | ORAL | Status: DC

## 2015-07-26 NOTE — Telephone Encounter (Signed)
[facility] 

## 2015-07-26 NOTE — Progress Notes (Signed)
Patient ID: Aaron Stephenson, male   DOB: [birth year], 82 y.o.   MRN: [id number]    DATE:  07/26/2015   MRN:  [id number]  BIRTHDAY: [birth year]  Facility:  [facility]ursing Home Location:  [place] Room Number: 1107-P  LEVEL OF CARE:  SNF (31)  Contact Information    Name Relation Home Work Mobile   Mcdaniel,[name]  [phone]     Summons,Delores Spouse [phone]         Code Status History    Date Active Date Inactive Code Status Order ID Comments User Context   07/21/2015  4:19 PM 07/24/2015  7:09 PM DNR [id number]  Bonnell Public, MD ED   07/21/2015  3:24 PM 07/21/2015  4:19 PM DNR [id number]  Bonnell Public, MD ED   09/19/2014  8:59 AM 09/21/2014  2:41 PM Full Code [id number]  Isaiah Serge, NP Inpatient    Questions for Most Recent Historical Code Status (Order [id number])    Question Answer Comment   In the event of cardiac or respiratory ARREST Do not call a "code blue"    In the event of cardiac or respiratory ARREST Do not perform Intubation, CPR, defibrillation or ACLS    In the event of cardiac or respiratory ARREST Use medication by any route, position, wound care, and other measures to relive pain and suffering. May use oxygen, suction and manual treatment of airway obstruction as needed for comfort.     Advance Directive Documentation        Most Recent Value   Type of Advance Directive  Out of facility DNR (pink MOST or yellow form)   Pre-existing out of facility DNR order (yellow form or pink MOST form)     "MOST" Form in Place?         Chief Complaint  Patient presents with  . Hospitalization Follow-up    HISTORY OF PRESENT ILLNESS:   This is an 82 year old male who has been admitted to [facility] on 07/24/15 from [facility]. He has PMH of dementia, polycythemia vera, diabetes mellitus, hypertension, osteoarthritis, hyperlipidemia, Myeloproliferative and lumbar stenosis. He was hospitalized at [facility] from 07/21/15  through 07/24/15. He was treated for volume depletion and urinary tract infection. Urine culture grew enterococcus species. He was given IV fluids and IV Levaquin. He was discharge to [facility] on oral Levaquin.  He has been admitted for a short-term rehabilitation.  PAST MEDICAL HISTORY:  Past Medical History  Diagnosis Date  . Polycythemia vera(238.4) 02/20/2011  . DM (diabetes mellitus), type 2 ([place])   . HTN (hypertension)   . OA (osteoarthritis)   . Hyperlipidemia   . Esophagitis   . GERD (gastroesophageal reflux disease)   . Hiatal hernia   . Migraine   . Vertigo   . Thrombocytopenia ([place])   . Myeloproliferative disease ([place])   . Spinal stenosis, lumbar   . Dementia   . Toxic encephalopathy   . Enterococcus UTI   . Volume depletion   . Acute kidney injury superimposed on chronic kidney disease ([place])     CKD 3     CURRENT MEDICATIONS: Reviewed  Patient's Medications  New Prescriptions   No medications on file  Previous Medications   ALPRAZOLAM (XANAX) 0.25 MG TABLET    Take 1 tablet (0.25 mg total) by mouth daily as needed for anxiety.   ASCORBIC ACID (VITAMIN C) 1000 MG TABLET    Take 1,000 mg by mouth daily as  needed (immune system support).    CLOPIDOGREL (PLAVIX) 75 MG TABLET    Take 1 tablet (75 mg total) by mouth daily.   DOCUSATE SODIUM (COLACE) 100 MG CAPSULE    Take 100 mg by mouth daily.   ESOMEPRAZOLE (NEXIUM) 40 MG CAPSULE    Take 40 mg by mouth daily as needed (for acid reflux or heartburn).    FINASTERIDE (PROSCAR) 5 MG TABLET    Take 5 mg by mouth at bedtime.    GLIPIZIDE (GLUCOTROL XL) 5 MG 24 HR TABLET    Take 5 mg by mouth daily.   ISOSORBIDE MONONITRATE (IMDUR) 60 MG 24 HR TABLET    Take 1 tablet (60 mg total) by mouth daily.   LEVOFLOXACIN (LEVAQUIN) 750 MG TABLET    Take 1 tablet (750 mg total) by mouth daily.   LISINOPRIL (PRINIVIL,ZESTRIL) 5 MG TABLET    Take 5 mg by mouth at bedtime.    METOPROLOL TARTRATE (LOPRESSOR) 25 MG TABLET    Take 25 mg  by mouth.   MIRTAZAPINE (REMERON) 15 MG TABLET    Take 15 mg by mouth at bedtime.   MULTIPLE VITAMINS-MINERALS (MULTIVITAMIN WITH MINERALS) TABLET    Take 1 tablet by mouth daily.   NITROGLYCERIN (NITROSTAT) 0.4 MG SL TABLET    Place 1 tablet (0.4 mg total) under the tongue every 5 (five) minutes x 3 doses as needed for chest pain.   PRAVASTATIN (PRAVACHOL) 40 MG TABLET    Take 40 mg by mouth at bedtime.    TAMSULOSIN (FLOMAX) 0.4 MG CAPS CAPSULE    Take 0.4 mg by mouth daily.    TOLTERODINE (DETROL LA) 4 MG 24 HR CAPSULE    Take 4 mg by mouth at bedtime.   VITAMIN E (VITAMIN E) 400 UNIT CAPSULE    Take 400 Units by mouth daily as needed (for supplementation).   Modified Medications   Modified Medication Previous Medication   OXYCODONE (OXY IR/ROXICODONE) 5 MG IMMEDIATE RELEASE TABLET oxyCODONE (OXY IR/ROXICODONE) 5 MG immediate release tablet      Take one tablet by mouth every 6 hours as needed for breakthrough pain    Take 1 tablet (5 mg total) by mouth every 6 (six) hours as needed for breakthrough pain (opiate withdrawal (patient was on tramadol for a long time)).  Discontinued Medications   METOPROLOL (LOPRESSOR) 50 MG TABLET    Take 12.5 mg by mouth 2 (two) times daily.      Allergies  Allergen Reactions  . Penicillins Hives, Swelling and Anaphylaxis    Has patient had a PCN reaction causing immediate rash, facial/tongue/throat swelling, SOB or lightheadedness with hypotension: unknown Has patient had a PCN reaction causing severe rash involving mucus membranes or skin necrosis: unknown Has patient had a PCN reaction that required hospitalization: unknown Has patient had a PCN reaction occurring within the last 10 years: unknown If all of the above answers are "NO", then may proceed with Cephalosporin use. Pts family is unable to answer questions.  . Aspirin Other (See Comments)  . Ciprofloxacin Other (See Comments)    Joint swelling  . Macrodantin [Nitrofurantoin Macrocrystal]  Other (See Comments)    Chest pains  . Other     "lubriderm pain pill?"  . Oxycodone Other (See Comments)    Pt took OxyContin and tramadol together and almost "died"  . Sulfamethoxazole Other (See Comments)    Unknown - doesn't remember reaction  . Tramadol Other (See Comments)    Blank stare,  no expression, becoming addictive  . Unisom [Doxylamine Succinate (Sleep)] Other (See Comments)    Hangover the next day  . Xanax [Alprazolam] Other (See Comments)    loopy     REVIEW OF SYSTEMS:  GENERAL: no change in appetite, no fatigue, no weight changes, no fever, chills or weakness EYES: Denies change in vision, dry eyes, eye pain, itching or discharge EARS: Denies change in hearing, ringing in ears, or earache NOSE: Denies nasal congestion or epistaxis MOUTH and THROAT: Denies oral discomfort, gingival pain or bleeding, pain from teeth or hoarseness   RESPIRATORY: no cough, SOB, DOE, wheezing, hemoptysis CARDIAC: no chest pain, edema or palpitations GI: no abdominal pain, diarrhea, constipation, heart burn, nausea or vomiting GU: Denies dysuria, frequency, hematuria, incontinence, or discharge PSYCHIATRIC: Denies feeling of depression or anxiety. No report of hallucinations, insomnia, paranoia, or agitation    PHYSICAL EXAMINATION  GENERAL APPEARANCE: Well nourished. In no acute distress. Normal body habitus SKIN:  Skin is warm and dry.  HEAD: Normal in size and contour. No evidence of trauma EYES: Lids open and close normally. No blepharitis, entropion or ectropion. PERRL. Conjunctivae are clear and sclerae are white. Lenses are without opacity EARS: Pinnae are normal. Patient hears normal voice tunes of the examiner MOUTH and THROAT: Lips are without lesions. Oral mucosa is moist and without lesions. Tongue is normal in shape, size, and color and without lesions NECK: supple, trachea midline, no neck masses, no thyroid tenderness, no thyromegaly LYMPHATICS: no LAN in the neck,  no supraclavicular LAN RESPIRATORY: breathing is even & unlabored, BS CTAB CARDIAC: RRR, no murmur,no extra heart sounds, no edema GI: abdomen soft, normal BS, no masses, no tenderness, no hepatomegaly, no splenomegaly EXTREMITIES:  Able to move 4 extremities PSYCHIATRIC: Alert to person and disoriented to time and place. Affect and behavior are appropriate  LABS/RADIOLOGY: Labs reviewed: Basic Metabolic Panel:  Recent Labs  09/19/14 1047  07/21/15 1152 07/22/15 0259 07/23/15 1312  NA 140  < > 136 137 134*  K 4.0  < > 3.9 3.9 4.4  CL 104  < > 107 109 107  CO2 27  < > 24 21* 22  GLUCOSE 144*  < > 134* 66 202*  BUN 30*  < > 41* 33* 30*  CREATININE 1.70*  < > 1.85* 1.57* 1.41*  CALCIUM 9.5  < > 9.0 8.6* 8.6*  MG 2.0  --   --   --   --   PHOS  --   --   --  2.6 1.9*  < > = values in this interval not displayed. Liver Function Tests:  Recent Labs  09/18/14 1811 09/19/14 1047 07/21/15 1152 07/22/15 0259 07/23/15 1312  AST 22 20 16   --   --   ALT 16* 14* 12*  --   --   ALKPHOS 49 45 52  --   --   BILITOT 1.0 1.0 1.1  --   --   PROT 7.3 5.8* 6.2*  --   --   ALBUMIN 3.9 3.2* 3.3* 2.8* 2.6*   CBC:  Recent Labs  07/21/15 1152 07/22/15 0259 07/23/15 1059  WBC 8.3 7.7 8.5  NEUTROABS 6.6 6.2 6.8  HGB 15.5 14.3 15.9  HCT 45.1 41.3 46.4  MCV 85.9 83.8 86.4  PLT 110* 101* 137*   Lipid Panel:  Recent Labs  09/19/14 1049  HDL 47   Cardiac Enzymes:  Recent Labs  09/19/14 2105 07/21/15 2046 07/22/15 0259  TROPONINI 0.14* 0.14* 0.14*  CBG:  Recent Labs  07/23/15 2352 07/24/15 0707 07/24/15 1200  GLUCAP 248* 196* 215*       ASSESSMENT/PLAN:  Physical deconditioning -  for rehabilitation, PT, OT and ST  Urinary tract infection - Was given IV Levaquin in the hospital and discharged on oral Levaquin 750 mg 1 tab by mouth daily 3 days; check CBC  Hypertension - continue metoprolol tartrate 25 mg 1 tab by mouth twice a day, Lisinopril 5 mg 1 tab by  mouth daily at bedtime  Anxiety - continue Xanax 0.25 mg 1 tab by mouth daily when necessary  GERD - continue Nexium 40 mg 1 capsule by mouth daily when necessary  BPH - continue Proscar 5 mg 1 tab by mouth daily at bedtime, Flomax 0.4 mg 1 capsule by mouth daily  Hx of NSTEMI - continue Plavix 75 mg 1 tab by mouth daily and Imdur ER 60 mg 1 tab by mouth daily and NTG when necessary  Constipation - tinea Colace 100 mg 1 capsule by mouth daily  Diabetes mellitus, type II - continue glipizide 5 mg 1 tab by mouth daily; check hemoglobin A1c and CBG daily  Depression - mood this is stable; continue Remeron 15 mg 1 tab by mouth daily at bedtime  Hyperlipidemia - continue Pravachol 40 mg 1 tab by mouth daily at bedtime  Overactive bladder - continue Detrol LA 4 mg 1 capsule by mouth daily at bedtime  Chronic pain - patient was on tramadol for a long time and was recently discontinued; continue oxycodone 5 mg 1 tab by mouth every 6 hours when necessary for pain  Dementia - continue supportive care; fall precaution   Chronic kidney disease, stage III - check BMP Lab Results  Component Value Date   CREATININE 1.41* 07/23/2015    Protein calorie malnutrition - albumin 3.3; start Procel one scoop by mouth twice a day     Goals of care:  Short-term rehabilitation     Durenda Age, NP [place] [phone]

## 2015-07-27 ENCOUNTER — Non-Acute Institutional Stay (SKILLED_NURSING_FACILITY): Payer: Medicare Other | Admitting: Internal Medicine

## 2015-07-27 ENCOUNTER — Encounter: Payer: Self-pay | Admitting: Internal Medicine

## 2015-07-27 DIAGNOSIS — E1122 Type 2 diabetes mellitus with diabetic chronic kidney disease: Secondary | ICD-10-CM | POA: Diagnosis not present

## 2015-07-27 DIAGNOSIS — N400 Enlarged prostate without lower urinary tract symptoms: Secondary | ICD-10-CM

## 2015-07-27 DIAGNOSIS — G8929 Other chronic pain: Secondary | ICD-10-CM

## 2015-07-27 DIAGNOSIS — E871 Hypo-osmolality and hyponatremia: Secondary | ICD-10-CM

## 2015-07-27 DIAGNOSIS — F418 Other specified anxiety disorders: Secondary | ICD-10-CM | POA: Diagnosis not present

## 2015-07-27 DIAGNOSIS — N3000 Acute cystitis without hematuria: Secondary | ICD-10-CM | POA: Diagnosis not present

## 2015-07-27 DIAGNOSIS — I25119 Atherosclerotic heart disease of native coronary artery with unspecified angina pectoris: Secondary | ICD-10-CM | POA: Diagnosis not present

## 2015-07-27 DIAGNOSIS — K5900 Constipation, unspecified: Secondary | ICD-10-CM | POA: Diagnosis not present

## 2015-07-27 DIAGNOSIS — R5381 Other malaise: Secondary | ICD-10-CM

## 2015-07-27 DIAGNOSIS — K219 Gastro-esophageal reflux disease without esophagitis: Secondary | ICD-10-CM

## 2015-07-27 DIAGNOSIS — N183 Chronic kidney disease, stage 3 unspecified: Secondary | ICD-10-CM

## 2015-07-27 DIAGNOSIS — E46 Unspecified protein-calorie malnutrition: Secondary | ICD-10-CM

## 2015-07-27 DIAGNOSIS — R4189 Other symptoms and signs involving cognitive functions and awareness: Secondary | ICD-10-CM

## 2015-07-27 DIAGNOSIS — I10 Essential (primary) hypertension: Secondary | ICD-10-CM

## 2015-07-27 NOTE — Progress Notes (Signed)
LOCATION: [place]  PCP: Harle Battiest, MD   Code Status: DNR  Goals of care: Advanced Directive information Advanced Directives 07/26/2015  Does patient have an advance directive? Yes  Type of Advance Directive Out of facility DNR (pink MOST or yellow form)  Does patient want to make changes to advanced directive? No - Patient declined  Copy of advanced directive(s) in chart? Yes  Would patient like information on creating an advanced directive? -       Extended Emergency Contact Information Primary Emergency Contact: Shostak,[name]  [place] Phone: [phone] Relation: Daughter Secondary Emergency Contact: Luginbill,Delores Address: [place], [place] Phone: [phone] Mobile Phone: [phone] Relation: Spouse   Allergies  Allergen Reactions  . Penicillins Hives, Swelling and Anaphylaxis    Has patient had a PCN reaction causing immediate rash, facial/tongue/throat swelling, SOB or lightheadedness with hypotension: unknown Has patient had a PCN reaction causing severe rash involving mucus membranes or skin necrosis: unknown Has patient had a PCN reaction that required hospitalization: unknown Has patient had a PCN reaction occurring within the last 10 years: unknown If all of the above answers are "NO", then may proceed with Cephalosporin use. Pts family is unable to answer questions.  . Aspirin Other (See Comments)  . Ciprofloxacin Other (See Comments)    Joint swelling  . Macrodantin [Nitrofurantoin Macrocrystal] Other (See Comments)    Chest pains  . Other     "lubriderm pain pill?"  . Oxycodone Other (See Comments)    Pt took OxyContin and tramadol together and almost "died"  . Sulfamethoxazole Other (See Comments)    Unknown - doesn't remember reaction  . Tramadol Other (See Comments)    Blank stare, no expression, becoming addictive  . Unisom [Doxylamine Succinate (Sleep)]  Other (See Comments)    Hangover the next day  . Xanax [Alprazolam] Other (See Comments)    loopy    Chief Complaint  Patient presents with  . New Admit To SNF    New Admission     HPI:  Patient is a 82 y.o. male seen today for short term rehabilitation post hospital admission from 07/21/15-07/24/15 with toxic encephalopathy with urinary tract infection and acute kidney injury. He was given iv fluids and iv antibiotics. He made clinical improvement. He is seen in his room today with his wife present.   Review of Systems:  Constitutional: Negative for fever, chills, diaphoresis. Energy level slowly coming back.  HENT: Negative for headache, congestion, nasal discharge, hearing loss, sore throat, difficulty swallowing.   Eyes: Negative for blurred vision, double vision and discharge. Wears glasses. Respiratory: Negative for cough, shortness of breath and wheezing.   Cardiovascular: Negative for chest pain, palpitations, leg swelling.  Gastrointestinal: Negative for heartburn, nausea, vomiting, abdominal pain. Last bowel movement was today.  Genitourinary: Negative for dysuria and flank pain.  Musculoskeletal: Negative for back pain, fall in the facility.  Skin: Negative for itching, rash.  Neurological: Negative for dizziness. Psychiatric/Behavioral: Negative for depression. has memory loss.    Past Medical History  Diagnosis Date  . Polycythemia vera(238.4) 02/20/2011  . DM (diabetes mellitus), type 2 ([place])   . HTN (hypertension)   . OA (osteoarthritis)   . Hyperlipidemia   . Esophagitis   . GERD (gastroesophageal reflux disease)   . Hiatal hernia   . Migraine   . Vertigo   . Thrombocytopenia ([place])   .  Myeloproliferative disease ([place])   . Spinal stenosis, lumbar   . Dementia   . Toxic encephalopathy   . Enterococcus UTI   . Volume depletion   . Acute kidney injury superimposed on chronic kidney disease ([place])     CKD 3   Past Surgical History  Procedure Laterality Date    . Total hip arthroplasty  1993    right  . Lower leg soft tissue tumor excision     Social History:   reports that he has never smoked. He has never used smokeless tobacco. He reports that he drinks alcohol. He reports that he does not use illicit drugs.  Family History  Problem Relation Age of Onset  . Diabetes Mother   . Hypertension Mother   . Cancer Father   . Diabetes Brother   . Kidney failure Brother     Medications:   Medication List       This list is accurate as of: 07/27/15  2:46 PM.  Always use your most recent med list.               ALPRAZolam 0.25 MG tablet  Commonly known as:  XANAX  Take 1 tablet (0.25 mg total) by mouth daily as needed for anxiety.     clopidogrel 75 MG tablet  Commonly known as:  PLAVIX  Take 1 tablet (75 mg total) by mouth daily.     docusate sodium 100 MG capsule  Commonly known as:  COLACE  Take 100 mg by mouth daily.     esomeprazole 40 MG capsule  Commonly known as:  NEXIUM  Take 40 mg by mouth daily as needed (for acid reflux or heartburn).     finasteride 5 MG tablet  Commonly known as:  PROSCAR  Take 5 mg by mouth at bedtime.     glipiZIDE 5 MG 24 hr tablet  Commonly known as:  GLUCOTROL XL  Take 5 mg by mouth daily.     isosorbide mononitrate 60 MG 24 hr tablet  Commonly known as:  IMDUR  Take 1 tablet (60 mg total) by mouth daily.     levofloxacin 750 MG tablet  Commonly known as:  LEVAQUIN  Take 1 tablet (750 mg total) by mouth daily.     lisinopril 5 MG tablet  Commonly known as:  PRINIVIL,ZESTRIL  Take 5 mg by mouth at bedtime.     metoprolol tartrate 25 MG tablet  Commonly known as:  LOPRESSOR  Take 25 mg by mouth 2 (two) times daily.     mirtazapine 15 MG tablet  Commonly known as:  REMERON  Take 15 mg by mouth at bedtime.     multivitamin with minerals tablet  Take 1 tablet by mouth daily.     nitroGLYCERIN 0.4 MG SL tablet  Commonly known as:  NITROSTAT  Place 1 tablet (0.4 mg total)  under the tongue every 5 (five) minutes x 3 doses as needed for chest pain.     oxyCODONE 5 MG immediate release tablet  Commonly known as:  Oxy IR/ROXICODONE  Take one tablet by mouth every 6 hours as needed for breakthrough pain     pravastatin 40 MG tablet  Commonly known as:  PRAVACHOL  Take 40 mg by mouth at bedtime. Reported on 07/27/2015     PROCEL Powd  Take 1 scoop by mouth 2 (two) times daily.     tamsulosin 0.4 MG Caps capsule  Commonly known as:  FLOMAX  Take 0.4 mg  by mouth daily.     tolterodine 4 MG 24 hr capsule  Commonly known as:  DETROL LA  Take 4 mg by mouth at bedtime.     vitamin C 1000 MG tablet  Take 1,000 mg by mouth daily as needed (immune system support).     vitamin E 400 UNIT capsule  Generic drug:  vitamin E  Take 400 Units by mouth daily as needed (for supplementation).        Immunizations:  There is no immunization history on file for this patient.   Physical Exam:  Filed Vitals:   07/27/15 1438  BP: 109/70  Pulse: 92  Temp: 98.6 F (37 C)  TempSrc: Oral  Resp: 18  Height: 5\' 2"  (1.575 m)  Weight: 146 lb (66.225 kg)  SpO2: 95%   Body mass index is 26.7 kg/(m^2).  General- elderly male, well built, in no acute distress Head- normocephalic, atraumatic Nose- no maxillary or frontal sinus tenderness, no nasal discharge Throat- moist mucus membrane  Eyes- PERRLA, EOMI, no pallor, no icterus, no discharge, normal conjunctiva, normal sclera Neck- no cervical lymphadenopathy Cardiovascular- normal s1,s2, no murmur, no leg edema Respiratory- bilateral clear to auscultation, no wheeze, no rhonchi, no crackles, no use of accessory muscles Abdomen- bowel sounds present, soft, non tender Musculoskeletal- able to move all 4 extremities, generalized weakness, unsteady gait, arthritis changes to his fingers Neurological- alert and oriented to person only Skin- warm and dry, easy bruising Psychiatry- normal mood and affect    Labs  reviewed: Basic Metabolic Panel:  Recent Labs  09/19/14 1047  07/21/15 1152 07/22/15 0259 07/23/15 1312  NA 140  < > 136 137 134*  K 4.0  < > 3.9 3.9 4.4  CL 104  < > 107 109 107  CO2 27  < > 24 21* 22  GLUCOSE 144*  < > 134* 66 202*  BUN 30*  < > 41* 33* 30*  CREATININE 1.70*  < > 1.85* 1.57* 1.41*  CALCIUM 9.5  < > 9.0 8.6* 8.6*  MG 2.0  --   --   --   --   PHOS  --   --   --  2.6 1.9*  < > = values in this interval not displayed. Liver Function Tests:  Recent Labs  09/18/14 1811 09/19/14 1047 07/21/15 1152 07/22/15 0259 07/23/15 1312  AST 22 20 16   --   --   ALT 16* 14* 12*  --   --   ALKPHOS 49 45 52  --   --   BILITOT 1.0 1.0 1.1  --   --   PROT 7.3 5.8* 6.2*  --   --   ALBUMIN 3.9 3.2* 3.3* 2.8* 2.6*   No results for input(s): LIPASE, AMYLASE in the last 8760 hours. No results for input(s): AMMONIA in the last 8760 hours. CBC:  Recent Labs  07/21/15 1152 07/22/15 0259 07/23/15 1059  WBC 8.3 7.7 8.5  NEUTROABS 6.6 6.2 6.8  HGB 15.5 14.3 15.9  HCT 45.1 41.3 46.4  MCV 85.9 83.8 86.4  PLT 110* 101* 137*   Cardiac Enzymes:  Recent Labs  09/19/14 2105 07/21/15 2046 07/22/15 0259  TROPONINI 0.14* 0.14* 0.14*   BNP: Invalid input(s): POCBNP CBG:  Recent Labs  07/23/15 2352 07/24/15 0707 07/24/15 1200  GLUCAP 248* 196* 215*     Assessment/Plan   Physical deconditioning Will have him work with physical therapy and occupational therapy team to help with gait training and muscle strengthening  exercises.fall precautions. Skin care. Encourage to be out of bed.   Urinary tract infection  Currently asymptomatic, encouraged hydration. Continue and complete oral Levaquin 750 mg daily until 07/28/15  ckd stage 3 S/p iv fluid in hospital, monitor bmp  Hyponatremia Monitor bmp  Protein calorie malnutrition Get dietary consult, encourage po intake  Cognitive impairment Get SLP consult to assess further. Supportive  care  Hypertension Stable. continue metoprolol tartrate 25 mg bid, imdur 60 mg daily, lisinopril 5 mg daily, check bmp and BP  CAD Chest pain free. Continue imdur, lisinopril, metoprolol and plavix with prn NTG and pravastatin  Dm type 2 Lab Results  Component Value Date   HGBA1C 7.4* 09/19/2014  check a1c. Continue glipizide 5 mg daily, check cbg  GERD Stable with nexium as needed, monitor  BPH Continue flomax with proscar and monitor  Anxiety and depression continue remeron 15 mg daily and Xanax 0.25 mg daily prn and monitor  Constipation Continue colace  Chronic pain  continue oxycodone 5 mg q6h prn pain and monitor for worsening mental state with narcotics    Goals of care: short term rehabilitation   Labs/tests ordered: cbc, cmp, a1c  Family/ staff Communication: reviewed care plan with patient, his wife and nursing supervisor    Blanchie Serve, MD Internal Medicine [place], [place] [address] Cell Phone (Monday-Friday 8 am - 5 pm): [phone] On Call: [phone] and follow prompts after 5 pm and on weekends Office Phone: [phone] Office Fax: [phone]

## 2015-07-30 ENCOUNTER — Non-Acute Institutional Stay (SKILLED_NURSING_FACILITY): Payer: Medicare Other | Admitting: Adult Health

## 2015-07-30 ENCOUNTER — Encounter: Payer: Self-pay | Admitting: Adult Health

## 2015-07-30 DIAGNOSIS — G8929 Other chronic pain: Secondary | ICD-10-CM | POA: Diagnosis not present

## 2015-07-30 DIAGNOSIS — F329 Major depressive disorder, single episode, unspecified: Secondary | ICD-10-CM | POA: Diagnosis not present

## 2015-07-30 DIAGNOSIS — R5381 Other malaise: Secondary | ICD-10-CM

## 2015-07-30 DIAGNOSIS — E46 Unspecified protein-calorie malnutrition: Secondary | ICD-10-CM | POA: Diagnosis not present

## 2015-07-30 DIAGNOSIS — F0390 Unspecified dementia without behavioral disturbance: Secondary | ICD-10-CM

## 2015-07-30 DIAGNOSIS — K219 Gastro-esophageal reflux disease without esophagitis: Secondary | ICD-10-CM

## 2015-07-30 DIAGNOSIS — N3000 Acute cystitis without hematuria: Secondary | ICD-10-CM

## 2015-07-30 DIAGNOSIS — I10 Essential (primary) hypertension: Secondary | ICD-10-CM | POA: Diagnosis not present

## 2015-07-30 DIAGNOSIS — N3281 Overactive bladder: Secondary | ICD-10-CM | POA: Diagnosis not present

## 2015-07-30 DIAGNOSIS — N400 Enlarged prostate without lower urinary tract symptoms: Secondary | ICD-10-CM | POA: Diagnosis not present

## 2015-07-30 DIAGNOSIS — N183 Chronic kidney disease, stage 3 unspecified: Secondary | ICD-10-CM

## 2015-07-30 DIAGNOSIS — E1122 Type 2 diabetes mellitus with diabetic chronic kidney disease: Secondary | ICD-10-CM | POA: Diagnosis not present

## 2015-07-30 DIAGNOSIS — E785 Hyperlipidemia, unspecified: Secondary | ICD-10-CM | POA: Diagnosis not present

## 2015-07-30 DIAGNOSIS — F419 Anxiety disorder, unspecified: Secondary | ICD-10-CM | POA: Diagnosis not present

## 2015-07-30 DIAGNOSIS — K5901 Slow transit constipation: Secondary | ICD-10-CM

## 2015-07-30 DIAGNOSIS — F32A Depression, unspecified: Secondary | ICD-10-CM

## 2015-07-30 DIAGNOSIS — I252 Old myocardial infarction: Secondary | ICD-10-CM

## 2015-07-30 NOTE — Progress Notes (Addendum)
Patient ID: Aaron Stephenson, male   DOB: [birth year], 82 y.o.   MRN: [id number]    DATE:  07/30/2015   MRN:  [id number]  BIRTHDAY: July 17, 1924  Facility:  Nursing Home Location:  [place] [facility] [place] Room Number: 1107-P  LEVEL OF CARE:  SNF (31)  Contact Information    Name Relation Home Work Mobile   Nida,[name] Daughter [phone]     Girvan,Delores Spouse [phone]  [phone]   [place] Daughter   [phone]       Code Status History    Date Active Date Inactive Code Status Order ID Comments User Context   07/21/2015  4:19 PM 07/24/2015  7:09 PM DNR [id number]  Bonnell Public, MD ED   07/21/2015  3:24 PM 07/21/2015  4:19 PM DNR [id number]  Bonnell Public, MD ED   09/19/2014  8:59 AM 09/21/2014  2:41 PM Full Code [id number]  Isaiah Serge, NP Inpatient    Questions for Most Recent Historical Code Status (Order [id number])    Question Answer Comment   In the event of cardiac or respiratory ARREST Do not call a "code blue"    In the event of cardiac or respiratory ARREST Do not perform Intubation, CPR, defibrillation or ACLS    In the event of cardiac or respiratory ARREST Use medication by any route, position, wound care, and other measures to relive pain and suffering. May use oxygen, suction and manual treatment of airway obstruction as needed for comfort.     Advance Directive Documentation        Most Recent Value   Type of Advance Directive  Out of facility DNR (pink MOST or yellow form)   Pre-existing out of facility DNR order (yellow form or pink MOST form)     "MOST" Form in Place?         Chief Complaint  Patient presents with  . Discharge Note    HISTORY OF PRESENT ILLNESS:   This is an 82 year old male who is for discharge home with home health OT for ADLs, PT for endurance, CNA for showers, ST for swallowing, nursing for medical management and social worker for community resources.  He has been admitted to [facility]  on 07/24/15 from [facility]. He has PMH of dementia, polycythemia vera, diabetes mellitus, hypertension, osteoarthritis, hyperlipidemia, Myeloproliferative and lumbar stenosis. He was hospitalized at [facility] from 07/21/15 through 07/24/15. He was treated for volume depletion and urinary tract infection. Urine culture grew enterococcus species. He was given IV fluids and IV Levaquin. He was discharge to [facility] on oral Levaquin.  Patient was admitted to this facility for short-term rehabilitation after the patient's recent hospitalization.  Patient has completed SNF rehabilitation and therapy has cleared the patient for discharge.   PAST MEDICAL HISTORY:  Past Medical History  Diagnosis Date  . Polycythemia vera(238.4) 02/20/2011  . DM (diabetes mellitus), type 2 ([place])   . HTN (hypertension)   . OA (osteoarthritis)   . Hyperlipidemia   . Esophagitis   . GERD (gastroesophageal reflux disease)   . Hiatal hernia   . Migraine   . Vertigo   . Thrombocytopenia ([place])   . Myeloproliferative disease ([place])   . Spinal stenosis, lumbar   . Dementia   . Toxic encephalopathy   . Enterococcus UTI   . Volume depletion   . Acute kidney injury superimposed on chronic kidney disease (HCC)     CKD 3  . Physical deconditioning   .  CKD (chronic kidney disease)   . Acute cystitis without hematuria   . Protein calorie malnutrition ([place])   . Constipation     Unspecified  . BPH (benign prostatic hyperplasia)   . Cognitive impairment      CURRENT MEDICATIONS: Reviewed    Medication List       This list is accurate as of: 07/30/15 11:28 PM.  Always use your most recent med list.               ALPRAZolam 0.25 MG tablet  Commonly known as:  XANAX  Take 1 tablet (0.25 mg total) by mouth daily as needed for anxiety.     atorvastatin 10 MG tablet  Commonly known as:  LIPITOR  Take 10 mg by mouth at bedtime.     clopidogrel 75 MG tablet  Commonly known as:  PLAVIX  Take 1 tablet  (75 mg total) by mouth daily.     docusate sodium 100 MG capsule  Commonly known as:  COLACE  Take 100 mg by mouth daily.     esomeprazole 40 MG capsule  Commonly known as:  NEXIUM  Take 40 mg by mouth daily as needed (for acid reflux or heartburn).     finasteride 5 MG tablet  Commonly known as:  PROSCAR  Take 5 mg by mouth at bedtime.     glipiZIDE 5 MG 24 hr tablet  Commonly known as:  GLUCOTROL XL  Take 5 mg by mouth daily.     isosorbide mononitrate 60 MG 24 hr tablet  Commonly known as:  IMDUR  Take 1 tablet (60 mg total) by mouth daily.     lisinopril 5 MG tablet  Commonly known as:  PRINIVIL,ZESTRIL  Take 5 mg by mouth at bedtime.     metoprolol tartrate 25 MG tablet  Commonly known as:  LOPRESSOR  Take 12.5 mg by mouth 2 (two) times daily.     mirtazapine 15 MG tablet  Commonly known as:  REMERON  Take 15 mg by mouth at bedtime.     multivitamin with minerals tablet  Take 1 tablet by mouth daily.     nitroGLYCERIN 0.4 MG SL tablet  Commonly known as:  NITROSTAT  Place 1 tablet (0.4 mg total) under the tongue every 5 (five) minutes x 3 doses as needed for chest pain.     oxyCODONE 5 MG immediate release tablet  Commonly known as:  Oxy IR/ROXICODONE  Take one tablet by mouth every 6 hours as needed for breakthrough pain     PROCEL Powd  Take 1 scoop by mouth 2 (two) times daily.     tamsulosin 0.4 MG Caps capsule  Commonly known as:  FLOMAX  Take 0.4 mg by mouth daily.     tolterodine 4 MG 24 hr capsule  Commonly known as:  DETROL LA  Take 4 mg by mouth at bedtime.     vitamin C 1000 MG tablet  Take 1,000 mg by mouth daily as needed (immune system support).     vitamin E 400 UNIT capsule  Generic drug:  vitamin E  Take 400 Units by mouth daily as needed (for supplementation).          Allergies  Allergen Reactions  . Penicillins Hives, Swelling and Anaphylaxis    Has patient had a PCN reaction causing immediate rash, facial/tongue/throat  swelling, SOB or lightheadedness with hypotension: unknown Has patient had a PCN reaction causing severe rash involving mucus membranes or skin necrosis:  unknown Has patient had a PCN reaction that required hospitalization: unknown Has patient had a PCN reaction occurring within the last 10 years: unknown If all of the above answers are "NO", then may proceed with Cephalosporin use. Pts family is unable to answer questions.  . Aspirin Other (See Comments)  . Ciprofloxacin Other (See Comments)    Joint swelling  . Macrodantin [Nitrofurantoin Macrocrystal] Other (See Comments)    Chest pains  . Other     "lubriderm pain pill?"  . Oxycodone Other (See Comments)    Pt took OxyContin and tramadol together and almost "died"  . Sulfamethoxazole Other (See Comments)    Unknown - doesn't remember reaction  . Tramadol Other (See Comments)    Blank stare, no expression, becoming addictive  . Unisom [Doxylamine Succinate (Sleep)] Other (See Comments)    Hangover the next day  . Xanax [Alprazolam] Other (See Comments)    loopy     REVIEW OF SYSTEMS:  GENERAL: no change in appetite, no fatigue, no weight changes, no fever, chills or weakness EYES: Denies change in vision, dry eyes, eye pain, itching or discharge EARS: Denies change in hearing, ringing in ears, or earache NOSE: Denies nasal congestion or epistaxis MOUTH and THROAT: Denies oral discomfort, gingival pain or bleeding, pain from teeth or hoarseness   RESPIRATORY: no cough, SOB, DOE, wheezing, hemoptysis CARDIAC: no chest pain, edema or palpitations GI: no abdominal pain, diarrhea, constipation, heart burn, nausea or vomiting GU: Denies dysuria, frequency, hematuria, incontinence, or discharge PSYCHIATRIC: Denies feeling of depression or anxiety. No report of hallucinations, insomnia, paranoia, or agitation    PHYSICAL EXAMINATION  GENERAL APPEARANCE: Well nourished. In no acute distress. Normal body habitus SKIN:  Skin is  warm and dry.  HEAD: Normal in size and contour. No evidence of trauma EYES: Lids open and close normally. No blepharitis, entropion or ectropion. PERRL. Conjunctivae are clear and sclerae are white. Lenses are without opacity EARS: Pinnae are normal. Patient hears normal voice tunes of the examiner MOUTH and THROAT: Lips are without lesions. Oral mucosa is moist and without lesions. Tongue is normal in shape, size, and color and without lesions NECK: supple, trachea midline, no neck masses, no thyroid tenderness, no thyromegaly LYMPHATICS: no LAN in the neck, no supraclavicular LAN RESPIRATORY: breathing is even & unlabored, BS CTAB CARDIAC: RRR, no murmur,no extra heart sounds, no edema GI: abdomen soft, normal BS, no masses, no tenderness, no hepatomegaly, no splenomegaly EXTREMITIES:  Able to move 4 extremities PSYCHIATRIC: Alert to person and disoriented to time and place. Affect and behavior are appropriate  LABS/RADIOLOGY: Labs reviewed: Basic Metabolic Panel:  Recent Labs  09/19/14 1047  07/21/15 1152 07/22/15 0259 07/23/15 1312  NA 140  < > 136 137 134*  K 4.0  < > 3.9 3.9 4.4  CL 104  < > 107 109 107  CO2 27  < > 24 21* 22  GLUCOSE 144*  < > 134* 66 202*  BUN 30*  < > 41* 33* 30*  CREATININE 1.70*  < > 1.85* 1.57* 1.41*  CALCIUM 9.5  < > 9.0 8.6* 8.6*  MG 2.0  --   --   --   --   PHOS  --   --   --  2.6 1.9*  < > = values in this interval not displayed. Liver Function Tests:  Recent Labs  09/18/14 1811 09/19/14 1047 07/21/15 1152 07/22/15 0259 07/23/15 1312  AST 22 20 16   --   --  ALT 16* 14* 12*  --   --   ALKPHOS 49 45 52  --   --   BILITOT 1.0 1.0 1.1  --   --   PROT 7.3 5.8* 6.2*  --   --   ALBUMIN 3.9 3.2* 3.3* 2.8* 2.6*   CBC:  Recent Labs  07/21/15 1152 07/22/15 0259 07/23/15 1059  WBC 8.3 7.7 8.5  NEUTROABS 6.6 6.2 6.8  HGB 15.5 14.3 15.9  HCT 45.1 41.3 46.4  MCV 85.9 83.8 86.4  PLT 110* 101* 137*   Lipid Panel:  Recent Labs   09/19/14 1049  HDL 47   Cardiac Enzymes:  Recent Labs  09/19/14 2105 07/21/15 2046 07/22/15 0259  TROPONINI 0.14* 0.14* 0.14*   CBG:  Recent Labs  07/23/15 2352 07/24/15 0707 07/24/15 1200  GLUCAP 248* 196* 215*       ASSESSMENT/PLAN:  Physical deconditioning -  for Home health PT, OT, Nursing, Social worker, CNA and ST  Urinary tract infection - was given IV Levaquin in the hospital and discharged on oral Levaquin , recently completed  Hypertension - continue metoprolol tartrate 25 mg 1/2 tab = 12.5 mg by mouth twice a day, Lisinopril 5 mg 1 tab by mouth daily at bedtime  Anxiety - continue Xanax 0.25 mg 1 tab by mouth daily when necessary  GERD - continue Nexium 40 mg 1 capsule by mouth daily when necessary  BPH - continue Proscar 5 mg 1 tab by mouth daily at bedtime, Flomax 0.4 mg 1 capsule by mouth daily  Hx of NSTEMI - continue Plavix 75 mg 1 tab by mouth daily and Imdur ER 60 mg 1 tab by mouth daily and NTG when necessary  Constipation - tinea Colace 100 mg 1 capsule by mouth daily  Diabetes mellitus, type II - continue glipizide 5 mg 1 tab by mouth daily  Depression - mood this is stable; continue Remeron 15 mg 1 tab by mouth daily at bedtime  Hyperlipidemia - continue Atorvastatin 10 mg 1 tab by mouth daily at bedtime  Overactive bladder - continue Detrol LA 4 mg 1 capsule by mouth daily at bedtime  Chronic pain - patient was on tramadol for a long time and was recently discontinued; continue oxycodone 5 mg 1 tab by mouth every 6 hours when necessary for pain  Dementia - continue supportive care; fall precaution   Chronic kidney disease, stage III - stable  Lab Results  Component Value Date   CREATININE 1.41* 07/23/2015   Protein calorie malnutrition - albumin 3.3; continue Procel one scoop by mouth twice a day      I have filled out patient's discharge paperwork and written prescriptions.  Patient will receive home health PT, OT, ST,  Nursing, Education officer, museum and CNA.  Total discharge time: Greater than 30 minutes  Discharge time involved coordination of the discharge process with social worker, nursing staff and therapy department. Medical justification for home health services verified.     Durenda Age, NP [employer] [phone]

## 2015-08-01 DIAGNOSIS — R2689 Other abnormalities of gait and mobility: Secondary | ICD-10-CM | POA: Diagnosis not present

## 2015-08-01 DIAGNOSIS — I129 Hypertensive chronic kidney disease with stage 1 through stage 4 chronic kidney disease, or unspecified chronic kidney disease: Secondary | ICD-10-CM | POA: Diagnosis not present

## 2015-08-01 DIAGNOSIS — C946 Myelodysplastic disease, not classified: Secondary | ICD-10-CM | POA: Diagnosis not present

## 2015-08-01 DIAGNOSIS — E785 Hyperlipidemia, unspecified: Secondary | ICD-10-CM | POA: Diagnosis not present

## 2015-08-01 DIAGNOSIS — D45 Polycythemia vera: Secondary | ICD-10-CM | POA: Diagnosis not present

## 2015-08-01 DIAGNOSIS — Z87440 Personal history of urinary (tract) infections: Secondary | ICD-10-CM | POA: Diagnosis not present

## 2015-08-01 DIAGNOSIS — N183 Chronic kidney disease, stage 3 (moderate): Secondary | ICD-10-CM | POA: Diagnosis not present

## 2015-08-01 DIAGNOSIS — Z7984 Long term (current) use of oral hypoglycemic drugs: Secondary | ICD-10-CM | POA: Diagnosis not present

## 2015-08-01 DIAGNOSIS — E1122 Type 2 diabetes mellitus with diabetic chronic kidney disease: Secondary | ICD-10-CM | POA: Diagnosis not present

## 2015-08-01 DIAGNOSIS — D696 Thrombocytopenia, unspecified: Secondary | ICD-10-CM | POA: Diagnosis not present

## 2015-08-01 DIAGNOSIS — M4806 Spinal stenosis, lumbar region: Secondary | ICD-10-CM | POA: Diagnosis not present

## 2015-08-01 DIAGNOSIS — K210 Gastro-esophageal reflux disease with esophagitis: Secondary | ICD-10-CM | POA: Diagnosis not present

## 2015-08-01 DIAGNOSIS — M1990 Unspecified osteoarthritis, unspecified site: Secondary | ICD-10-CM | POA: Diagnosis not present

## 2015-08-01 DIAGNOSIS — Z7902 Long term (current) use of antithrombotics/antiplatelets: Secondary | ICD-10-CM | POA: Diagnosis not present

## 2015-08-01 DIAGNOSIS — F0390 Unspecified dementia without behavioral disturbance: Secondary | ICD-10-CM | POA: Diagnosis not present

## 2015-08-03 DIAGNOSIS — M4806 Spinal stenosis, lumbar region: Secondary | ICD-10-CM | POA: Diagnosis not present

## 2015-08-03 DIAGNOSIS — E1122 Type 2 diabetes mellitus with diabetic chronic kidney disease: Secondary | ICD-10-CM | POA: Diagnosis not present

## 2015-08-03 DIAGNOSIS — F0390 Unspecified dementia without behavioral disturbance: Secondary | ICD-10-CM | POA: Diagnosis not present

## 2015-08-03 DIAGNOSIS — I129 Hypertensive chronic kidney disease with stage 1 through stage 4 chronic kidney disease, or unspecified chronic kidney disease: Secondary | ICD-10-CM | POA: Diagnosis not present

## 2015-08-03 DIAGNOSIS — R2689 Other abnormalities of gait and mobility: Secondary | ICD-10-CM | POA: Diagnosis not present

## 2015-08-03 DIAGNOSIS — N183 Chronic kidney disease, stage 3 (moderate): Secondary | ICD-10-CM | POA: Diagnosis not present

## 2015-08-04 DIAGNOSIS — M4806 Spinal stenosis, lumbar region: Secondary | ICD-10-CM | POA: Diagnosis not present

## 2015-08-04 DIAGNOSIS — I129 Hypertensive chronic kidney disease with stage 1 through stage 4 chronic kidney disease, or unspecified chronic kidney disease: Secondary | ICD-10-CM | POA: Diagnosis not present

## 2015-08-04 DIAGNOSIS — E1122 Type 2 diabetes mellitus with diabetic chronic kidney disease: Secondary | ICD-10-CM | POA: Diagnosis not present

## 2015-08-04 DIAGNOSIS — N183 Chronic kidney disease, stage 3 (moderate): Secondary | ICD-10-CM | POA: Diagnosis not present

## 2015-08-04 DIAGNOSIS — F0390 Unspecified dementia without behavioral disturbance: Secondary | ICD-10-CM | POA: Diagnosis not present

## 2015-08-04 DIAGNOSIS — R2689 Other abnormalities of gait and mobility: Secondary | ICD-10-CM | POA: Diagnosis not present

## 2015-08-09 DIAGNOSIS — N281 Cyst of kidney, acquired: Secondary | ICD-10-CM | POA: Diagnosis not present

## 2015-08-09 DIAGNOSIS — N183 Chronic kidney disease, stage 3 (moderate): Secondary | ICD-10-CM | POA: Diagnosis not present

## 2015-08-09 DIAGNOSIS — R339 Retention of urine, unspecified: Secondary | ICD-10-CM | POA: Diagnosis not present

## 2015-08-09 DIAGNOSIS — C61 Malignant neoplasm of prostate: Secondary | ICD-10-CM | POA: Diagnosis not present

## 2015-08-10 DIAGNOSIS — I10 Essential (primary) hypertension: Secondary | ICD-10-CM | POA: Diagnosis not present

## 2015-08-10 DIAGNOSIS — N183 Chronic kidney disease, stage 3 (moderate): Secondary | ICD-10-CM | POA: Diagnosis not present

## 2015-08-10 DIAGNOSIS — I129 Hypertensive chronic kidney disease with stage 1 through stage 4 chronic kidney disease, or unspecified chronic kidney disease: Secondary | ICD-10-CM | POA: Diagnosis not present

## 2015-08-10 DIAGNOSIS — N400 Enlarged prostate without lower urinary tract symptoms: Secondary | ICD-10-CM | POA: Diagnosis not present

## 2015-08-10 DIAGNOSIS — M4806 Spinal stenosis, lumbar region: Secondary | ICD-10-CM | POA: Diagnosis not present

## 2015-08-10 DIAGNOSIS — Z7984 Long term (current) use of oral hypoglycemic drugs: Secondary | ICD-10-CM | POA: Diagnosis not present

## 2015-08-10 DIAGNOSIS — D45 Polycythemia vera: Secondary | ICD-10-CM | POA: Diagnosis not present

## 2015-08-10 DIAGNOSIS — N189 Chronic kidney disease, unspecified: Secondary | ICD-10-CM | POA: Diagnosis not present

## 2015-08-10 DIAGNOSIS — K219 Gastro-esophageal reflux disease without esophagitis: Secondary | ICD-10-CM | POA: Diagnosis not present

## 2015-08-10 DIAGNOSIS — E869 Volume depletion, unspecified: Secondary | ICD-10-CM | POA: Diagnosis not present

## 2015-08-10 DIAGNOSIS — R2689 Other abnormalities of gait and mobility: Secondary | ICD-10-CM | POA: Diagnosis not present

## 2015-08-10 DIAGNOSIS — E785 Hyperlipidemia, unspecified: Secondary | ICD-10-CM | POA: Diagnosis not present

## 2015-08-10 DIAGNOSIS — F0390 Unspecified dementia without behavioral disturbance: Secondary | ICD-10-CM | POA: Diagnosis not present

## 2015-08-10 DIAGNOSIS — E1122 Type 2 diabetes mellitus with diabetic chronic kidney disease: Secondary | ICD-10-CM | POA: Diagnosis not present

## 2015-08-11 DIAGNOSIS — I10 Essential (primary) hypertension: Secondary | ICD-10-CM | POA: Diagnosis not present

## 2015-08-16 ENCOUNTER — Other Ambulatory Visit: Payer: Self-pay | Admitting: Nurse Practitioner

## 2015-08-20 DIAGNOSIS — E668 Other obesity: Secondary | ICD-10-CM | POA: Diagnosis not present

## 2015-08-20 DIAGNOSIS — I2510 Atherosclerotic heart disease of native coronary artery without angina pectoris: Secondary | ICD-10-CM | POA: Diagnosis not present

## 2015-08-20 DIAGNOSIS — E1121 Type 2 diabetes mellitus with diabetic nephropathy: Secondary | ICD-10-CM | POA: Diagnosis not present

## 2015-08-20 DIAGNOSIS — N183 Chronic kidney disease, stage 3 (moderate): Secondary | ICD-10-CM | POA: Diagnosis not present

## 2015-08-20 DIAGNOSIS — D45 Polycythemia vera: Secondary | ICD-10-CM | POA: Diagnosis not present

## 2015-08-20 DIAGNOSIS — I119 Hypertensive heart disease without heart failure: Secondary | ICD-10-CM | POA: Diagnosis not present

## 2015-08-20 DIAGNOSIS — E784 Other hyperlipidemia: Secondary | ICD-10-CM | POA: Diagnosis not present

## 2015-08-20 DIAGNOSIS — K219 Gastro-esophageal reflux disease without esophagitis: Secondary | ICD-10-CM | POA: Diagnosis not present

## 2015-08-21 ENCOUNTER — Other Ambulatory Visit: Payer: Self-pay | Admitting: Internal Medicine

## 2015-08-24 DIAGNOSIS — M170 Bilateral primary osteoarthritis of knee: Secondary | ICD-10-CM | POA: Diagnosis not present

## 2015-08-24 DIAGNOSIS — M25461 Effusion, right knee: Secondary | ICD-10-CM | POA: Diagnosis not present

## 2015-09-20 ENCOUNTER — Other Ambulatory Visit: Payer: Self-pay | Admitting: Nurse Practitioner

## 2015-09-22 ENCOUNTER — Other Ambulatory Visit: Payer: Self-pay | Admitting: Nurse Practitioner

## 2015-09-27 DIAGNOSIS — L570 Actinic keratosis: Secondary | ICD-10-CM | POA: Diagnosis not present

## 2015-09-27 DIAGNOSIS — D485 Neoplasm of uncertain behavior of skin: Secondary | ICD-10-CM | POA: Diagnosis not present

## 2015-09-27 DIAGNOSIS — C4442 Squamous cell carcinoma of skin of scalp and neck: Secondary | ICD-10-CM | POA: Diagnosis not present

## 2015-09-30 DIAGNOSIS — M170 Bilateral primary osteoarthritis of knee: Secondary | ICD-10-CM | POA: Diagnosis not present

## 2015-10-08 ENCOUNTER — Other Ambulatory Visit: Payer: Self-pay | Admitting: Nurse Practitioner

## 2015-10-25 DIAGNOSIS — Z23 Encounter for immunization: Secondary | ICD-10-CM | POA: Diagnosis not present

## 2015-10-29 DIAGNOSIS — N183 Chronic kidney disease, stage 3 (moderate): Secondary | ICD-10-CM | POA: Diagnosis not present

## 2015-10-29 DIAGNOSIS — E611 Iron deficiency: Secondary | ICD-10-CM | POA: Diagnosis not present

## 2015-11-02 DIAGNOSIS — M25461 Effusion, right knee: Secondary | ICD-10-CM | POA: Diagnosis not present

## 2015-11-02 DIAGNOSIS — M170 Bilateral primary osteoarthritis of knee: Secondary | ICD-10-CM | POA: Diagnosis not present

## 2015-12-16 ENCOUNTER — Ambulatory Visit (INDEPENDENT_AMBULATORY_CARE_PROVIDER_SITE_OTHER): Payer: Medicare Other | Admitting: Sports Medicine

## 2015-12-16 ENCOUNTER — Encounter (INDEPENDENT_AMBULATORY_CARE_PROVIDER_SITE_OTHER): Payer: Self-pay | Admitting: Sports Medicine

## 2015-12-16 ENCOUNTER — Encounter (INDEPENDENT_AMBULATORY_CARE_PROVIDER_SITE_OTHER): Payer: Self-pay

## 2015-12-16 VITALS — BP 112/64 | HR 66 | Ht 62.0 in | Wt 150.0 lb

## 2015-12-16 DIAGNOSIS — M170 Bilateral primary osteoarthritis of knee: Secondary | ICD-10-CM

## 2015-12-16 DIAGNOSIS — M25461 Effusion, right knee: Secondary | ICD-10-CM

## 2015-12-16 DIAGNOSIS — M25462 Effusion, left knee: Secondary | ICD-10-CM | POA: Diagnosis not present

## 2015-12-16 DIAGNOSIS — I25119 Atherosclerotic heart disease of native coronary artery with unspecified angina pectoris: Secondary | ICD-10-CM | POA: Diagnosis not present

## 2015-12-16 MED ORDER — BUPIVACAINE HCL 0.5 % IJ SOLN
2.0000 mL | INTRAMUSCULAR | Status: AC | PRN
  Administered 2015-12-16: 2 mL via INTRA_ARTICULAR

## 2015-12-16 MED ORDER — METHYLPREDNISOLONE ACETATE 40 MG/ML IJ SUSP
40.0000 mg | INTRAMUSCULAR | Status: AC | PRN
  Administered 2015-12-16: 40 mg via INTRA_ARTICULAR

## 2015-12-16 NOTE — Progress Notes (Signed)
Aaron Stephenson - 82 y.o. male MRN [id number]  Date of birth: [birth year]  Office Visit Note: Visit Date: 12/16/2015 PCP: Aaron Crutch, MD Referred by: Aaron Cruel, MD  Subjective: Chief Complaint  Patient presents with  . Left Knee - Pain  . Right Knee - Pain  . Follow-up   HPI: Ambulates with a cane.  Patient states bilateral knee pain, but the right is worse than the left.  Would like fluid drawn off of the right knee if possible?  Patient with recurrent bilateral knee pain & discomfort. He's had issues with this for quite some time. He cannot recall exactly him at times we have injected him but upon chart review is been over 6 times in the past year of either viscosupplementation, Toradol or corticosteroid. Last corticosteroid injection was 3 months ago. Requesting repeat injections bilaterally. Denies any recent falls. Lower cherry swelling is worse when legs are more swollen & is more noticeable today on the right but is also quite bothersome on the left.    ROS Otherwise per HPI.  Assessment & Plan: Visit Diagnoses:  1. Bilateral primary osteoarthritis of knee   2. Effusion, left knee   3. Effusion, right knee     Plan: Findings:  Bilateral aspiration injections per procedure note. Unfortunately he is having some worsening cognition. He has been having increasing difficulty with his knees since being started on Plavix which seems to be causing small amount of hemarthrosis. Patient is a candidate for total knee arthroplasty but is hesitant to pursue this at this time due to the significant risk. He does understand however this is a possibility for him. We did discuss that we cannot reinject his knees prior to 3 months.  I am happy to follow up with this patient at my new location ([place] [facility]) for their chronic ongoing issues.      Meds & Orders: No orders of the defined types were placed in this encounter.   Orders Placed  This Encounter  Procedures  . Large Joint Injection/Arthrocentesis  . Large Joint Injection/Arthrocentesis  . US Guided Needle Placement    Follow-up: Return in about 3 months (around 03/17/2016) for we will call to set up an appointment at my new locatoin.   Procedures: Ultrasound guided right knee aspiration & injection Date/Time: 12/16/2015 9:35 AM Performed by: Aaron Stephenson Authorized by: Aaron Stephenson   Indications:  Pain and joint swelling Location:  Knee Site:  R knee Needle Size:  18 G Needle Length:  1.5 inches Approach:  Superolateral Ultrasound Guidance: Yes   Fluoroscopic Guidance: No   Arthrogram: No Medications:  2 mL bupivacaine 0.5 %; 40 mg methylPREDNISolone acetate 40 MG/ML Aspiration Attempted: Yes   Aspirate amount (mL):  50 Aspirate:  Serous and blood-tinged Comments: After informed consent was obtained & all questions were answered the target sight was prepped in sterile fashion using alcohol, ethel chloride and sterile ultrasound technique. Subsequently using a 25g needle, 16mL of 1% lidocaine was used for local anesthetic via the above approach. Under real-time ultrasound guidance,  using an 18g needle, aspirate was obtained as above. Subsequently, using sterile stopcock technique, injection of the above medications was performed without difficulty. Band-Aid and 6 inch Ace wrap was applied. Patient tolerated this procedure well with no immediate complications.   Ultrasound-guided left knee aspiration & injection Date/Time: 12/16/2015 9:35 AM Performed by: Aaron Stephenson Authorized by: Aaron Stephenson   Indications:  Pain and joint swelling Location:  Knee Site:  L knee Needle Size:  18 G Needle Length:  1.5 inches Approach:  Superolateral Ultrasound Guidance: Yes   Fluoroscopic Guidance: No   Arthrogram: No Medications:  2 mL bupivacaine 0.5 %; 40 mg methylPREDNISolone acetate 40 MG/ML Aspiration Attempted: Yes   Aspirate amount (mL):   30 Aspirate:  Yellow and serous Comments: After informed consent was obtained & all questions were answered the target sight was prepped in sterile fashion using alcohol, ethel chloride and sterile ultrasound technique. Subsequently using a 25g needle, 64mL of 1% lidocaine was used for local anesthetic via the above approach. Under real-time ultrasound guidance,  using an 18g needle, aspirate was obtained as above. Subsequently, using sterile stopcock technique, injection of the above medications was performed without difficulty. Band-Aid and 6 inch Ace wrap was applied. Patient tolerated this procedure well with no immediate complications.      No notes on file   Clinical History: No specialty comments available.  He reports that he has never smoked. He has never used smokeless tobacco. No results for input(s): HGBA1C, LABURIC in the last 8760 hours.  Objective:  VS:  HT:5\' 2"  (157.5 cm)   WT:150 lb (68 kg)  BMI:27.5    BP:112/64  HR:66bpm  TEMP: ( )  RESP:  Physical Exam  Constitutional:  Adult male in no acute respiratory distress. He is alert & appropriately interactive although his short-term & long-term recall is significantly worse than prior episodes. He is unable to recall how many times we have injected him. He has increased difficulty with sit-to-stand today even after injections were taken greater than 3-4 seconds. He walks with a cane. Bilateral knees have a moderate effusion on the right & small to moderate effusion on the left. Significant posterior arthritic bossing. Positive crepitation with patellar grind. He has 3-4 mm of opening with varus & valgus stressing. He does have lower extremity edema is 2+/4 today which is improved. This does seem to be lower than the level of the compression sleeves.    Ortho Exam Imaging: US Guided Needle Placement  Result Date: 12/16/2015 Ultrasound was used for real-time image guided aspiration & injection. Please see procedure note for  further details.   Past Medical/Family/Surgical/Social History: Medications & Allergies reviewed per EMR Patient Active Problem List   Diagnosis Date Noted  . GERD (gastroesophageal reflux disease) 07/26/2015  . Toxic encephalopathy 07/21/2015  . Kidney disease, chronic, stage III (GFR 30-59 ml/min) 09/21/2014  . Abnormal cardiovascular function study 09/21/2014  . BPH (benign prostatic hypertrophy) 09/21/2014  . Hyperlipidemia   . Chronic venous insufficiency   . NSTEMI (non-ST elevated myocardial infarction) ([place]) 09/19/2014  . Type 2 diabetes mellitus with renal manifestations ([place]) 09/19/2014  . Polycythemia vera ([place]) 02/20/2011   Past Medical History:  Diagnosis Date  . Acute cystitis without hematuria   . Acute kidney injury superimposed on chronic kidney disease (HCC)    CKD 3  . BPH (benign prostatic hyperplasia)   . CKD (chronic kidney disease)   . Cognitive impairment   . Constipation    Unspecified  . Dementia   . DM (diabetes mellitus), type 2 ([place])   . Enterococcus UTI   . Esophagitis   . GERD (gastroesophageal reflux disease)   . Hiatal hernia   . HTN (hypertension)   . Hyperlipidemia   . Migraine   . Myeloproliferative disease ([place])   . OA (osteoarthritis)   . Physical deconditioning   . Polycythemia  [id number]) 02/20/2011  . Protein calorie malnutrition ([place])   . Spinal stenosis, lumbar   . Thrombocytopenia ([place])   . Toxic encephalopathy   . Vertigo   . Volume depletion    Family History  Problem Relation Age of Onset  . Diabetes Mother   . Hypertension Mother   . Cancer Father   . Diabetes Brother   . Kidney failure Brother    Past Surgical History:  Procedure Laterality Date  . LOWER LEG SOFT TISSUE TUMOR EXCISION    . TOTAL HIP ARTHROPLASTY  1993   right   Social History   Occupational History  . Not on file.   Social History Main Topics  . Smoking status: Never Smoker  . Smokeless tobacco: Never Used  . Alcohol use Yes      Comment: rarely  . Drug use: No  . Sexual activity: No

## 2015-12-17 ENCOUNTER — Inpatient Hospital Stay (INDEPENDENT_AMBULATORY_CARE_PROVIDER_SITE_OTHER): Payer: Self-pay

## 2016-01-14 DIAGNOSIS — D45 Polycythemia vera: Secondary | ICD-10-CM | POA: Diagnosis not present

## 2016-01-14 DIAGNOSIS — E785 Hyperlipidemia, unspecified: Secondary | ICD-10-CM | POA: Diagnosis not present

## 2016-01-14 DIAGNOSIS — F0390 Unspecified dementia without behavioral disturbance: Secondary | ICD-10-CM | POA: Diagnosis not present

## 2016-01-14 DIAGNOSIS — N400 Enlarged prostate without lower urinary tract symptoms: Secondary | ICD-10-CM | POA: Diagnosis not present

## 2016-01-14 DIAGNOSIS — Z7984 Long term (current) use of oral hypoglycemic drugs: Secondary | ICD-10-CM | POA: Diagnosis not present

## 2016-01-14 DIAGNOSIS — N189 Chronic kidney disease, unspecified: Secondary | ICD-10-CM | POA: Diagnosis not present

## 2016-01-14 DIAGNOSIS — I10 Essential (primary) hypertension: Secondary | ICD-10-CM | POA: Diagnosis not present

## 2016-01-14 DIAGNOSIS — E1122 Type 2 diabetes mellitus with diabetic chronic kidney disease: Secondary | ICD-10-CM | POA: Diagnosis not present

## 2016-01-14 DIAGNOSIS — K219 Gastro-esophageal reflux disease without esophagitis: Secondary | ICD-10-CM | POA: Diagnosis not present

## 2016-01-14 DIAGNOSIS — E869 Volume depletion, unspecified: Secondary | ICD-10-CM | POA: Diagnosis not present

## 2016-02-18 ENCOUNTER — Telehealth: Payer: Self-pay | Admitting: *Deleted

## 2016-02-18 NOTE — Telephone Encounter (Signed)
duplicate

## 2016-02-18 NOTE — Telephone Encounter (Signed)
Pt should follow up with Dr. Sharol Given at [facility] if it is this severe.  It has only been 2 months since this was last performed and is to early to repeat the cortisone injections.  Could consider aspiration and toradol injections but he should also consider TKA.  I have forwarded to Dr. Jess Barters assistant [place] and will ask that they help coordinate a follow up if Mr. Aaron Stephenson would like

## 2016-02-18 NOTE — Telephone Encounter (Signed)
Attempted to contact patient to let him know that someone would be calling him to schedule an appointment.  No answer and voice mailbox was turned off.     Turning this over to Dr. Jess Barters assist.

## 2016-02-18 NOTE — Telephone Encounter (Signed)
Patient is calling saying that he is trying to reach Dr. Nicolasa Ducking office.   He is having bad swelling and pain in his left knee. He said that he has had fluid drawn off periodically, but he really feels like it needs fluid drawn off again and he does not know who to go to to have this done.     Message routed to Dr. Paulla Fore to advise.

## 2016-02-21 NOTE — Telephone Encounter (Signed)
Called and pt has an appt on Thursday at 1pm for knee pain eval.

## 2016-02-23 ENCOUNTER — Ambulatory Visit (INDEPENDENT_AMBULATORY_CARE_PROVIDER_SITE_OTHER): Payer: Medicare Other | Admitting: Family

## 2016-02-23 ENCOUNTER — Encounter (INDEPENDENT_AMBULATORY_CARE_PROVIDER_SITE_OTHER): Payer: Self-pay | Admitting: Family

## 2016-02-23 VITALS — Ht 62.0 in | Wt 150.0 lb

## 2016-02-23 DIAGNOSIS — M1712 Unilateral primary osteoarthritis, left knee: Secondary | ICD-10-CM

## 2016-02-23 DIAGNOSIS — M1711 Unilateral primary osteoarthritis, right knee: Secondary | ICD-10-CM

## 2016-02-23 NOTE — Progress Notes (Signed)
Office Visit Note   Patient: Aaron Stephenson           Date of Birth: 05-28-1924           MRN: [id number] Visit Date: 02/23/2016              Requested by: Lawerance Cruel, MD [place], [place] [id number] PCP: Melinda Crutch, MD  Chief Complaint  Patient presents with  . Right Knee - Follow-up  . Left Knee - Follow-up    HPI: Patient presents today for follow up for bilateral knee pain. He states he feels about the same. He had relief from aspirations and injections in the past, but his symptoms do return several weeks later. He states his family has encouraged his about the possibility of total joint replacement. Right now the patient is fairly independent and drives on his own. He is hesitant about surgical intervention of fear from losing this independence. Maxcine Ham, RT  The patient is an 83 year old gentleman who presents today for evaluation of bilateral knee pain. Has history of bilateral osteoarthritis. Has been getting great interval relief with steroid injections. Last injection was about 5 months ago. States just recently had return of pain and mechanical symptoms. States I feel miserable.  Has been considering total joint replacement. However feels that at his age this is not a good option.    Assessment & Plan: Visit Diagnoses:  1. Primary osteoarthritis of left knee   2. Primary osteoarthritis of right knee     Plan: Lateral injections today. Discussed risk and benefits of total joint replacement. Deferred at this time. Patient will follow-up in office as needed.  Follow-Up Instructions: Return if symptoms worsen or fail to improve.   Exam: Patient is alert and oriented. No adenopathy. Well-dressed. Normal affect. Respirations easy.   Right Knee Exam   Tenderness  The patient is experiencing tenderness in the medial joint line and lateral joint line.  Muscle Strength   The patient has normal right knee strength.  Tests  Varus:  negative    Other  Swelling: none   Left Knee Exam   Tenderness  The patient is experiencing tenderness in the lateral joint line and medial joint line.  Muscle Strength   The patient has normal left knee strength.  Tests  Varus: negative    Other  Swelling: none       Imaging: No results found.  Orders:  Orders Placed This Encounter  Procedures  . Large Joint Injection/Arthrocentesis  . Large Joint Injection/Arthrocentesis   No orders of the defined types were placed in this encounter.    Procedures: Large Joint Inj Date/Time: 02/28/2016 7:54 PM Performed by: Suzan Slick Authorized by: Dondra Prader R   Consent Given by:  Patient Site marked: the procedure site was marked   Timeout: prior to procedure the correct patient, procedure, and site was verified   Indications:  Pain and diagnostic evaluation Location:  Knee Site:  R knee Needle Size:  22 G Needle Length:  1.5 inches Ultrasound Guidance: No   Fluoroscopic Guidance: No   Arthrogram: No   Medications:  5 mL lidocaine 1 %; 40 mg methylPREDNISolone acetate 40 MG/ML Aspiration Attempted: No   Patient tolerance:  Patient tolerated the procedure well with no immediate complications Large Joint Inj Date/Time: 02/28/2016 7:54 PM Performed by: Suzan Slick Authorized by: Dondra Prader R   Consent Given by:  Patient Site marked: the procedure site was  marked   Timeout: prior to procedure the correct patient, procedure, and site was verified   Indications:  Pain and diagnostic evaluation Location:  Knee Site:  L knee Needle Size:  22 G Needle Length:  1.5 inches Ultrasound Guidance: No   Fluoroscopic Guidance: No   Arthrogram: No   Medications:  5 mL lidocaine 1 %; 40 mg methylPREDNISolone acetate 40 MG/ML Aspiration Attempted: No   Patient tolerance:  Patient tolerated the procedure well with no immediate complications    Clinical Data: No additional findings.  Subjective: Review  of Systems  Constitutional: Negative for chills and fever.  Musculoskeletal: Positive for arthralgias and gait problem. Negative for joint swelling.    Objective: Vital Signs: Ht 5\' 2"  (1.575 m)   Wt 150 lb (68 kg)   BMI 27.44 kg/m   Specialty Comments:  No specialty comments available.  PMFS History: Patient Active Problem List   Diagnosis Date Noted  . Primary osteoarthritis of left knee 02/28/2016  . Primary osteoarthritis of right knee 02/28/2016  . GERD (gastroesophageal reflux disease) 07/26/2015  . Toxic encephalopathy 07/21/2015  . Kidney disease, chronic, stage III (GFR 30-59 ml/min) 09/21/2014  . Abnormal cardiovascular function study 09/21/2014  . BPH (benign prostatic hypertrophy) 09/21/2014  . Hyperlipidemia   . Chronic venous insufficiency   . NSTEMI (non-ST elevated myocardial infarction) ([place]) 09/19/2014  . Type 2 diabetes mellitus with renal manifestations ([place]) 09/19/2014  . Polycythemia vera ([place]) 02/20/2011   Past Medical History:  Diagnosis Date  . Acute cystitis without hematuria   . Acute kidney injury superimposed on chronic kidney disease (HCC)    CKD 3  . BPH (benign prostatic hyperplasia)   . CKD (chronic kidney disease)   . Cognitive impairment   . Constipation    Unspecified  . Dementia   . DM (diabetes mellitus), type 2 ([place])   . Enterococcus UTI   . Esophagitis   . GERD (gastroesophageal reflux disease)   . Hiatal hernia   . HTN (hypertension)   . Hyperlipidemia   . Migraine   . Myeloproliferative disease ([place])   . OA (osteoarthritis)   . Physical deconditioning   . Polycythemia vera(238.4) 02/20/2011  . Protein calorie malnutrition ([place])   . Spinal stenosis, lumbar   . Thrombocytopenia ([place])   . Toxic encephalopathy   . Vertigo   . Volume depletion     Family History  Problem Relation Age of Onset  . Diabetes Mother   . Hypertension Mother   . Cancer Father   . Diabetes Brother   . Kidney failure Brother     Past  Surgical History:  Procedure Laterality Date  . LOWER LEG SOFT TISSUE TUMOR EXCISION    . TOTAL HIP ARTHROPLASTY  1993   right   Social History   Occupational History  . Not on file.   Social History Main Topics  . Smoking status: Never Smoker  . Smokeless tobacco: Never Used  . Alcohol use Yes     Comment: rarely  . Drug use: No  . Sexual activity: No

## 2016-02-24 ENCOUNTER — Ambulatory Visit (INDEPENDENT_AMBULATORY_CARE_PROVIDER_SITE_OTHER): Payer: Medicare Other | Admitting: Orthopedic Surgery

## 2016-02-25 DIAGNOSIS — E668 Other obesity: Secondary | ICD-10-CM | POA: Diagnosis not present

## 2016-02-25 DIAGNOSIS — K219 Gastro-esophageal reflux disease without esophagitis: Secondary | ICD-10-CM | POA: Diagnosis not present

## 2016-02-25 DIAGNOSIS — E1121 Type 2 diabetes mellitus with diabetic nephropathy: Secondary | ICD-10-CM | POA: Diagnosis not present

## 2016-02-25 DIAGNOSIS — N183 Chronic kidney disease, stage 3 (moderate): Secondary | ICD-10-CM | POA: Diagnosis not present

## 2016-02-25 DIAGNOSIS — E784 Other hyperlipidemia: Secondary | ICD-10-CM | POA: Diagnosis not present

## 2016-02-25 DIAGNOSIS — I2510 Atherosclerotic heart disease of native coronary artery without angina pectoris: Secondary | ICD-10-CM | POA: Diagnosis not present

## 2016-02-25 DIAGNOSIS — I119 Hypertensive heart disease without heart failure: Secondary | ICD-10-CM | POA: Diagnosis not present

## 2016-02-25 DIAGNOSIS — D45 Polycythemia vera: Secondary | ICD-10-CM | POA: Diagnosis not present

## 2016-02-28 DIAGNOSIS — M1711 Unilateral primary osteoarthritis, right knee: Secondary | ICD-10-CM | POA: Diagnosis not present

## 2016-02-28 DIAGNOSIS — M1712 Unilateral primary osteoarthritis, left knee: Secondary | ICD-10-CM | POA: Diagnosis not present

## 2016-02-28 MED ORDER — METHYLPREDNISOLONE ACETATE 40 MG/ML IJ SUSP
40.0000 mg | INTRAMUSCULAR | Status: AC | PRN
  Administered 2016-02-28: 40 mg via INTRA_ARTICULAR

## 2016-02-28 MED ORDER — LIDOCAINE HCL 1 % IJ SOLN
5.0000 mL | INTRAMUSCULAR | Status: AC | PRN
  Administered 2016-02-28: 5 mL

## 2016-04-21 DIAGNOSIS — C61 Malignant neoplasm of prostate: Secondary | ICD-10-CM | POA: Diagnosis not present

## 2016-04-21 DIAGNOSIS — N183 Chronic kidney disease, stage 3 (moderate): Secondary | ICD-10-CM | POA: Diagnosis not present

## 2016-04-21 DIAGNOSIS — R339 Retention of urine, unspecified: Secondary | ICD-10-CM | POA: Diagnosis not present

## 2016-04-21 DIAGNOSIS — N281 Cyst of kidney, acquired: Secondary | ICD-10-CM | POA: Diagnosis not present

## 2016-04-24 ENCOUNTER — Ambulatory Visit (INDEPENDENT_AMBULATORY_CARE_PROVIDER_SITE_OTHER): Payer: Medicare Other | Admitting: Orthopedic Surgery

## 2016-05-01 ENCOUNTER — Encounter: Payer: Self-pay | Admitting: Sports Medicine

## 2016-05-01 ENCOUNTER — Ambulatory Visit (INDEPENDENT_AMBULATORY_CARE_PROVIDER_SITE_OTHER): Payer: Medicare Other | Admitting: Sports Medicine

## 2016-05-01 ENCOUNTER — Ambulatory Visit: Payer: Self-pay

## 2016-05-01 VITALS — BP 125/82 | HR 61 | Ht 60.5 in | Wt 161.0 lb

## 2016-05-01 DIAGNOSIS — M25462 Effusion, left knee: Secondary | ICD-10-CM

## 2016-05-01 DIAGNOSIS — M170 Bilateral primary osteoarthritis of knee: Secondary | ICD-10-CM | POA: Diagnosis not present

## 2016-05-01 DIAGNOSIS — M25461 Effusion, right knee: Secondary | ICD-10-CM | POA: Diagnosis not present

## 2016-05-01 NOTE — Progress Notes (Signed)
Completed verification process and emailed consultant to interpret results of cost and coverage for patient. Will notify patient upon consultants advice to me and order supplies if desired.

## 2016-05-01 NOTE — Progress Notes (Signed)
Aaron Stephenson - 83 y.o. male MRN [id number]  Date of birth: 05-Aug-1924  Office Visit Note: Visit Date: 05/01/2016 PCP: Melinda Crutch, MD Referred by: Lawerance Cruel, MD  Subjective: Chief Complaint  Patient presents with  . pain in knees    bilateral   HPI: Patient is here with chronic ongoing bilateral knee pain and difficulty walking.  He is not interested in total knee arthroplasty at this time.  He is requesting repeat injections.  He is previously responded well to these although only temporary relief.  He has tried MONOVISC injection 1.  no other Visco supplementation he is interested in possible Orthovisc series.  He continues to use Body Helix with good improvement in his symptoms.ROS:   Otherwise per HPI.  Objective:  VS:  HT:5' 0.5" (153.7 cm)   WT:161 lb (73 kg)  BMI:31    BP:125/82  HR:61bpm  TEMP: ( )  RESP:95 % Physical Exam: GENERAL:  WDWN, NAD, Non-toxic appearing PSYCH:  Alert & appropriately interactive  Not depressed or anxious appearing   Imaging & Procedures: No results found. PROCEDURE NOTE: ULTRASOUND GUIDED BILATERAL KNEE ASPIRATION &INJECTIONS  Images were obtained and interpreted by myself, Teresa Coombs, DO  Images have been saved and stored to PACS system. Images obtained on: GE S7 Ultrasound machine  DESCRIPTION OF PROCEDURE:  The patient's clinical condition is marked by substantial pain and/or significant functional disability. Other conservative therapy has not provided relief, is contraindicated, or not appropriate. There is a reasonable likelihood that injection will significantly improve the patient's pain and/or functional impairment. After discussing the risks, benefits and expected outcomes of the injection and all questions were reviewed and answered, the patient wished to undergo the above named procedure. Verbal consent was obtained. The ultrasound was used to identify the target structure and adjacent neurovascular  structures. The skin was then prepped in sterile fashion and the target structure was injected under direct visualization using sterile technique as below: RIGHT KNEE: PREP: Alcohol, Ethel Chloride, 5cc 1% lidocaine on 25 needle APPROACH: Superiolateral,Stopcock technique, 18 g 1 .5" needle INJECTATE: 2 cc 0.5% marcaine, 1 cc 40mg  DepoMedrol ASPIRATE: 45cc bloody aspirate  DRESSING: Band-Aid & Pts own body helix compression sleeve  LEFT KNEE: REP: Alcohol, Ethel Chloride, 5cc 1% lidocaine on 25 needle APPROACH: Superiolateral,Stopcock technique, 18 g 1 .5" needle INJECTATE: 2 cc 0.5% marcaine, 1 cc 40mg  DepoMedrol ASPIRATE: 25 serous fluid DRESSING: Band-Aid & Pts own body helix compression sleeve  Post procedural instructions including recommending icing and warning signs for infection were reviewed. This procedure was well tolerated and there were no complications.   IMPRESSION: Succesful US Guided Aspiration & injection of bilateral knees  Assessment & Plan: Problem List Items Addressed This Visit    Bilateral knee effusions   Relevant Orders   US Guided Needle Placement   Primary osteoarthritis of both knees - Primary    Bilateral knee aspiration injection today. Given the recurrent effusions that he has with how long this been going on I would like to set him up for a trial of serial Visco supplementation with aspirations weekly.  We will go ahead and get him preapproved for Orthovisc and plan to have him follow-up in 8 weeks to begin the series.  We did discuss this would be 3 weeks in a row of shots 1 week apart.      Relevant Orders   US Guided Needle Placement      Follow-up: Return in about 8 weeks (around  06/26/2016) for For Visco supplementation series.   Past Medical/Family/Surgical/Social History: Medications & Allergies reviewed per EMR Patient Active Problem List   Diagnosis Date Noted  . Bilateral knee effusions 05/01/2016  . Primary osteoarthritis of both  knees 05/01/2016  . GERD (gastroesophageal reflux disease) 07/26/2015  . Toxic encephalopathy 07/21/2015  . Kidney disease, chronic, stage III (GFR 30-59 ml/min) 09/21/2014  . Abnormal cardiovascular function study 09/21/2014  . BPH (benign prostatic hypertrophy) 09/21/2014  . Hyperlipidemia   . Chronic venous insufficiency   . NSTEMI (non-ST elevated myocardial infarction) ([place]) 09/19/2014  . Type 2 diabetes mellitus with renal manifestations ([place]) 09/19/2014  . Polycythemia vera ([place]) 02/20/2011   Past Medical History:  Diagnosis Date  . Acute cystitis without hematuria   . Acute kidney injury superimposed on chronic kidney disease (HCC)    CKD 3  . BPH (benign prostatic hyperplasia)   . CKD (chronic kidney disease)   . Cognitive impairment   . Constipation    Unspecified  . Dementia   . DM (diabetes mellitus), type 2 ([place])   . Enterococcus UTI   . Esophagitis   . GERD (gastroesophageal reflux disease)   . Hiatal hernia   . HTN (hypertension)   . Hyperlipidemia   . Migraine   . Myeloproliferative disease ([place])   . OA (osteoarthritis)   . Physical deconditioning   . Polycythemia vera(238.4) 02/20/2011  . Protein calorie malnutrition ([place])   . Spinal stenosis, lumbar   . Thrombocytopenia ([place])   . Toxic encephalopathy   . Vertigo   . Volume depletion    Family History  Problem Relation Age of Onset  . Diabetes Mother   . Hypertension Mother   . Cancer Father   . Diabetes Brother   . Kidney failure Brother    Past Surgical History:  Procedure Laterality Date  . LOWER LEG SOFT TISSUE TUMOR EXCISION    . TOTAL HIP ARTHROPLASTY  1993   right   Social History   Occupational History  . Not on file.   Social History Main Topics  . Smoking status: Never Smoker  . Smokeless tobacco: Never Used  . Alcohol use Yes     Comment: rarely  . Drug use: No  . Sexual activity: No

## 2016-05-01 NOTE — Assessment & Plan Note (Signed)
Bilateral knee aspiration injection today. Given the recurrent effusions that he has with how long this been going on I would like to set him up for a trial of serial Visco supplementation with aspirations weekly.  We will go ahead and get him preapproved for Orthovisc and plan to have him follow-up in 8 weeks to begin the series.  We did discuss this would be 3 weeks in a row of shots 1 week apart.

## 2016-05-10 NOTE — Progress Notes (Signed)
Patient is Covered 80% / 20% by primary and supplemental insurance. Will submit to Orthovisc rep for product to be sent to office when patient is eligible for procedure (around 2-3 mo.) as he has just received an injection at his last visit.

## 2016-06-12 ENCOUNTER — Ambulatory Visit: Payer: Medicare Other | Admitting: Sports Medicine

## 2016-06-16 ENCOUNTER — Encounter: Payer: Self-pay | Admitting: Sports Medicine

## 2016-06-16 ENCOUNTER — Ambulatory Visit: Payer: Self-pay

## 2016-06-16 ENCOUNTER — Ambulatory Visit (INDEPENDENT_AMBULATORY_CARE_PROVIDER_SITE_OTHER): Payer: Medicare Other | Admitting: Sports Medicine

## 2016-06-16 VITALS — BP 110/80 | HR 70 | Ht 60.5 in | Wt 160.0 lb

## 2016-06-16 DIAGNOSIS — M1710 Unilateral primary osteoarthritis, unspecified knee: Secondary | ICD-10-CM

## 2016-06-16 DIAGNOSIS — M25462 Effusion, left knee: Secondary | ICD-10-CM

## 2016-06-16 DIAGNOSIS — M25461 Effusion, right knee: Secondary | ICD-10-CM

## 2016-06-16 DIAGNOSIS — M170 Bilateral primary osteoarthritis of knee: Secondary | ICD-10-CM | POA: Diagnosis not present

## 2016-06-16 NOTE — Progress Notes (Addendum)
OFFICE VISIT NOTE Aaron Stephenson, [facility] [facility]  Aaron Stephenson - 83 y.o. male MRN [id number]  Date of birth: March 08, 1924  Visit Date: 06/16/2016  PCP: Aaron Cruel, MD   Referred by: Aaron Cruel, MD  Aaron Stephenson, cma acting as scribe for Dr. Paulla Stephenson.  SUBJECTIVE:   Chief Complaint  Patient presents with  . Bilateral Knee Injections   HPI: As below and per problem based documentation when appropriate.  Follow-up to started Othovisc gel injections.     ROS  Otherwise per HPI.  HISTORY & PERTINENT PRIOR DATA:  No specialty comments available. He reports that he has never smoked. He has never used smokeless tobacco. No results for input(s): HGBA1C, LABURIC in the last 8760 hours. Medications & Allergies reviewed per EMR Patient Active Problem List   Diagnosis Date Noted  . Bilateral knee effusions 05/01/2016  . Primary osteoarthritis of both knees 05/01/2016  . GERD (gastroesophageal reflux disease) 07/26/2015  . Toxic encephalopathy 07/21/2015  . Kidney disease, chronic, stage III (GFR 30-59 ml/min) 09/21/2014  . Abnormal cardiovascular function study 09/21/2014  . BPH (benign prostatic hypertrophy) 09/21/2014  . Hyperlipidemia   . Chronic venous insufficiency   . NSTEMI (non-ST elevated myocardial infarction) ([place]) 09/19/2014  . Type 2 diabetes mellitus with renal manifestations ([place]) 09/19/2014  . Polycythemia vera ([place]) 02/20/2011   Past Medical History:  Diagnosis Date  . Acute cystitis without hematuria   . Acute kidney injury superimposed on chronic kidney disease (HCC)    CKD 3  . BPH (benign prostatic hyperplasia)   . CKD (chronic kidney disease)   . Cognitive impairment   . Constipation    Unspecified  . Dementia   . DM (diabetes mellitus), type 2 ([place])   . Enterococcus UTI   . Esophagitis   . GERD (gastroesophageal reflux disease)   . Hiatal hernia   . HTN  (hypertension)   . Hyperlipidemia   . Migraine   . Myeloproliferative disease ([place])   . OA (osteoarthritis)   . Physical deconditioning   . Polycythemia vera(238.4) 02/20/2011  . Protein calorie malnutrition ([place])   . Spinal stenosis, lumbar   . Thrombocytopenia ([place])   . Toxic encephalopathy   . Vertigo   . Volume depletion    Family History  Problem Relation Age of Onset  . Diabetes Mother   . Hypertension Mother   . Cancer Father   . Diabetes Brother   . Kidney failure Brother    Past Surgical History:  Procedure Laterality Date  . LOWER LEG SOFT TISSUE TUMOR EXCISION    . TOTAL HIP ARTHROPLASTY  1993   right   Social History   Occupational History  . Not on file.   Social History Main Topics  . Smoking status: Never Smoker  . Smokeless tobacco: Never Used  . Alcohol use Yes     Comment: rarely  . Drug use: No  . Sexual activity: No    OBJECTIVE:  VS:  HT:5' 0.5" (153.7 cm)   WT:160 lb (72.6 kg)  BMI:30.8    BP:110/80  HR:70bpm  TEMP: ( )  RESP:96 % EXAM: Findings:  Adult male.  No acute distress.  Alert and appropriate. Bilateral knees with large effusions.  Limitations in flexion extension from 5 to 95 bilaterally.  Ligamentously has 3-4 of opening with valgus testing bilaterally.  No overlying skin changes.  Lower extremity edema is 1+/4 today bilaterally  and equal.  No pain with calf squeeze.      No results found. ASSESSMENT & PLAN:   Problem List Items Addressed This Visit    Primary osteoarthritis of both knees - Primary    Long-standing issues with bilateral knee OA.   He has recurrent effusions. He has had a consultation for total knee arthroscopy would like to defer this as time.  Orthovisc series of 3 today.  Follow-up in 1 week for second set.  PROCEDURE NOTE -  ULTRASOUND GUIDEDINJECTION: Bilateral Knee Aspiration & Injections Images were obtained and interpreted by myself, Aaron Coombs, DO  Images have been saved and stored to  PACS system. Images obtained on: GE S7 Ultrasound machine  ULTRASOUND FINDINGS: Tricompartmental degenerative change  DESCRIPTION OF PROCEDURE:  The patient's clinical condition is marked by substantial pain and/or significant functional disability. Other conservative therapy has not provided relief, is contraindicated, or not appropriate. There is a reasonable likelihood that injection will significantly improve the patient's pain and/or functional impairment. After discussing the risks, benefits and expected outcomes of the injection and all questions were reviewed and answered, the patient wished to undergo the above named procedure. Verbal consent was obtained. The ultrasound was used to identify the target structure and adjacent neurovascular structures. The skin was then prepped in sterile fashion and the target structure was injected under direct visualization using sterile technique as below:  RIGHT KNEE: PREP: Alcohol, Ethel Chloride, 5cc 1% lidocaine on 25 needle APPROACH: Superiolateral,Stopcock Technique, 18 g 1 .5" needle INJECTATE: 2cc OrthoVisc ASPIRATE: 35cc serous fluid  DRESSING: Band-Aid & Pt's Body Helix Compression Sleeve  LEFT KNEE: PREP: Alcohol, Ethel Chloride, 5cc 1% lidocaine on 25 needle APPROACH: Superiolateral,Stopcock Technique, 18 g 1 .5" needle INJECTATE: 2cc OrthoVisc ASPIRATE: 40cc serous fluid  DRESSING: Band-Aid & Pt's Body Helix Compression Sleeve  Post procedural instructions including recommending icing and warning signs for infection were reviewed. This procedure was well tolerated and there were no complications.   IMPRESSION: Succesful US Guided Aspirations & injections          Relevant Orders   US GUIDED NEEDLE PLACEMENT(NO LINKED CHARGES)    Other Visit Diagnoses    Primary localized osteoarthrosis, lower leg          Follow-up: Return in about 1 week (around 06/23/2016) for repeat ORTHOVISC Injections.  Otherwise please see  problem oriented charting as below.  CMA/ATC served as Education administrator during this visit. History, Physical, and Plan performed by medical provider. Documentation and orders reviewed and attested to.      Aaron Stephenson, [facility] Physician    06/26/2016 5:54 AM

## 2016-06-23 ENCOUNTER — Ambulatory Visit: Payer: Medicare Other | Admitting: Sports Medicine

## 2016-06-23 NOTE — Progress Notes (Deleted)
OFFICE VISIT NOTE Aaron Stephenson, Aaron Stephenson at [facility]  Aaron Stephenson - 83 y.o. male MRN [id number]  Date of birth: 1924/11/16  Visit Date: 06/23/2016  PCP: Aaron Cruel, MD   Referred by: Aaron Cruel, MD  {Scribe Credentials} acting as scribe for Dr. Paulla Stephenson.  SUBJECTIVE:  No chief complaint on file.  HPI: As below and per problem based documentation when appropriate.  Follow-up for bilateral knee pain {Onset/duration} {Mechanism/Context}  The pain is described as {character} and is rated as {severity}.  Worsened with {exacerbating} Improves with {alleviating} Therapies tried include {therapies}  Other associated symptoms include: {none}    ROS  Otherwise per HPI.  HISTORY & PERTINENT PRIOR DATA:  No specialty comments available. He reports that he has never smoked. He has never used smokeless tobacco. No results for input(s): HGBA1C, LABURIC in the last 8760 hours. Medications & Allergies reviewed per EMR Patient Active Problem List   Diagnosis Date Noted  . Bilateral knee effusions 05/01/2016  . Primary osteoarthritis of both knees 05/01/2016  . GERD (gastroesophageal reflux disease) 07/26/2015  . Toxic encephalopathy 07/21/2015  . Kidney disease, chronic, stage III (GFR 30-59 ml/min) 09/21/2014  . Abnormal cardiovascular function study 09/21/2014  . BPH (benign prostatic hypertrophy) 09/21/2014  . Hyperlipidemia   . Chronic venous insufficiency   . NSTEMI (non-ST elevated myocardial infarction) ([place]) 09/19/2014  . Type 2 diabetes mellitus with renal manifestations ([place]) 09/19/2014  . Polycythemia vera ([place]) 02/20/2011   Past Medical History:  Diagnosis Date  . Acute cystitis without hematuria   . Acute kidney injury superimposed on chronic kidney disease (HCC)    CKD 3  . BPH (benign prostatic hyperplasia)   . CKD (chronic kidney disease)   . Cognitive impairment   . Constipation     Unspecified  . Dementia   . DM (diabetes mellitus), type 2 ([place])   . Enterococcus UTI   . Esophagitis   . GERD (gastroesophageal reflux disease)   . Hiatal hernia   . HTN (hypertension)   . Hyperlipidemia   . Migraine   . Myeloproliferative disease ([place])   . OA (osteoarthritis)   . Physical deconditioning   . Polycythemia vera(238.4) 02/20/2011  . Protein calorie malnutrition ([place])   . Spinal stenosis, lumbar   . Thrombocytopenia ([place])   . Toxic encephalopathy   . Vertigo   . Volume depletion    Family History  Problem Relation Age of Onset  . Diabetes Mother   . Hypertension Mother   . Cancer Father   . Diabetes Brother   . Kidney failure Brother    Past Surgical History:  Procedure Laterality Date  . LOWER LEG SOFT TISSUE TUMOR EXCISION    . TOTAL HIP ARTHROPLASTY  1993   right   Social History   Occupational History  . Not on file.   Social History Main Topics  . Smoking status: Never Smoker  . Smokeless tobacco: Never Used  . Alcohol use Yes     Comment: rarely  . Drug use: No  . Sexual activity: No    OBJECTIVE:  VS:  HT:    WT:   BMI:     BP:   HR: bpm  TEMP: ( )  RESP:  EXAM: No additional findings.    No results found. ASSESSMENT & PLAN:   Problem List Items Addressed This Visit    None      Follow-up: No  Follow-up on file.   CMA/ATC served as Education administrator during this visit. History, Physical, and Plan performed by medical provider. Documentation and orders reviewed and attested to.      Aaron Stephenson, [facility] Physician    06/23/2016 9:57 AM

## 2016-06-26 ENCOUNTER — Ambulatory Visit: Payer: Medicare Other | Admitting: Sports Medicine

## 2016-06-26 NOTE — Assessment & Plan Note (Addendum)
Long-standing issues with bilateral knee OA.   He has recurrent effusions. He has had a consultation for total knee arthroscopy would like to defer this as time.  Orthovisc series of 3 today.  Follow-up in 1 week for second set.  PROCEDURE NOTE -  ULTRASOUND GUIDEDINJECTION: Bilateral Knee Aspiration & Injections Images were obtained and interpreted by myself, Teresa Coombs, DO  Images have been saved and stored to PACS system. Images obtained on: GE S7 Ultrasound machine  ULTRASOUND FINDINGS: Tricompartmental degenerative change  DESCRIPTION OF PROCEDURE:  The patient's clinical condition is marked by substantial pain and/or significant functional disability. Other conservative therapy has not provided relief, is contraindicated, or not appropriate. There is a reasonable likelihood that injection will significantly improve the patient's pain and/or functional impairment. After discussing the risks, benefits and expected outcomes of the injection and all questions were reviewed and answered, the patient wished to undergo the above named procedure. Verbal consent was obtained. The ultrasound was used to identify the target structure and adjacent neurovascular structures. The skin was then prepped in sterile fashion and the target structure was injected under direct visualization using sterile technique as below:  RIGHT KNEE: PREP: Alcohol, Ethel Chloride, 5cc 1% lidocaine on 25 needle APPROACH: Superiolateral,Stopcock Technique, 18 g 1 .5" needle INJECTATE: 2cc OrthoVisc ASPIRATE: 35cc serous fluid  DRESSING: Band-Aid & Pt's Body Helix Compression Sleeve  LEFT KNEE: PREP: Alcohol, Ethel Chloride, 5cc 1% lidocaine on 25 needle APPROACH: Superiolateral,Stopcock Technique, 18 g 1 .5" needle INJECTATE: 2cc OrthoVisc ASPIRATE: 40cc serous fluid  DRESSING: Band-Aid & Pt's Body Helix Compression Sleeve  Post procedural instructions including recommending icing and warning signs for  infection were reviewed. This procedure was well tolerated and there were no complications.   IMPRESSION: Succesful US Guided Aspirations & injections

## 2016-06-27 ENCOUNTER — Emergency Department (HOSPITAL_BASED_OUTPATIENT_CLINIC_OR_DEPARTMENT_OTHER): Payer: Medicare Other

## 2016-06-27 ENCOUNTER — Emergency Department (HOSPITAL_BASED_OUTPATIENT_CLINIC_OR_DEPARTMENT_OTHER)
Admission: EM | Admit: 2016-06-27 | Discharge: 2016-06-27 | Disposition: A | Discharge status: Home / Self Care | Payer: Medicare Other | Attending: Emergency Medicine | Admitting: Emergency Medicine

## 2016-06-27 ENCOUNTER — Other Ambulatory Visit: Payer: Self-pay

## 2016-06-27 ENCOUNTER — Encounter (HOSPITAL_BASED_OUTPATIENT_CLINIC_OR_DEPARTMENT_OTHER): Payer: Self-pay

## 2016-06-27 DIAGNOSIS — J90 Pleural effusion, not elsewhere classified: Secondary | ICD-10-CM | POA: Diagnosis not present

## 2016-06-27 DIAGNOSIS — E1122 Type 2 diabetes mellitus with diabetic chronic kidney disease: Secondary | ICD-10-CM | POA: Insufficient documentation

## 2016-06-27 DIAGNOSIS — R443 Hallucinations, unspecified: Secondary | ICD-10-CM | POA: Diagnosis not present

## 2016-06-27 DIAGNOSIS — N183 Chronic kidney disease, stage 3 (moderate): Secondary | ICD-10-CM | POA: Insufficient documentation

## 2016-06-27 DIAGNOSIS — Z7984 Long term (current) use of oral hypoglycemic drugs: Secondary | ICD-10-CM | POA: Insufficient documentation

## 2016-06-27 DIAGNOSIS — R410 Disorientation, unspecified: Secondary | ICD-10-CM | POA: Insufficient documentation

## 2016-06-27 DIAGNOSIS — I129 Hypertensive chronic kidney disease with stage 1 through stage 4 chronic kidney disease, or unspecified chronic kidney disease: Secondary | ICD-10-CM | POA: Insufficient documentation

## 2016-06-27 DIAGNOSIS — R350 Frequency of micturition: Secondary | ICD-10-CM | POA: Diagnosis not present

## 2016-06-27 DIAGNOSIS — R4182 Altered mental status, unspecified: Secondary | ICD-10-CM | POA: Diagnosis not present

## 2016-06-27 LAB — URINALYSIS, ROUTINE W REFLEX MICROSCOPIC
BILIRUBIN URINE: NEGATIVE
Glucose, UA: NEGATIVE mg/dL
HGB URINE DIPSTICK: NEGATIVE
Ketones, ur: 15 mg/dL — AB
Leukocytes, UA: NEGATIVE
NITRITE: NEGATIVE
PH: 5.5 (ref 5.0–8.0)
Protein, ur: NEGATIVE mg/dL
SPECIFIC GRAVITY, URINE: 1.017 (ref 1.005–1.030)

## 2016-06-27 LAB — CBC WITH DIFFERENTIAL/PLATELET
Basophils Absolute: 0 10*3/uL (ref 0.0–0.1)
Basophils Relative: 1 %
EOS PCT: 3 %
Eosinophils Absolute: 0.1 10*3/uL (ref 0.0–0.7)
HEMATOCRIT: 47.3 % (ref 39.0–52.0)
Hemoglobin: 16 g/dL (ref 13.0–17.0)
LYMPHS ABS: 1.1 10*3/uL (ref 0.7–4.0)
LYMPHS PCT: 22 %
MCH: 29.6 pg (ref 26.0–34.0)
MCHC: 33.8 g/dL (ref 30.0–36.0)
MCV: 87.6 fL (ref 78.0–100.0)
Monocytes Absolute: 0.5 10*3/uL (ref 0.1–1.0)
Monocytes Relative: 10 %
Neutro Abs: 3.3 10*3/uL (ref 1.7–7.7)
Neutrophils Relative %: 64 %
PLATELETS: 123 10*3/uL — AB (ref 150–400)
RBC: 5.4 MIL/uL (ref 4.22–5.81)
RDW: 14.4 % (ref 11.5–15.5)
WBC: 5.1 10*3/uL (ref 4.0–10.5)

## 2016-06-27 LAB — COMPREHENSIVE METABOLIC PANEL
ALK PHOS: 53 U/L (ref 38–126)
ALT: 13 U/L — ABNORMAL LOW (ref 17–63)
ANION GAP: 11 (ref 5–15)
AST: 26 U/L (ref 15–41)
Albumin: 3.6 g/dL (ref 3.5–5.0)
BILIRUBIN TOTAL: 1.8 mg/dL — AB (ref 0.3–1.2)
BUN: 26 mg/dL — ABNORMAL HIGH (ref 6–20)
CALCIUM: 9.6 mg/dL (ref 8.9–10.3)
CO2: 25 mmol/L (ref 22–32)
CREATININE: 1.64 mg/dL — AB (ref 0.61–1.24)
Chloride: 105 mmol/L (ref 101–111)
GFR, EST AFRICAN AMERICAN: 41 mL/min — AB (ref >60)
GFR, EST NON AFRICAN AMERICAN: 35 mL/min — AB (ref >60)
Glucose, Bld: 67 mg/dL (ref 65–99)
Potassium: 4.3 mmol/L (ref 3.5–5.1)
SODIUM: 141 mmol/L (ref 135–145)
TOTAL PROTEIN: 6.3 g/dL — AB (ref 6.5–8.1)

## 2016-06-27 LAB — ETHANOL: Alcohol, Ethyl (B): <5 mg/dL (ref <5)

## 2016-06-27 MED ORDER — SODIUM CHLORIDE 0.9 % IV BOLUS (SEPSIS)
500.0000 mL | Freq: Once | INTRAVENOUS | Status: AC
  Administered 2016-06-27: 500 mL via INTRAVENOUS

## 2016-06-27 NOTE — ED Provider Notes (Signed)
[facility] DEPT [facility] Provider Note   CSN: [id number] Arrival date & time: 06/27/16  1754  By signing my name below, I, Mayer Masker, attest that this documentation has been prepared under the direction and in the presence of Davonna Belling, MD. Electronically Signed: Mayer Masker, Scribe. 06/27/16. 7:09 PM.  History   Chief Complaint Chief Complaint  Patient presents with  . Altered Mental Status   The history is provided by the patient and the spouse. No language interpreter was used.   HPI Comments: Aaron Stephenson is a 83 y.o. male who presents to the Emergency Department complaining of acute altered mental status that began earlier today. His family states he was confused earlier today and when he went to see his PCP, he was referred to come to the ED. He states he feels "a little off". He was on oxycontin but stopped taking it 3 weeks ago for back pain. Pt walks with a cane but denies any issues beyond baseline. He denies changes to appetite, HA, CP, SOB, nausea, vomiting, diarrhea, and falling.     Past Medical History:  Diagnosis Date  . Acute cystitis without hematuria   . Acute kidney injury superimposed on chronic kidney disease (HCC)    CKD 3  . BPH (benign prostatic hyperplasia)   . CKD (chronic kidney disease)   . Cognitive impairment   . Constipation    Unspecified  . Dementia   . DM (diabetes mellitus), type 2 ([place])   . Enterococcus UTI   . Esophagitis   . GERD (gastroesophageal reflux disease)   . Hiatal hernia   . HTN (hypertension)   . Hyperlipidemia   . Migraine   . Myeloproliferative disease ([place])   . OA (osteoarthritis)   . Physical deconditioning   . Polycythemia vera(238.4) 02/20/2011  . Protein calorie malnutrition ([place])   . Spinal stenosis, lumbar   . Thrombocytopenia ([place])   . Toxic encephalopathy   . Vertigo   . Volume depletion     Patient Active Problem List   Diagnosis Date Noted  . Bilateral knee effusions 05/01/2016  .  Primary osteoarthritis of both knees 05/01/2016  . GERD (gastroesophageal reflux disease) 07/26/2015  . Toxic encephalopathy 07/21/2015  . Kidney disease, chronic, stage III (GFR 30-59 ml/min) 09/21/2014  . Abnormal cardiovascular function study 09/21/2014  . BPH (benign prostatic hypertrophy) 09/21/2014  . Hyperlipidemia   . Chronic venous insufficiency   . NSTEMI (non-ST elevated myocardial infarction) ([place]) 09/19/2014  . Type 2 diabetes mellitus with renal manifestations ([place]) 09/19/2014  . Polycythemia vera ([place]) 02/20/2011    Past Surgical History:  Procedure Laterality Date  . LOWER LEG SOFT TISSUE TUMOR EXCISION    . TOTAL HIP ARTHROPLASTY  1993   right       Home Medications    Prior to Admission medications   Medication Sig Start Date End Date Taking? Authorizing Provider  ALPRAZolam (XANAX) 0.25 MG tablet Take 1 tablet (0.25 mg total) by mouth daily as needed for anxiety. 07/24/15   Bonnell Public, MD  Ascorbic Acid (VITAMIN C) 1000 MG tablet Take 1,000 mg by mouth daily as needed (immune system support).     [provider]  atorvastatin (LIPITOR) 10 MG tablet Take 10 mg by mouth at bedtime.    [provider]  clopidogrel (PLAVIX) 75 MG tablet Take 1 tablet (75 mg total) by mouth daily. 09/21/14   Jacolyn Reedy, MD  docusate sodium (COLACE) 100 MG capsule Take 100 mg  by mouth daily.    [provider]  esomeprazole (NEXIUM) 40 MG capsule Take 40 mg by mouth daily as needed (for acid reflux or heartburn).     [provider]  finasteride (PROSCAR) 5 MG tablet Take 5 mg by mouth at bedtime.  06/16/14   [provider]  glipiZIDE (GLUCOTROL XL) 5 MG 24 hr tablet Take 5 mg by mouth daily.    [provider]  isosorbide mononitrate (IMDUR) 60 MG 24 hr tablet Take 1 tablet (60 mg total) by mouth daily. 09/21/14   Jacolyn Reedy, MD  lisinopril (PRINIVIL,ZESTRIL) 5 MG tablet Take 5 mg by mouth at bedtime.      [provider]  metoprolol tartrate (LOPRESSOR) 25 MG tablet Take 12.5 mg by mouth 2 (two) times daily.    [provider]  mirtazapine (REMERON) 15 MG tablet Take 15 mg by mouth at bedtime.    [provider]  Multiple Vitamins-Minerals (MULTIVITAMIN WITH MINERALS) tablet Take 1 tablet by mouth daily.    [provider]  nitroGLYCERIN (NITROSTAT) 0.4 MG SL tablet Place 1 tablet (0.4 mg total) under the tongue every 5 (five) minutes x 3 doses as needed for chest pain. 09/21/14   Jacolyn Reedy, MD  oxyCODONE (OXY IR/ROXICODONE) 5 MG immediate release tablet Take one tablet by mouth every 6 hours as needed for breakthrough pain 07/26/15   Reed, Tiffany L, DO  Protein (PROCEL) POWD Take 1 scoop by mouth 2 (two) times daily.    [provider]  tamsulosin (FLOMAX) 0.4 MG CAPS capsule Take 0.4 mg by mouth daily.     [provider]  tolterodine (DETROL LA) 4 MG 24 hr capsule Take 4 mg by mouth at bedtime.    [provider]  vitamin E (VITAMIN E) 400 UNIT capsule Take 400 Units by mouth daily as needed (for supplementation).     [provider]    Family History Family History  Problem Relation Age of Onset  . Diabetes Mother   . Hypertension Mother   . Cancer Father   . Diabetes Brother   . Kidney failure Brother     Social History Social History  Substance Use Topics  . Smoking status: Never Smoker  . Smokeless tobacco: Never Used  . Alcohol use Yes     Comment: rarely     Allergies   Penicillins; Aspirin; Ciprofloxacin; Macrodantin [nitrofurantoin macrocrystal]; Other; Oxycodone; Sulfamethoxazole; Tramadol; Unisom [doxylamine succinate (sleep)]; and Xanax [alprazolam]   Review of Systems Review of Systems  Constitutional: Negative for appetite change, chills and fever.  HENT: Negative for ear pain, rhinorrhea and sore throat.   Eyes: Negative for pain and visual disturbance.  Respiratory: Negative for  cough and shortness of breath.   Cardiovascular: Negative for chest pain and palpitations.  Gastrointestinal: Negative for abdominal pain, diarrhea, nausea and vomiting.  Genitourinary: Negative for dysuria.  Musculoskeletal: Negative for arthralgias and back pain.  Skin: Negative for color change and rash.  Neurological: Negative for syncope and headaches.  Psychiatric/Behavioral: Positive for confusion.     Physical Exam Updated Vital Signs BP 132/80   Pulse 79   Temp 97.9 F (36.6 C) (Oral)   Resp (!) 23   Ht 5\' 4"  (1.626 m)   Wt 160 lb (72.6 kg)   SpO2 93%   BMI 27.46 kg/m   Physical Exam  Constitutional: He is oriented to person, place, and time. He appears well-developed and well-nourished. No distress.  HENT:  Head: Normocephalic and atraumatic.  Eyes: Pupils are equal, round, and reactive to light. Left eye exhibits discharge (mild).  Cardiovascular: Normal rate and regular rhythm.   Pulmonary/Chest: Effort normal and breath sounds normal.  Abdominal: Soft. Bowel sounds are normal.  Musculoskeletal: He exhibits no edema.  Neurological: He is alert and oriented to person, place, and time.  He does not know the date/time; thought it was 1970  Skin: Skin is warm and dry.  Psychiatric: He has a normal mood and affect.  Nursing note and vitals reviewed.    ED Treatments / Results  DIAGNOSTIC STUDIES: Oxygen Saturation is 95% on RA, normal by my interpretation.    COORDINATION OF CARE: 6:58 PM Discussed treatment plan with pt at bedside and pt agreed to plan.  Labs (all labs ordered are listed, but only abnormal results are displayed) Labs Reviewed  COMPREHENSIVE METABOLIC PANEL - Abnormal; Notable for the following:       Result Value   BUN 26 (*)    Creatinine, Ser 1.64 (*)    Total Protein 6.3 (*)    ALT 13 (*)    Total Bilirubin 1.8 (*)    GFR calc non Af Amer 35 (*)    GFR calc Af Amer 41 (*)    All other components within normal limits  URINALYSIS,  ROUTINE W REFLEX MICROSCOPIC - Abnormal; Notable for the following:    Color, Urine AMBER (*)    Ketones, ur 15 (*)    All other components within normal limits  CBC WITH DIFFERENTIAL/PLATELET - Abnormal; Notable for the following:    Platelets 123 (*)    All other components within normal limits  ETHANOL    EKG  EKG Interpretation None       Radiology Dg Chest 2 View  Result Date: 06/27/2016 CLINICAL DATA:  Altered mental status EXAM: CHEST  2 VIEW COMPARISON:  09/18/2014 FINDINGS: Tiny bilateral pleural effusions. There is cardiomegaly with mild central congestion. Aortic atherosclerosis. No pneumothorax. Advanced arthritis of the bilateral shoulders IMPRESSION: Cardiomegaly with mild central congestion and tiny pleural effusions. Electronically Signed   By: Donavan Foil M.D.   On: 06/27/2016 20:16   Ct Head Wo Contrast  Result Date: 06/27/2016 CLINICAL DATA:  Mental status change. Confusion. Hallucinations. No reported injury. EXAM: CT HEAD WITHOUT CONTRAST TECHNIQUE: Contiguous axial images were obtained from the base of the skull through the vertex without intravenous contrast. COMPARISON:  None. FINDINGS: Brain: No evidence of parenchymal hemorrhage or extra-axial fluid collection. No mass lesion, mass effect, or midline shift. No CT evidence of acute infarction. Nonspecific mild subcortical and periventricular white matter hypodensity, most in keeping with chronic small vessel ischemic change. Generalized cerebral volume loss. No ventriculomegaly. Vascular: No acute abnormality. Skull: No evidence of calvarial fracture. Sinuses/Orbits: The visualized paranasal sinuses are essentially clear. Other:  The mastoid air cells are unopacified. IMPRESSION: 1.  No evidence of acute intracranial abnormality. 2. Generalized cerebral volume loss and mild chronic small vessel ischemia. Electronically Signed   By: Ilona Sorrel M.D.   On: 06/27/2016 20:01    Procedures Procedures (including  critical care time)  Medications Ordered in ED Medications  sodium chloride 0.9 % bolus 500 mL (0 mLs Intravenous Stopped 06/27/16 2039)     Initial Impression / Assessment and Plan / ED Course  I have reviewed the triage vital signs and the nursing notes.  Pertinent labs & imaging results that were available during my care of the patient were  reviewed by me and considered in my medical decision making (see chart for details).     Patient with confusion. Mental status changes. Lab work reassuring. Imaging reassuring. Apparently has had some confusion at home the last few days and occasional episodes before. Creatinine mildly increased. Reportedly has had good oral intake. Discussed with patient and family members. They would rather be home and will be discharged. Have given some IV fluids and kidney function will need to be followed.  Final Clinical Impressions(s) / ED Diagnoses   Final diagnoses:  Confusion    New Prescriptions New Prescriptions   No medications on file  I personally performed the services described in this documentation, which was scribed in my presence. The recorded information has been reviewed and is accurate.       Davonna Belling, MD 06/27/16 2154

## 2016-06-27 NOTE — ED Triage Notes (Signed)
Daughter states pt with confusion, hallucinations x 1-2 weeks-was sent from PCP office-NAD-presents to triage in w/c-alert to name, DOB-unable to give correct date or age

## 2016-06-27 NOTE — ED Notes (Signed)
Patient alert to self, place, situation but confused to time at present.  Patient able to answer who was with him in the ER today but did not remember all of his children's name.  Patient followed instructions given by nurse without difficulty.  Wife at the bedside.  Patient in NAD at present.  Side rails up x2.

## 2016-06-27 NOTE — ED Notes (Signed)
Patient transported to X-ray 

## 2016-06-27 NOTE — Discharge Instructions (Signed)
Follow-up with your primary care doctor as needed

## 2016-06-27 NOTE — ED Notes (Signed)
Patient transported to CT 

## 2016-07-04 DIAGNOSIS — Z7984 Long term (current) use of oral hypoglycemic drugs: Secondary | ICD-10-CM | POA: Diagnosis not present

## 2016-07-04 DIAGNOSIS — N400 Enlarged prostate without lower urinary tract symptoms: Secondary | ICD-10-CM | POA: Diagnosis not present

## 2016-07-04 DIAGNOSIS — E785 Hyperlipidemia, unspecified: Secondary | ICD-10-CM | POA: Diagnosis not present

## 2016-07-04 DIAGNOSIS — F0391 Unspecified dementia with behavioral disturbance: Secondary | ICD-10-CM | POA: Diagnosis not present

## 2016-07-04 DIAGNOSIS — E1122 Type 2 diabetes mellitus with diabetic chronic kidney disease: Secondary | ICD-10-CM | POA: Diagnosis not present

## 2016-07-04 DIAGNOSIS — K219 Gastro-esophageal reflux disease without esophagitis: Secondary | ICD-10-CM | POA: Diagnosis not present

## 2016-07-04 DIAGNOSIS — N189 Chronic kidney disease, unspecified: Secondary | ICD-10-CM | POA: Diagnosis not present

## 2016-07-04 DIAGNOSIS — R410 Disorientation, unspecified: Secondary | ICD-10-CM | POA: Diagnosis not present

## 2016-07-04 DIAGNOSIS — I10 Essential (primary) hypertension: Secondary | ICD-10-CM | POA: Diagnosis not present

## 2016-07-06 DIAGNOSIS — E1122 Type 2 diabetes mellitus with diabetic chronic kidney disease: Secondary | ICD-10-CM | POA: Diagnosis not present

## 2016-07-06 DIAGNOSIS — N401 Enlarged prostate with lower urinary tract symptoms: Secondary | ICD-10-CM | POA: Diagnosis not present

## 2016-07-06 DIAGNOSIS — Z7984 Long term (current) use of oral hypoglycemic drugs: Secondary | ICD-10-CM | POA: Diagnosis not present

## 2016-07-06 DIAGNOSIS — I129 Hypertensive chronic kidney disease with stage 1 through stage 4 chronic kidney disease, or unspecified chronic kidney disease: Secondary | ICD-10-CM | POA: Diagnosis not present

## 2016-07-06 DIAGNOSIS — Z7902 Long term (current) use of antithrombotics/antiplatelets: Secondary | ICD-10-CM | POA: Diagnosis not present

## 2016-07-06 DIAGNOSIS — F0391 Unspecified dementia with behavioral disturbance: Secondary | ICD-10-CM | POA: Diagnosis not present

## 2016-07-06 DIAGNOSIS — N189 Chronic kidney disease, unspecified: Secondary | ICD-10-CM | POA: Diagnosis not present

## 2016-07-06 DIAGNOSIS — M1990 Unspecified osteoarthritis, unspecified site: Secondary | ICD-10-CM | POA: Diagnosis not present

## 2016-07-07 DIAGNOSIS — H01005 Unspecified blepharitis left lower eyelid: Secondary | ICD-10-CM | POA: Diagnosis not present

## 2016-07-07 DIAGNOSIS — H01002 Unspecified blepharitis right lower eyelid: Secondary | ICD-10-CM | POA: Diagnosis not present

## 2016-07-13 DIAGNOSIS — N189 Chronic kidney disease, unspecified: Secondary | ICD-10-CM | POA: Diagnosis not present

## 2016-07-13 DIAGNOSIS — E1122 Type 2 diabetes mellitus with diabetic chronic kidney disease: Secondary | ICD-10-CM | POA: Diagnosis not present

## 2016-07-13 DIAGNOSIS — I129 Hypertensive chronic kidney disease with stage 1 through stage 4 chronic kidney disease, or unspecified chronic kidney disease: Secondary | ICD-10-CM | POA: Diagnosis not present

## 2016-07-13 DIAGNOSIS — M1990 Unspecified osteoarthritis, unspecified site: Secondary | ICD-10-CM | POA: Diagnosis not present

## 2016-07-13 DIAGNOSIS — F0391 Unspecified dementia with behavioral disturbance: Secondary | ICD-10-CM | POA: Diagnosis not present

## 2016-07-13 DIAGNOSIS — N401 Enlarged prostate with lower urinary tract symptoms: Secondary | ICD-10-CM | POA: Diagnosis not present

## 2016-07-17 DIAGNOSIS — N189 Chronic kidney disease, unspecified: Secondary | ICD-10-CM | POA: Diagnosis not present

## 2016-07-17 DIAGNOSIS — N400 Enlarged prostate without lower urinary tract symptoms: Secondary | ICD-10-CM | POA: Diagnosis not present

## 2016-07-17 DIAGNOSIS — R410 Disorientation, unspecified: Secondary | ICD-10-CM | POA: Diagnosis not present

## 2016-07-17 DIAGNOSIS — I10 Essential (primary) hypertension: Secondary | ICD-10-CM | POA: Diagnosis not present

## 2016-07-17 DIAGNOSIS — Z79899 Other long term (current) drug therapy: Secondary | ICD-10-CM | POA: Diagnosis not present

## 2016-07-17 DIAGNOSIS — F0391 Unspecified dementia with behavioral disturbance: Secondary | ICD-10-CM | POA: Diagnosis not present

## 2016-07-17 DIAGNOSIS — E785 Hyperlipidemia, unspecified: Secondary | ICD-10-CM | POA: Diagnosis not present

## 2016-07-17 DIAGNOSIS — K219 Gastro-esophageal reflux disease without esophagitis: Secondary | ICD-10-CM | POA: Diagnosis not present

## 2016-07-17 DIAGNOSIS — Z7984 Long term (current) use of oral hypoglycemic drugs: Secondary | ICD-10-CM | POA: Diagnosis not present

## 2016-07-17 DIAGNOSIS — L603 Nail dystrophy: Secondary | ICD-10-CM | POA: Diagnosis not present

## 2016-07-17 DIAGNOSIS — E1122 Type 2 diabetes mellitus with diabetic chronic kidney disease: Secondary | ICD-10-CM | POA: Diagnosis not present

## 2016-07-19 DIAGNOSIS — N401 Enlarged prostate with lower urinary tract symptoms: Secondary | ICD-10-CM | POA: Diagnosis not present

## 2016-07-19 DIAGNOSIS — E1122 Type 2 diabetes mellitus with diabetic chronic kidney disease: Secondary | ICD-10-CM | POA: Diagnosis not present

## 2016-07-19 DIAGNOSIS — N189 Chronic kidney disease, unspecified: Secondary | ICD-10-CM | POA: Diagnosis not present

## 2016-07-19 DIAGNOSIS — F0391 Unspecified dementia with behavioral disturbance: Secondary | ICD-10-CM | POA: Diagnosis not present

## 2016-07-19 DIAGNOSIS — I129 Hypertensive chronic kidney disease with stage 1 through stage 4 chronic kidney disease, or unspecified chronic kidney disease: Secondary | ICD-10-CM | POA: Diagnosis not present

## 2016-07-19 DIAGNOSIS — M1990 Unspecified osteoarthritis, unspecified site: Secondary | ICD-10-CM | POA: Diagnosis not present

## 2016-07-21 DIAGNOSIS — F0391 Unspecified dementia with behavioral disturbance: Secondary | ICD-10-CM | POA: Diagnosis not present

## 2016-07-21 DIAGNOSIS — I129 Hypertensive chronic kidney disease with stage 1 through stage 4 chronic kidney disease, or unspecified chronic kidney disease: Secondary | ICD-10-CM | POA: Diagnosis not present

## 2016-07-21 DIAGNOSIS — M1990 Unspecified osteoarthritis, unspecified site: Secondary | ICD-10-CM | POA: Diagnosis not present

## 2016-07-21 DIAGNOSIS — N401 Enlarged prostate with lower urinary tract symptoms: Secondary | ICD-10-CM | POA: Diagnosis not present

## 2016-07-21 DIAGNOSIS — E1122 Type 2 diabetes mellitus with diabetic chronic kidney disease: Secondary | ICD-10-CM | POA: Diagnosis not present

## 2016-07-21 DIAGNOSIS — N189 Chronic kidney disease, unspecified: Secondary | ICD-10-CM | POA: Diagnosis not present

## 2016-07-26 DIAGNOSIS — N189 Chronic kidney disease, unspecified: Secondary | ICD-10-CM | POA: Diagnosis not present

## 2016-07-26 DIAGNOSIS — N401 Enlarged prostate with lower urinary tract symptoms: Secondary | ICD-10-CM | POA: Diagnosis not present

## 2016-07-26 DIAGNOSIS — F0391 Unspecified dementia with behavioral disturbance: Secondary | ICD-10-CM | POA: Diagnosis not present

## 2016-07-26 DIAGNOSIS — I129 Hypertensive chronic kidney disease with stage 1 through stage 4 chronic kidney disease, or unspecified chronic kidney disease: Secondary | ICD-10-CM | POA: Diagnosis not present

## 2016-07-26 DIAGNOSIS — M1990 Unspecified osteoarthritis, unspecified site: Secondary | ICD-10-CM | POA: Diagnosis not present

## 2016-07-26 DIAGNOSIS — E1122 Type 2 diabetes mellitus with diabetic chronic kidney disease: Secondary | ICD-10-CM | POA: Diagnosis not present

## 2016-08-01 DIAGNOSIS — M1990 Unspecified osteoarthritis, unspecified site: Secondary | ICD-10-CM | POA: Diagnosis not present

## 2016-08-01 DIAGNOSIS — F0391 Unspecified dementia with behavioral disturbance: Secondary | ICD-10-CM | POA: Diagnosis not present

## 2016-08-01 DIAGNOSIS — I129 Hypertensive chronic kidney disease with stage 1 through stage 4 chronic kidney disease, or unspecified chronic kidney disease: Secondary | ICD-10-CM | POA: Diagnosis not present

## 2016-08-01 DIAGNOSIS — E1122 Type 2 diabetes mellitus with diabetic chronic kidney disease: Secondary | ICD-10-CM | POA: Diagnosis not present

## 2016-08-01 DIAGNOSIS — N189 Chronic kidney disease, unspecified: Secondary | ICD-10-CM | POA: Diagnosis not present

## 2016-08-01 DIAGNOSIS — N401 Enlarged prostate with lower urinary tract symptoms: Secondary | ICD-10-CM | POA: Diagnosis not present

## 2016-08-02 ENCOUNTER — Other Ambulatory Visit: Payer: Self-pay | Admitting: Nurse Practitioner

## 2016-08-07 ENCOUNTER — Ambulatory Visit (INDEPENDENT_AMBULATORY_CARE_PROVIDER_SITE_OTHER): Payer: Medicare Other | Admitting: Podiatry

## 2016-08-07 ENCOUNTER — Encounter: Payer: Self-pay | Admitting: Podiatry

## 2016-08-07 DIAGNOSIS — B351 Tinea unguium: Secondary | ICD-10-CM | POA: Diagnosis not present

## 2016-08-07 DIAGNOSIS — R600 Localized edema: Secondary | ICD-10-CM

## 2016-08-07 DIAGNOSIS — E0842 Diabetes mellitus due to underlying condition with diabetic polyneuropathy: Secondary | ICD-10-CM | POA: Diagnosis not present

## 2016-08-07 DIAGNOSIS — M79676 Pain in unspecified toe(s): Secondary | ICD-10-CM | POA: Diagnosis not present

## 2016-08-10 ENCOUNTER — Telehealth: Payer: Self-pay | Admitting: Podiatry

## 2016-08-10 DIAGNOSIS — K4090 Unilateral inguinal hernia, without obstruction or gangrene, not specified as recurrent: Secondary | ICD-10-CM | POA: Diagnosis not present

## 2016-08-10 NOTE — Progress Notes (Signed)
   SUBJECTIVE Patient with a history of diabetes mellitus presents to office today complaining of elongated, thickened nails. Pain while ambulating in shoes. Patient is unable to trim their own nails. He also reports swelling in the bilateral lower extremities. He reports a skin lesion to the medial side of the left foot but denies any pain.  OBJECTIVE General Patient is awake, alert, and oriented x 3 and in no acute distress. Derm Skin is dry and supple bilateral. Negative open lesions or macerations. Remaining integument unremarkable. Nails are tender, long, thickened and dystrophic with subungual debris, consistent with onychomycosis, 1-5 bilateral. No signs of infection noted. Vasc  DP and PT pedal pulses palpable bilaterally. Temperature gradient within normal limits.  Neuro Epicritic and protective threshold sensation diminished bilaterally.  Musculoskeletal Exam No symptomatic pedal deformities noted bilateral. Muscular strength within normal limits.  ASSESSMENT 1. Diabetes Mellitus w/ peripheral neuropathy 2. Onychomycosis of nail due to dermatophyte bilateral 3. Pain in foot bilateral  PLAN OF CARE 1. Patient evaluated today. 2. Instructed to maintain good pedal hygiene and foot care. Stressed importance of controlling blood sugar.  3. Mechanical debridement of nails 1-5 bilaterally performed using a nail nipper. Filed with dremel without incident.  4. Recommend follow-up with PCP to manage bilateral lower extremity edema and consider possible diuretic medication  5. Return to clinic in 3 mos.     Edrick Kins, DPM [facility]  Dr. Edrick Kins, [address]                                        [place], [place] [address]                Office [phone]  Fax [phone]

## 2016-08-10 NOTE — Telephone Encounter (Signed)
Hello, my father saw Dr. Amalia Hailey in your office on Monday and Dr. Amalia Hailey was supposed to get in touch PCP Dr. Ulanda Edison about getting fluid pills prescribed. I've called both Dr. Jodi Mourning office and the pharmacy and neither have received anything from your office. Can you please call me back to let me know the status. You can reach me at [phone]. I am on his HIPPA and I POA. Thank you.

## 2016-08-10 NOTE — Telephone Encounter (Signed)
Left message for pt's dtr, [name] to make an appt with PCP to discuss the diuretic, that the PCP would probably want to see the pt prior to prescribing diuretic, that usually they were long-term medication and needed to be monitored by PCP.

## 2016-08-10 NOTE — Telephone Encounter (Signed)
No, just recommended follow up with PCP regarding b/l lower extremity edema and consider being placed on a diuretic.  Please contact patient and make appt with PCP for follow-up. PCP likely will not prescribe without seeing the patient first. Thanks, Dr. Amalia Hailey

## 2016-08-11 ENCOUNTER — Emergency Department (HOSPITAL_COMMUNITY): Payer: Medicare Other

## 2016-08-11 ENCOUNTER — Telehealth: Payer: Self-pay | Admitting: Podiatry

## 2016-08-11 ENCOUNTER — Inpatient Hospital Stay (HOSPITAL_COMMUNITY)
Admission: EM | Admit: 2016-08-11 | Discharge: 2016-08-17 | DRG: 871 | Disposition: A | Discharge status: Skilled Nursing Facility | Payer: Medicare Other | Attending: Internal Medicine | Admitting: Internal Medicine

## 2016-08-11 ENCOUNTER — Inpatient Hospital Stay (HOSPITAL_COMMUNITY): Payer: Medicare Other

## 2016-08-11 ENCOUNTER — Encounter (HOSPITAL_COMMUNITY): Payer: Self-pay | Admitting: Emergency Medicine

## 2016-08-11 DIAGNOSIS — I129 Hypertensive chronic kidney disease with stage 1 through stage 4 chronic kidney disease, or unspecified chronic kidney disease: Secondary | ICD-10-CM | POA: Diagnosis not present

## 2016-08-11 DIAGNOSIS — L0390 Cellulitis, unspecified: Secondary | ICD-10-CM | POA: Diagnosis not present

## 2016-08-11 DIAGNOSIS — D696 Thrombocytopenia, unspecified: Secondary | ICD-10-CM | POA: Diagnosis present

## 2016-08-11 DIAGNOSIS — N400 Enlarged prostate without lower urinary tract symptoms: Secondary | ICD-10-CM | POA: Diagnosis present

## 2016-08-11 DIAGNOSIS — D45 Polycythemia vera: Secondary | ICD-10-CM | POA: Diagnosis present

## 2016-08-11 DIAGNOSIS — R531 Weakness: Secondary | ICD-10-CM | POA: Diagnosis not present

## 2016-08-11 DIAGNOSIS — A021 Salmonella sepsis: Secondary | ICD-10-CM | POA: Diagnosis present

## 2016-08-11 DIAGNOSIS — F0390 Unspecified dementia without behavioral disturbance: Secondary | ICD-10-CM | POA: Diagnosis present

## 2016-08-11 DIAGNOSIS — E1122 Type 2 diabetes mellitus with diabetic chronic kidney disease: Secondary | ICD-10-CM | POA: Diagnosis present

## 2016-08-11 DIAGNOSIS — I872 Venous insufficiency (chronic) (peripheral): Secondary | ICD-10-CM | POA: Diagnosis not present

## 2016-08-11 DIAGNOSIS — Z79899 Other long term (current) drug therapy: Secondary | ICD-10-CM | POA: Diagnosis not present

## 2016-08-11 DIAGNOSIS — Z886 Allergy status to analgesic agent status: Secondary | ICD-10-CM

## 2016-08-11 DIAGNOSIS — J690 Pneumonitis due to inhalation of food and vomit: Secondary | ICD-10-CM | POA: Diagnosis present

## 2016-08-11 DIAGNOSIS — Z792 Long term (current) use of antibiotics: Secondary | ICD-10-CM

## 2016-08-11 DIAGNOSIS — L8990 Pressure ulcer of unspecified site, unspecified stage: Secondary | ICD-10-CM | POA: Diagnosis present

## 2016-08-11 DIAGNOSIS — R1310 Dysphagia, unspecified: Secondary | ICD-10-CM | POA: Diagnosis present

## 2016-08-11 DIAGNOSIS — R6520 Severe sepsis without septic shock: Secondary | ICD-10-CM | POA: Diagnosis present

## 2016-08-11 DIAGNOSIS — E11628 Type 2 diabetes mellitus with other skin complications: Secondary | ICD-10-CM | POA: Diagnosis not present

## 2016-08-11 DIAGNOSIS — I480 Paroxysmal atrial fibrillation: Secondary | ICD-10-CM | POA: Diagnosis present

## 2016-08-11 DIAGNOSIS — A419 Sepsis, unspecified organism: Secondary | ICD-10-CM

## 2016-08-11 DIAGNOSIS — N183 Chronic kidney disease, stage 3 unspecified: Secondary | ICD-10-CM | POA: Diagnosis present

## 2016-08-11 DIAGNOSIS — R488 Other symbolic dysfunctions: Secondary | ICD-10-CM | POA: Diagnosis not present

## 2016-08-11 DIAGNOSIS — G9340 Encephalopathy, unspecified: Secondary | ICD-10-CM | POA: Diagnosis not present

## 2016-08-11 DIAGNOSIS — N179 Acute kidney failure, unspecified: Secondary | ICD-10-CM | POA: Diagnosis present

## 2016-08-11 DIAGNOSIS — L03115 Cellulitis of right lower limb: Secondary | ICD-10-CM | POA: Diagnosis present

## 2016-08-11 DIAGNOSIS — M25579 Pain in unspecified ankle and joints of unspecified foot: Secondary | ICD-10-CM

## 2016-08-11 DIAGNOSIS — L03119 Cellulitis of unspecified part of limb: Secondary | ICD-10-CM

## 2016-08-11 DIAGNOSIS — A4150 Gram-negative sepsis, unspecified: Secondary | ICD-10-CM | POA: Diagnosis present

## 2016-08-11 DIAGNOSIS — Z881 Allergy status to other antibiotic agents status: Secondary | ICD-10-CM

## 2016-08-11 DIAGNOSIS — R1312 Dysphagia, oropharyngeal phase: Secondary | ICD-10-CM | POA: Diagnosis not present

## 2016-08-11 DIAGNOSIS — Z66 Do not resuscitate: Secondary | ICD-10-CM | POA: Diagnosis present

## 2016-08-11 DIAGNOSIS — I130 Hypertensive heart and chronic kidney disease with heart failure and stage 1 through stage 4 chronic kidney disease, or unspecified chronic kidney disease: Secondary | ICD-10-CM | POA: Diagnosis present

## 2016-08-11 DIAGNOSIS — R0602 Shortness of breath: Secondary | ICD-10-CM | POA: Diagnosis not present

## 2016-08-11 DIAGNOSIS — R7881 Bacteremia: Secondary | ICD-10-CM

## 2016-08-11 DIAGNOSIS — N281 Cyst of kidney, acquired: Secondary | ICD-10-CM | POA: Diagnosis not present

## 2016-08-11 DIAGNOSIS — Z882 Allergy status to sulfonamides status: Secondary | ICD-10-CM | POA: Diagnosis not present

## 2016-08-11 DIAGNOSIS — R262 Difficulty in walking, not elsewhere classified: Secondary | ICD-10-CM | POA: Diagnosis not present

## 2016-08-11 DIAGNOSIS — M79672 Pain in left foot: Secondary | ICD-10-CM | POA: Diagnosis not present

## 2016-08-11 DIAGNOSIS — M6281 Muscle weakness (generalized): Secondary | ICD-10-CM | POA: Diagnosis not present

## 2016-08-11 DIAGNOSIS — R509 Fever, unspecified: Secondary | ICD-10-CM

## 2016-08-11 DIAGNOSIS — R5081 Fever presenting with conditions classified elsewhere: Secondary | ICD-10-CM | POA: Diagnosis not present

## 2016-08-11 DIAGNOSIS — M79671 Pain in right foot: Secondary | ICD-10-CM

## 2016-08-11 DIAGNOSIS — R109 Unspecified abdominal pain: Secondary | ICD-10-CM

## 2016-08-11 DIAGNOSIS — J189 Pneumonia, unspecified organism: Secondary | ICD-10-CM | POA: Diagnosis not present

## 2016-08-11 DIAGNOSIS — Z880 Allergy status to penicillin: Secondary | ICD-10-CM

## 2016-08-11 DIAGNOSIS — E1129 Type 2 diabetes mellitus with other diabetic kidney complication: Secondary | ICD-10-CM | POA: Diagnosis present

## 2016-08-11 DIAGNOSIS — E1121 Type 2 diabetes mellitus with diabetic nephropathy: Secondary | ICD-10-CM | POA: Diagnosis not present

## 2016-08-11 DIAGNOSIS — Z9109 Other allergy status, other than to drugs and biological substances: Secondary | ICD-10-CM | POA: Diagnosis not present

## 2016-08-11 DIAGNOSIS — A029 Salmonella infection, unspecified: Secondary | ICD-10-CM | POA: Diagnosis not present

## 2016-08-11 DIAGNOSIS — K7689 Other specified diseases of liver: Secondary | ICD-10-CM | POA: Diagnosis not present

## 2016-08-11 DIAGNOSIS — J90 Pleural effusion, not elsewhere classified: Secondary | ICD-10-CM | POA: Diagnosis not present

## 2016-08-11 DIAGNOSIS — L89152 Pressure ulcer of sacral region, stage 2: Secondary | ICD-10-CM | POA: Diagnosis not present

## 2016-08-11 DIAGNOSIS — E785 Hyperlipidemia, unspecified: Secondary | ICD-10-CM | POA: Diagnosis present

## 2016-08-11 DIAGNOSIS — R404 Transient alteration of awareness: Secondary | ICD-10-CM | POA: Diagnosis not present

## 2016-08-11 DIAGNOSIS — M7989 Other specified soft tissue disorders: Secondary | ICD-10-CM | POA: Diagnosis not present

## 2016-08-11 DIAGNOSIS — K219 Gastro-esophageal reflux disease without esophagitis: Secondary | ICD-10-CM | POA: Diagnosis present

## 2016-08-11 DIAGNOSIS — R410 Disorientation, unspecified: Secondary | ICD-10-CM | POA: Diagnosis not present

## 2016-08-11 DIAGNOSIS — I2510 Atherosclerotic heart disease of native coronary artery without angina pectoris: Secondary | ICD-10-CM | POA: Diagnosis present

## 2016-08-11 HISTORY — DX: Sepsis, unspecified organism: A41.9

## 2016-08-11 HISTORY — DX: Cellulitis of unspecified part of limb: E11.628

## 2016-08-11 LAB — COMPREHENSIVE METABOLIC PANEL
ALK PHOS: 47 U/L (ref 38–126)
ALT: 24 U/L (ref 17–63)
ANION GAP: 10 (ref 5–15)
AST: 34 U/L (ref 15–41)
Albumin: 3.7 g/dL (ref 3.5–5.0)
BUN: 38 mg/dL — ABNORMAL HIGH (ref 6–20)
CALCIUM: 9.6 mg/dL (ref 8.9–10.3)
CO2: 22 mmol/L (ref 22–32)
Chloride: 107 mmol/L (ref 101–111)
Creatinine, Ser: 1.68 mg/dL — ABNORMAL HIGH (ref 0.61–1.24)
GFR calc Af Amer: 40 mL/min — ABNORMAL LOW (ref >60)
GFR calc non Af Amer: 34 mL/min — ABNORMAL LOW (ref >60)
GLUCOSE: 184 mg/dL — AB (ref 65–99)
POTASSIUM: 5 mmol/L (ref 3.5–5.1)
SODIUM: 139 mmol/L (ref 135–145)
Total Bilirubin: 2.2 mg/dL — ABNORMAL HIGH (ref 0.3–1.2)
Total Protein: 7 g/dL (ref 6.5–8.1)

## 2016-08-11 LAB — CBC WITH DIFFERENTIAL/PLATELET
Basophils Absolute: 0 10*3/uL (ref 0.0–0.1)
Basophils Relative: 0 %
Eosinophils Absolute: 0 10*3/uL (ref 0.0–0.7)
Eosinophils Relative: 0 %
HCT: 48.2 % (ref 39.0–52.0)
HEMOGLOBIN: 16.6 g/dL (ref 13.0–17.0)
LYMPHS ABS: 0.4 10*3/uL — AB (ref 0.7–4.0)
Lymphocytes Relative: 5 %
MCH: 30 pg (ref 26.0–34.0)
MCHC: 34.4 g/dL (ref 30.0–36.0)
MCV: 87.2 fL (ref 78.0–100.0)
MONOS PCT: 4 %
Monocytes Absolute: 0.3 10*3/uL (ref 0.1–1.0)
NEUTROS PCT: 91 %
Neutro Abs: 6.3 10*3/uL (ref 1.7–7.7)
Platelets: 81 10*3/uL — ABNORMAL LOW (ref 150–400)
RBC: 5.53 MIL/uL (ref 4.22–5.81)
RDW: 14.7 % (ref 11.5–15.5)
WBC: 7 10*3/uL (ref 4.0–10.5)

## 2016-08-11 LAB — I-STAT CG4 LACTIC ACID, ED
LACTIC ACID, VENOUS: 3.16 mmol/L — AB (ref 0.5–1.9)
Lactic Acid, Venous: 3.36 mmol/L (ref 0.5–1.9)

## 2016-08-11 LAB — PROTIME-INR
INR: 1.55
Prothrombin Time: 18.7 seconds — ABNORMAL HIGH (ref 11.4–15.2)

## 2016-08-11 MED ORDER — ACETAMINOPHEN 325 MG PO TABS
650.0000 mg | ORAL_TABLET | Freq: Four times a day (QID) | ORAL | Status: DC | PRN
  Administered 2016-08-11 – 2016-08-16 (×3): 650 mg via ORAL
  Filled 2016-08-11 (×3): qty 2

## 2016-08-11 MED ORDER — DOCUSATE SODIUM 100 MG PO CAPS
100.0000 mg | ORAL_CAPSULE | Freq: Every day | ORAL | Status: DC
  Administered 2016-08-12 – 2016-08-17 (×6): 100 mg via ORAL
  Filled 2016-08-11 (×6): qty 1

## 2016-08-11 MED ORDER — ISOSORBIDE MONONITRATE ER 60 MG PO TB24: 60.0000 mg | ORAL_TABLET | Freq: Every day | ORAL | Status: DC

## 2016-08-11 MED ORDER — LEVOFLOXACIN IN D5W 750 MG/150ML IV SOLN: 750.0000 mg | INTRAVENOUS | Status: DC

## 2016-08-11 MED ORDER — VANCOMYCIN HCL IN DEXTROSE 1-5 GM/200ML-% IV SOLN
1000.0000 mg | Freq: Once | INTRAVENOUS | Status: AC
  Administered 2016-08-11: 1000 mg via INTRAVENOUS
  Filled 2016-08-11: qty 200

## 2016-08-11 MED ORDER — FESOTERODINE FUMARATE ER 8 MG PO TB24
8.0000 mg | ORAL_TABLET | Freq: Every day | ORAL | Status: DC
Start: 2016-08-12 — End: 2016-08-17
  Administered 2016-08-12 – 2016-08-17 (×6): 8 mg via ORAL
  Filled 2016-08-11 (×6): qty 1

## 2016-08-11 MED ORDER — SODIUM CHLORIDE 0.9 % IV SOLN
INTRAVENOUS | Status: DC
  Administered 2016-08-11: 23:00:00 via INTRAVENOUS

## 2016-08-11 MED ORDER — SODIUM CHLORIDE 0.9 % IV BOLUS (SEPSIS)
1000.0000 mL | Freq: Once | INTRAVENOUS | Status: AC
  Administered 2016-08-12: 1000 mL via INTRAVENOUS

## 2016-08-11 MED ORDER — VANCOMYCIN HCL IN DEXTROSE 750-5 MG/150ML-% IV SOLN
750.0000 mg | INTRAVENOUS | Status: DC
  Administered 2016-08-12: 750 mg via INTRAVENOUS
  Filled 2016-08-11: qty 150

## 2016-08-11 MED ORDER — DEXTROSE 5 % IV SOLN
1.0000 g | Freq: Three times a day (TID) | INTRAVENOUS | Status: DC
  Administered 2016-08-12: 1 g via INTRAVENOUS
  Filled 2016-08-11 (×2): qty 1

## 2016-08-11 MED ORDER — MIRTAZAPINE 15 MG PO TABS
15.0000 mg | ORAL_TABLET | Freq: Every day | ORAL | Status: DC
  Administered 2016-08-12 – 2016-08-16 (×6): 15 mg via ORAL
  Filled 2016-08-11 (×4): qty 1
  Filled 2016-08-11: qty 0.5
  Filled 2016-08-11: qty 1
  Filled 2016-08-11: qty 0.5
  Filled 2016-08-11: qty 1

## 2016-08-11 MED ORDER — LEVOFLOXACIN IN D5W 750 MG/150ML IV SOLN
750.0000 mg | Freq: Once | INTRAVENOUS | Status: AC
  Administered 2016-08-11: 750 mg via INTRAVENOUS
  Filled 2016-08-11: qty 150

## 2016-08-11 MED ORDER — FINASTERIDE 5 MG PO TABS
5.0000 mg | ORAL_TABLET | Freq: Every day | ORAL | Status: DC
  Administered 2016-08-12 – 2016-08-16 (×6): 5 mg via ORAL
  Filled 2016-08-11 (×6): qty 1

## 2016-08-11 MED ORDER — TAMSULOSIN HCL 0.4 MG PO CAPS
0.4000 mg | ORAL_CAPSULE | Freq: Two times a day (BID) | ORAL | Status: DC
  Administered 2016-08-12 – 2016-08-17 (×12): 0.4 mg via ORAL
  Filled 2016-08-11 (×12): qty 1

## 2016-08-11 MED ORDER — METOPROLOL TARTRATE 25 MG PO TABS: 25.0000 mg | ORAL_TABLET | Freq: Two times a day (BID) | ORAL | Status: DC

## 2016-08-11 MED ORDER — ATORVASTATIN CALCIUM 10 MG PO TABS
10.0000 mg | ORAL_TABLET | Freq: Every day | ORAL | Status: DC
  Administered 2016-08-11 – 2016-08-16 (×6): 10 mg via ORAL
  Filled 2016-08-11 (×6): qty 1

## 2016-08-11 MED ORDER — DEXTROSE 5 % IV SOLN
2.0000 g | Freq: Once | INTRAVENOUS | Status: AC
Start: 2016-08-11 — End: 2016-08-11
  Administered 2016-08-11: 2 g via INTRAVENOUS
  Filled 2016-08-11: qty 2

## 2016-08-11 MED ORDER — SODIUM CHLORIDE 0.9 % IV BOLUS (SEPSIS)
500.0000 mL | Freq: Once | INTRAVENOUS | Status: AC
  Administered 2016-08-11: 500 mL via INTRAVENOUS

## 2016-08-11 MED ORDER — INSULIN ASPART 100 UNIT/ML ~~LOC~~ SOLN: 0.0000 [IU] | Freq: Three times a day (TID) | SUBCUTANEOUS | Status: DC

## 2016-08-11 MED ORDER — CLOPIDOGREL BISULFATE 75 MG PO TABS
75.0000 mg | ORAL_TABLET | Freq: Every day | ORAL | Status: DC
  Administered 2016-08-12 – 2016-08-16 (×5): 75 mg via ORAL
  Filled 2016-08-11 (×5): qty 1

## 2016-08-11 MED ORDER — LISINOPRIL 5 MG PO TABS
5.0000 mg | ORAL_TABLET | Freq: Every day | ORAL | Status: DC
  Filled 2016-08-11: qty 1

## 2016-08-11 MED ORDER — ONDANSETRON HCL 4 MG/2ML IJ SOLN
4.0000 mg | Freq: Four times a day (QID) | INTRAMUSCULAR | Status: DC | PRN
Start: 2016-08-11 — End: 2016-08-17

## 2016-08-11 NOTE — H&P (Signed)
History and Physical    Aaron Stephenson TMH:[id number] DOB: April 08, 1924 DOA: 08/11/2016  PCP: Vernie Shanks, MD   Patient coming from: Home via EMS  Chief Complaint: Weakness, confusion.  HPI: Aaron Stephenson is a 83 y.o. gentleman with a history of dementia (still lives at home with his wife), polycythemia vera, CKD 3, HTN, DM, and thrombocytopenia who presents to the ED via EMS for evaluation of increased weakness, confusion, and lethargy.  He has had subjective fevers and chills at home.  One caregiver has been sick (family not sure of nature of illness).  No chest pain or shortness of breath.  No cough or sputum production.  No light-headedness.  No bites.  He has new erythema in his right foot and leg; family notes that he saw the podiatrist to have his toenails clipped earlier this week.   ED Course: Temp 103 in the ED.  HR 105.  RR 20's.  Lactic acid level 3.36.  BUN 38 Creatinine 1.68.  Platelet count 81.  INR 1.55.  Chest xray shows retrocardiac consolidation and left pleural effusion.  He also appears to have a RLE cellulitis.    Patient received vanc, levaquin, and aztreonam in the ED.  He received a 500cc NS bolus.  Hospitalist asked to admit.  Review of Systems: Limited due to history of dementia.   Past Medical History:  Diagnosis Date  . Acute cystitis without hematuria   . Acute kidney injury superimposed on chronic kidney disease (HCC)    CKD 3  . BPH (benign prostatic hyperplasia)   . CKD (chronic kidney disease)   . Cognitive impairment   . Constipation    Unspecified  . Dementia   . DM (diabetes mellitus), type 2 ([place])   . Enterococcus UTI   . Esophagitis   . GERD (gastroesophageal reflux disease)   . Hiatal hernia   . HTN (hypertension)   . Hyperlipidemia   . Migraine   . Myeloproliferative disease ([place])   . OA (osteoarthritis)   . Physical deconditioning   . Polycythemia vera(238.4) 02/20/2011  . Protein calorie malnutrition ([place])   . Spinal  stenosis, lumbar   . Thrombocytopenia ([place])   . Toxic encephalopathy   . Vertigo   . Volume depletion     Past Surgical History:  Procedure Laterality Date  . LOWER LEG SOFT TISSUE TUMOR EXCISION    . TOTAL HIP ARTHROPLASTY  1993   right     reports that he has never smoked. He has never used smokeless tobacco. He reports that he drinks alcohol. He reports that he does not use drugs.  He is married; lives with his wife who also has dementia.  One of his daughters is POA.   Allergies  Allergen Reactions  . Penicillins Hives, Swelling and Anaphylaxis    Has patient had a PCN reaction causing immediate rash, facial/tongue/throat swelling, SOB or lightheadedness with hypotension: unknown Has patient had a PCN reaction causing severe rash involving mucus membranes or skin necrosis: unknown Has patient had a PCN reaction that required hospitalization: unknown Has patient had a PCN reaction occurring within the last 10 years: unknown If all of the above answers are "NO", then may proceed with Cephalosporin use. Pts family is unable to answer questions.  . Aspirin Other (See Comments)  . Ciprofloxacin Other (See Comments)    Joint swelling  . Macrodantin [Nitrofurantoin Macrocrystal] Other (See Comments)    Chest pains  . Other     "lubriderm  pain pill?"  . Oxycodone Other (See Comments)    Pt took OxyContin and tramadol together and almost "died"  . Sulfamethoxazole Other (See Comments)    Unknown - doesn't remember reaction  . Tramadol Other (See Comments)    Blank stare, no expression, becoming addictive  . Unisom [Doxylamine Succinate (Sleep)] Other (See Comments)    Hangover the next day  . Xanax [Alprazolam] Other (See Comments)    loopy    Family History  Problem Relation Age of Onset  . Diabetes Mother   . Hypertension Mother   . Cancer Father   . Diabetes Brother   . Kidney failure Brother      Prior to Admission medications   Medication Sig Start Date End  Date Taking? Authorizing Provider  atorvastatin (LIPITOR) 10 MG tablet Take 10 mg by mouth at bedtime.   Yes [provider]  clopidogrel (PLAVIX) 75 MG tablet Take 1 tablet (75 mg total) by mouth daily. 09/21/14  Yes Jacolyn Reedy, MD  docusate sodium (COLACE) 100 MG capsule Take 100 mg by mouth daily.   Yes [provider]  finasteride (PROSCAR) 5 MG tablet Take 5 mg by mouth at bedtime.  06/16/14  Yes [provider]  glipiZIDE (GLUCOTROL XL) 5 MG 24 hr tablet Take 5 mg by mouth daily.   Yes [provider]  isosorbide mononitrate (IMDUR) 60 MG 24 hr tablet Take 1 tablet (60 mg total) by mouth daily. 09/21/14  Yes Jacolyn Reedy, MD  lisinopril (PRINIVIL,ZESTRIL) 5 MG tablet Take 5 mg by mouth at bedtime.    Yes [provider]  metoprolol tartrate (LOPRESSOR) 50 MG tablet Take 25 mg by mouth 2 (two) times daily. 06/01/16  Yes [provider]  mirtazapine (REMERON) 15 MG tablet Take 15 mg by mouth at bedtime.   Yes [provider]  tamsulosin (FLOMAX) 0.4 MG CAPS capsule Take 0.4 mg by mouth 2 (two) times daily.    Yes [provider]  tolterodine (DETROL LA) 4 MG 24 hr capsule Take 4 mg by mouth at bedtime.   Yes [provider]  nitroGLYCERIN (NITROSTAT) 0.4 MG SL tablet Place 1 tablet (0.4 mg total) under the tongue every 5 (five) minutes x 3 doses as needed for chest pain. 09/21/14   Jacolyn Reedy, MD    Physical Exam: Vitals:   08/11/16 1747 08/11/16 1830 08/11/16 2005 08/11/16 2015  BP: 134/79  108/60   Pulse: 99  (!) 105 83  Resp: (!) 28  (!) 29 (!) 27  Temp: (!) 100.4 F (38 C)     TempSrc: Oral     SpO2: 94%  92% 96%  Weight:  65.3 kg (144 lb)    Height:  5\' 2"  (1.575 m)        Constitutional: NAD, ill appearing, tachypneic Vitals:   08/11/16 1747 08/11/16 1830 08/11/16 2005 08/11/16 2015  BP: 134/79  108/60   Pulse: 99  (!) 105 83  Resp: (!) 28  (!) 29 (!) 27  Temp: (!) 100.4 F (38  C)     TempSrc: Oral     SpO2: 94%  92% 96%  Weight:  65.3 kg (144 lb)    Height:  5\' 2"  (1.575 m)     Eyes: PERRL, lids and conjunctivae normal ENMT: Mucous membranes are dry. Posterior pharynx clear of any exudate or lesions. Neck: normal appearance, supple, no masses Respiratory: clear to auscultation bilaterally, no wheezing, no crackles. Breaths are  shallow. No accessory muscle use.  Cardiovascular: Normal rate, regular rhythm, no murmurs / rubs / gallops. Bilateral lower extremity edema.  2+ pedal pulses. GI: abdomen is soft and compressible.  No distention.  No tenderness.  Bowel sounds are present. Musculoskeletal:  No joint deformity in upper and lower extremities. Good ROM, no contractures. Normal muscle tone.  Skin: very hot to touch but he is not clammy.  Right foot is warm compared to left.  Right foot has extensive erythema and bruising but no ulcer.  No focal induration or abscess.  Erythema tracking up the medial aspect of right leg. Neurologic: No focal deficits. Psychiatric: Alert and oriented to person and place.  Agitated.  Insight and judgment impaired due to dementia. GU: Right inguinal hernia noted.  No TTP.   Labs on Admission: I have personally reviewed following labs and imaging studies  CBC:  Recent Labs Lab 08/11/16 1809  WBC 7.0  NEUTROABS 6.3  HGB 16.6  HCT 48.2  MCV 87.2  PLT 81*   Basic Metabolic Panel:  Recent Labs Lab 08/11/16 1809  NA 139  K 5.0  CL 107  CO2 22  GLUCOSE 184*  BUN 38*  CREATININE 1.68*  CALCIUM 9.6   GFR: Estimated Creatinine Clearance: 23 mL/min (A) (by C-G formula based on SCr of 1.68 mg/dL (H)). Liver Function Tests:  Recent Labs Lab 08/11/16 1809  AST 34  ALT 24  ALKPHOS 47  BILITOT 2.2*  PROT 7.0  ALBUMIN 3.7   Coagulation Profile:  Recent Labs Lab 08/11/16 1809  INR 1.55   Urine analysis:    Component Value Date/Time   COLORURINE AMBER (A) 06/27/2016 1940   APPEARANCEUR CLEAR 06/27/2016  1940   LABSPEC 1.017 06/27/2016 1940   PHURINE 5.5 06/27/2016 1940   GLUCOSEU NEGATIVE 06/27/2016 1940   HGBUR NEGATIVE 06/27/2016 1940   BILIRUBINUR NEGATIVE 06/27/2016 1940   KETONESUR 15 (A) 06/27/2016 1940   PROTEINUR NEGATIVE 06/27/2016 1940   UROBILINOGEN 1.0 09/19/2014 1610   NITRITE NEGATIVE 06/27/2016 1940   LEUKOCYTESUR NEGATIVE 06/27/2016 1940   Sepsis Labs:  Lactic acid level 3.36, repeat 3.1  Radiological Exams on Admission: Dg Chest 2 View  Result Date: 08/11/2016 CLINICAL DATA:  Decreased mobility and worsening confusion for 2 days. EXAM: CHEST  2 VIEW COMPARISON:  Chest radiograph Jun 27, 2016 FINDINGS: The cardiac silhouette is moderately enlarged. Mediastinal silhouette is nonsuspicious, calcified aortic knob. Pulmonary vascular congestion. Retrocardiac consolidation with small LEFT pleural effusion. Strandy densities RIGHT lung base. Trachea deviated to the LEFT within the neck, new from prior radiograph which is likely positional. No pneumothorax. Severe bilateral glenohumeral osteoarthrosis. IMPRESSION: Retrocardiac consolidation and small LEFT pleural effusion. Followup PA and lateral chest X-ray is recommended in 3-4 weeks following trial of antibiotic therapy to ensure resolution and exclude underlying malignancy. Stable cardiomegaly and pulmonary vascular congestion. Electronically Signed   By: Elon Alas M.D.   On: 08/11/2016 18:43   Ct Head Wo Contrast  Result Date: 08/11/2016 CLINICAL DATA:  Unable to ambulate.  Confusion. EXAM: CT HEAD WITHOUT CONTRAST TECHNIQUE: Contiguous axial images were obtained from the base of the skull through the vertex without intravenous contrast. COMPARISON:  Jun 27, 2016 FINDINGS: Brain: Evaluation is somewhat limited due to patient motion. No subdural, epidural, or subarachnoid hemorrhage. Cerebellum, brainstem, and basal cisterns are normal. No mass effect or midline shift. Prominence of sulci and ventricles consistent with  volume loss. No acute cortical ischemia or infarct is identified. Vascular: No hyperdense vessel  or unexpected calcification. Skull: Normal. Negative for fracture or focal lesion. Sinuses/Orbits: No acute finding. Other: None. IMPRESSION: The study is limited by motion. No acute abnormalities are identified. Electronically Signed   By: Dorise Bullion III M.D   On: 08/11/2016 20:56    EKG: Independently reviewed. NSR with PVC.  Low voltage.  No acute ST elevation.  Assessment/Plan Principal Problem:   Sepsis ([place]) Active Problems:   Type 2 diabetes mellitus with renal manifestations (HCC)   Kidney disease, chronic, stage III (GFR 30-59 ml/min)   Chronic venous insufficiency   Cellulitis in diabetic foot ([place])   HCAP (healthcare-associated pneumonia)      Sepsis secondary to RLE cellulitis and HCAP. --Continue vanc, levaquin, aztreonam --Blood cultures pending --Check procalcitonin level --Give additional 1L NS bolus now  CKD 3 --Appears stable  HTN --Metoprolol, lisinopril, Imdur  BPH --flomax, finastride       DVT prophylaxis: SCDs (thrombocytopenia) Code Status: DNR Family Communication: Two daughters and one son-in-law present in the ED at time of admission. Disposition Plan: To be determined. Consults called: NONE Admission status: inpatient, stepdown unit.  I expect this patient will need inpatient services for greater than two midnights.   TIME SPENT: 70 minutes   Eber Jones MD [facility] Pager [phone]  If 7PM-7AM, please contact night-coverage [email or web address] Password [facility]  08/11/2016, 11:07 PM

## 2016-08-11 NOTE — Telephone Encounter (Signed)
Hello Marcy Siren, this is Designer, industrial/product at Dr. Geryl Councilman office at [facility] at [place]. Mr. Mullane daughter keeps calling about a medication that Dr. Amalia Hailey wanted the pt to go on. We do not have any notes from your office. If you could either please call me at [phone] or send the office visit note from Monday 25 June with the medication information on it via fax to me at [phone]. Thank you.

## 2016-08-11 NOTE — ED Provider Notes (Signed)
[facility] DEPT Provider Note   CSN: [id number] Arrival date & time: 08/11/16  1728     History   Chief Complaint Chief Complaint  Patient presents with  . Weakness  . Altered Mental Status    HPI Aaron Stephenson is a 83 y.o. male.  Patient brought in by EMS from home. Patient has a history of dementia. Patient's had increased weakness in 2 week to stand since 2 in the morning. They noticed some redness to the right leg and swelling to both legs. Questionable fevers at home. No nausea no vomiting. No recent falls. Patient's primary care doctor is Dr. Jacelyn Grip.      Past Medical History:  Diagnosis Date  . Acute cystitis without hematuria   . Acute kidney injury superimposed on chronic kidney disease (HCC)    CKD 3  . BPH (benign prostatic hyperplasia)   . CKD (chronic kidney disease)   . Cognitive impairment   . Constipation    Unspecified  . Dementia   . DM (diabetes mellitus), type 2 ([place])   . Enterococcus UTI   . Esophagitis   . GERD (gastroesophageal reflux disease)   . Hiatal hernia   . HTN (hypertension)   . Hyperlipidemia   . Migraine   . Myeloproliferative disease ([place])   . OA (osteoarthritis)   . Physical deconditioning   . Polycythemia vera(238.4) 02/20/2011  . Protein calorie malnutrition ([place])   . Spinal stenosis, lumbar   . Thrombocytopenia ([place])   . Toxic encephalopathy   . Vertigo   . Volume depletion     Patient Active Problem List   Diagnosis Date Noted  . Bilateral knee effusions 05/01/2016  . Primary osteoarthritis of both knees 05/01/2016  . GERD (gastroesophageal reflux disease) 07/26/2015  . Toxic encephalopathy 07/21/2015  . Kidney disease, chronic, stage III (GFR 30-59 ml/min) 09/21/2014  . Abnormal cardiovascular function study 09/21/2014  . BPH (benign prostatic hypertrophy) 09/21/2014  . Hyperlipidemia   . Chronic venous insufficiency   . NSTEMI (non-ST elevated myocardial infarction) ([place]) 09/19/2014  . Type 2 diabetes  mellitus with renal manifestations ([place]) 09/19/2014  . Polycythemia vera ([place]) 02/20/2011    Past Surgical History:  Procedure Laterality Date  . LOWER LEG SOFT TISSUE TUMOR EXCISION    . TOTAL HIP ARTHROPLASTY  1993   right       Home Medications    Prior to Admission medications   Medication Sig Start Date End Date Taking? Authorizing Provider  atorvastatin (LIPITOR) 10 MG tablet Take 10 mg by mouth at bedtime.   Yes [provider]  clopidogrel (PLAVIX) 75 MG tablet Take 1 tablet (75 mg total) by mouth daily. 09/21/14  Yes Jacolyn Reedy, MD  docusate sodium (COLACE) 100 MG capsule Take 100 mg by mouth daily.   Yes [provider]  finasteride (PROSCAR) 5 MG tablet Take 5 mg by mouth at bedtime.  06/16/14  Yes [provider]  glipiZIDE (GLUCOTROL XL) 5 MG 24 hr tablet Take 5 mg by mouth daily.   Yes [provider]  isosorbide mononitrate (IMDUR) 60 MG 24 hr tablet Take 1 tablet (60 mg total) by mouth daily. 09/21/14  Yes Jacolyn Reedy, MD  lisinopril (PRINIVIL,ZESTRIL) 5 MG tablet Take 5 mg by mouth at bedtime.    Yes [provider]  metoprolol tartrate (LOPRESSOR) 50 MG tablet Take 25 mg by mouth 2 (two) times daily. 06/01/16  Yes [provider]  mirtazapine (REMERON) 15 MG tablet Take  15 mg by mouth at bedtime.   Yes [provider]  tamsulosin (FLOMAX) 0.4 MG CAPS capsule Take 0.4 mg by mouth 2 (two) times daily.    Yes [provider]  tolterodine (DETROL LA) 4 MG 24 hr capsule Take 4 mg by mouth at bedtime.   Yes [provider]  nitroGLYCERIN (NITROSTAT) 0.4 MG SL tablet Place 1 tablet (0.4 mg total) under the tongue every 5 (five) minutes x 3 doses as needed for chest pain. 09/21/14   Jacolyn Reedy, MD    Family History Family History  Problem Relation Age of Onset  . Diabetes Mother   . Hypertension Mother   . Cancer Father   . Diabetes Brother   . Kidney failure Brother      Social History Social History  Substance Use Topics  . Smoking status: Never Smoker  . Smokeless tobacco: Never Used  . Alcohol use Yes     Comment: rarely     Allergies   Penicillins; Aspirin; Ciprofloxacin; Macrodantin [nitrofurantoin macrocrystal]; Other; Oxycodone; Sulfamethoxazole; Tramadol; Unisom [doxylamine succinate (sleep)]; and Xanax [alprazolam]   Review of Systems Review of Systems  Constitutional: Positive for fatigue and fever.  HENT: Negative for congestion.   Eyes: Negative for redness.  Respiratory: Negative for shortness of breath.   Cardiovascular: Positive for leg swelling. Negative for chest pain.  Gastrointestinal: Negative for abdominal pain, nausea and vomiting.  Genitourinary: Negative for hematuria.  Musculoskeletal: Negative for neck stiffness.  Neurological: Negative for headaches.  Hematological: Does not bruise/bleed easily.  Psychiatric/Behavioral: Positive for confusion.     Physical Exam Updated Vital Signs BP 108/60   Pulse (!) 105   Temp (!) 100.4 F (38 C) (Oral)   Resp (!) 29   Ht 1.575 m ('5\' 2"' )   Wt 65.3 kg (144 lb)   SpO2 92%   BMI 26.34 kg/m   Physical Exam  Constitutional: He appears well-developed and well-nourished. No distress.  HENT:  Head: Normocephalic and atraumatic.  Mucous membranes slightly dry  Eyes: Conjunctivae and EOM are normal. Pupils are equal, round, and reactive to light.  Neck: Normal range of motion. Neck supple.  Cardiovascular: Regular rhythm and normal heart sounds.   Tachycardic  Pulmonary/Chest: Effort normal and breath sounds normal. No respiratory distress.  Abdominal: Soft. Bowel sounds are normal. There is no tenderness.  Musculoskeletal: Normal range of motion. He exhibits edema.  Bilateral lower extremity edema. Some slight redness to the right leg.  Neurological: He is alert. No cranial nerve deficit or sensory deficit. He exhibits normal muscle tone. Coordination normal.   Patient with baseline dementia.  Skin: Skin is warm.  Nursing note and vitals reviewed.    ED Treatments / Results  Labs (all labs ordered are listed, but only abnormal results are displayed) Labs Reviewed  COMPREHENSIVE METABOLIC PANEL - Abnormal; Notable for the following:       Result Value   Glucose, Bld 184 (*)    BUN 38 (*)    Creatinine, Ser 1.68 (*)    Total Bilirubin 2.2 (*)    GFR calc non Af Amer 34 (*)    GFR calc Af Amer 40 (*)    All other components within normal limits  CBC WITH DIFFERENTIAL/PLATELET - Abnormal; Notable for the following:    Platelets 81 (*)    Lymphs Abs 0.4 (*)    All other components within normal limits  PROTIME-INR - Abnormal; Notable for the following:    Prothrombin Time  18.7 (*)    All other components within normal limits  I-STAT CG4 LACTIC ACID, ED - Abnormal; Notable for the following:    Lactic Acid, Venous 3.36 (*)    All other components within normal limits  I-STAT CG4 LACTIC ACID, ED - Abnormal; Notable for the following:    Lactic Acid, Venous 3.16 (*)    All other components within normal limits  CULTURE, BLOOD (ROUTINE X 2)  CULTURE, BLOOD (ROUTINE X 2)  URINALYSIS, ROUTINE W REFLEX MICROSCOPIC  CREATININE, SERUM  I-STAT CG4 LACTIC ACID, ED    EKG  EKG Interpretation  Date/Time:  Friday August 11 2016 17:52:59 EDT Ventricular Rate:  92 PR Interval:    QRS Duration: 112 QT Interval:  374 QTC Calculation: 466 R Axis:   151 Text Interpretation:  Sinus rhythm Ventricular premature complex Prolonged PR interval IRBBB and LPFB Anterior infarct, old Interpretation limited secondary to artifact No significant change since last tracing Confirmed by Fredia Sorrow [phone]) on 08/11/2016 6:43:28 PM       Radiology Dg Chest 2 View  Result Date: 08/11/2016 CLINICAL DATA:  Decreased mobility and worsening confusion for 2 days. EXAM: CHEST  2 VIEW COMPARISON:  Chest radiograph Jun 27, 2016 FINDINGS: The cardiac silhouette  is moderately enlarged. Mediastinal silhouette is nonsuspicious, calcified aortic knob. Pulmonary vascular congestion. Retrocardiac consolidation with small LEFT pleural effusion. Strandy densities RIGHT lung base. Trachea deviated to the LEFT within the neck, new from prior radiograph which is likely positional. No pneumothorax. Severe bilateral glenohumeral osteoarthrosis. IMPRESSION: Retrocardiac consolidation and small LEFT pleural effusion. Followup PA and lateral chest X-ray is recommended in 3-4 weeks following trial of antibiotic therapy to ensure resolution and exclude underlying malignancy. Stable cardiomegaly and pulmonary vascular congestion. Electronically Signed   By: Elon Alas M.D.   On: 08/11/2016 18:43   Ct Head Wo Contrast  Result Date: 08/11/2016 CLINICAL DATA:  Unable to ambulate.  Confusion. EXAM: CT HEAD WITHOUT CONTRAST TECHNIQUE: Contiguous axial images were obtained from the base of the skull through the vertex without intravenous contrast. COMPARISON:  Jun 27, 2016 FINDINGS: Brain: Evaluation is somewhat limited due to patient motion. No subdural, epidural, or subarachnoid hemorrhage. Cerebellum, brainstem, and basal cisterns are normal. No mass effect or midline shift. Prominence of sulci and ventricles consistent with volume loss. No acute cortical ischemia or infarct is identified. Vascular: No hyperdense vessel or unexpected calcification. Skull: Normal. Negative for fracture or focal lesion. Sinuses/Orbits: No acute finding. Other: None. IMPRESSION: The study is limited by motion. No acute abnormalities are identified. Electronically Signed   By: Dorise Bullion III M.D   On: 08/11/2016 20:56    Procedures Procedures (including critical care time)  CRITICAL CARE Performed by: Fredia Sorrow Total critical care time: 30 minutes Critical care time was exclusive of separately billable procedures and treating other patients. Critical care was necessary to treat or  prevent imminent or life-threatening deterioration. Critical care was time spent personally by me on the following activities: development of treatment plan with patient and/or surrogate as well as nursing, discussions with consultants, evaluation of patient's response to treatment, examination of patient, obtaining history from patient or surrogate, ordering and performing treatments and interventions, ordering and review of laboratory studies, ordering and review of radiographic studies, pulse oximetry and re-evaluation of patient's condition.  Medications Ordered in ED Medications  vancomycin (VANCOCIN) IVPB 750 mg/150 ml premix (not administered)  levofloxacin (LEVAQUIN) IVPB 750 mg (not administered)  aztreonam (AZACTAM) 1 g in dextrose 5 %  50 mL IVPB (not administered)  0.9 %  sodium chloride infusion (not administered)  levofloxacin (LEVAQUIN) IVPB 750 mg (0 mg Intravenous Stopped 08/11/16 2106)  aztreonam (AZACTAM) 2 g in dextrose 5 % 50 mL IVPB (0 g Intravenous Stopped 08/11/16 1926)  vancomycin (VANCOCIN) IVPB 1000 mg/200 mL premix (0 mg Intravenous Stopped 08/11/16 2106)  sodium chloride 0.9 % bolus 500 mL (500 mLs Intravenous New Bag/Given 08/11/16 1915)     Initial Impression / Assessment and Plan / ED Course  I have reviewed the triage vital signs and the nursing notes.  Pertinent labs & imaging results that were available during my care of the patient were reviewed by me and considered in my medical decision making (see chart for details).    Patient on presentation met sepsis criteria based on tachycardia increased respiratory rate and a temp of 100.4. Patient's white blood cell count not elevated like the acid was less than 4.  Chest x-ray consistent with a left-sided pneumonia retrocardiac left pleural effusion. Pulmonary mass is not been completely ruled out. Patient apparently became mildly weak and a little bit confused today and had fever. Apparently was fine yesterday.  Patient is a DO NOT RESUSCITATE. Patient does have baseline dementia. Head CT negative for any acute findings.  Patient started on broad-spectrum antibiotics. Patient has a penicillin allergy so pain allergy antibiotics were ordered. Patient has a severe allergy to penicillin. Patient also had Cipro listed as an allergy but it just created joint swellings or did not seem real severe. Suite with the Levaquin for him. He has done fine.  Patient did not meet criteria for the full fluid challenge. Since his white count was not up lactic acid was in over 4 he has not dropped his blood pressures. Most recent vital signs following the fluid is received so far 124/61 room air sats 99% and heart rate is less than 100.   Family informed. Patient will be admitted to step down initially.   Final Clinical Impressions(s) / ED Diagnoses   Final diagnoses:  Sepsis, due to unspecified organism ([place])  HCAP (healthcare-associated pneumonia)    New Prescriptions New Prescriptions   No medications on file     Fredia Sorrow, MD 08/11/16 2153

## 2016-08-11 NOTE — Progress Notes (Signed)
Pharmacy Antibiotic Note  Aaron Stephenson is a 83 y.o. male presented to the ED via EMS on 08/11/2016 with c/o AMS, weakness and red/swollen right leg. To start broad abx with vancomycin, levaquin (Dr. Rogene Houston is aware of pt's allergy to cipro), and aztreonam for suspected sepsis.   Plan: - vancomycin 1000 mg IV x1, then 750 mg IV q24h - levaquin 750 mg IV q48h (MD aware of cipro allergy) - aztreonam 2gm IV , 1gm IV q8h - daily scr  ______________________     Temp (24hrs), Avg:100.4 F (38 C), Min:100.4 F (38 C), Max:100.4 F (38 C)   Recent Labs Lab 08/11/16 1809  LATICACIDVEN 3.36*    CrCl cannot be calculated (Patient's most recent lab result is older than the maximum 21 days allowed.).    Allergies  Allergen Reactions  . Penicillins Hives, Swelling and Anaphylaxis    Has patient had a PCN reaction causing immediate rash, facial/tongue/throat swelling, SOB or lightheadedness with hypotension: unknown Has patient had a PCN reaction causing severe rash involving mucus membranes or skin necrosis: unknown Has patient had a PCN reaction that required hospitalization: unknown Has patient had a PCN reaction occurring within the last 10 years: unknown If all of the above answers are "NO", then may proceed with Cephalosporin use. Pts family is unable to answer questions.  . Aspirin Other (See Comments)  . Ciprofloxacin Other (See Comments)    Joint swelling  . Macrodantin [Nitrofurantoin Macrocrystal] Other (See Comments)    Chest pains  . Other     "lubriderm pain pill?"  . Oxycodone Other (See Comments)    Pt took OxyContin and tramadol together and almost "died"  . Sulfamethoxazole Other (See Comments)    Unknown - doesn't remember reaction  . Tramadol Other (See Comments)    Blank stare, no expression, becoming addictive  . Unisom [Doxylamine Succinate (Sleep)] Other (See Comments)    Hangover the next day  . Xanax [Alprazolam] Other (See Comments)    loopy     Thank you for allowing pharmacy to be a part of this patient's care.  Lynelle Doctor 08/11/2016 6:30 PM

## 2016-08-11 NOTE — ED Triage Notes (Addendum)
Pt from home via EMS initially requesting help from EMS to ambulate to restroom. When EMS arrived, pt was unable to ambulate or stand on his own. Per EMS wife states pt has been sitting unable to stand since either 0200 or 0400. Pt is also confused per EMS since approximately 0200. Pt also has warmth and redness to right leg. Pt is not sure how long his leg has been swollen and red

## 2016-08-11 NOTE — ED Notes (Signed)
Pt to CT

## 2016-08-11 NOTE — ED Notes (Signed)
Pt. Documented in error Notify Physican if pt is possible Sepsis patient.

## 2016-08-11 NOTE — Clinical Social Work Note (Signed)
Clinical Social Work Assessment  Patient Details  Name: Aaron Stephenson MRN: [id number] Date of Birth: 07/27/1924  Date of referral:  08/11/16               Reason for consult:  Facility Placement                Permission sought to share information with:  Family Supports Permission granted to share information::  Yes, Verbal Permission Granted  Name::        Agency::     Relationship::     Contact Information:     Housing/Transportation Living arrangements for the past 2 months:  Single Family Home Source of Information:  Patient Patient Interpreter Needed:  None Criminal Activity/Legal Involvement Pertinent to Current Situation/Hospitalization:    Significant Relationships:  Adult Children, Spouse Lives with:  Spouse Do you feel safe going back to the place where you live?  Yes Need for family participation in patient care:  Yes (Comment)  Care giving concerns:  None listed by pt/family   Social Worker assessment / plan:  [name] met with pt and confirmed pt's family's plan to be discharged to SNF to live if PT recommends.  [name] provided active listening and validated pt's family's concerns.  Pt's familyv gave [name] Dept permission to complete FL-2 and send referrals out to SNF facilities via the hub if PT recommends to [place] point area SNF's.  Pt has been living independently with his wife prior to being admitted to [facility].  Employment status:  Retired Health visitor, Managed Care PT Recommendations:  Not assessed at this time Information / Referral to community resources:     Patient/Family's Response to care:  Patient alert and oriented.  Patient and family agreeable to plan.  Pt's wife and daughters Carollee Herter at [phone] and, Hoyle Sauer and Lupita Dawn  supportive and strongly involved in pt.'s care.  Pt.'s family pleasant and appreciated [name] intervention.    Patient/Family's Understanding of and Emotional Response to Diagnosis, Current Treatment, and  Prognosis:  Still assessing  Emotional Assessment Appearance:    Attitude/Demeanor/Rapport:    Affect (typically observed):  Accepting, Adaptable, Calm, Pleasant Orientation:  Oriented to Self, Oriented to Place, Oriented to Situation, Oriented to  Time Alcohol / Substance use:    Psych involvement (Current and /or in the community):     Discharge Needs  Concerns to be addressed:  No discharge needs identified Readmission within the last 30 days:  No Current discharge risk:  None Barriers to Discharge:  No Barriers Identified   Claudine Mouton, LCSWA 08/11/2016, 10:04 PM

## 2016-08-11 NOTE — Telephone Encounter (Signed)
Faxed 08/07/2016 office notes to Dr. Jacelyn Grip.

## 2016-08-11 NOTE — ED Notes (Signed)
Report given to 2W

## 2016-08-11 NOTE — ED Notes (Signed)
Bed: WA08 Expected date:  Expected time:  Means of arrival:  Comments: EMS- elderly, weakness

## 2016-08-11 NOTE — ED Notes (Signed)
Pt. CG4 Lactic acid results 3.16, EDP,Zackowki,MD., notified and RN,Mingia made aware.

## 2016-08-12 ENCOUNTER — Inpatient Hospital Stay (HOSPITAL_COMMUNITY): Payer: Medicare Other

## 2016-08-12 LAB — PROCALCITONIN: PROCALCITONIN: 32.96 ng/mL

## 2016-08-12 LAB — BLOOD CULTURE ID PANEL (REFLEXED)
ACINETOBACTER BAUMANNII: NOT DETECTED
CANDIDA ALBICANS: NOT DETECTED
CANDIDA GLABRATA: NOT DETECTED
CANDIDA KRUSEI: NOT DETECTED
CANDIDA PARAPSILOSIS: NOT DETECTED
Candida tropicalis: NOT DETECTED
Carbapenem resistance: NOT DETECTED
ENTEROBACTER CLOACAE COMPLEX: NOT DETECTED
ESCHERICHIA COLI: NOT DETECTED
Enterobacteriaceae species: DETECTED — AB
Enterococcus species: NOT DETECTED
Haemophilus influenzae: NOT DETECTED
KLEBSIELLA OXYTOCA: NOT DETECTED
KLEBSIELLA PNEUMONIAE: NOT DETECTED
Listeria monocytogenes: NOT DETECTED
Neisseria meningitidis: NOT DETECTED
PROTEUS SPECIES: NOT DETECTED
Pseudomonas aeruginosa: NOT DETECTED
STREPTOCOCCUS PYOGENES: NOT DETECTED
Serratia marcescens: NOT DETECTED
Staphylococcus aureus (BCID): NOT DETECTED
Staphylococcus species: NOT DETECTED
Streptococcus agalactiae: NOT DETECTED
Streptococcus pneumoniae: NOT DETECTED
Streptococcus species: NOT DETECTED

## 2016-08-12 LAB — GLUCOSE, CAPILLARY
GLUCOSE-CAPILLARY: 111 mg/dL — AB (ref 65–99)
GLUCOSE-CAPILLARY: 56 mg/dL — AB (ref 65–99)
GLUCOSE-CAPILLARY: 61 mg/dL — AB (ref 65–99)
GLUCOSE-CAPILLARY: 63 mg/dL — AB (ref 65–99)
GLUCOSE-CAPILLARY: 67 mg/dL (ref 65–99)
GLUCOSE-CAPILLARY: 69 mg/dL (ref 65–99)
GLUCOSE-CAPILLARY: 85 mg/dL (ref 65–99)
Glucose-Capillary: 65 mg/dL (ref 65–99)
Glucose-Capillary: 81 mg/dL (ref 65–99)
Glucose-Capillary: 121 mg/dL — ABNORMAL HIGH (ref 65–99)
Glucose-Capillary: 78 mg/dL (ref 65–99)

## 2016-08-12 LAB — BASIC METABOLIC PANEL
Anion gap: 8 (ref 5–15)
BUN: 39 mg/dL — ABNORMAL HIGH (ref 6–20)
CALCIUM: 8.4 mg/dL — AB (ref 8.9–10.3)
CHLORIDE: 108 mmol/L (ref 101–111)
CO2: 20 mmol/L — ABNORMAL LOW (ref 22–32)
CREATININE: 1.64 mg/dL — AB (ref 0.61–1.24)
GFR, EST AFRICAN AMERICAN: 41 mL/min — AB (ref >60)
GFR, EST NON AFRICAN AMERICAN: 35 mL/min — AB (ref >60)
Glucose, Bld: 70 mg/dL (ref 65–99)
Potassium: 4.3 mmol/L (ref 3.5–5.1)
SODIUM: 136 mmol/L (ref 135–145)

## 2016-08-12 LAB — URINALYSIS, ROUTINE W REFLEX MICROSCOPIC
BILIRUBIN URINE: NEGATIVE
GLUCOSE, UA: NEGATIVE mg/dL
KETONES UR: NEGATIVE mg/dL
LEUKOCYTES UA: NEGATIVE
NITRITE: NEGATIVE
PH: 5 (ref 5.0–8.0)
Protein, ur: NEGATIVE mg/dL
SPECIFIC GRAVITY, URINE: 1.013 (ref 1.005–1.030)

## 2016-08-12 LAB — CBC
HCT: 42.2 % (ref 39.0–52.0)
HEMOGLOBIN: 14.3 g/dL (ref 13.0–17.0)
MCH: 29.5 pg (ref 26.0–34.0)
MCHC: 33.9 g/dL (ref 30.0–36.0)
MCV: 87 fL (ref 78.0–100.0)
Platelets: 73 10*3/uL — ABNORMAL LOW (ref 150–400)
RBC: 4.85 MIL/uL (ref 4.22–5.81)
RDW: 14.8 % (ref 11.5–15.5)
WBC: 6.6 10*3/uL (ref 4.0–10.5)

## 2016-08-12 LAB — LACTIC ACID, PLASMA
LACTIC ACID, VENOUS: 2.4 mmol/L — AB (ref 0.5–1.9)
Lactic Acid, Venous: 1.9 mmol/L (ref 0.5–1.9)
Lactic Acid, Venous: 2.7 mmol/L (ref 0.5–1.9)

## 2016-08-12 LAB — MRSA PCR SCREENING: MRSA by PCR: NEGATIVE

## 2016-08-12 LAB — CK: Total CK: 230 U/L (ref 49–397)

## 2016-08-12 LAB — STREP PNEUMONIAE URINARY ANTIGEN: Strep Pneumo Urinary Antigen: NEGATIVE

## 2016-08-12 MED ORDER — SODIUM CHLORIDE 0.9 % IV SOLN
500.0000 mg | Freq: Two times a day (BID) | INTRAVENOUS | Status: DC
  Administered 2016-08-12 – 2016-08-14 (×4): 500 mg via INTRAVENOUS
  Filled 2016-08-12 (×5): qty 0.5

## 2016-08-12 MED ORDER — SODIUM CHLORIDE 0.9 % IV BOLUS (SEPSIS)
500.0000 mL | Freq: Once | INTRAVENOUS | Status: AC
  Administered 2016-08-12: 500 mL via INTRAVENOUS

## 2016-08-12 MED ORDER — DEXTROSE 50 % IV SOLN
INTRAVENOUS | Status: AC
  Filled 2016-08-12: qty 50

## 2016-08-12 MED ORDER — GLUCAGON HCL RDNA (DIAGNOSTIC) 1 MG IJ SOLR
1.0000 mg | Freq: Once | INTRAMUSCULAR | Status: AC
  Administered 2016-08-12: 1 mg via INTRAVENOUS
  Filled 2016-08-12: qty 1

## 2016-08-12 MED ORDER — METOPROLOL TARTRATE 25 MG PO TABS
25.0000 mg | ORAL_TABLET | Freq: Two times a day (BID) | ORAL | Status: DC
  Administered 2016-08-13 – 2016-08-17 (×9): 25 mg via ORAL
  Filled 2016-08-12 (×9): qty 1

## 2016-08-12 MED ORDER — DEXTROSE-NACL 5-0.9 % IV SOLN
INTRAVENOUS | Status: DC
  Administered 2016-08-12: 100 mL via INTRAVENOUS

## 2016-08-12 MED ORDER — SODIUM CHLORIDE 0.9 % IV SOLN
INTRAVENOUS | Status: DC
  Administered 2016-08-12 (×2): via INTRAVENOUS

## 2016-08-12 MED ORDER — DEXTROSE 50 % IV SOLN
25.0000 mL | Freq: Once | INTRAVENOUS | Status: AC
  Administered 2016-08-12: 25 mL via INTRAVENOUS

## 2016-08-12 MED ORDER — ISOSORBIDE MONONITRATE ER 30 MG PO TB24
30.0000 mg | ORAL_TABLET | Freq: Every day | ORAL | Status: DC
  Administered 2016-08-13 – 2016-08-17 (×5): 30 mg via ORAL
  Filled 2016-08-12 (×5): qty 1

## 2016-08-12 MED ORDER — HEPARIN SODIUM (PORCINE) 5000 UNIT/ML IJ SOLN
5000.0000 [IU] | Freq: Three times a day (TID) | INTRAMUSCULAR | Status: DC
  Administered 2016-08-12 – 2016-08-16 (×10): 5000 [IU] via SUBCUTANEOUS
  Filled 2016-08-12 (×10): qty 1

## 2016-08-12 NOTE — Progress Notes (Signed)
Hypoglycemic Event  CBG: 56 @ 0054  Treatment: D50 IV 25 mL  Symptoms: none  Follow-up CBG: Time: 0115 CBG Result: 111  Possible Reasons for Event: Unknown  Comments/MD notified: Dr Clide Cliff, Daralene Milch

## 2016-08-12 NOTE — Progress Notes (Signed)
@IPLOG @        PROGRESS NOTE                                                                                                                                                                                                             Patient Demographics:    Aaron Stephenson, is a 83 y.o. male, DOB - 15-Dec-1924, ACZ:[id number]  Admit date - 08/11/2016   Admitting Physician Lily Kocher, MD  Outpatient Primary MD for the patient is Vernie Shanks, MD  LOS - 1  Chief Complaint  Patient presents with  . Weakness  . Altered Mental Status       Brief Narrative   Aaron Stephenson is a 83 y.o. gentleman with a history of dementia (still lives at home with his wife), polycythemia vera, CKD 3, HTN, DM, and thrombocytopenia who presents to the ED via EMS for evaluation of increased weakness, confusion, and lethargy, Workup in the ER showed severe sepsis likely due to right foot cellulitis and gram-negative bacteria also pneumonia was also in consideration.   Subjective:    Aaron Stephenson today has, No headache, No chest pain, No abdominal pain - No Nausea, No new weakness tingling or numbness, No Cough - SOB.    Assessment  & Plan :     1.Sepsis with gram-negative bacteremia. Likely source right foot cellulitis, cannot rule out pneumonia yet. We will switch to meropenem IV for gram-negative bacteremia, continue vancomycin for another day till other sources were ruled out completely, continue IV fluid bolus and maintenance, continue supportive care, he remains critical in stepdown, family updated bedside. DO NOT RESUSCITATE also confirmed.  2. Essential hypertension. Blood pressure soft, hydrate, hold blood pressure medications today.  3. ARF on CKD 3. His creatinine close to 1.4, hydrate and monitor. Hold ACE inhibitor and blood pressure medications. Avoid nephrotoxins.  4. BPH. On alpha-blocker continue.  5. Mild underlying dementia. At risk for delirium, family updated, continue  supportive care, minimize narcotics and benzodiazepine use.  6. CAD. Continue Plavix, statin and once blood pressure is stable Imdur and beta blocker for secondary prevention.  7. DM type II. On oral hypoglycemic and had hypoglycemia in the ER due to sepsis. Received IV D50 along with IV glucagon, monitor CBGs, hold glucose lowering medications.  CBG (last 3)   Recent Labs  08/12/16 0526 08/12/16 0808 08/12/16 0953  GLUCAP 85 61* 81     Diet : Diet heart healthy/carb modified Room service appropriate? Yes; Fluid consistency: Thin    Family  Communication  :  daughters  Code Status :  DNR  Disposition Plan  :  TBD  Consults  :  None  Procedures  :    DVT Prophylaxis  : Heparin   Lab Results  Component Value Date   PLT 73 (L) 08/12/2016    Inpatient Medications  Scheduled Meds: . atorvastatin  10 mg Oral QHS  . clopidogrel  75 mg Oral Q breakfast  . docusate sodium  100 mg Oral Daily  . fesoterodine  8 mg Oral Daily  . finasteride  5 mg Oral QHS  . heparin subcutaneous  5,000 Units Subcutaneous Q8H  . isosorbide mononitrate  60 mg Oral Daily  . lisinopril  5 mg Oral QHS  . metoprolol tartrate  25 mg Oral BID  . mirtazapine  15 mg Oral QHS  . tamsulosin  0.4 mg Oral BID   Continuous Infusions: . sodium chloride    . aztreonam Stopped (08/12/16 0228)  . [START ON 08/13/2016] levofloxacin (LEVAQUIN) IV    . sodium chloride    . vancomycin     PRN Meds:.acetaminophen, ondansetron (ZOFRAN) IV  Antibiotics  :    Anti-infectives    Start     Dose/Rate Route Frequency Ordered Stop   08/13/16 1800  levofloxacin (LEVAQUIN) IVPB 750 mg     750 mg 100 mL/hr over 90 Minutes Intravenous Every 48 hours 08/11/16 1903     08/12/16 2000  vancomycin (VANCOCIN) IVPB 750 mg/150 ml premix     750 mg 150 mL/hr over 60 Minutes Intravenous Every 24 hours 08/11/16 1903     08/12/16 0200  aztreonam (AZACTAM) 1 g in dextrose 5 % 50 mL IVPB     1 g 100 mL/hr over 30 Minutes  Intravenous Every 8 hours 08/11/16 1903     08/11/16 1815  levofloxacin (LEVAQUIN) IVPB 750 mg     750 mg 100 mL/hr over 90 Minutes Intravenous  Once 08/11/16 1811 08/11/16 2106   08/11/16 1815  aztreonam (AZACTAM) 2 g in dextrose 5 % 50 mL IVPB     2 g 100 mL/hr over 30 Minutes Intravenous  Once 08/11/16 1811 08/11/16 1926   08/11/16 1815  vancomycin (VANCOCIN) IVPB 1000 mg/200 mL premix     1,000 mg 200 mL/hr over 60 Minutes Intravenous  Once 08/11/16 1811 08/11/16 2106         Objective:   Vitals:   08/12/16 0700 08/12/16 0730 08/12/16 0800 08/12/16 1000  BP: 119/79 (!) 113/56 133/78 (!) 84/32  Pulse:  75 (!) 32   Resp: (!) 27 (!) 26 (!) 27   Temp:   98.2 F (36.8 C)   TempSrc:   Oral   SpO2:  (!) 82% (!) 80%   Weight:      Height:        Wt Readings from Last 3 Encounters:  08/11/16 65.3 kg (144 lb)  06/27/16 72.6 kg (160 lb)  06/16/16 72.6 kg (160 lb)     Intake/Output Summary (Last 24 hours) at 08/12/16 1119 Last data filed at 08/12/16 0800  Gross per 24 hour  Intake          1513.75 ml  Output                0 ml  Net          1513.75 ml     Physical Exam  Awake , mildly confused, No new F.N deficits, Normal affect Ray.AT,PERRAL Supple Neck,No JVD, No  cervical lymphadenopathy appriciated.  Symmetrical Chest wall movement, Good air movement bilaterally, CTAB RRR,No Gallops,Rubs or new Murmurs, No Parasternal Heave +ve B.Sounds, Abd Soft, No tenderness, No organomegaly appriciated, No rebound - guarding or rigidity. No Cyanosis, Clubbing or edema, No new Rash or bruise, R foot swollen and red    Data Review:    CBC  Recent Labs Lab 08/11/16 1809 08/12/16 0352  WBC 7.0 6.6  HGB 16.6 14.3  HCT 48.2 42.2  PLT 81* 73*  MCV 87.2 87.0  MCH 30.0 29.5  MCHC 34.4 33.9  RDW 14.7 14.8  LYMPHSABS 0.4*  --   MONOABS 0.3  --   EOSABS 0.0  --   BASOSABS 0.0  --     Chemistries   Recent Labs Lab 08/11/16 1809 08/12/16 0352  NA 139 136  K 5.0  4.3  CL 107 108  CO2 22 20*  GLUCOSE 184* 70  BUN 38* 39*  CREATININE 1.68* 1.64*  CALCIUM 9.6 8.4*  AST 34  --   ALT 24  --   ALKPHOS 47  --   BILITOT 2.2*  --    ------------------------------------------------------------------------------------------------------------------ No results for input(s): CHOL, HDL, LDLCALC, TRIG, CHOLHDL, LDLDIRECT in the last 72 hours.  Lab Results  Component Value Date   HGBA1C 7.4 (H) 09/19/2014   ------------------------------------------------------------------------------------------------------------------ No results for input(s): TSH, T4TOTAL, T3FREE, THYROIDAB in the last 72 hours.  Invalid input(s): FREET3 ------------------------------------------------------------------------------------------------------------------ No results for input(s): VITAMINB12, FOLATE, FERRITIN, TIBC, IRON, RETICCTPCT in the last 72 hours.  Coagulation profile  Recent Labs Lab 08/11/16 1809  INR 1.55    No results for input(s): DDIMER in the last 72 hours.  Cardiac Enzymes No results for input(s): CKMB, TROPONINI, MYOGLOBIN in the last 168 hours.  Invalid input(s): CK ------------------------------------------------------------------------------------------------------------------ No results found for: BNP  Micro Results Recent Results (from the past 240 hour(s))  Culture, blood (Routine x 2)     Status: None (Preliminary result)   Collection Time: 08/11/16  6:05 PM  Result Value Ref Range Status   Specimen Description BLOOD RIGHT FOREARM  Final   Special Requests   Final    BOTTLES DRAWN AEROBIC AND ANAEROBIC Blood Culture adequate volume   Culture  Setup Time   Final    GRAM NEGATIVE RODS IN BOTH AEROBIC AND ANAEROBIC BOTTLES Organism ID to follow CRITICAL RESULT CALLED TO, READ BACK BY AND VERIFIED WITH: C SHADE 08/12/16 @ 25 M VESTAL Performed at [facility], [address], [place], [place] [address]    Culture GRAM NEGATIVE  RODS  Final   Report Status PENDING  Incomplete  Blood Culture ID Panel (Reflexed)     Status: Abnormal   Collection Time: 08/11/16  6:05 PM  Result Value Ref Range Status   Enterococcus species NOT DETECTED NOT DETECTED Final   Listeria monocytogenes NOT DETECTED NOT DETECTED Final   Staphylococcus species NOT DETECTED NOT DETECTED Final   Staphylococcus aureus NOT DETECTED NOT DETECTED Final   Streptococcus species NOT DETECTED NOT DETECTED Final   Streptococcus agalactiae NOT DETECTED NOT DETECTED Final   Streptococcus pneumoniae NOT DETECTED NOT DETECTED Final   Streptococcus pyogenes NOT DETECTED NOT DETECTED Final   Acinetobacter baumannii NOT DETECTED NOT DETECTED Final   Enterobacteriaceae species DETECTED (A) NOT DETECTED Final    Comment: Enterobacteriaceae represent a large family of gram negative bacteria, not a single organism. Refer to culture for further identification. CRITICAL RESULT CALLED TO, READ BACK BY AND VERIFIED WITH: C SHADE  08/12/16 @ 56 M VESTAL    Enterobacter cloacae complex NOT DETECTED NOT DETECTED Final   Escherichia coli NOT DETECTED NOT DETECTED Final   Klebsiella oxytoca NOT DETECTED NOT DETECTED Final   Klebsiella pneumoniae NOT DETECTED NOT DETECTED Final   Proteus species NOT DETECTED NOT DETECTED Final   Serratia marcescens NOT DETECTED NOT DETECTED Final   Carbapenem resistance NOT DETECTED NOT DETECTED Final   Haemophilus influenzae NOT DETECTED NOT DETECTED Final   Neisseria meningitidis NOT DETECTED NOT DETECTED Final   Pseudomonas aeruginosa NOT DETECTED NOT DETECTED Final   Candida albicans NOT DETECTED NOT DETECTED Final   Candida glabrata NOT DETECTED NOT DETECTED Final   Candida krusei NOT DETECTED NOT DETECTED Final   Candida parapsilosis NOT DETECTED NOT DETECTED Final   Candida tropicalis NOT DETECTED NOT DETECTED Final    Comment: Performed at [facility], [place] [address] [place], [place] [address]  Culture, blood  (Routine x 2)     Status: None (Preliminary result)   Collection Time: 08/11/16  6:13 PM  Result Value Ref Range Status   Specimen Description RIGHT ANTECUBITAL  Final   Special Requests   Final    BOTTLES DRAWN AEROBIC AND ANAEROBIC Blood Culture adequate volume   Culture  Setup Time   Final    GRAM NEGATIVE RODS IN BOTH AEROBIC AND ANAEROBIC BOTTLES Performed at [facility], [place] [address] [place], [place] [address]    Culture GRAM NEGATIVE RODS  Final   Report Status PENDING  Incomplete  MRSA PCR Screening     Status: None   Collection Time: 08/12/16  3:30 AM  Result Value Ref Range Status   MRSA by PCR NEGATIVE NEGATIVE Final    Comment:        The GeneXpert MRSA Assay (FDA approved for NASAL specimens only), is one component of a comprehensive MRSA colonization surveillance program. It is not intended to diagnose MRSA infection nor to guide or monitor treatment for MRSA infections.     Radiology Reports Dg Chest 2 View  Result Date: 08/11/2016 CLINICAL DATA:  Decreased mobility and worsening confusion for 2 days. EXAM: CHEST  2 VIEW COMPARISON:  Chest radiograph Jun 27, 2016 FINDINGS: The cardiac silhouette is moderately enlarged. Mediastinal silhouette is nonsuspicious, calcified aortic knob. Pulmonary vascular congestion. Retrocardiac consolidation with small LEFT pleural effusion. Strandy densities RIGHT lung base. Trachea deviated to the LEFT within the neck, new from prior radiograph which is likely positional. No pneumothorax. Severe bilateral glenohumeral osteoarthrosis. IMPRESSION: Retrocardiac consolidation and small LEFT pleural effusion. Followup PA and lateral chest X-ray is recommended in 3-4 weeks following trial of antibiotic therapy to ensure resolution and exclude underlying malignancy. Stable cardiomegaly and pulmonary vascular congestion. Electronically Signed   By: Elon Alas M.D.   On: 08/11/2016 18:43   Ct Head Wo Contrast  Result Date:  08/11/2016 CLINICAL DATA:  Unable to ambulate.  Confusion. EXAM: CT HEAD WITHOUT CONTRAST TECHNIQUE: Contiguous axial images were obtained from the base of the skull through the vertex without intravenous contrast. COMPARISON:  Jun 27, 2016 FINDINGS: Brain: Evaluation is somewhat limited due to patient motion. No subdural, epidural, or subarachnoid hemorrhage. Cerebellum, brainstem, and basal cisterns are normal. No mass effect or midline shift. Prominence of sulci and ventricles consistent with volume loss. No acute cortical ischemia or infarct is identified. Vascular: No hyperdense vessel or unexpected calcification. Skull: Normal. Negative for fracture or focal lesion. Sinuses/Orbits: No acute finding. Other: None. IMPRESSION: The  study is limited by motion. No acute abnormalities are identified. Electronically Signed   By: Dorise Bullion III M.D   On: 08/11/2016 20:56   Dg Chest Port 1 View  Result Date: 08/12/2016 CLINICAL DATA:  Sudden onset of sneezing this morning. Shortness of breath. EXAM: PORTABLE CHEST 1 VIEW COMPARISON:  08/11/2016 FINDINGS: Chronic cardiomegaly. Chronic aortic atherosclerosis. Left lower lobe volume loss/ infiltrate as shown yesterday. Remainder the chest is clear. No evidence of heart failure. IMPRESSION: Re- demonstration of left lower lobe atelectasis/ pneumonia. Electronically Signed   By: Nelson Chimes M.D.   On: 08/12/2016 10:13   Dg Foot Complete Right  Result Date: 08/11/2016 CLINICAL DATA:  Pain swelling and erythema at the right lower leg. Uncertain chronicity. EXAM: RIGHT FOOT COMPLETE - 3+ VIEW COMPARISON:  None. FINDINGS: Negative for fracture or other acute bony abnormality. No bony destruction or bone lesion. Marked soft tissue swelling at the dorsum of the foot. No soft tissue gas or soft tissue foreign body. Mild arthritic changes are present, particularly at the first MTP joint and at most of the PIP joints. IMPRESSION: Mild arthritic changes of the forefoot.  No acute bony abnormality or bony destruction. Electronically Signed   By: Andreas Newport M.D.   On: 08/11/2016 23:27    Time Spent in minutes  30   Lala Lund M.D on 08/12/2016 at 11:19 AM  Between 7am to 7pm - Pager - [phone] ( page via [email or web address], text pages only, please mention full 10 digit call back number). After 7pm go to [email or web address] - password [facility]

## 2016-08-12 NOTE — Evaluation (Signed)
Clinical/Bedside Swallow Evaluation Patient Details  Name: Aaron Stephenson MRN: [id number] Date of Birth: 1924/07/14  Today's Date: 08/12/2016 Time: SLP Start Time (ACUTE ONLY): 7530 SLP Stop Time (ACUTE ONLY): 1250 SLP Time Calculation (min) (ACUTE ONLY): 15 min  Past Medical History:  Past Medical History:  Diagnosis Date  . Acute cystitis without hematuria   . Acute kidney injury superimposed on chronic kidney disease (HCC)    CKD 3  . BPH (benign prostatic hyperplasia)   . CKD (chronic kidney disease)   . Cognitive impairment   . Constipation    Unspecified  . Dementia   . DM (diabetes mellitus), type 2 ([place])   . Enterococcus UTI   . Esophagitis   . GERD (gastroesophageal reflux disease)   . Hiatal hernia   . HTN (hypertension)   . Hyperlipidemia   . Migraine   . Myeloproliferative disease ([place])   . OA (osteoarthritis)   . Physical deconditioning   . Polycythemia vera(238.4) 02/20/2011  . Protein calorie malnutrition ([place])   . Spinal stenosis, lumbar   . Thrombocytopenia ([place])   . Toxic encephalopathy   . Vertigo   . Volume depletion    Past Surgical History:  Past Surgical History:  Procedure Laterality Date  . LOWER LEG SOFT TISSUE TUMOR EXCISION    . TOTAL HIP ARTHROPLASTY  1993   right   HPI:  Pt is an 83 y.o. male with PMH of dementia (still lives at home with his wife), polycythemia vera, CKD 3, HTN, DM, and thrombocytopenia who presents to the ED via EMS for evaluation of increased weakness, confusion, and lethargy, Workup in the ER showed severe sepsis likely due to right foot cellulitis and gram-negative bacteria also pneumonia was also in consideration. CXR showed LLL atelectasis/ PNA. Pt followed briefly by speech therapy in June 2017 and was noted to be impacted by confusion; recommendation at that time was dysphagia 3/ thin liquids. Bedside swallow eval ordered.    Assessment / Plan / Recommendation Clinical Impression  Pt cleared throat x1 following  trial of regular solid (lunch tray); no other overt s/s of aspiration noted. However, pt did appear slightly more SOB during PO intake. Given these findings and current respiratory status along with baseline dementia, pt at an increased risk of aspiration. Recommend downgrading diet to dysphagia 3/ thin liquids, supervision during meals to assist with feeding as needed and cue pt to take small bites/ sips. Will f/u x1 to ensure diet tolerance/ consider advancement. SLP Visit Diagnosis: Dysphagia, unspecified (R13.10)    Aspiration Risk  Moderate aspiration risk    Diet Recommendation Dysphagia 3 (Mech soft);Thin liquid   Liquid Administration via: Cup;Straw Medication Administration: Whole meds with liquid Supervision: Patient able to self feed;Intermittent supervision to cue for compensatory strategies Compensations: Slow rate;Small sips/bites Postural Changes: Seated upright at 90 degrees    Other  Recommendations Oral Care Recommendations: Oral care BID   Follow up Recommendations  (TBD)      Frequency and Duration min 1 x/week  1 week       Prognosis Prognosis for Safe Diet Advancement: Fair Barriers to Reach Goals: Cognitive deficits      Swallow Study   General HPI: Pt is an 83 y.o. male with PMH of dementia (still lives at home with his wife), polycythemia vera, CKD 3, HTN, DM, and thrombocytopenia who presents to the ED via EMS for evaluation of increased weakness, confusion, and lethargy, Workup in the ER showed severe sepsis likely due to  right foot cellulitis and gram-negative bacteria also pneumonia was also in consideration. CXR showed LLL atelectasis/ PNA. Pt followed briefly by speech therapy in June 2017 and was noted to be impacted by confusion; recommendation at that time was dysphagia 3/ thin liquids. Bedside swallow eval ordered.  Type of Study: Bedside Swallow Evaluation Previous Swallow Assessment:  (See HPI) Diet Prior to this Study: Regular;Thin  liquids Temperature Spikes Noted: Yes Respiratory Status: Nasal cannula History of Recent Intubation: No Behavior/Cognition: Alert;Cooperative;Confused Oral Cavity Assessment: Within Functional Limits Self-Feeding Abilities: Able to feed self Patient Positioning: Upright in bed Baseline Vocal Quality: Normal    Oral/Motor/Sensory Function Overall Oral Motor/Sensory Function: Within functional limits   Ice Chips Ice chips: Not tested   Thin Liquid Thin Liquid: Within functional limits    Nectar Thick Nectar Thick Liquid: Not tested   Honey Thick Honey Thick Liquid: Not tested   Puree Puree: Not tested   Solid   GO   Solid: Impaired Presentation: Self Fed Pharyngeal Phase Impairments: Throat Clearing - Immediate        Amy Dionicia Abler, MA, CCC-SLP 08/12/2016,12:56 PM

## 2016-08-12 NOTE — Progress Notes (Signed)
CRITICAL VALUE ALERT  Critical Value:  CBG - 61  Date & Time Notied:  08/12/2016 0820  Provider Notified: order for glucagon/ low blood sugars documented in chart  Orders Received/Actions taken: administered glucagon and gave 8 ounces of orange juice.  Breakfast tray ordered.  CBG to be retaken to monitor sugars

## 2016-08-12 NOTE — Progress Notes (Signed)
Patient had 3 episodes of hypoglycemia over the night, Dr Lily Kocher made aware of each event. Patient showed no symptom when he had the episodes. Patient now placed on D5 NS at rate on 184ml/hr.

## 2016-08-12 NOTE — Progress Notes (Signed)
Pharmacy Antibiotic Note  Aaron Stephenson is a 83 y.o. male presented to the ED via EMS on 08/11/2016 with c/o AMS, weakness and red/swollen right leg. He was initially started on broad abx with Vancomycin, Levaquin (Dr. Rogene Houston is aware of pt's allergy to cipro), and Aztreonam for suspected sepsis.  Likely source cellulitis vs HCAP.  MD changing gram negative coverage to Meropenem today.  No hx seizures noted.   Spoke with Dr Candiss Norse re: +BCID (blood cx growing GNR), but he does not want to eliminate Vancomycin at this time due to cellulitis and elevated PCT on admission.  Broaden/Change GN coverage to Meropenem to include possible resistant organisms.  08/12/2016:  Afebrile since starting antibiotics  WBC wnl  Acute on chronic kidney disease, estimated CrCl ~20-64ml/min  LA has trended to WNL, PCT elevated @ 32.96 on admission prior to starting antibiotics  Blood cx growing GN enterbacteriaceae (not on BCID panel)  MRSA PCR & S.Pneumo negative  Plan: - Continue Vancomycin 750 mg IV q24h - Change GN coverage to Meropenem 500mg  IV q12h - Daily Scr -Consider re-check PCT or de-escalate therapy based on blood cx ______________________  Height: 5\' 2"  (157.5 cm) Weight: 144 lb (65.3 kg) IBW/kg (Calculated) : 54.6  Temp (24hrs), Avg:99.9 F (37.7 C), Min:98.1 F (36.7 C), Max:103.4 F (39.7 C)   Recent Labs Lab 08/11/16 1809 08/11/16 2110 08/11/16 2356 08/12/16 0352 08/12/16 0745  WBC 7.0  --   --  6.6  --   CREATININE 1.68*  --   --  1.64*  --   LATICACIDVEN 3.36* 3.16* 2.7* 2.4* 1.9    Estimated Creatinine Clearance: 23.6 mL/min (A) (by C-G formula based on SCr of 1.64 mg/dL (H)).    Allergies  Allergen Reactions  . Penicillins Hives, Swelling and Anaphylaxis    Has patient had a PCN reaction causing immediate rash, facial/tongue/throat swelling, SOB or lightheadedness with hypotension: unknown Has patient had a PCN reaction causing severe rash involving mucus  membranes or skin necrosis: unknown Has patient had a PCN reaction that required hospitalization: unknown Has patient had a PCN reaction occurring within the last 10 years: unknown If all of the above answers are "NO", then may proceed with Cephalosporin use. Pts family is unable to answer questions.  . Aspirin Other (See Comments)  . Ciprofloxacin Other (See Comments)    Joint swelling  . Macrodantin [Nitrofurantoin Macrocrystal] Other (See Comments)    Chest pains  . Other     "lubriderm pain pill?"  . Oxycodone Other (See Comments)    Pt took OxyContin and tramadol together and almost "died"  . Sulfamethoxazole Other (See Comments)    Unknown - doesn't remember reaction  . Tramadol Other (See Comments)    Blank stare, no expression, becoming addictive  . Unisom [Doxylamine Succinate (Sleep)] Other (See Comments)    Hangover the next day  . Xanax [Alprazolam] Other (See Comments)    loopy   PHARMACY - PHYSICIAN COMMUNICATION CRITICAL VALUE ALERT - BLOOD CULTURE IDENTIFICATION (BCID)  Results for orders placed or performed during the hospital encounter of 08/11/16  Blood Culture ID Panel (Reflexed) (Collected: 08/11/2016  6:05 PM)  Result Value Ref Range   Enterococcus species NOT DETECTED NOT DETECTED   Listeria monocytogenes NOT DETECTED NOT DETECTED   Staphylococcus species NOT DETECTED NOT DETECTED   Staphylococcus aureus NOT DETECTED NOT DETECTED   Streptococcus species NOT DETECTED NOT DETECTED   Streptococcus agalactiae NOT DETECTED NOT DETECTED   Streptococcus pneumoniae NOT DETECTED  NOT DETECTED   Streptococcus pyogenes NOT DETECTED NOT DETECTED   Acinetobacter baumannii NOT DETECTED NOT DETECTED   Enterobacteriaceae species DETECTED (A) NOT DETECTED   Enterobacter cloacae complex NOT DETECTED NOT DETECTED   Escherichia coli NOT DETECTED NOT DETECTED   Klebsiella oxytoca NOT DETECTED NOT DETECTED   Klebsiella pneumoniae NOT DETECTED NOT DETECTED   Proteus species  NOT DETECTED NOT DETECTED   Serratia marcescens NOT DETECTED NOT DETECTED   Carbapenem resistance NOT DETECTED NOT DETECTED   Haemophilus influenzae NOT DETECTED NOT DETECTED   Neisseria meningitidis NOT DETECTED NOT DETECTED   Pseudomonas aeruginosa NOT DETECTED NOT DETECTED   Candida albicans NOT DETECTED NOT DETECTED   Candida glabrata NOT DETECTED NOT DETECTED   Candida krusei NOT DETECTED NOT DETECTED   Candida parapsilosis NOT DETECTED NOT DETECTED   Candida tropicalis NOT DETECTED NOT DETECTED   Antimicrobials this admission:  6/29 vanc>> 6/29 LVQ>>6/30 6/29 aztreo>>6/30 6/30 Meropenem>>  Dose adjustments this admission:   Microbiology results:  6/29 BCx x2: GNR (BCID-enterbacteriaceae) 6/30 MRSA PCR: Negative 6/30 SPneumo: negative  Thank you for allowing pharmacy to be a part of this patient's care.  Biagio Borg 08/12/2016 11:09 AM

## 2016-08-13 ENCOUNTER — Inpatient Hospital Stay (HOSPITAL_COMMUNITY): Payer: Medicare Other

## 2016-08-13 DIAGNOSIS — M79671 Pain in right foot: Secondary | ICD-10-CM

## 2016-08-13 DIAGNOSIS — M79672 Pain in left foot: Secondary | ICD-10-CM

## 2016-08-13 LAB — CBC
HEMATOCRIT: 41.7 % (ref 39.0–52.0)
HEMOGLOBIN: 14.3 g/dL (ref 13.0–17.0)
MCH: 29.5 pg (ref 26.0–34.0)
MCHC: 34.3 g/dL (ref 30.0–36.0)
MCV: 86 fL (ref 78.0–100.0)
Platelets: 60 10*3/uL — ABNORMAL LOW (ref 150–400)
RBC: 4.85 MIL/uL (ref 4.22–5.81)
RDW: 15 % (ref 11.5–15.5)
WBC: 8.5 10*3/uL (ref 4.0–10.5)

## 2016-08-13 LAB — BASIC METABOLIC PANEL
ANION GAP: 6 (ref 5–15)
BUN: 41 mg/dL — ABNORMAL HIGH (ref 6–20)
CHLORIDE: 109 mmol/L (ref 101–111)
CO2: 20 mmol/L — ABNORMAL LOW (ref 22–32)
Calcium: 8.1 mg/dL — ABNORMAL LOW (ref 8.9–10.3)
Creatinine, Ser: 1.46 mg/dL — ABNORMAL HIGH (ref 0.61–1.24)
GFR, EST AFRICAN AMERICAN: 47 mL/min — AB (ref >60)
GFR, EST NON AFRICAN AMERICAN: 41 mL/min — AB (ref >60)
GLUCOSE: 78 mg/dL (ref 65–99)
Potassium: 4.3 mmol/L (ref 3.5–5.1)
Sodium: 135 mmol/L (ref 135–145)

## 2016-08-13 LAB — GLUCOSE, CAPILLARY
GLUCOSE-CAPILLARY: 101 mg/dL — AB (ref 65–99)
GLUCOSE-CAPILLARY: 109 mg/dL — AB (ref 65–99)
GLUCOSE-CAPILLARY: 126 mg/dL — AB (ref 65–99)
GLUCOSE-CAPILLARY: 77 mg/dL (ref 65–99)
Glucose-Capillary: 68 mg/dL (ref 65–99)
Glucose-Capillary: 71 mg/dL (ref 65–99)
Glucose-Capillary: 106 mg/dL — ABNORMAL HIGH (ref 65–99)
Glucose-Capillary: 72 mg/dL (ref 65–99)
Glucose-Capillary: 109 mg/dL — ABNORMAL HIGH (ref 65–99)

## 2016-08-13 LAB — PROCALCITONIN: Procalcitonin: 35.73 ng/mL

## 2016-08-13 MED ORDER — LEVOFLOXACIN IN D5W 750 MG/150ML IV SOLN
750.0000 mg | Freq: Once | INTRAVENOUS | Status: AC
  Administered 2016-08-13: 750 mg via INTRAVENOUS
  Filled 2016-08-13: qty 150

## 2016-08-13 MED ORDER — FUROSEMIDE 10 MG/ML IJ SOLN
40.0000 mg | Freq: Once | INTRAMUSCULAR | Status: AC
  Administered 2016-08-13: 40 mg via INTRAVENOUS
  Filled 2016-08-13: qty 4

## 2016-08-13 MED ORDER — GLUCAGON HCL RDNA (DIAGNOSTIC) 1 MG IJ SOLR
1.0000 mg | Freq: Once | INTRAMUSCULAR | Status: AC | PRN
  Administered 2016-08-13: 1 mg via INTRAVENOUS
  Filled 2016-08-13: qty 1

## 2016-08-13 MED ORDER — SODIUM CHLORIDE 0.9 % IV SOLN
INTRAVENOUS | Status: AC
  Administered 2016-08-13 – 2016-08-14 (×2): via INTRAVENOUS

## 2016-08-13 NOTE — Progress Notes (Signed)
@IPLOG @        PROGRESS NOTE                                                                                                                                                                                                             Patient Demographics:    Aaron Stephenson, is a 83 y.o. male, DOB - 1924-11-22, XNT:[id number]  Admit date - 08/11/2016   Admitting Physician Lily Kocher, MD  Outpatient Primary MD for the patient is Vernie Shanks, MD  LOS - 2  Chief Complaint  Patient presents with  . Weakness  . Altered Mental Status       Brief Narrative   Aaron Stephenson is a 38 y.o. gentleman with a history of dementia (still lives at home with his wife), polycythemia vera, CKD 3, HTN, DM, and thrombocytopenia who presents to the ED via EMS for evaluation of increased weakness, confusion, and lethargy, Workup in the ER showed severe sepsis likely due to right foot cellulitis and gram-negative bacteria also pneumonia was also in consideration.   Subjective:   Patient in bed, appears comfortable, denies any headache, no fever, no chest pain or pressure, no shortness of breath , no abdominal pain. No focal weakness.   Assessment  & Plan :     1.Sepsis with gram-negative bacteremia. Likely source right foot cellulitis, cannot rule out pneumonia . At this time continue meropenem only based on Gram stain results and stop vancomycin, continue IV fluids for hydration, clinically sepsis physiology seems to be improving, I still do not have a good source for his gram-negative bacteremia, he does have history of inguinal hernia with a benign abdominal exam, he is abdominal pain-free however he is not the best historian. Will check CT abdomen and pelvis with oral contrast as well to rule out a GI source.  2. Essential hypertension. Pressure medications on hold due to #1 above.  3. ARF on CKD 3. His creatinine close to 1.4, back to baseline after hydration continue to hold ACE.  4. BPH.  On alpha-blocker continue.  5. Mild underlying dementia. At risk for delirium, family updated, continue supportive care, minimize narcotics and benzodiazepine use.  6. CAD. Continue Plavix, statin and once blood pressure is stable will resume Imdur and beta blocker for secondary prevention.  7. Right foot and ankle. Bruising and redness persists, rash and if he has some soft tissue or ligament injury as well, x-rays were unremarkable we'll check CT scan of his right foot to rule out any occult  fracture. He does recall on 08/13/2016 that he might have fallen at home once. Could have twisted his ankle.  8. DM type II. On oral hypoglycemic and had hypoglycemia in the ER due to sepsis. Received IV D50 along with IV glucagon, monitor CBGs, hold glucose lowering medications.  CBG (last 3)   Recent Labs  08/13/16 0813 08/13/16 0834 08/13/16 0905  GLUCAP 71 72 106*     Diet : DIET DYS 3 Room service appropriate? Yes; Fluid consistency: Thin    Family Communication  :  daughters  Code Status :  DNR  Disposition Plan  :  TBD  Consults  :  None  Procedures  :    DVT Prophylaxis  : Heparin   Lab Results  Component Value Date   PLT 60 (L) 08/13/2016    Inpatient Medications  Scheduled Meds: . atorvastatin  10 mg Oral QHS  . clopidogrel  75 mg Oral Q breakfast  . docusate sodium  100 mg Oral Daily  . fesoterodine  8 mg Oral Daily  . finasteride  5 mg Oral QHS  . heparin subcutaneous  5,000 Units Subcutaneous Q8H  . isosorbide mononitrate  30 mg Oral Daily  . metoprolol tartrate  25 mg Oral BID  . mirtazapine  15 mg Oral QHS  . tamsulosin  0.4 mg Oral BID   Continuous Infusions: . sodium chloride 75 mL/hr at 08/12/16 2000  . meropenem (MERREM) IV Stopped (08/12/16 2351)  . vancomycin Stopped (08/12/16 2114)   PRN Meds:.acetaminophen, glucagon (human recombinant), ondansetron (ZOFRAN) IV  Antibiotics  :    Anti-infectives    Start     Dose/Rate Route Frequency Ordered  Stop   08/13/16 1800  levofloxacin (LEVAQUIN) IVPB 750 mg  Status:  Discontinued     750 mg 100 mL/hr over 90 Minutes Intravenous Every 48 hours 08/11/16 1903 08/12/16 1133   08/12/16 2000  vancomycin (VANCOCIN) IVPB 750 mg/150 ml premix     750 mg 150 mL/hr over 60 Minutes Intravenous Every 24 hours 08/11/16 1903     08/12/16 1200  meropenem (MERREM) 500 mg in sodium chloride 0.9 % 50 mL IVPB     500 mg 100 mL/hr over 30 Minutes Intravenous Every 12 hours 08/12/16 1133     08/12/16 0200  aztreonam (AZACTAM) 1 g in dextrose 5 % 50 mL IVPB  Status:  Discontinued     1 g 100 mL/hr over 30 Minutes Intravenous Every 8 hours 08/11/16 1903 08/12/16 1133   08/11/16 1815  levofloxacin (LEVAQUIN) IVPB 750 mg     750 mg 100 mL/hr over 90 Minutes Intravenous  Once 08/11/16 1811 08/11/16 2106   08/11/16 1815  aztreonam (AZACTAM) 2 g in dextrose 5 % 50 mL IVPB     2 g 100 mL/hr over 30 Minutes Intravenous  Once 08/11/16 1811 08/11/16 1926   08/11/16 1815  vancomycin (VANCOCIN) IVPB 1000 mg/200 mL premix     1,000 mg 200 mL/hr over 60 Minutes Intravenous  Once 08/11/16 1811 08/11/16 2106         Objective:   Vitals:   08/13/16 0047 08/13/16 0333 08/13/16 0400 08/13/16 0800  BP:   110/74 123/76  Pulse:   (!) 107 (!) 110  Resp:   (!) 26 (!) 27  Temp: 99 F (37.2 C) 98.4 F (36.9 C)  98.3 F (36.8 C)  TempSrc: Oral Oral  Oral  SpO2:   100% (!) 89%  Weight:      Height:  Wt Readings from Last 3 Encounters:  08/11/16 65.3 kg (144 lb)  06/27/16 72.6 kg (160 lb)  06/16/16 72.6 kg (160 lb)     Intake/Output Summary (Last 24 hours) at 08/13/16 1020 Last data filed at 08/13/16 0600  Gross per 24 hour  Intake          1543.75 ml  Output              700 ml  Net           843.75 ml     Physical Exam  Awake , Pleasantly confused, No new F.N deficits, Normal affect Amaya.AT,PERRAL Supple Neck,No JVD, No cervical lymphadenopathy appriciated.  Symmetrical Chest wall movement,  Good air movement bilaterally, few crackles RRR,No Gallops,Rubs or new Murmurs, No Parasternal Heave +ve B.Sounds, Abd Soft, No tenderness, No organomegaly appriciated, No rebound - guarding or rigidity. No Cyanosis Right foot and ankle remain red, some bruising also, both legs are swollen but right more than left    Data Review:    CBC  Recent Labs Lab 08/11/16 1809 08/12/16 0352 08/13/16 0340  WBC 7.0 6.6 8.5  HGB 16.6 14.3 14.3  HCT 48.2 42.2 41.7  PLT 81* 73* 60*  MCV 87.2 87.0 86.0  MCH 30.0 29.5 29.5  MCHC 34.4 33.9 34.3  RDW 14.7 14.8 15.0  LYMPHSABS 0.4*  --   --   MONOABS 0.3  --   --   EOSABS 0.0  --   --   BASOSABS 0.0  --   --     Chemistries   Recent Labs Lab 08/11/16 1809 08/12/16 0352 08/13/16 0340  NA 139 136 135  K 5.0 4.3 4.3  CL 107 108 109  CO2 22 20* 20*  GLUCOSE 184* 70 78  BUN 38* 39* 41*  CREATININE 1.68* 1.64* 1.46*  CALCIUM 9.6 8.4* 8.1*  AST 34  --   --   ALT 24  --   --   ALKPHOS 47  --   --   BILITOT 2.2*  --   --    ------------------------------------------------------------------------------------------------------------------ No results for input(s): CHOL, HDL, LDLCALC, TRIG, CHOLHDL, LDLDIRECT in the last 72 hours.  Lab Results  Component Value Date   HGBA1C 7.4 (H) 09/19/2014   ------------------------------------------------------------------------------------------------------------------ No results for input(s): TSH, T4TOTAL, T3FREE, THYROIDAB in the last 72 hours.  Invalid input(s): FREET3 ------------------------------------------------------------------------------------------------------------------ No results for input(s): VITAMINB12, FOLATE, FERRITIN, TIBC, IRON, RETICCTPCT in the last 72 hours.  Coagulation profile  Recent Labs Lab 08/11/16 1809  INR 1.55    No results for input(s): DDIMER in the last 72 hours.  Cardiac Enzymes No results for input(s): CKMB, TROPONINI, MYOGLOBIN in the last 168  hours.  Invalid input(s): CK ------------------------------------------------------------------------------------------------------------------ No results found for: BNP  Micro Results Recent Results (from the past 240 hour(s))  Culture, blood (Routine x 2)     Status: None (Preliminary result)   Collection Time: 08/11/16  6:05 PM  Result Value Ref Range Status   Specimen Description BLOOD RIGHT FOREARM  Final   Special Requests   Final    BOTTLES DRAWN AEROBIC AND ANAEROBIC Blood Culture adequate volume   Culture  Setup Time   Final    GRAM NEGATIVE RODS IN BOTH AEROBIC AND ANAEROBIC BOTTLES CRITICAL RESULT CALLED TO, READ BACK BY AND VERIFIED WITH: C SHADE 08/12/16 @ 22 M VESTAL Performed at [facility], [address] [place], [place] [address]    Culture GRAM NEGATIVE RODS  Final   Report Status PENDING  Incomplete  Blood Culture ID Panel (Reflexed)     Status: Abnormal   Collection Time: 08/11/16  6:05 PM  Result Value Ref Range Status   Enterococcus species NOT DETECTED NOT DETECTED Final   Listeria monocytogenes NOT DETECTED NOT DETECTED Final   Staphylococcus species NOT DETECTED NOT DETECTED Final   Staphylococcus aureus NOT DETECTED NOT DETECTED Final   Streptococcus species NOT DETECTED NOT DETECTED Final   Streptococcus agalactiae NOT DETECTED NOT DETECTED Final   Streptococcus pneumoniae NOT DETECTED NOT DETECTED Final   Streptococcus pyogenes NOT DETECTED NOT DETECTED Final   Acinetobacter baumannii NOT DETECTED NOT DETECTED Final   Enterobacteriaceae species DETECTED (A) NOT DETECTED Final    Comment: Enterobacteriaceae represent a large family of gram negative bacteria, not a single organism. Refer to culture for further identification. CRITICAL RESULT CALLED TO, READ BACK BY AND VERIFIED WITH: C SHADE 08/12/16 @ 66 M VESTAL    Enterobacter cloacae complex NOT DETECTED NOT DETECTED Final   Escherichia coli NOT DETECTED NOT DETECTED Final   Klebsiella  oxytoca NOT DETECTED NOT DETECTED Final   Klebsiella pneumoniae NOT DETECTED NOT DETECTED Final   Proteus species NOT DETECTED NOT DETECTED Final   Serratia marcescens NOT DETECTED NOT DETECTED Final   Carbapenem resistance NOT DETECTED NOT DETECTED Final   Haemophilus influenzae NOT DETECTED NOT DETECTED Final   Neisseria meningitidis NOT DETECTED NOT DETECTED Final   Pseudomonas aeruginosa NOT DETECTED NOT DETECTED Final   Candida albicans NOT DETECTED NOT DETECTED Final   Candida glabrata NOT DETECTED NOT DETECTED Final   Candida krusei NOT DETECTED NOT DETECTED Final   Candida parapsilosis NOT DETECTED NOT DETECTED Final   Candida tropicalis NOT DETECTED NOT DETECTED Final    Comment: Performed at [facility], [place] [address] [place], [place] [address]  Culture, blood (Routine x 2)     Status: None (Preliminary result)   Collection Time: 08/11/16  6:13 PM  Result Value Ref Range Status   Specimen Description RIGHT ANTECUBITAL  Final   Special Requests   Final    BOTTLES DRAWN AEROBIC AND ANAEROBIC Blood Culture adequate volume   Culture  Setup Time   Final    GRAM NEGATIVE RODS IN BOTH AEROBIC AND ANAEROBIC BOTTLES CRITICAL VALUE NOTED.  VALUE IS CONSISTENT WITH PREVIOUSLY REPORTED AND CALLED VALUE. Performed at [facility], [place] [address] [place],  [address]    Culture GRAM NEGATIVE RODS  Final   Report Status PENDING  Incomplete  MRSA PCR Screening     Status: None   Collection Time: 08/12/16  3:30 AM  Result Value Ref Range Status   MRSA by PCR NEGATIVE NEGATIVE Final    Comment:        The GeneXpert MRSA Assay (FDA approved for NASAL specimens only), is one component of a comprehensive MRSA colonization surveillance program. It is not intended to diagnose MRSA infection nor to guide or monitor treatment for MRSA infections.     Radiology Reports Dg Chest 2 View  Result Date: 08/11/2016 CLINICAL DATA:  Decreased mobility and worsening  confusion for 2 days. EXAM: CHEST  2 VIEW COMPARISON:  Chest radiograph Jun 27, 2016 FINDINGS: The cardiac silhouette is moderately enlarged. Mediastinal silhouette is nonsuspicious, calcified aortic knob. Pulmonary vascular congestion. Retrocardiac consolidation with small LEFT pleural effusion. Strandy densities RIGHT lung base. Trachea deviated to the LEFT within the neck, new from prior radiograph which is likely positional.  No pneumothorax. Severe bilateral glenohumeral osteoarthrosis. IMPRESSION: Retrocardiac consolidation and small LEFT pleural effusion. Followup PA and lateral chest X-ray is recommended in 3-4 weeks following trial of antibiotic therapy to ensure resolution and exclude underlying malignancy. Stable cardiomegaly and pulmonary vascular congestion. Electronically Signed   By: Elon Alas M.D.   On: 08/11/2016 18:43   Ct Head Wo Contrast  Result Date: 08/11/2016 CLINICAL DATA:  Unable to ambulate.  Confusion. EXAM: CT HEAD WITHOUT CONTRAST TECHNIQUE: Contiguous axial images were obtained from the base of the skull through the vertex without intravenous contrast. COMPARISON:  Jun 27, 2016 FINDINGS: Brain: Evaluation is somewhat limited due to patient motion. No subdural, epidural, or subarachnoid hemorrhage. Cerebellum, brainstem, and basal cisterns are normal. No mass effect or midline shift. Prominence of sulci and ventricles consistent with volume loss. No acute cortical ischemia or infarct is identified. Vascular: No hyperdense vessel or unexpected calcification. Skull: Normal. Negative for fracture or focal lesion. Sinuses/Orbits: No acute finding. Other: None. IMPRESSION: The study is limited by motion. No acute abnormalities are identified. Electronically Signed   By: Dorise Bullion III M.D   On: 08/11/2016 20:56   Dg Chest Port 1 View  Result Date: 08/12/2016 CLINICAL DATA:  Sudden onset of sneezing this morning. Shortness of breath. EXAM: PORTABLE CHEST 1 VIEW COMPARISON:   08/11/2016 FINDINGS: Chronic cardiomegaly. Chronic aortic atherosclerosis. Left lower lobe volume loss/ infiltrate as shown yesterday. Remainder the chest is clear. No evidence of heart failure. IMPRESSION: Re- demonstration of left lower lobe atelectasis/ pneumonia. Electronically Signed   By: Nelson Chimes M.D.   On: 08/12/2016 10:13   Dg Foot Complete Right  Result Date: 08/11/2016 CLINICAL DATA:  Pain swelling and erythema at the right lower leg. Uncertain chronicity. EXAM: RIGHT FOOT COMPLETE - 3+ VIEW COMPARISON:  None. FINDINGS: Negative for fracture or other acute bony abnormality. No bony destruction or bone lesion. Marked soft tissue swelling at the dorsum of the foot. No soft tissue gas or soft tissue foreign body. Mild arthritic changes are present, particularly at the first MTP joint and at most of the PIP joints. IMPRESSION: Mild arthritic changes of the forefoot. No acute bony abnormality or bony destruction. Electronically Signed   By: Andreas Newport M.D.   On: 08/11/2016 23:27    Time Spent in minutes  30   Lala Lund M.D on 08/13/2016 at 10:20 AM  Between 7am to 7pm - Pager - [phone] ( page via [email or web address], text pages only, please mention full 10 digit call back number). After 7pm go to [email or web address] - password [email or web address]

## 2016-08-13 NOTE — Progress Notes (Signed)
Received call from [place] in lab of positive blood culture for Salmonella. Ronnie Derby, MD notified.

## 2016-08-13 NOTE — Progress Notes (Signed)
CRITICAL VALUE ALERT  Critical Value:  Positive Salmonella Blood culture  Date & Time Notied:  08/13/16 1730  Provider Notified: P. Candiss Norse, MD notified   Orders Received/Actions taken:provider notified

## 2016-08-14 ENCOUNTER — Encounter (HOSPITAL_COMMUNITY): Payer: Self-pay | Admitting: Cardiology

## 2016-08-14 ENCOUNTER — Inpatient Hospital Stay (HOSPITAL_COMMUNITY): Payer: Medicare Other

## 2016-08-14 DIAGNOSIS — R5081 Fever presenting with conditions classified elsewhere: Secondary | ICD-10-CM

## 2016-08-14 LAB — BASIC METABOLIC PANEL
ANION GAP: 7 (ref 5–15)
BUN: 43 mg/dL — ABNORMAL HIGH (ref 6–20)
CALCIUM: 8 mg/dL — AB (ref 8.9–10.3)
CO2: 18 mmol/L — AB (ref 22–32)
CREATININE: 1.44 mg/dL — AB (ref 0.61–1.24)
Chloride: 110 mmol/L (ref 101–111)
GFR, EST AFRICAN AMERICAN: 48 mL/min — AB (ref >60)
GFR, EST NON AFRICAN AMERICAN: 42 mL/min — AB (ref >60)
Glucose, Bld: 109 mg/dL — ABNORMAL HIGH (ref 65–99)
Potassium: 4.4 mmol/L (ref 3.5–5.1)
SODIUM: 135 mmol/L (ref 135–145)

## 2016-08-14 LAB — CBC
HCT: 41.5 % (ref 39.0–52.0)
Hemoglobin: 14.4 g/dL (ref 13.0–17.0)
MCH: 29.8 pg (ref 26.0–34.0)
MCHC: 34.7 g/dL (ref 30.0–36.0)
MCV: 85.9 fL (ref 78.0–100.0)
PLATELETS: 63 10*3/uL — AB (ref 150–400)
RBC: 4.83 MIL/uL (ref 4.22–5.81)
RDW: 15.1 % (ref 11.5–15.5)
WBC: 10.2 10*3/uL (ref 4.0–10.5)

## 2016-08-14 LAB — HEPATIC FUNCTION PANEL
ALK PHOS: 45 U/L (ref 38–126)
ALT: 26 U/L (ref 17–63)
AST: 28 U/L (ref 15–41)
Albumin: 2.2 g/dL — ABNORMAL LOW (ref 3.5–5.0)
BILIRUBIN DIRECT: 0.3 mg/dL (ref 0.1–0.5)
BILIRUBIN INDIRECT: 0.6 mg/dL (ref 0.3–0.9)
Total Bilirubin: 0.9 mg/dL (ref 0.3–1.2)
Total Protein: 5 g/dL — ABNORMAL LOW (ref 6.5–8.1)

## 2016-08-14 LAB — GLUCOSE, CAPILLARY
GLUCOSE-CAPILLARY: 148 mg/dL — AB (ref 65–99)
GLUCOSE-CAPILLARY: 127 mg/dL — AB (ref 65–99)
Glucose-Capillary: 137 mg/dL — ABNORMAL HIGH (ref 65–99)
Glucose-Capillary: 124 mg/dL — ABNORMAL HIGH (ref 65–99)

## 2016-08-14 LAB — CULTURE, BLOOD (ROUTINE X 2): SPECIAL REQUESTS: ADEQUATE

## 2016-08-14 LAB — LEGIONELLA PNEUMOPHILA SEROGP 1 UR AG: L. pneumophila Serogp 1 Ur Ag: NEGATIVE

## 2016-08-14 LAB — TSH: TSH: 2.248 u[IU]/mL (ref 0.350–4.500)

## 2016-08-14 MED ORDER — METRONIDAZOLE IN NACL 5-0.79 MG/ML-% IV SOLN
500.0000 mg | Freq: Three times a day (TID) | INTRAVENOUS | Status: DC
  Administered 2016-08-14 – 2016-08-15 (×4): 500 mg via INTRAVENOUS
  Filled 2016-08-14 (×6): qty 100

## 2016-08-14 MED ORDER — LORAZEPAM 2 MG/ML IJ SOLN
1.0000 mg | Freq: Once | INTRAMUSCULAR | Status: AC | PRN
  Administered 2016-08-14: 1 mg via INTRAVENOUS
  Filled 2016-08-14: qty 1

## 2016-08-14 MED ORDER — GADOBENATE DIMEGLUMINE 529 MG/ML IV SOLN: 15.0000 mL | Freq: Once | INTRAVENOUS | Status: DC | PRN

## 2016-08-14 MED ORDER — SODIUM CHLORIDE 0.9 % IV BOLUS (SEPSIS)
500.0000 mL | Freq: Once | INTRAVENOUS | Status: AC
  Administered 2016-08-14: 500 mL via INTRAVENOUS

## 2016-08-14 MED ORDER — LEVOFLOXACIN IN D5W 750 MG/150ML IV SOLN
750.0000 mg | INTRAVENOUS | Status: DC
  Administered 2016-08-15: 750 mg via INTRAVENOUS
  Filled 2016-08-14: qty 150

## 2016-08-14 NOTE — Progress Notes (Signed)
Afib noted on the monitor by RN. Patient's heart rate elevating into the 140s with activity. Hal Hope, MD notified. Orders for 500 cc bolus and 12 lead EKG ordered. EKG read AFIB. EKG in patient's chart.

## 2016-08-14 NOTE — Progress Notes (Signed)
  Speech Language Pathology Treatment: Dysphagia  Patient Details Name: Aaron Stephenson MRN: [id number] DOB: 03-Dec-1924 Today's Date: 08/14/2016 Time: 9179-1505 SLP Time Calculation (min) (ACUTE ONLY): 22 min  Assessment / Plan / Recommendation Clinical Impression  Per SLP discussion with RN, pt is coughing some after intake - Pt received ativan for MRI *per RN, but he was awake and able to follow directions to participate in treatment session.  Pt agreeable to consume granola bar, icecream and gingerale.  Initially with intake, pt appeared to tolerate well, however after 2 bites of granola bar, pt presented with overt coughing and increased RR - concerning for possible pharyngeal residuals and/or laryngeal penetration/aspiration.  Provided po icecream facilitated/cleared coughing - suspect due to reflexive swallowing.   As liquid consumption is clinically better tolerated, pt CXR continues with ATX/?infiltrate and pt admits to some difficulty with swallowing, recommend modify to full liquid diet and pursue MBS to allow instrumental assessment.  Left written message for RN with recommendations.    HPI HPI: Pt is an 83 y.o. male with PMH of dementia (still lives at home with his wife), polycythemia vera, CKD 3, HTN, DM, and thrombocytopenia who presents to the ED via EMS for evaluation of increased weakness, confusion, and lethargy, Workup in the ER showed severe sepsis likely due to right foot cellulitis and gram-negative bacteria also pneumonia was also in consideration. CXR showed LLL atelectasis/ PNA. Pt followed briefly by speech therapy in June 2017 and was noted to be impacted by confusion; recommendation at that time was dysphagia 3/ thin liquids. Bedside swallow eval ordered.       SLP Plan  MBS (08/15/16)       Recommendations  Diet recommendations: Thin liquid;Nectar-thick liquid (full liquid diet) Liquids provided via: Straw;Cup Medication Administration: Other (Comment) (with puree if  problematic) Compensations: Slow rate;Small sips/bites Postural Changes and/or Swallow Maneuvers: Seated upright 90 degrees;Upright 30-60 min after meal                Oral Care Recommendations: Oral care BID Follow up Recommendations:  (TBD) SLP Visit Diagnosis: Dysphagia, unspecified (R13.10) Plan: MBS (08/15/16)       Schaller, Bernice [facility] SLP [phone]

## 2016-08-14 NOTE — Progress Notes (Signed)
Pharmacy Antibiotic Note  Aaron Stephenson is a 83 y.o. male presented to the ED via EMS on 08/11/2016 with c/o AMS, weakness and red/swollen right leg. He was initially started on broad abx with Vancomycin, Levaquin (Dr. Rogene Houston is aware of pt's allergy to cipro), and Aztreonam for suspected sepsis.  Likely source GI vs cellulitis vs HCAP.  MD changing gram negative coverage to Meropenem on 6/30 with an additional Levaquin dose also ordered.  Pharmacy is consulted on 08/14/16 to change antibiotics to Levaquin and Flagyl.   Allergies: PCN: hives, swelling, anaphylaxis.  No record of cephs given Cipro: joint swelling (has tolerated Levaquin x2 doses) Sulfamethoxazole: unknown  08/14/2016:  Afebrile since starting antibiotics  WBC wnl  Acute on chronic kidney disease, estimated CrCl ~27 ml/min  LA has trended to WNL, PCT elevated @ 32.96 on admission prior to starting antibiotics  MRI Ankle:  No definite abscess or pyomyositis. There is diffuse cellulitis and myofasciitis.  No septic arthritis or osteomyelitis.  Plan:  Flagyl 500mg  IV q8h  Levaquin 750mg  IV q48h  Monitor renal function, cultures  Monitor closely for adverse reactions from Levaquin with known cipro allergy.  MD aware.  Also discussed with patient.  ______________________  Height: 5\' 2"  (157.5 cm) Weight: 144 lb (65.3 kg) IBW/kg (Calculated) : 54.6  Temp (24hrs), Avg:98.6 F (37 C), Min:97.9 F (36.6 C), Max:99.3 F (37.4 C)   Recent Labs Lab 08/11/16 1809 08/11/16 2110 08/11/16 2356 08/12/16 0352 08/12/16 0745 08/13/16 0340 08/14/16 0327  WBC 7.0  --   --  6.6  --  8.5 10.2  CREATININE 1.68*  --   --  1.64*  --  1.46* 1.44*  LATICACIDVEN 3.36* 3.16* 2.7* 2.4* 1.9  --   --     Estimated Creatinine Clearance: 26.9 mL/min (A) (by C-G formula based on SCr of 1.44 mg/dL (H)).    Allergies  Allergen Reactions  . Penicillins Hives, Swelling and Anaphylaxis    Has patient had a PCN reaction causing  immediate rash, facial/tongue/throat swelling, SOB or lightheadedness with hypotension: unknown Has patient had a PCN reaction causing severe rash involving mucus membranes or skin necrosis: unknown Has patient had a PCN reaction that required hospitalization: unknown Has patient had a PCN reaction occurring within the last 10 years: unknown If all of the above answers are "NO", then may proceed with Cephalosporin use. Pts family is unable to answer questions.  . Aspirin Other (See Comments)  . Ciprofloxacin Other (See Comments)    Joint swelling  . Macrodantin [Nitrofurantoin Macrocrystal] Other (See Comments)    Chest pains  . Other     "lubriderm pain pill?"  . Oxycodone Other (See Comments)    Pt took OxyContin and tramadol together and almost "died"  . Sulfamethoxazole Other (See Comments)    Unknown - doesn't remember reaction  . Tramadol Other (See Comments)    Blank stare, no expression, becoming addictive  . Unisom [Doxylamine Succinate (Sleep)] Other (See Comments)    Hangover the next day  . Xanax [Alprazolam] Other (See Comments)    loopy   PHARMACY - PHYSICIAN COMMUNICATION CRITICAL VALUE ALERT - BLOOD CULTURE IDENTIFICATION (BCID)  Results for orders placed or performed during the hospital encounter of 08/11/16  Blood Culture ID Panel (Reflexed) (Collected: 08/11/2016  6:05 PM)  Result Value Ref Range   Enterococcus species NOT DETECTED NOT DETECTED   Listeria monocytogenes NOT DETECTED NOT DETECTED   Staphylococcus species NOT DETECTED NOT DETECTED   Staphylococcus aureus  NOT DETECTED NOT DETECTED   Streptococcus species NOT DETECTED NOT DETECTED   Streptococcus agalactiae NOT DETECTED NOT DETECTED   Streptococcus pneumoniae NOT DETECTED NOT DETECTED   Streptococcus pyogenes NOT DETECTED NOT DETECTED   Acinetobacter baumannii NOT DETECTED NOT DETECTED   Enterobacteriaceae species DETECTED (A) NOT DETECTED   Enterobacter cloacae complex NOT DETECTED NOT DETECTED    Escherichia coli NOT DETECTED NOT DETECTED   Klebsiella oxytoca NOT DETECTED NOT DETECTED   Klebsiella pneumoniae NOT DETECTED NOT DETECTED   Proteus species NOT DETECTED NOT DETECTED   Serratia marcescens NOT DETECTED NOT DETECTED   Carbapenem resistance NOT DETECTED NOT DETECTED   Haemophilus influenzae NOT DETECTED NOT DETECTED   Neisseria meningitidis NOT DETECTED NOT DETECTED   Pseudomonas aeruginosa NOT DETECTED NOT DETECTED   Candida albicans NOT DETECTED NOT DETECTED   Candida glabrata NOT DETECTED NOT DETECTED   Candida krusei NOT DETECTED NOT DETECTED   Candida parapsilosis NOT DETECTED NOT DETECTED   Candida tropicalis NOT DETECTED NOT DETECTED   Antimicrobials this admission:  6/29 vanc >> 7/1 6/29 Levaquin >> 6/29 aztreo >> 6/30 6/30 Meropenem>> 7/2 7/2 Flagyl >>  Dose adjustments this admission:   Microbiology results:  6/29 BCx x2: Salmonella sp (Sensitive: ampicillin, levofloxacin, Trimeth/sulfa)          BCID-enterbacteriaceae not on panel 6/30 MRSA PCR: Negative 6/30 SPneumo: negative  Thank you for allowing pharmacy to be a part of this patient's care.  Gretta Arab PharmD, BCPS Pager [phone] 08/14/2016 10:28 AM

## 2016-08-14 NOTE — Progress Notes (Addendum)
@IPLOG @        PROGRESS NOTE                                                                                                                                                                                                             Patient Demographics:    Aaron Stephenson, is a 83 y.o. male, DOB - 14-Apr-1924, QGB:[id number]  Admit date - 08/11/2016   Admitting Physician Lily Kocher, MD  Outpatient Primary MD for the patient is Vernie Shanks, MD  LOS - 3  Chief Complaint  Patient presents with  . Weakness  . Altered Mental Status       Brief Narrative   Aaron Stephenson is a 38 y.o. gentleman with a history of dementia (still lives at home with his wife), polycythemia vera, CKD 3, HTN, DM, and thrombocytopenia who presents to the ED via EMS for evaluation of increased weakness, confusion, and lethargy, Workup in the ER showed severe sepsis likely due to right foot cellulitis With right foot and ankle swelling and redness.  His blood cultures subsequently became positive for gram-negative rods and finally for salmonella, this raised the suspicion of a GI source, patient surprisingly has no GI symptoms, CT scan was done which was nonspecific, right upper quadrant ultrasound has been ordered, he may require HIDA scan as well to rule out GI source completely. He also went into A. fib night of 08-13-2016.   Subjective:   Patient in bed, appears comfortable, denies any headache, no fever, no chest pain or pressure, no shortness of breath , no abdominal pain, No nausea. No focal weakness.   Assessment  & Plan :     1.Sepsis with gram-negative bacteremia. Initially came in with right foot and ankle swelling and redness, this could have been musculoskeletal with soft tissue injury and bruising, with him now having salmonella bacteremia it raises suspicion that he became septic from a GI source. He surprisingly has no GI symptoms. CT scan abdomen and pelvis is nonspecific, checking right  upper quadrant ultrasound, if needed will check HIDA scan as well to rule out gallbladder as the source of his salmonella bacteremia. Switched antibiotic to Unasyn on 08-14-2016 and discussed case with ID physician Dr. Johnnye Sima who agrees with plan and management. Currently sepsis physiology has resolved.  Question of pneumonia upon admission, think this is mostly atelectasis, he has no pulmonary symptoms. Continue monitoring on Unasyn. Incentive spirometry added .  2. Essential hypertension. Blood pressure soft, hold blood pressure medications.  3. ARF on  CKD 3. After IV fluids and hydration back to his baseline creatinine of 1.4.  4. BPH. On alpha-blocker continue.  5. Mild underlying dementia. At risk for delirium, family updated, continue supportive care, minimize narcotics and benzodiazepine use.  6. CAD. Continue Plavix, statin and once blood pressure is stable will resume Imdur and beta blocker for secondary prevention.  7. Right foot and ankle. Bruising and redness persists, rash and if he has some soft tissue or ligament injury as well, x-rays and CT scan unremarkable check MRI, I suspect he has soft tissue injury and bruising. Could have twisted his ankle.  8. New onset paroxysmal A. fib on night of 08-13-2016. Goal will be rate controlled, currently in sinus, cardiology consulted, check echocardiogram and TSH, due to his soft blood pressure if it becomes a issue his options will be amiodarone or digoxin. [place] vasc 2 score of at least 4. Defer long-term anticoagulation to primary cardiologist Dr. Wynonia Lawman.  9. DM type II. On oral hypoglycemic and had hypoglycemia in the ER due to sepsis. Received IV D50 along with IV glucagon, monitor CBGs, hold glucose lowering medications.  CBG (last 3)   Recent Labs  08/13/16 1607 08/13/16 2110 08/14/16 0808  GLUCAP 109* 126* 148*     Diet : DIET DYS 3 Room service appropriate? Yes; Fluid consistency: Thin    Family Communication  :   daughters  Code Status :  DNR  Disposition Plan  :  TBD  Consults  :  Cardiology, discussed case with ID Dr. Johnnye Sima on 08/14/2016  Procedures  :    DVT Prophylaxis  : Heparin   Lab Results  Component Value Date   PLT 63 (L) 08/14/2016    Inpatient Medications  Scheduled Meds: . atorvastatin  10 mg Oral QHS  . clopidogrel  75 mg Oral Q breakfast  . docusate sodium  100 mg Oral Daily  . fesoterodine  8 mg Oral Daily  . finasteride  5 mg Oral QHS  . heparin subcutaneous  5,000 Units Subcutaneous Q8H  . isosorbide mononitrate  30 mg Oral Daily  . metoprolol tartrate  25 mg Oral BID  . mirtazapine  15 mg Oral QHS  . tamsulosin  0.4 mg Oral BID   Continuous Infusions: . sodium chloride 50 mL/hr at 08/14/16 0146  . meropenem (MERREM) IV Stopped (08/14/16 0206)   PRN Meds:.acetaminophen, LORazepam, ondansetron (ZOFRAN) IV  Antibiotics  :    Anti-infectives    Start     Dose/Rate Route Frequency Ordered Stop   08/13/16 1800  levofloxacin (LEVAQUIN) IVPB 750 mg  Status:  Discontinued     750 mg 100 mL/hr over 90 Minutes Intravenous Every 48 hours 08/11/16 1903 08/12/16 1133   08/13/16 1800  levofloxacin (LEVAQUIN) IVPB 750 mg     750 mg 100 mL/hr over 90 Minutes Intravenous  Once 08/13/16 1324 08/13/16 1901   08/12/16 2000  vancomycin (VANCOCIN) IVPB 750 mg/150 ml premix  Status:  Discontinued     750 mg 150 mL/hr over 60 Minutes Intravenous Every 24 hours 08/11/16 1903 08/13/16 1038   08/12/16 1200  meropenem (MERREM) 500 mg in sodium chloride 0.9 % 50 mL IVPB     500 mg 100 mL/hr over 30 Minutes Intravenous Every 12 hours 08/12/16 1133     08/12/16 0200  aztreonam (AZACTAM) 1 g in dextrose 5 % 50 mL IVPB  Status:  Discontinued     1 g 100 mL/hr over 30 Minutes Intravenous Every 8 hours  08/11/16 1903 08/12/16 1133   08/11/16 1815  levofloxacin (LEVAQUIN) IVPB 750 mg     750 mg 100 mL/hr over 90 Minutes Intravenous  Once 08/11/16 1811 08/11/16 2106   08/11/16 1815   aztreonam (AZACTAM) 2 g in dextrose 5 % 50 mL IVPB     2 g 100 mL/hr over 30 Minutes Intravenous  Once 08/11/16 1811 08/11/16 1926   08/11/16 1815  vancomycin (VANCOCIN) IVPB 1000 mg/200 mL premix     1,000 mg 200 mL/hr over 60 Minutes Intravenous  Once 08/11/16 1811 08/11/16 2106         Objective:   Vitals:   08/14/16 0440 08/14/16 0500 08/14/16 0600 08/14/16 0700  BP: 104/69 107/62 107/75 (!) 100/53  Pulse: (!) 116 94 (!) 113 88  Resp: (!) 27 (!) 25 (!) 24 20  Temp:      TempSrc:      SpO2: 99% 100% 97% (!) 62%  Weight:      Height:        Wt Readings from Last 3 Encounters:  08/11/16 65.3 kg (144 lb)  06/27/16 72.6 kg (160 lb)  06/16/16 72.6 kg (160 lb)     Intake/Output Summary (Last 24 hours) at 08/14/16 9798 Last data filed at 08/14/16 0700  Gross per 24 hour  Intake             1850 ml  Output             2550 ml  Net             -700 ml     Physical Exam  Awake ,Remains mildly confused, No new F.N deficits, Normal affect [place].AT,PERRAL Supple Neck,No JVD, No cervical lymphadenopathy appriciated.  Symmetrical Chest wall movement, Good air movement bilaterally, few crackles RRR,No Gallops,Rubs or new Murmurs, No Parasternal Heave +ve B.Sounds, Abd Soft, No tenderness, No organomegaly appriciated, No rebound - guarding or rigidity. No Cyanosis, Clubbing or edema, No new Rash or bruise Right foot and ankle remain red, some bruising also, both legs are swollen but right more than left    Data Review:    CBC  Recent Labs Lab 08/11/16 1809 08/12/16 0352 08/13/16 0340 08/14/16 0327  WBC 7.0 6.6 8.5 10.2  HGB 16.6 14.3 14.3 14.4  HCT 48.2 42.2 41.7 41.5  PLT 81* 73* 60* 63*  MCV 87.2 87.0 86.0 85.9  MCH 30.0 29.5 29.5 29.8  MCHC 34.4 33.9 34.3 34.7  RDW 14.7 14.8 15.0 15.1  LYMPHSABS 0.4*  --   --   --   MONOABS 0.3  --   --   --   EOSABS 0.0  --   --   --   BASOSABS 0.0  --   --   --     Chemistries   Recent Labs Lab 08/11/16 1809  08/12/16 0352 08/13/16 0340 08/14/16 0327  NA 139 136 135 135  K 5.0 4.3 4.3 4.4  CL 107 108 109 110  CO2 22 20* 20* 18*  GLUCOSE 184* 70 78 109*  BUN 38* 39* 41* 43*  CREATININE 1.68* 1.64* 1.46* 1.44*  CALCIUM 9.6 8.4* 8.1* 8.0*  AST 34  --   --   --   ALT 24  --   --   --   ALKPHOS 47  --   --   --   BILITOT 2.2*  --   --   --    ------------------------------------------------------------------------------------------------------------------ No results for input(s): CHOL, HDL, LDLCALC,  TRIG, CHOLHDL, LDLDIRECT in the last 72 hours.  Lab Results  Component Value Date   HGBA1C 7.4 (H) 09/19/2014   ------------------------------------------------------------------------------------------------------------------ No results for input(s): TSH, T4TOTAL, T3FREE, THYROIDAB in the last 72 hours.  Invalid input(s): FREET3 ------------------------------------------------------------------------------------------------------------------ No results for input(s): VITAMINB12, FOLATE, FERRITIN, TIBC, IRON, RETICCTPCT in the last 72 hours.  Coagulation profile  Recent Labs Lab 08/11/16 1809  INR 1.55    No results for input(s): DDIMER in the last 72 hours.  Cardiac Enzymes No results for input(s): CKMB, TROPONINI, MYOGLOBIN in the last 168 hours.  Invalid input(s): CK ------------------------------------------------------------------------------------------------------------------ No results found for: BNP  Micro Results Recent Results (from the past 240 hour(s))  Culture, blood (Routine x 2)     Status: Abnormal (Preliminary result)   Collection Time: 08/11/16  6:05 PM  Result Value Ref Range Status   Specimen Description BLOOD RIGHT FOREARM  Final   Special Requests   Final    BOTTLES DRAWN AEROBIC AND ANAEROBIC Blood Culture adequate volume   Culture  Setup Time   Final    GRAM NEGATIVE RODS IN BOTH AEROBIC AND ANAEROBIC BOTTLES CRITICAL RESULT CALLED TO, READ BACK BY  AND VERIFIED WITH: C SHADE 08/12/16 @ 77 M VESTAL    Culture (A)  Final    SALMONELLA SPECIES RESULT CALLED TO, READ BACK BY AND VERIFIED WITH: L RUDD,RN AT 1731 08/13/16 BY L [place] NOTIFIED REFERRED TO [facility] IN [place] IDENTIFICATION/CONFIRMATION Performed at [facility], [place] [address], [place], [place] [address]    Report Status PENDING  Incomplete   Organism ID, Bacteria SALMONELLA SPECIES  Final      Susceptibility   Salmonella species - MIC*    AMPICILLIN <=2 SENSITIVE Sensitive     LEVOFLOXACIN <=0.12 SENSITIVE Sensitive     TRIMETH/SULFA <=20 SENSITIVE Sensitive     * SALMONELLA SPECIES  Blood Culture ID Panel (Reflexed)     Status: Abnormal   Collection Time: 08/11/16  6:05 PM  Result Value Ref Range Status   Enterococcus species NOT DETECTED NOT DETECTED Final   Listeria monocytogenes NOT DETECTED NOT DETECTED Final   Staphylococcus species NOT DETECTED NOT DETECTED Final   Staphylococcus aureus NOT DETECTED NOT DETECTED Final   Streptococcus species NOT DETECTED NOT DETECTED Final   Streptococcus agalactiae NOT DETECTED NOT DETECTED Final   Streptococcus pneumoniae NOT DETECTED NOT DETECTED Final   Streptococcus pyogenes NOT DETECTED NOT DETECTED Final   Acinetobacter baumannii NOT DETECTED NOT DETECTED Final   Enterobacteriaceae species DETECTED (A) NOT DETECTED Final    Comment: Enterobacteriaceae represent a large family of gram negative bacteria, not a single organism. Refer to culture for further identification. CRITICAL RESULT CALLED TO, READ BACK BY AND VERIFIED WITH: C SHADE 08/12/16 @ 64 M VESTAL    Enterobacter cloacae complex NOT DETECTED NOT DETECTED Final   Escherichia coli NOT DETECTED NOT DETECTED Final   Klebsiella oxytoca NOT DETECTED NOT DETECTED Final   Klebsiella pneumoniae NOT DETECTED NOT DETECTED Final   Proteus species NOT DETECTED NOT DETECTED Final   Serratia  marcescens NOT DETECTED NOT DETECTED Final   Carbapenem resistance NOT DETECTED NOT DETECTED Final   Haemophilus influenzae NOT DETECTED NOT DETECTED Final   Neisseria meningitidis NOT DETECTED NOT DETECTED Final   Pseudomonas aeruginosa NOT DETECTED NOT DETECTED Final   Candida albicans NOT DETECTED NOT DETECTED Final   Candida glabrata NOT DETECTED NOT DETECTED Final   Candida krusei NOT  DETECTED NOT DETECTED Final   Candida parapsilosis NOT DETECTED NOT DETECTED Final   Candida tropicalis NOT DETECTED NOT DETECTED Final    Comment: Performed at [facility], [place] [address], [place], [place] [address]  Culture, blood (Routine x 2)     Status: Abnormal   Collection Time: 08/11/16  6:13 PM  Result Value Ref Range Status   Specimen Description RIGHT ANTECUBITAL  Final   Special Requests   Final    BOTTLES DRAWN AEROBIC AND ANAEROBIC Blood Culture adequate volume   Culture  Setup Time   Final    GRAM NEGATIVE RODS IN BOTH AEROBIC AND ANAEROBIC BOTTLES CRITICAL VALUE NOTED.  VALUE IS CONSISTENT WITH PREVIOUSLY REPORTED AND CALLED VALUE.    Culture (A)  Final    SALMONELLA SPECIES RESULT CALLED TO, READ BACK BY AND VERIFIED WITH: L RUDD,RN AT 1731 08/13/16 BY L [place] NOTIFIED REFERRED TO [facility] IN [place], [place] FOR IDENTIFICATION/CONFIRMATION SUSCEPTIBILITIES PERFORMED ON PREVIOUS CULTURE WITHIN THE LAST 5 DAYS. Performed at [facility], [place] [address], [place], [place] [address]    Report Status 08/14/2016 FINAL  Final  MRSA PCR Screening     Status: None   Collection Time: 08/12/16  3:30 AM  Result Value Ref Range Status   MRSA by PCR NEGATIVE NEGATIVE Final    Comment:        The GeneXpert MRSA Assay (FDA approved for NASAL specimens only), is one component of a comprehensive MRSA colonization surveillance program. It is not intended to diagnose MRSA infection nor to guide or monitor treatment for MRSA  infections.     Radiology Reports Ct Abdomen Pelvis Wo Contrast  Result Date: 08/13/2016 CLINICAL DATA:  Sepsis, abdominal pain EXAM: CT ABDOMEN AND PELVIS WITHOUT CONTRAST TECHNIQUE: Multidetector CT imaging of the abdomen and pelvis was performed following the standard protocol without IV contrast. COMPARISON:  12/28/2009 FINDINGS: Lower chest: Small bilateral pleural effusions with bibasilar opacities likely compressive atelectasis. Mild cardiomegaly and small pericardial effusion. Hepatobiliary: Numerous hypodensities throughout the liver are stable since prior study and most compatible with cysts. Mild distention of the gallbladder. No biliary ductal dilatation. Pancreas: No focal abnormality or ductal dilatation. Spleen: No focal abnormality.  Normal size. Adrenals/Urinary Tract: Large bilateral renal cysts. No hydronephrosis. Adrenal glands and urinary bladder are unremarkable. Stomach/Bowel: Appendix is normal. Stomach, large and small bowel grossly unremarkable. Vascular/Lymphatic: Diffuse aortic and iliac calcifications. No aneurysm. Reproductive: Mild prostate enlargement. Other: No free fluid or free air. There is a right inguinal hernia containing distal small bowel loops without obstruction. Fluid also present within the hernia sac. Musculoskeletal: Prior right hip replacement. Lucency in the right supra-acetabular region is stable since prior study. No acute bony abnormality. IMPRESSION: Small bilateral pleural effusions and pericardial effusion. Bibasilar atelectasis. Cardiomegaly. Hepatic and bilateral renal cysts. Mild distention of the gallbladder. If there is clinical concern for cholecystitis, right upper quadrant ultrasound may be beneficial. Right inguinal hernia containing small bowel loops. No evidence of bowel obstruction. Fluid also present within the right inguinal hernia. Electronically Signed   By: Rolm Baptise M.D.   On: 08/13/2016 10:46   Dg Chest 2 View  Result Date:  08/11/2016 CLINICAL DATA:  Decreased mobility and worsening confusion for 2 days. EXAM: CHEST  2 VIEW COMPARISON:  Chest radiograph Jun 27, 2016 FINDINGS: The cardiac silhouette is moderately enlarged. Mediastinal silhouette is nonsuspicious, calcified aortic knob. Pulmonary vascular congestion. Retrocardiac consolidation with small LEFT  pleural effusion. Strandy densities RIGHT lung base. Trachea deviated to the LEFT within the neck, new from prior radiograph which is likely positional. No pneumothorax. Severe bilateral glenohumeral osteoarthrosis. IMPRESSION: Retrocardiac consolidation and small LEFT pleural effusion. Followup PA and lateral chest X-ray is recommended in 3-4 weeks following trial of antibiotic therapy to ensure resolution and exclude underlying malignancy. Stable cardiomegaly and pulmonary vascular congestion. Electronically Signed   By: Elon Alas M.D.   On: 08/11/2016 18:43   Ct Head Wo Contrast  Result Date: 08/11/2016 CLINICAL DATA:  Unable to ambulate.  Confusion. EXAM: CT HEAD WITHOUT CONTRAST TECHNIQUE: Contiguous axial images were obtained from the base of the skull through the vertex without intravenous contrast. COMPARISON:  Jun 27, 2016 FINDINGS: Brain: Evaluation is somewhat limited due to patient motion. No subdural, epidural, or subarachnoid hemorrhage. Cerebellum, brainstem, and basal cisterns are normal. No mass effect or midline shift. Prominence of sulci and ventricles consistent with volume loss. No acute cortical ischemia or infarct is identified. Vascular: No hyperdense vessel or unexpected calcification. Skull: Normal. Negative for fracture or focal lesion. Sinuses/Orbits: No acute finding. Other: None. IMPRESSION: The study is limited by motion. No acute abnormalities are identified. Electronically Signed   By: Dorise Bullion III M.D   On: 08/11/2016 20:56   Ct Ankle Right Wo Contrast  Result Date: 08/13/2016 CLINICAL DATA:  G negative sepsis and severe right  foot pain and swelling. EXAM: CT OF THE RIGHT ANKLE WITHOUT CONTRAST TECHNIQUE: Multidetector CT imaging of the right ankle was performed according to the standard protocol. Multiplanar CT image reconstructions were also generated. COMPARISON:  Radiographs 08/11/2016 FINDINGS: Diffuse and marked subcutaneous soft tissue swelling/edema/ fluid involving the right lower extremity, ankle and foot. No obvious discrete drainable soft tissue abscess is identified. No definite findings for myofasciitis or pyomyositis. No CT evidence of septic arthritis or osteomyelitis. No fractures identified. IMPRESSION: Severe diffuse subcutaneous soft tissue swelling/ edema/fluid without discrete drainable soft tissue abscess, pyomyositis, septic arthritis or osteomyelitis. If symptoms persist or worsen MRI may be helpful for further evaluation and is much more sensitive for the above-mentioned entities. Electronically Signed   By: Marijo Sanes M.D.   On: 08/13/2016 10:57   Dg Chest Port 1 View  Result Date: 08/14/2016 CLINICAL DATA:  Fever. EXAM: PORTABLE CHEST 1 VIEW COMPARISON:  08/12/2016 . FINDINGS: Mediastinum and hilar structures are normal. Cardiomegaly with normal pulmonary vascularity. Persistent left lower lobe atelectasis and infiltrate. Small bilateral pleural effusions. No pneumothorax. Degenerative changes thoracic spine. IMPRESSION: 1. Persistent left lower lobe atelectasis and infiltrate. Small bilateral pleural effusions. 2. Stable cardiomegaly. Electronically Signed   By: Marcello Moores  Register   On: 08/14/2016 06:17   Dg Chest Port 1 View  Result Date: 08/12/2016 CLINICAL DATA:  Sudden onset of sneezing this morning. Shortness of breath. EXAM: PORTABLE CHEST 1 VIEW COMPARISON:  08/11/2016 FINDINGS: Chronic cardiomegaly. Chronic aortic atherosclerosis. Left lower lobe volume loss/ infiltrate as shown yesterday. Remainder the chest is clear. No evidence of heart failure. IMPRESSION: Re- demonstration of left lower  lobe atelectasis/ pneumonia. Electronically Signed   By: Nelson Chimes M.D.   On: 08/12/2016 10:13   Dg Foot Complete Right  Result Date: 08/11/2016 CLINICAL DATA:  Pain swelling and erythema at the right lower leg. Uncertain chronicity. EXAM: RIGHT FOOT COMPLETE - 3+ VIEW COMPARISON:  None. FINDINGS: Negative for fracture or other acute bony abnormality. No bony destruction or bone lesion. Marked soft tissue swelling at the dorsum of the foot. No soft  tissue gas or soft tissue foreign body. Mild arthritic changes are present, particularly at the first MTP joint and at most of the PIP joints. IMPRESSION: Mild arthritic changes of the forefoot. No acute bony abnormality or bony destruction. Electronically Signed   By: Andreas Newport M.D.   On: 08/11/2016 23:27   US Abdomen Limited Ruq  Result Date: 08/14/2016 CLINICAL DATA:  83 year old hypertensive diabetic male with fever. Initial encounter. EXAM: ULTRASOUND ABDOMEN LIMITED RIGHT UPPER QUADRANT COMPARISON:  08/13/2016 CT. FINDINGS: Gallbladder: Gallbladder sludge without discrete gallstone. Gallbladder wall thickening measuring up to 5 mm. Minimal amount of pericholecystic fluid. Per ultrasound technologist, patient was not tender over this region during scanning. Common bile duct: Diameter: 3.8 mm. Liver: Multiple liver cysts measuring up to 3 cm. 1.7 cm slightly complex cyst along the dome of the liver. No intrahepatic biliary duct dilation. Portal vein is patent. Right-sided pleural effusion. IMPRESSION: Gallbladder wall thickening with minimal amount of pericholecystic fluid. Gallbladder sludge without stone identified. Patient was not tender over this region during scanning per ultrasound technologist. It is possible findings are related to increased right heart pressure or liver dysfunction however, cholecystitis cannot be excluded in proper clinical setting. Multiple liver cysts measuring up to 3 cm. 1.7 cm cyst along the dome liver appears slightly  complex. Right-sided pleural effusion. Electronically Signed   By: Genia Del M.D.   On: 08/14/2016 07:31    Time Spent in minutes  30   Lala Lund M.D on 08/14/2016 at 9:21 AM  Between 7am to 7pm - Pager - [phone] ( page via [email or web address], text pages only, please mention full 10 digit call back number). After 7pm go to [email or web address] - password [facility]

## 2016-08-14 NOTE — Consult Note (Signed)
Cardiology Consult Note  Admit date: 08/11/2016 Name: Aaron Stephenson 83 y.o.  male DOB:  1924/04/27 MRN:  [id number]  Today's date:  08/14/2016  Referring Physician:    [facility]  Primary Physician:    Dr. Ulanda Edison  Reason for Consultation:    Atrial fibrillation  IMPRESSIONS: 1.  Persistent atrial fibrillation likely precipitated by recent sepsis 2.  Salmonella bacteremia and sepsis 3.  Coronary artery disease treated medically 4.  Aortic atherosclerosis 5.  Hypertensive heart disease  6.  Stage III chronic kidney disease 7.  Dementia  RECOMMENDATION: Repeat echocardiogram.  At the present time he is rate controlled.  Would continue to treat for sepsis and underlying causes.  His CHA2DS2VASC score is 4 and he would benefit from anticoagulation following resolution of his sepsis and salmonella bacteremia.  I would defer this until it is time for him to be discharged.  He does have a mild amount of dementia.  He is really not symptomatic from atrial fibrillation at this time.  Could consider a course of amiodarone to try to restore sinus rhythm since the duration of atrial fibrillation is less than 48 hours.  HISTORY: This 83 year old male has a prior history of some mild dementia, hypertension stage III chronic kidney disease and had needed admission to rule out a myocardial infarction about a year and a half ago.  At that time he had a mildly positive Myoview study but had no recurrence of chest pain and decision was made to treat him medically.  He has done well since that time without recurrent angina.  He does have some mild dementia.  He was admitted with confusion and Salmonella sepsis and a workup is in process.  He was in sinus rhythm on admission but developed rapid atrial fibrillation which is now rate controlled.  When I came by to see him today he was mildly confused but denies any shortness of breath and is unaware that his heart is out of rhythm.  He denies angina  normally and has no PND, orthopnea or claudication.  He does have aortic atherosclerosis noted on CT scan.  Past Medical History:  Diagnosis Date  . BPH (benign prostatic hyperplasia)   . CKD (chronic kidney disease)   . Dementia   . DM (diabetes mellitus), type 2 ([place])   . Enterococcus UTI   . Esophagitis   . GERD (gastroesophageal reflux disease)   . Hiatal hernia   . HTN (hypertension)   . Hyperlipidemia   . Migraine   . Myeloproliferative disease ([place])   . OA (osteoarthritis)   . Physical deconditioning   . Polycythemia vera(238.4) 02/20/2011  . Protein calorie malnutrition ([place])   . Spinal stenosis, lumbar   . Thrombocytopenia ([place])   . Toxic encephalopathy   . Vertigo       Past Surgical History:  Procedure Laterality Date  . LOWER LEG SOFT TISSUE TUMOR EXCISION    . TOTAL HIP ARTHROPLASTY  1993   right    Allergies:  is allergic to penicillins; aspirin; ciprofloxacin; macrodantin [nitrofurantoin macrocrystal]; other; oxycodone; sulfamethoxazole; tramadol; unisom [doxylamine succinate (sleep)]; and xanax [alprazolam].   Medications: Prior to Admission medications   Medication Sig Start Date End Date Taking? Authorizing Provider  atorvastatin (LIPITOR) 10 MG tablet Take 10 mg by mouth at bedtime.   Yes [provider]  clopidogrel (PLAVIX) 75 MG tablet Take 1 tablet (75 mg total) by mouth daily. 09/21/14  Yes Jacolyn Reedy, MD  docusate sodium (COLACE)  100 MG capsule Take 100 mg by mouth daily.   Yes [provider]  finasteride (PROSCAR) 5 MG tablet Take 5 mg by mouth at bedtime.  06/16/14  Yes [provider]  glipiZIDE (GLUCOTROL XL) 5 MG 24 hr tablet Take 5 mg by mouth daily.   Yes [provider]  isosorbide mononitrate (IMDUR) 60 MG 24 hr tablet Take 1 tablet (60 mg total) by mouth daily. 09/21/14  Yes Jacolyn Reedy, MD  lisinopril (PRINIVIL,ZESTRIL) 5 MG tablet Take 5 mg by mouth at bedtime.    Yes [provider]   metoprolol tartrate (LOPRESSOR) 50 MG tablet Take 25 mg by mouth 2 (two) times daily. 06/01/16  Yes [provider]  mirtazapine (REMERON) 15 MG tablet Take 15 mg by mouth at bedtime.   Yes [provider]  tamsulosin (FLOMAX) 0.4 MG CAPS capsule Take 0.4 mg by mouth 2 (two) times daily.    Yes [provider]  tolterodine (DETROL LA) 4 MG 24 hr capsule Take 4 mg by mouth at bedtime.   Yes [provider]  nitroGLYCERIN (NITROSTAT) 0.4 MG SL tablet Place 1 tablet (0.4 mg total) under the tongue every 5 (five) minutes x 3 doses as needed for chest pain. 09/21/14   Jacolyn Reedy, MD   Family History: Family Status  Relation Status  . Mother (Not Specified)  . Father (Not Specified)  . Brother (Not Specified)    Social History:   reports that he has never smoked. He has never used smokeless tobacco. He reports that he drinks alcohol. He reports that he does not use drugs.   Review of Systems: Other than as noted above the remainder of the review of systems is unremarkable.  Physical Exam: BP (!) 110/57   Pulse 81   Temp 97.9 F (36.6 C) (Oral)   Resp (!) 27   Ht 5\' 2"  (1.575 m)   Wt 65.3 kg (144 lb)   SpO2 94%   BMI 26.34 kg/m   General appearance: He is an elderly pleasant male whomay but is mildly confused Head: Normocephalic, without obvious abnormality, atraumatic, Balding male hair pattern Eyes: conjunctivae/corneas clear. PERRL, EOM's intact. Fundi not examined  Neck: no adenopathy, no carotid bruit, no JVD and supple, symmetrical, trachea midline Lungs: clear to auscultation bilaterally Heart: Irregular rhythm, normal S1 and S2, no S3 or S4, no murmur Abdomen: soft, non-tender; bowel sounds normal; no masses,  no organomegaly Rectal: deferred Extremities: extremities normal, atraumatic, no cyanosis or edema Pulses: 2+ and symmetric Skin: Skin color, texture, turgor normal. No rashes or lesions Neurologic: Grossly normal Psych:  Alert and oriented x 3 Labs: CBC  Recent Labs  08/11/16 1809  08/14/16 0327  WBC 7.0  < > 10.2  RBC 5.53  < > 4.83  HGB 16.6  < > 14.4  HCT 48.2  < > 41.5  PLT 81*  < > 63*  MCV 87.2  < > 85.9  MCH 30.0  < > 29.8  MCHC 34.4  < > 34.7  RDW 14.7  < > 15.1  LYMPHSABS 0.4*  --   --   MONOABS 0.3  --   --   EOSABS 0.0  --   --   BASOSABS 0.0  --   --   < > = values in this interval not displayed. CMP   Recent Labs  08/14/16 0327 08/14/16 0930  NA 135  --   K 4.4  --   CL  110  --   CO2 18*  --   GLUCOSE 109*  --   BUN 43*  --   CREATININE 1.44*  --   CALCIUM 8.0*  --   PROT  --  5.0*  ALBUMIN  --  2.2*  AST  --  28  ALT  --  26  ALKPHOS  --  45  BILITOT  --  0.9  GFRNONAA 42*  --   GFRAA 48*  --    Cardiac Panel (last 3 results)  Recent Labs  08/12/16 0745  CKTOTAL 230     Radiology:  Retrocardiac consolidation and small left pleural effusion, calcified aortic knob  EKG: EKG on admission showed sinus rhythm with first-degree AV block, repeat EKG personally reviewed by me shows atrial fibrillation with controlled response Independently reviewed by me  Signed:  W. Doristine Church MD [facility]   Cardiology Consultant  08/14/2016, 1:10 PM

## 2016-08-15 ENCOUNTER — Inpatient Hospital Stay (HOSPITAL_COMMUNITY): Payer: Medicare Other

## 2016-08-15 LAB — CBC
HCT: 40.5 % (ref 39.0–52.0)
HEMOGLOBIN: 14.1 g/dL (ref 13.0–17.0)
MCH: 29.7 pg (ref 26.0–34.0)
MCHC: 34.8 g/dL (ref 30.0–36.0)
MCV: 85.4 fL (ref 78.0–100.0)
PLATELETS: 61 10*3/uL — AB (ref 150–400)
RBC: 4.74 MIL/uL (ref 4.22–5.81)
RDW: 15.3 % (ref 11.5–15.5)
WBC: 6.8 10*3/uL (ref 4.0–10.5)

## 2016-08-15 LAB — COMPREHENSIVE METABOLIC PANEL
ALBUMIN: 2.2 g/dL — AB (ref 3.5–5.0)
ALK PHOS: 41 U/L (ref 38–126)
ALT: 22 U/L (ref 17–63)
AST: 15 U/L (ref 15–41)
Anion gap: 5 (ref 5–15)
BUN: 47 mg/dL — ABNORMAL HIGH (ref 6–20)
CALCIUM: 8.4 mg/dL — AB (ref 8.9–10.3)
CHLORIDE: 110 mmol/L (ref 101–111)
CO2: 20 mmol/L — AB (ref 22–32)
CREATININE: 1.36 mg/dL — AB (ref 0.61–1.24)
GFR calc non Af Amer: 44 mL/min — ABNORMAL LOW (ref >60)
GFR, EST AFRICAN AMERICAN: 52 mL/min — AB (ref >60)
GLUCOSE: 148 mg/dL — AB (ref 65–99)
Potassium: 3.9 mmol/L (ref 3.5–5.1)
SODIUM: 135 mmol/L (ref 135–145)
Total Bilirubin: 1 mg/dL (ref 0.3–1.2)
Total Protein: 5.2 g/dL — ABNORMAL LOW (ref 6.5–8.1)

## 2016-08-15 LAB — GLUCOSE, CAPILLARY
GLUCOSE-CAPILLARY: 162 mg/dL — AB (ref 65–99)
Glucose-Capillary: 154 mg/dL — ABNORMAL HIGH (ref 65–99)
Glucose-Capillary: 152 mg/dL — ABNORMAL HIGH (ref 65–99)
Glucose-Capillary: 192 mg/dL — ABNORMAL HIGH (ref 65–99)

## 2016-08-15 LAB — PROCALCITONIN: Procalcitonin: 26.65 ng/mL

## 2016-08-15 MED ORDER — DOXYCYCLINE HYCLATE 100 MG PO TABS
100.0000 mg | ORAL_TABLET | Freq: Two times a day (BID) | ORAL | Status: DC
  Administered 2016-08-15 – 2016-08-17 (×5): 100 mg via ORAL
  Filled 2016-08-15 (×5): qty 1

## 2016-08-15 NOTE — Progress Notes (Signed)
Modified Barium Swallow Progress Note  Patient Details  Name: Aaron Stephenson MRN: [id number] Date of Birth: 1924-07-19  Today's Date: 08/15/2016  Modified Barium Swallow completed.  Full report located under Chart Review in the Imaging Section.  Brief recommendations include the following:  Clinical Impression  Overall functional oropharyngeal swallow without aspiration or frank laryngeal penetration of any consistency tested.  He did require extra bolus of thin barium to transit tablet into pharynx.  Fortunately pt did cough during MBS frequently and barium was not visualized in larynx/pharynx.  Pt did become mildly dyspneic during testing with mild wheezing.  Suspect he may episodically aspirate due to discoordination of swallow/respirations - advised to strict precautions.  Will follow up x1 to assure tolerance.  Given his report of problems with foods and his dyspnea, would recommend dys3/thin diet for energy conservation.  Using live video, educated pt/family to findings/recommendations.  Thanks for this order.    Swallow Evaluation Recommendations       SLP Diet Recommendations: Dysphagia 3 (Mech soft) solids;Thin liquid   Liquid Administration via: Cup;Straw   Medication Administration: Whole meds with liquid   Supervision: Patient able to self feed   Compensations: Slow rate;Small sips/bites   Postural Changes: Remain semi-upright after after feeds/meals (Comment);Seated upright at 90 degrees   Oral Care Recommendations: Oral care BID       Luanna Salk, [facility] SLP [phone]

## 2016-08-15 NOTE — Progress Notes (Signed)
Subjective:  He is not complaining of shortness of breath but is still coughing.  He does have some memory issues and was perseverating some today.  Temperature is down.  Remains in atrial fibrillation.  Objective:  Vital Signs in the last 24 hours: BP 115/60 (BP Location: Right Arm)   Pulse 84   Temp 98 F (36.7 C) (Oral)   Resp (!) 22   Ht 5\' 2"  (1.575 m)   Wt 65.3 kg (144 lb)   SpO2 97%   BMI 26.34 kg/m   Physical Exam: Pleasant male in no acute distress Lungs:  Mild wheezing bilaterally  Cardiac:  Irregular irregular rhythm, normal S1 and S2, no S3 Abdomen:  Soft, nontender, no masses Extremities:  No edema present  Intake/Output from previous day: 07/02 0701 - 07/03 0700 In: 930 [P.O.:480; I.V.:150; IV Piggyback:300] Out: 1600 [Urine:1600]  Weight Filed Weights   08/11/16 1830  Weight: 65.3 kg (144 lb)    Lab Results: Basic Metabolic Panel:  Recent Labs  08/14/16 0327 08/15/16 0324  NA 135 135  K 4.4 3.9  CL 110 110  CO2 18* 20*  GLUCOSE 109* 148*  BUN 43* 47*  CREATININE 1.44* 1.36*   CBC:  Recent Labs  08/14/16 0327 08/15/16 0324  WBC 10.2 6.8  HGB 14.4 14.1  HCT 41.5 40.5  MCV 85.9 85.4  PLT 63* 61*   Telemetry: Atrial fibrillation with controlled response-personally reviewed  Assessment/Plan:  1.  Persistent atrial fibrillation 2.  Salmonella bacteremia 3.  Severe hypertensive heart disease with marked LVH 4.  Chronic systolic heart failure with mild reduction in LV function 5.  Coronary artery disease with clinical diagnosis previously 6.  Chronic kidney disease stage III 7.  Possible aspiration pneumonia  RECOMMENDATIONS:  His atrial fibrillation rate is controlled at the present time.  Evidently according to the daughter the plans are to try to arrange for him to go into a memory care unit on discharge from the hospital.  He has significant thrombocytopenia.  Ideally it would be good anticoagulate him with the atrial fibrillation  but at his age and thrombocytopenia does have increased risk of some bleeding.  The echocardiogram today shows an EF of 40-45% with diffuse hypokinesis and significant LVH.  There is a trivial pericardial effusion and a moderate left pleural effusion noted.  I think it might be reasonable to try low-dose at request and stay away from aspirin or NSAIDs.  He doesn't appear to be that symptomatic from atrial fibrillation and it may be best just to leave him with rate control if he doesn't have any significant other issues.     Kerry Hough  MD [facility]  08/15/2016, 10:13 AM

## 2016-08-15 NOTE — Progress Notes (Signed)
@IPLOG @        PROGRESS NOTE                                                                                                                                                                                                             Patient Demographics:    Aaron Stephenson, is a 83 y.o. male, DOB - 1924-10-07, TKW:[id number]  Admit date - 08/11/2016   Admitting Physician Lily Kocher, MD  Outpatient Primary MD for the patient is Vernie Shanks, MD  LOS - 4  Chief Complaint  Patient presents with  . Weakness  . Altered Mental Status       Brief Narrative   Aaron Stephenson is a 49 y.o. gentleman with a history of dementia (still lives at home with his wife), polycythemia vera, CKD 3, HTN, DM, and thrombocytopenia who presents to the ED via EMS for evaluation of increased weakness, confusion, and lethargy, Workup in the ER showed severe sepsis likely due to right foot cellulitis With right foot and ankle swelling and redness.  His blood cultures subsequently became positive for gram-negative rods and finally for salmonella, this raised the suspicion of a GI source, patient surprisingly has no GI symptoms, CT scan was done which was nonspecific, right upper quadrant ultrasound has been ordered, he may require HIDA scan as well to rule out GI source completely. He also went into A. fib night of 08-13-2016.   Subjective:   Patient in bed, appears comfortable, denies any headache, no fever, no chest pain or pressure, no shortness of breath .He again confirms he has no abdominal pain or nausea whatsoever. No focal weakness.    Assessment  & Plan :     1.Sepsis with gram-negative bacteremia. Initially came in with right foot and ankle swelling and redness, this could have been musculoskeletal with soft tissue injury and bruising, with him now having salmonella bacteremia it raises suspicion that he became septic from a GI source. He surprisingly has no GI symptoms. CT scan abdomen and  pelvis Along with right upper quadrant ultrasound were nonspecific with minimal fluid around the gallbladder, his liver enzymes are unremarkable and he has no GI symptoms including no right upper quadrant pain or nausea, he has no right upper quadrant tenderness. His case was discussed in detail by me with GI physician Dr. Ardis Hughs and ID physician Dr. Johnnye Sima. No further workup. Complete total 14 days of antibiotics, he is currently on Levaquin, stop date will be 08/24/2012. We will move him out of stepdown  today.  2. Essential hypertension. Blood pressure soft but now improving, and tinea to hold blood pressure medications.  3. Right foot and ankle. Bruising and redness persists, rash and if he has some soft tissue or ligament injury as well, x-rays and CT scan and MRI all show soft tissue infection which clinically is much better, he is on Levaquin for #1 above for his cellulitis will place him on oral doxycycline. Monitor infection closely. If responds well stop date for doxycycline will be 08/22/2016.  4. New onset paroxysmal A. fib on night of 08-13-2016. [place] vasc 2 score of 4. His primary cardiologist Dr. Wynonia Lawman is following, TSH was stable, plan is to place him on anticoagulation once sepsis and infection has improved, he will be a good cane rate for request, echocardiogram pending.  5. Dysphagia. Patient therapy following, due for modified barium swallow on 08/15/2016, currently on liquid diet.  6. CAD. Continue Plavix, statin and once blood pressure is stable will resume Imdur and beta blocker for secondary prevention.  7. ARF on CKD 3. After hydration renal function has improved and now close to his baseline.  8. BPH. Continue on alpha blocker.  9. Mild underlying dementia. At risk for delirium, family updated bedside daily, continue supportive care, minimize narcotics and benzodiazepine use.  10. DM type II. On oral hypoglycemic and had hypoglycemia in the ER due to sepsis. Received IV D50  along with IV glucagon, monitor CBGs, hold glucose lowering medications.  CBG (last 3)   Recent Labs  08/14/16 1655 08/14/16 2113 08/15/16 0910  GLUCAP 124* 127* 154*     Diet : Diet full liquid Room service appropriate? Yes; Fluid consistency: Thin    Family Communication  :  daughtersBedside daily  Code Status :  DNR  Disposition Plan  :  TBD  Consults  :  Cardiology, discussed case with ID Dr. Johnnye Sima on 08/14/2016  Procedures  :    TTE    DVT Prophylaxis  : Heparin   Lab Results  Component Value Date   PLT 61 (L) 08/15/2016    Inpatient Medications  Scheduled Meds: . atorvastatin  10 mg Oral QHS  . clopidogrel  75 mg Oral Q breakfast  . docusate sodium  100 mg Oral Daily  . fesoterodine  8 mg Oral Daily  . finasteride  5 mg Oral QHS  . heparin subcutaneous  5,000 Units Subcutaneous Q8H  . isosorbide mononitrate  30 mg Oral Daily  . metoprolol tartrate  25 mg Oral BID  . mirtazapine  15 mg Oral QHS  . tamsulosin  0.4 mg Oral BID   Continuous Infusions: . levofloxacin (LEVAQUIN) IV    . metronidazole Stopped (08/15/16 0423)   PRN Meds:.acetaminophen, gadobenate dimeglumine, ondansetron (ZOFRAN) IV  Antibiotics  :    Anti-infectives    Start     Dose/Rate Route Frequency Ordered Stop   08/15/16 1800  levofloxacin (LEVAQUIN) IVPB 750 mg     750 mg 100 mL/hr over 90 Minutes Intravenous Every 48 hours 08/14/16 1045     08/14/16 1200  metroNIDAZOLE (FLAGYL) IVPB 500 mg     500 mg 100 mL/hr over 60 Minutes Intravenous Every 8 hours 08/14/16 1045     08/13/16 1800  levofloxacin (LEVAQUIN) IVPB 750 mg  Status:  Discontinued     750 mg 100 mL/hr over 90 Minutes Intravenous Every 48 hours 08/11/16 1903 08/12/16 1133   08/13/16 1800  levofloxacin (LEVAQUIN) IVPB 750 mg     750 mg  100 mL/hr over 90 Minutes Intravenous  Once 08/13/16 1324 08/13/16 1901   08/12/16 2000  vancomycin (VANCOCIN) IVPB 750 mg/150 ml premix  Status:  Discontinued     750 mg 150  mL/hr over 60 Minutes Intravenous Every 24 hours 08/11/16 1903 08/13/16 1038   08/12/16 1200  meropenem (MERREM) 500 mg in sodium chloride 0.9 % 50 mL IVPB  Status:  Discontinued     500 mg 100 mL/hr over 30 Minutes Intravenous Every 12 hours 08/12/16 1133 08/14/16 1045   08/12/16 0200  aztreonam (AZACTAM) 1 g in dextrose 5 % 50 mL IVPB  Status:  Discontinued     1 g 100 mL/hr over 30 Minutes Intravenous Every 8 hours 08/11/16 1903 08/12/16 1133   08/11/16 1815  levofloxacin (LEVAQUIN) IVPB 750 mg     750 mg 100 mL/hr over 90 Minutes Intravenous  Once 08/11/16 1811 08/11/16 2106   08/11/16 1815  aztreonam (AZACTAM) 2 g in dextrose 5 % 50 mL IVPB     2 g 100 mL/hr over 30 Minutes Intravenous  Once 08/11/16 1811 08/11/16 1926   08/11/16 1815  vancomycin (VANCOCIN) IVPB 1000 mg/200 mL premix     1,000 mg 200 mL/hr over 60 Minutes Intravenous  Once 08/11/16 1811 08/11/16 2106         Objective:   Vitals:   08/15/16 0400 08/15/16 0600 08/15/16 0700 08/15/16 0800  BP: (!) 112/58   115/60  Pulse: 89 87 81 84  Resp: 18 (!) 22 17 (!) 22  Temp: 98 F (36.7 C)   98 F (36.7 C)  TempSrc: Oral   Oral  SpO2: 98% 97% 96% 97%  Weight:      Height:        Wt Readings from Last 3 Encounters:  08/11/16 65.3 kg (144 lb)  06/27/16 72.6 kg (160 lb)  06/16/16 72.6 kg (160 lb)     Intake/Output Summary (Last 24 hours) at 08/15/16 0940 Last data filed at 08/15/16 0800  Gross per 24 hour  Intake              780 ml  Output             1600 ml  Net             -820 ml     Physical Exam  Awake, Pleasantly confused, No new F.N deficits, Normal affect [place].AT,PERRAL Supple Neck,No JVD, No cervical lymphadenopathy appriciated.  Symmetrical Chest wall movement, Good air movement bilaterally, CTAB RRR,No Gallops,Rubs or new Murmurs, No Parasternal Heave +ve B.Sounds, Abd Soft, No tenderness, No organomegaly appriciated, No rebound - guarding or rigidity. No Cyanosis, Clubbing or edema, No  new Rash or bruise Right foot and ankle are more swollen than left side, some bruises and redness around the right foot and toes but much improved.   Data Review:    CBC  Recent Labs Lab 08/11/16 1809 08/12/16 0352 08/13/16 0340 08/14/16 0327 08/15/16 0324  WBC 7.0 6.6 8.5 10.2 6.8  HGB 16.6 14.3 14.3 14.4 14.1  HCT 48.2 42.2 41.7 41.5 40.5  PLT 81* 73* 60* 63* 61*  MCV 87.2 87.0 86.0 85.9 85.4  MCH 30.0 29.5 29.5 29.8 29.7  MCHC 34.4 33.9 34.3 34.7 34.8  RDW 14.7 14.8 15.0 15.1 15.3  LYMPHSABS 0.4*  --   --   --   --   MONOABS 0.3  --   --   --   --   EOSABS 0.0  --   --   --   --  BASOSABS 0.0  --   --   --   --     Chemistries   Recent Labs Lab 08/11/16 1809 08/12/16 0352 08/13/16 0340 08/14/16 0327 08/14/16 0930 08/15/16 0324  NA 139 136 135 135  --  135  K 5.0 4.3 4.3 4.4  --  3.9  CL 107 108 109 110  --  110  CO2 22 20* 20* 18*  --  20*  GLUCOSE 184* 70 78 109*  --  148*  BUN 38* 39* 41* 43*  --  47*  CREATININE 1.68* 1.64* 1.46* 1.44*  --  1.36*  CALCIUM 9.6 8.4* 8.1* 8.0*  --  8.4*  AST 34  --   --   --  28 15  ALT 24  --   --   --  26 22  ALKPHOS 47  --   --   --  45 41  BILITOT 2.2*  --   --   --  0.9 1.0   ------------------------------------------------------------------------------------------------------------------ No results for input(s): CHOL, HDL, LDLCALC, TRIG, CHOLHDL, LDLDIRECT in the last 72 hours.  Lab Results  Component Value Date   HGBA1C 7.4 (H) 09/19/2014   ------------------------------------------------------------------------------------------------------------------  Recent Labs  08/14/16 1038  TSH 2.248   ------------------------------------------------------------------------------------------------------------------ No results for input(s): VITAMINB12, FOLATE, FERRITIN, TIBC, IRON, RETICCTPCT in the last 72 hours.  Coagulation profile  Recent Labs Lab 08/11/16 1809  INR 1.55    No results for input(s):  DDIMER in the last 72 hours.  Cardiac Enzymes No results for input(s): CKMB, TROPONINI, MYOGLOBIN in the last 168 hours.  Invalid input(s): CK ------------------------------------------------------------------------------------------------------------------ No results found for: BNP  Micro Results Recent Results (from the past 240 hour(s))  Culture, blood (Routine x 2)     Status: Abnormal (Preliminary result)   Collection Time: 08/11/16  6:05 PM  Result Value Ref Range Status   Specimen Description BLOOD RIGHT FOREARM  Final   Special Requests   Final    BOTTLES DRAWN AEROBIC AND ANAEROBIC Blood Culture adequate volume   Culture  Setup Time   Final    GRAM NEGATIVE RODS IN BOTH AEROBIC AND ANAEROBIC BOTTLES CRITICAL RESULT CALLED TO, READ BACK BY AND VERIFIED WITH: C SHADE 08/12/16 @ 52 M VESTAL    Culture (A)  Final    SALMONELLA SPECIES RESULT CALLED TO, READ BACK BY AND VERIFIED WITH: L RUDD,RN AT 1731 08/13/16 BY L Mertens NOTIFIED REFERRED TO [facility] IN [place] IDENTIFICATION/CONFIRMATION Performed at [facility], [place] [address] [place], [place] [address]    Report Status PENDING  Incomplete   Organism ID, Bacteria SALMONELLA SPECIES  Final      Susceptibility   Salmonella species - MIC*    AMPICILLIN <=2 SENSITIVE Sensitive     LEVOFLOXACIN <=0.12 SENSITIVE Sensitive     TRIMETH/SULFA <=20 SENSITIVE Sensitive     * SALMONELLA SPECIES  Blood Culture ID Panel (Reflexed)     Status: Abnormal   Collection Time: 08/11/16  6:05 PM  Result Value Ref Range Status   Enterococcus species NOT DETECTED NOT DETECTED Final   Listeria monocytogenes NOT DETECTED NOT DETECTED Final   Staphylococcus species NOT DETECTED NOT DETECTED Final   Staphylococcus aureus NOT DETECTED NOT DETECTED Final   Streptococcus species NOT DETECTED NOT DETECTED Final   Streptococcus agalactiae NOT DETECTED NOT DETECTED Final    Streptococcus pneumoniae NOT DETECTED NOT DETECTED Final   Streptococcus pyogenes NOT DETECTED NOT  DETECTED Final   Acinetobacter baumannii NOT DETECTED NOT DETECTED Final   Enterobacteriaceae species DETECTED (A) NOT DETECTED Final    Comment: Enterobacteriaceae represent a large family of gram negative bacteria, not a single organism. Refer to culture for further identification. CRITICAL RESULT CALLED TO, READ BACK BY AND VERIFIED WITH: C SHADE 08/12/16 @ 42 M VESTAL    Enterobacter cloacae complex NOT DETECTED NOT DETECTED Final   Escherichia coli NOT DETECTED NOT DETECTED Final   Klebsiella oxytoca NOT DETECTED NOT DETECTED Final   Klebsiella pneumoniae NOT DETECTED NOT DETECTED Final   Proteus species NOT DETECTED NOT DETECTED Final   Serratia marcescens NOT DETECTED NOT DETECTED Final   Carbapenem resistance NOT DETECTED NOT DETECTED Final   Haemophilus influenzae NOT DETECTED NOT DETECTED Final   Neisseria meningitidis NOT DETECTED NOT DETECTED Final   Pseudomonas aeruginosa NOT DETECTED NOT DETECTED Final   Candida albicans NOT DETECTED NOT DETECTED Final   Candida glabrata NOT DETECTED NOT DETECTED Final   Candida krusei NOT DETECTED NOT DETECTED Final   Candida parapsilosis NOT DETECTED NOT DETECTED Final   Candida tropicalis NOT DETECTED NOT DETECTED Final    Comment: Performed at [facility], [place] [address], [place], [place] [address]  Culture, blood (Routine x 2)     Status: Abnormal   Collection Time: 08/11/16  6:13 PM  Result Value Ref Range Status   Specimen Description RIGHT ANTECUBITAL  Final   Special Requests   Final    BOTTLES DRAWN AEROBIC AND ANAEROBIC Blood Culture adequate volume   Culture  Setup Time   Final    GRAM NEGATIVE RODS IN BOTH AEROBIC AND ANAEROBIC BOTTLES CRITICAL VALUE NOTED.  VALUE IS CONSISTENT WITH PREVIOUSLY REPORTED AND CALLED VALUE.    Culture (A)  Final    SALMONELLA SPECIES RESULT CALLED TO, READ BACK BY AND VERIFIED WITH: L  RUDD,RN AT 1731 08/13/16 BY L Cole NOTIFIED REFERRED TO [facility] IN [place], [place] FOR IDENTIFICATION/CONFIRMATION SUSCEPTIBILITIES PERFORMED ON PREVIOUS CULTURE WITHIN THE LAST 5 DAYS. Performed at [facility], [place] [address] [place], [place] [address]    Report Status 08/14/2016 FINAL  Final  MRSA PCR Screening     Status: None   Collection Time: 08/12/16  3:30 AM  Result Value Ref Range Status   MRSA by PCR NEGATIVE NEGATIVE Final    Comment:        The GeneXpert MRSA Assay (FDA approved for NASAL specimens only), is one component of a comprehensive MRSA colonization surveillance program. It is not intended to diagnose MRSA infection nor to guide or monitor treatment for MRSA infections.     Radiology Reports Ct Abdomen Pelvis Wo Contrast  Result Date: 08/13/2016 CLINICAL DATA:  Sepsis, abdominal pain EXAM: CT ABDOMEN AND PELVIS WITHOUT CONTRAST TECHNIQUE: Multidetector CT imaging of the abdomen and pelvis was performed following the standard protocol without IV contrast. COMPARISON:  12/28/2009 FINDINGS: Lower chest: Small bilateral pleural effusions with bibasilar opacities likely compressive atelectasis. Mild cardiomegaly and small pericardial effusion. Hepatobiliary: Numerous hypodensities throughout the liver are stable since prior study and most compatible with cysts. Mild distention of the gallbladder. No biliary ductal dilatation. Pancreas: No focal abnormality or ductal dilatation. Spleen: No focal abnormality.  Normal size. Adrenals/Urinary Tract: Large bilateral renal cysts. No hydronephrosis. Adrenal glands and urinary bladder are unremarkable. Stomach/Bowel: Appendix is normal. Stomach, large and small bowel grossly unremarkable. Vascular/Lymphatic: Diffuse aortic and iliac calcifications. No aneurysm. Reproductive:  Mild prostate enlargement. Other: No free fluid or free air. There is a right inguinal hernia  containing distal small bowel loops without obstruction. Fluid also present within the hernia sac. Musculoskeletal: Prior right hip replacement. Lucency in the right supra-acetabular region is stable since prior study. No acute bony abnormality. IMPRESSION: Small bilateral pleural effusions and pericardial effusion. Bibasilar atelectasis. Cardiomegaly. Hepatic and bilateral renal cysts. Mild distention of the gallbladder. If there is clinical concern for cholecystitis, right upper quadrant ultrasound may be beneficial. Right inguinal hernia containing small bowel loops. No evidence of bowel obstruction. Fluid also present within the right inguinal hernia. Electronically Signed   By: Rolm Baptise M.D.   On: 08/13/2016 10:46   Dg Chest 2 View  Result Date: 08/11/2016 CLINICAL DATA:  Decreased mobility and worsening confusion for 2 days. EXAM: CHEST  2 VIEW COMPARISON:  Chest radiograph Jun 27, 2016 FINDINGS: The cardiac silhouette is moderately enlarged. Mediastinal silhouette is nonsuspicious, calcified aortic knob. Pulmonary vascular congestion. Retrocardiac consolidation with small LEFT pleural effusion. Strandy densities RIGHT lung base. Trachea deviated to the LEFT within the neck, new from prior radiograph which is likely positional. No pneumothorax. Severe bilateral glenohumeral osteoarthrosis. IMPRESSION: Retrocardiac consolidation and small LEFT pleural effusion. Followup PA and lateral chest X-ray is recommended in 3-4 weeks following trial of antibiotic therapy to ensure resolution and exclude underlying malignancy. Stable cardiomegaly and pulmonary vascular congestion. Electronically Signed   By: Elon Alas M.D.   On: 08/11/2016 18:43   Ct Head Wo Contrast  Result Date: 08/11/2016 CLINICAL DATA:  Unable to ambulate.  Confusion. EXAM: CT HEAD WITHOUT CONTRAST TECHNIQUE: Contiguous axial images were obtained from the base of the skull through the vertex without intravenous contrast.  COMPARISON:  Jun 27, 2016 FINDINGS: Brain: Evaluation is somewhat limited due to patient motion. No subdural, epidural, or subarachnoid hemorrhage. Cerebellum, brainstem, and basal cisterns are normal. No mass effect or midline shift. Prominence of sulci and ventricles consistent with volume loss. No acute cortical ischemia or infarct is identified. Vascular: No hyperdense vessel or unexpected calcification. Skull: Normal. Negative for fracture or focal lesion. Sinuses/Orbits: No acute finding. Other: None. IMPRESSION: The study is limited by motion. No acute abnormalities are identified. Electronically Signed   By: Dorise Bullion III M.D   On: 08/11/2016 20:56   Ct Ankle Right Wo Contrast  Result Date: 08/13/2016 CLINICAL DATA:  G negative sepsis and severe right foot pain and swelling. EXAM: CT OF THE RIGHT ANKLE WITHOUT CONTRAST TECHNIQUE: Multidetector CT imaging of the right ankle was performed according to the standard protocol. Multiplanar CT image reconstructions were also generated. COMPARISON:  Radiographs 08/11/2016 FINDINGS: Diffuse and marked subcutaneous soft tissue swelling/edema/ fluid involving the right lower extremity, ankle and foot. No obvious discrete drainable soft tissue abscess is identified. No definite findings for myofasciitis or pyomyositis. No CT evidence of septic arthritis or osteomyelitis. No fractures identified. IMPRESSION: Severe diffuse subcutaneous soft tissue swelling/ edema/fluid without discrete drainable soft tissue abscess, pyomyositis, septic arthritis or osteomyelitis. If symptoms persist or worsen MRI may be helpful for further evaluation and is much more sensitive for the above-mentioned entities. Electronically Signed   By: Marijo Sanes M.D.   On: 08/13/2016 10:57   Mr Ankle Right Wo Contrast  Result Date: 08/14/2016 CLINICAL DATA:  Fever with right lower extremity pain and swelling. EXAM: MRI OF THE RIGHT ANKLE WITHOUT CONTRAST TECHNIQUE: Multiplanar,  multisequence MR imaging of the ankle was performed. No intravenous contrast was administered. COMPARISON:  CT scan from yesterday FINDINGS: Examination is very limited due to patient motion. Patient would not or could not hold still. I do not see any findings suspicious for osteomyelitis or septic arthritis. There is diffuse and severe cellulitis and diffuse myositis. No obvious discrete drainable soft tissue abscess. The major ligaments and tendons are intact. IMPRESSION: 1. Limited examination due to patient motion. 2. No definite MR findings for discrete drainable soft tissue abscess or pyomyositis. There is diffuse cellulitis and myofasciitis. 3. No findings to suggest septic arthritis or osteomyelitis. Electronically Signed   By: Marijo Sanes M.D.   On: 08/14/2016 10:28   Dg Chest Port 1 View  Result Date: 08/14/2016 CLINICAL DATA:  Fever. EXAM: PORTABLE CHEST 1 VIEW COMPARISON:  08/12/2016 . FINDINGS: Mediastinum and hilar structures are normal. Cardiomegaly with normal pulmonary vascularity. Persistent left lower lobe atelectasis and infiltrate. Small bilateral pleural effusions. No pneumothorax. Degenerative changes thoracic spine. IMPRESSION: 1. Persistent left lower lobe atelectasis and infiltrate. Small bilateral pleural effusions. 2. Stable cardiomegaly. Electronically Signed   By: Marcello Moores  Register   On: 08/14/2016 06:17   Dg Chest Port 1 View  Result Date: 08/12/2016 CLINICAL DATA:  Sudden onset of sneezing this morning. Shortness of breath. EXAM: PORTABLE CHEST 1 VIEW COMPARISON:  08/11/2016 FINDINGS: Chronic cardiomegaly. Chronic aortic atherosclerosis. Left lower lobe volume loss/ infiltrate as shown yesterday. Remainder the chest is clear. No evidence of heart failure. IMPRESSION: Re- demonstration of left lower lobe atelectasis/ pneumonia. Electronically Signed   By: Nelson Chimes M.D.   On: 08/12/2016 10:13   Dg Foot Complete Right  Result Date: 08/11/2016 CLINICAL DATA:  Pain swelling  and erythema at the right lower leg. Uncertain chronicity. EXAM: RIGHT FOOT COMPLETE - 3+ VIEW COMPARISON:  None. FINDINGS: Negative for fracture or other acute bony abnormality. No bony destruction or bone lesion. Marked soft tissue swelling at the dorsum of the foot. No soft tissue gas or soft tissue foreign body. Mild arthritic changes are present, particularly at the first MTP joint and at most of the PIP joints. IMPRESSION: Mild arthritic changes of the forefoot. No acute bony abnormality or bony destruction. Electronically Signed   By: Andreas Newport M.D.   On: 08/11/2016 23:27   US Abdomen Limited Ruq  Result Date: 08/14/2016 CLINICAL DATA:  83 year old hypertensive diabetic male with fever. Initial encounter. EXAM: ULTRASOUND ABDOMEN LIMITED RIGHT UPPER QUADRANT COMPARISON:  08/13/2016 CT. FINDINGS: Gallbladder: Gallbladder sludge without discrete gallstone. Gallbladder wall thickening measuring up to 5 mm. Minimal amount of pericholecystic fluid. Per ultrasound technologist, patient was not tender over this region during scanning. Common bile duct: Diameter: 3.8 mm. Liver: Multiple liver cysts measuring up to 3 cm. 1.7 cm slightly complex cyst along the dome of the liver. No intrahepatic biliary duct dilation. Portal vein is patent. Right-sided pleural effusion. IMPRESSION: Gallbladder wall thickening with minimal amount of pericholecystic fluid. Gallbladder sludge without stone identified. Patient was not tender over this region during scanning per ultrasound technologist. It is possible findings are related to increased right heart pressure or liver dysfunction however, cholecystitis cannot be excluded in proper clinical setting. Multiple liver cysts measuring up to 3 cm. 1.7 cm cyst along the dome liver appears slightly complex. Right-sided pleural effusion. Electronically Signed   By: Genia Del M.D.   On: 08/14/2016 07:31    Time Spent in minutes  30   Lala Lund M.D on 08/15/2016 at  9:40 AM  Between 7am to 7pm - Pager - [phone] ( page  via [email or web address], text pages only, please mention full 10 digit call back number). After 7pm go to [email or web address] - password [email or web address]

## 2016-08-15 NOTE — Progress Notes (Signed)
Patients wife Britt Boozer is very frustrated with not being called about testing or any updates from MD's. Wife's number is in the chart. Patients wife also has a caregiver at home is unable to drive to come up to hospital alone.

## 2016-08-15 NOTE — Progress Notes (Signed)
  Echocardiogram 2D Echocardiogram has been performed.  Blaire Hodsdon T Masaye Gatchalian 08/15/2016, 10:01 AM

## 2016-08-16 DIAGNOSIS — L8990 Pressure ulcer of unspecified site, unspecified stage: Secondary | ICD-10-CM | POA: Insufficient documentation

## 2016-08-16 DIAGNOSIS — R7881 Bacteremia: Secondary | ICD-10-CM

## 2016-08-16 DIAGNOSIS — A021 Salmonella sepsis: Secondary | ICD-10-CM

## 2016-08-16 DIAGNOSIS — L03119 Cellulitis of unspecified part of limb: Secondary | ICD-10-CM

## 2016-08-16 DIAGNOSIS — N183 Chronic kidney disease, stage 3 (moderate): Secondary | ICD-10-CM

## 2016-08-16 DIAGNOSIS — E11628 Type 2 diabetes mellitus with other skin complications: Secondary | ICD-10-CM

## 2016-08-16 DIAGNOSIS — I872 Venous insufficiency (chronic) (peripheral): Secondary | ICD-10-CM

## 2016-08-16 DIAGNOSIS — L89152 Pressure ulcer of sacral region, stage 2: Secondary | ICD-10-CM

## 2016-08-16 DIAGNOSIS — E1121 Type 2 diabetes mellitus with diabetic nephropathy: Secondary | ICD-10-CM

## 2016-08-16 LAB — COMPREHENSIVE METABOLIC PANEL
ALT: 17 U/L (ref 17–63)
ANION GAP: 10 (ref 5–15)
AST: 13 U/L — ABNORMAL LOW (ref 15–41)
Albumin: 2.2 g/dL — ABNORMAL LOW (ref 3.5–5.0)
Alkaline Phosphatase: 51 U/L (ref 38–126)
BUN: 50 mg/dL — ABNORMAL HIGH (ref 6–20)
CHLORIDE: 110 mmol/L (ref 101–111)
CO2: 19 mmol/L — AB (ref 22–32)
CREATININE: 1.41 mg/dL — AB (ref 0.61–1.24)
Calcium: 8.8 mg/dL — ABNORMAL LOW (ref 8.9–10.3)
GFR, EST AFRICAN AMERICAN: 49 mL/min — AB (ref >60)
GFR, EST NON AFRICAN AMERICAN: 43 mL/min — AB (ref >60)
Glucose, Bld: 173 mg/dL — ABNORMAL HIGH (ref 65–99)
POTASSIUM: 4 mmol/L (ref 3.5–5.1)
SODIUM: 139 mmol/L (ref 135–145)
Total Bilirubin: 0.8 mg/dL (ref 0.3–1.2)
Total Protein: 5.2 g/dL — ABNORMAL LOW (ref 6.5–8.1)

## 2016-08-16 LAB — CBC
HCT: 42.3 % (ref 39.0–52.0)
Hemoglobin: 14.6 g/dL (ref 13.0–17.0)
MCH: 29.6 pg (ref 26.0–34.0)
MCHC: 34.5 g/dL (ref 30.0–36.0)
MCV: 85.8 fL (ref 78.0–100.0)
Platelets: 64 10*3/uL — ABNORMAL LOW (ref 150–400)
RBC: 4.93 MIL/uL (ref 4.22–5.81)
RDW: 15.4 % (ref 11.5–15.5)
WBC: 5.9 10*3/uL (ref 4.0–10.5)

## 2016-08-16 LAB — GLUCOSE, CAPILLARY
GLUCOSE-CAPILLARY: 154 mg/dL — AB (ref 65–99)
GLUCOSE-CAPILLARY: 164 mg/dL — AB (ref 65–99)
GLUCOSE-CAPILLARY: 124 mg/dL — AB (ref 65–99)
Glucose-Capillary: 109 mg/dL — ABNORMAL HIGH (ref 65–99)

## 2016-08-16 LAB — PROCALCITONIN: PROCALCITONIN: 10.77 ng/mL

## 2016-08-16 MED ORDER — INSULIN ASPART 100 UNIT/ML ~~LOC~~ SOLN: 0.0000 [IU] | Freq: Every day | SUBCUTANEOUS | Status: DC

## 2016-08-16 MED ORDER — LEVOFLOXACIN 750 MG PO TABS
750.0000 mg | ORAL_TABLET | ORAL | Status: DC
  Administered 2016-08-17: 750 mg via ORAL
  Filled 2016-08-16: qty 1

## 2016-08-16 MED ORDER — INSULIN ASPART 100 UNIT/ML ~~LOC~~ SOLN
0.0000 [IU] | Freq: Three times a day (TID) | SUBCUTANEOUS | Status: DC
  Administered 2016-08-16: 1 [IU] via SUBCUTANEOUS
  Administered 2016-08-16 – 2016-08-17 (×2): 2 [IU] via SUBCUTANEOUS
  Administered 2016-08-17: 1 [IU] via SUBCUTANEOUS

## 2016-08-16 MED ORDER — APIXABAN 2.5 MG PO TABS
2.5000 mg | ORAL_TABLET | Freq: Two times a day (BID) | ORAL | Status: DC
  Administered 2016-08-16 – 2016-08-17 (×3): 2.5 mg via ORAL
  Filled 2016-08-16 (×3): qty 1

## 2016-08-16 NOTE — Progress Notes (Signed)
Physical Therapy Evaluation Patient Details Name: Aaron Stephenson MRN: [id number] DOB: 1924-07-10 Today's Date: 08/16/2016   History of Present Illness  Pt is a 83 y.o. gentleman with a history of dementia (still lives at home with his wife), polycythemia vera, CKD 3, HTN, DM, and thrombocytopenia. Pt has had subjective fevers and chills at home. and is currently being seen for Sepsis.  Clinical Impression  Pt admitted with sepsis. Pt currently with functional limitations due to the deficits below. Pt will benefit from skilled PT to increase their independence and safety with mobility to allow discharge.     Follow Up Recommendations SNF    Equipment Recommendations  None recommended by PT    Recommendations for Other Services       Precautions / Restrictions Precautions Precautions: Fall Restrictions Weight Bearing Restrictions: No      Mobility  Bed Mobility Overal bed mobility: Needs Assistance Bed Mobility: Sit to Supine       Sit to supine: HOB elevated;Min assist   General bed mobility comments: assist for R leg pain  Transfers Overall transfer level: Needs assistance Equipment used: Rolling walker (2 wheeled) Transfers: Sit to/from Stand Sit to Stand: Min assist         General transfer comment: verbal cues for hand placement, forward weight shift, assist for rise and steady  Ambulation/Gait Ambulation/Gait assistance: Min assist Ambulation Distance (Feet): 5 Feet Assistive device: Rolling walker (2 wheeled) Gait Pattern/deviations: Step-to pattern;Decreased stride length;Antalgic     General Gait Details: slow gait, antalgic gait patter due to R leg pain and edema, verbal cues for weight bearing through RW for better pain control  Stairs            Wheelchair Mobility    Modified Rankin (Stroke Patients Only)       Balance Overall balance assessment: Needs assistance (pt denies falls)         Standing balance support: Bilateral  upper extremity supported Standing balance-Leahy Scale: Poor Standing balance comment: UEs through RW for support                             Pertinent Vitals/Pain Pain Assessment: Faces Faces Pain Scale: Hurts little more Pain Location: Right foot Pain Descriptors / Indicators: Grimacing Pain Intervention(s): Monitored during session;Repositioned    Home Living Family/patient expects to be discharged to:: Private residence Living Arrangements: Spouse/significant other Available Help at Discharge: Personal care attendant Type of Home: House Home Access: Level entry     Home Layout: One level Home Equipment: Environmental consultant - 2 wheels;Cane - single point Additional Comments: Pt lives with spouse and spouse has personal care attendant.     Prior Function Level of Independence: Independent with assistive device(s)         Comments: Pt has been using SPC, spouse assists with care      Hand Dominance        Extremity/Trunk Assessment        Lower Extremity Assessment Lower Extremity Assessment: Generalized weakness;RLE deficits/detail RLE Deficits / Details: R lower leg edema        Communication   Communication: HOH  Cognition Arousal/Alertness: Awake/alert Behavior During Therapy: WFL for tasks assessed/performed Overall Cognitive Status: History of cognitive impairments - at baseline                                 General  Comments: perseverating on Dr. Wynonia Lawman and change of medication during session      General Comments      Exercises     Assessment/Plan    PT Assessment Patient needs continued PT services  PT Problem List Pain;Decreased strength;Decreased activity tolerance;Decreased mobility;Decreased balance;Decreased knowledge of use of DME;Decreased safety awareness       PT Treatment Interventions Gait training;Therapeutic exercise;Patient/family education;DME instruction;Therapeutic activities;Functional mobility training     PT Goals (Current goals can be found in the Care Plan section)  Acute Rehab PT Goals PT Goal Formulation: With patient/family Time For Goal Achievement: 08/30/16 Potential to Achieve Goals: Good    Frequency Min 2X/week   Barriers to discharge        Co-evaluation               AM-PAC PT "6 Clicks" Daily Activity  Outcome Measure Difficulty turning over in bed (including adjusting bedclothes, sheets and blankets)?: A Lot Difficulty moving from lying on back to sitting on the side of the bed? : Total Difficulty sitting down on and standing up from a chair with arms (e.g., wheelchair, bedside commode, etc,.)?: Total Help needed moving to and from a bed to chair (including a wheelchair)?: A Lot Help needed walking in hospital room?: A Lot Help needed climbing 3-5 steps with a railing? : A Lot 6 Click Score: 10    End of Session Equipment Utilized During Treatment: Gait belt Activity Tolerance: Patient limited by pain Patient left: in bed;with call bell/phone within reach;with family/visitor present;with bed alarm set Nurse Communication: Mobility status PT Visit Diagnosis: Unsteadiness on feet (R26.81);Other abnormalities of gait and mobility (R26.89)    Time: 1413-1440 PT Time Calculation (min) (ACUTE ONLY): 27 min   Charges:   PT Evaluation $PT Eval Moderate Complexity: 1 Procedure     PT G Codes:        Olegario Shearer, SPT  Reino Bellis 08/16/2016, 4:01 PM

## 2016-08-16 NOTE — Progress Notes (Signed)
[facility] PROGRESS NOTE    Progress Note  Aaron Stephenson  IZT:[id number] DOB: March 09, 1924 DOA: 08/11/2016 PCP: Aaron Shanks, MD     Brief Narrative:   Aaron Stephenson is an 83 y.o. male [place] with past medical history of advanced dementia, chronic kidney disease stage III, thrombocytopenia came into the ED with severe sepsis due to right foot cellulitis. Blood cultures were positive for gram-negative rods which grew Salmonella. And developed A. fib and RVR and 08/13/2016.  Assessment/Plan:   Sepsis ([place]) Due to salmonella bacteremia: Initially came in with possible right lower foot cellulitis due to the Salmonella bacteremia sepsis there is concern for a GI source, surprisingly he doesn't have any GI symptoms CT scan of the abdomen and pelvis and ultrasound showed no signs of acute cholecystitis, liver enzymes are unremarkable he denies any diarrhea or abdominal pain. Previous physician discuss with ID and GI recommended no further workup and to complete a 14 day course of antibiotics. Stopped date 08/24/2016.  Essential hypertension: Blood pressure is improving slowly.  Right foot and ankle pain cellulitis. MRI so soft tissue infection, will continue Doxy. Stop date 08/22/2016.  New-onset paroxysmal atrial fibrillation: [place] vasc 2 score of 4. His primary cardiologist Dr. Wynonia Stephenson. Cardiology recommended to start Eluquis.  Dysphagia: Swallowing evaluation done 72,019 continue current liquid diet.  Coronary artery Disease: Continue beta blocker and statins. D/c Telemetry chest pain-free. Echo showed no wall motion abnormality with a reduced EF of 40% and mild aortic and mitral regurgitation. Mild dilation of all 4 chambers.  Chronic renal disease stage III: Creatinine seems to be at baseline.  Type 2 diabetes mellitus with renal manifestations ([place]) On further episodes of hypoglycemia. Continue hold oral hypoglycemic, start sliding scale insulin.      Pressure injury of skin: WOC    DVT prophylaxis: eluquis Family Communication:daughters Disposition Plan/Barrier to D/C: SNF in am Code Status:     Code Status Orders        Start     Ordered   08/11/16 2305  Do not attempt resuscitation (DNR)  Continuous    Question Answer Comment  In the event of cardiac or respiratory ARREST Do not call a "code blue"   In the event of cardiac or respiratory ARREST Do not perform Intubation, CPR, defibrillation or ACLS   In the event of cardiac or respiratory ARREST Use medication by any route, position, wound care, and other measures to relive pain and suffering. May use oxygen, suction and manual treatment of airway obstruction as needed for comfort.      08/11/16 2306    Code Status History    Date Active Date Inactive Code Status Order ID Comments User Context   08/11/2016  9:22 PM 08/11/2016 11:06 PM DNR [id number]  Aaron Sorrow, MD ED   07/21/2015  4:19 PM 07/24/2015  7:09 PM DNR [id number]  Aaron Public, MD ED   07/21/2015  3:24 PM 07/21/2015  4:19 PM DNR [id number]  Aaron Public, MD ED   09/19/2014  8:59 AM 09/21/2014  2:41 PM Full Code [id number]  Aaron Serge, NP Inpatient    Advance Directive Documentation     Most Recent Value  Type of Advance Directive  Healthcare Power of Attorney, Living will  Pre-existing out of facility DNR order (yellow form or pink MOST form)  -  "MOST" Form in Place?  -        IV Access:    Peripheral IV   Procedures  and diagnostic studies:   Mr Ankle Right Wo Contrast  Result Date: 08/14/2016 CLINICAL DATA:  Fever with right lower extremity pain and swelling. EXAM: MRI OF THE RIGHT ANKLE WITHOUT CONTRAST TECHNIQUE: Multiplanar, multisequence MR imaging of the ankle was performed. No intravenous contrast was administered. COMPARISON:  CT scan from yesterday FINDINGS: Examination is very limited due to patient motion. Patient would not or could not hold still. I do not see any findings  suspicious for osteomyelitis or septic arthritis. There is diffuse and severe cellulitis and diffuse myositis. No obvious discrete drainable soft tissue abscess. The major ligaments and tendons are intact. IMPRESSION: 1. Limited examination due to patient motion. 2. No definite MR findings for discrete drainable soft tissue abscess or pyomyositis. There is diffuse cellulitis and myofasciitis. 3. No findings to suggest septic arthritis or osteomyelitis. Electronically Signed   By: Marijo Sanes M.D.   On: 08/14/2016 10:28   Dg Swallowing Func-speech Pathology  Result Date: 08/15/2016 Objective Swallowing Evaluation: Type of Study: MBS-Modified Barium Swallow Study Patient Details Name: Aaron Stephenson MRN: [id number] Date of Birth: Dec 28, 1924 Today's Date: 08/15/2016 Time: SLP Start Time (ACUTE ONLY): 1135-SLP Stop Time (ACUTE ONLY): 1159 SLP Time Calculation (min) (ACUTE ONLY): 24 min Past Medical History: Past Medical History: Diagnosis Date . BPH (benign prostatic hyperplasia)  . CKD (chronic kidney disease)  . Dementia  . DM (diabetes mellitus), type 2 ([place])  . Enterococcus UTI  . Esophagitis  . GERD (gastroesophageal reflux disease)  . Hiatal hernia  . HTN (hypertension)  . Hyperlipidemia  . Migraine  . Myeloproliferative disease ([place])  . OA (osteoarthritis)  . Physical deconditioning  . Polycythemia vera(238.4) 02/20/2011 . Protein calorie malnutrition ([place])  . Spinal stenosis, lumbar  . Thrombocytopenia ([place])  . Toxic encephalopathy  . Vertigo  Past Surgical History: Past Surgical History: Procedure Laterality Date . LOWER LEG SOFT TISSUE TUMOR EXCISION   . TOTAL HIP ARTHROPLASTY  1993  right HPI: Pt is an 83 y.o. male with PMH of dementia (still lives at home with his wife), polycythemia vera, CKD 3, HTN, DM, and thrombocytopenia who presents to the ED via EMS for evaluation of increased weakness, confusion, and lethargy, Workup in the ER showed severe sepsis likely due to right foot cellulitis and  gram-negative bacteria also pneumonia was also in consideration. CXR showed LLL atelectasis/ PNA. Pt followed briefly by speech therapy in June 2017 and was noted to be impacted by confusion; recommendation at that time was dysphagia 3/ thin liquids. Bedside swallow eval ordered.  Subjective: pt awake in chair Assessment / Plan / Recommendation CHL IP CLINICAL IMPRESSIONS 08/15/2016 Clinical Impression Overall functional oropharyngeal swallow without aspiration or frank laryngeal penetration of any consistency tested.  He did require extra bolus of thin barium to transit tablet into pharynx.  Fortunately pt did cough during MBS frequently and barium was not visualized in larynx/pharynx.  Pt did become mildly dyspneic during testing with mild wheezing.  Suspect he may episodically aspirate due to discoordination of swallow/respirations - advised to strict precautions.  Will follow up x1 to assure tolerance.  Given his report of problems with foods and his dyspnea, would recommend dys3/thin diet for energy conservation.  Using live video, educated pt/family to findings/recommendations.  Thanks for this order.  SLP Visit Diagnosis Dysphagia, unspecified (R13.10) Attention and concentration deficit following -- Frontal lobe and executive function deficit following -- Impact on safety and function Mild aspiration risk   CHL IP TREATMENT RECOMMENDATION 08/15/2016 Treatment Recommendations Therapy  as outlined in treatment plan below   Prognosis 08/15/2016 Prognosis for Safe Diet Advancement Good Barriers to Reach Goals Cognitive deficits Barriers/Prognosis Comment -- CHL IP DIET RECOMMENDATION 08/15/2016 SLP Diet Recommendations Dysphagia 3 (Mech soft) solids;Thin liquid Liquid Administration via Cup;Straw Medication Administration Whole meds with liquid Compensations Slow rate;Small sips/bites Postural Changes Remain semi-upright after after feeds/meals (Comment);Seated upright at 90 degrees   CHL IP OTHER RECOMMENDATIONS  08/15/2016 Recommended Consults -- Oral Care Recommendations Oral care BID Other Recommendations --   CHL IP FOLLOW UP RECOMMENDATIONS 08/14/2016 Follow up Recommendations (No Data)   CHL IP FREQUENCY AND DURATION 08/15/2016 Speech Therapy Frequency (ACUTE ONLY) min 1 x/week Treatment Duration 1 week      CHL IP ORAL PHASE 08/15/2016 Oral Phase WFL Oral - Pudding Teaspoon -- Oral - Pudding Cup -- Oral - Honey Teaspoon -- Oral - Honey Cup -- Oral - Nectar Teaspoon -- Oral - Nectar Cup WFL Oral - Nectar Straw -- Oral - Thin Teaspoon WFL Oral - Thin Cup WFL Oral - Thin Straw WFL Oral - Puree WFL;Premature spillage Oral - Mech Soft -- Oral - Regular WFL;Premature spillage Oral - Multi-Consistency -- Oral - Pill Delayed oral transit;Decreased bolus cohesion Oral Phase - Comment --  CHL IP PHARYNGEAL PHASE 08/15/2016 Pharyngeal Phase Impaired Pharyngeal- Pudding Teaspoon -- Pharyngeal -- Pharyngeal- Pudding Cup -- Pharyngeal -- Pharyngeal- Honey Teaspoon -- Pharyngeal -- Pharyngeal- Honey Cup -- Pharyngeal -- Pharyngeal- Nectar Teaspoon -- Pharyngeal -- Pharyngeal- Nectar Cup WFL Pharyngeal -- Pharyngeal- Nectar Straw -- Pharyngeal -- Pharyngeal- Thin Teaspoon WFL Pharyngeal -- Pharyngeal- Thin Cup WFL;Penetration/Aspiration during swallow Pharyngeal Material enters airway, remains ABOVE vocal cords then ejected out Pharyngeal- Thin Straw WFL;Penetration/Aspiration during swallow Pharyngeal Material enters airway, remains ABOVE vocal cords then ejected out Pharyngeal- Puree WFL Pharyngeal -- Pharyngeal- Mechanical Soft -- Pharyngeal -- Pharyngeal- Regular Delayed swallow initiation-vallecula Pharyngeal -- Pharyngeal- Multi-consistency -- Pharyngeal -- Pharyngeal- Pill WFL Pharyngeal -- Pharyngeal Comment --  CHL IP CERVICAL ESOPHAGEAL PHASE 08/15/2016 Cervical Esophageal Phase WFL Pudding Teaspoon -- Pudding Cup -- Honey Teaspoon -- Honey Cup -- Nectar Teaspoon -- Nectar Cup -- Nectar Straw -- Thin Teaspoon -- Thin Cup -- Thin Straw  -- Puree -- Mechanical Soft -- Regular -- Multi-consistency -- Pill -- Cervical Esophageal Comment -- CHL IP GO 07/22/2015 Functional Assessment Tool Used NOMS Functional Limitations Swallowing Swallow Current Status (P3790) CJ Swallow Goal Status (W4097) [place] Swallow Discharge Status (D5329) (None) Motor Speech Current Status (J2426) (None) Motor Speech Goal Status (S3419) (None) Motor Speech Goal Status (Q2229) (None) Spoken Language Comprehension Current Status (N9892) (None) Spoken Language Comprehension Goal Status (J1941) (None) Spoken Language Comprehension Discharge Status (D4081) (None) Spoken Language Expression Current Status (K4818) (None) Spoken Language Expression Goal Status (H6314) (None) Spoken Language Expression Discharge Status [phone]) (None) Attention Current Status (V7858) (None) Attention Goal Status (I5027) (None) Attention Discharge Status (X4128) (None) Memory Current Status (N8676) (None) Memory Goal Status (H2094) (None) Memory Discharge Status (B0962) (None) Voice Current Status (E3662) (None) Voice Goal Status (H4765) (None) Voice Discharge Status (Y6503) (None) Other Speech-Language Pathology Functional Limitation Current Status (T4656) (None) Other Speech-Language Pathology Functional Limitation Goal Status (C1275) (None) Other Speech-Language Pathology Functional Limitation Discharge Status [phone]) (None) Claudie Fisherman, [place] [facility] SLP [phone]                Medical Consultants:    None.  Anti-Infectives:   Levaquin and doxycycline  Subjective:    [place] no new complaints.  Objective:    Vitals:   08/15/16 1400 08/15/16 1533 08/15/16 2016 08/16/16 0518  BP:  103/73 (!) 157/82 112/73  Pulse: 78 81 93 83  Resp: (!) 23 20 19 19   Temp:  97.9 F (36.6 C) 98 F (36.7 C) 98.2 F (36.8 C)  TempSrc:  Oral Oral Oral  SpO2: 96% 95% 99% 97%  Weight:      Height:  5\' 5"  (1.651 m)      Intake/Output Summary (Last 24 hours) at 08/16/16  0741 Last data filed at 08/15/16 2355  Gross per 24 hour  Intake                0 ml  Output              850 ml  Net             -850 ml   Filed Weights   08/11/16 1830  Weight: 65.3 kg (144 lb)    Exam: General exam: In no acute distress Respiratory system: Good air movement and clear to auscultation. Cardiovascular system: Regular rate and rhythm with positive S1-S2 no murmurs rubs gallops no JVD. Gastrointestinal system: Positive bowel sounds, soft nontender nondistended Central nervous system: Awake alert and oriented 3 Extremities: No lower extremity edema Psychiatry: Judgement and insight appear normal. Mood & affect appropriate.    Data Reviewed:    Labs: Basic Metabolic Panel:  Recent Labs Lab 08/12/16 0352 08/13/16 0340 08/14/16 0327 08/15/16 0324 08/16/16 0540  NA 136 135 135 135 139  K 4.3 4.3 4.4 3.9 4.0  CL 108 109 110 110 110  CO2 20* 20* 18* 20* 19*  GLUCOSE 70 78 109* 148* 173*  BUN 39* 41* 43* 47* 50*  CREATININE 1.64* 1.46* 1.44* 1.36* 1.41*  CALCIUM 8.4* 8.1* 8.0* 8.4* 8.8*   GFR Estimated Creatinine Clearance: 30.9 mL/min (A) (by C-G formula based on SCr of 1.41 mg/dL (H)). Liver Function Tests:  Recent Labs Lab 08/11/16 1809 08/14/16 0930 08/15/16 0324 08/16/16 0540  AST 34 28 15 13*  ALT 24 26 22 17   ALKPHOS 47 45 41 51  BILITOT 2.2* 0.9 1.0 0.8  PROT 7.0 5.0* 5.2* 5.2*  ALBUMIN 3.7 2.2* 2.2* 2.2*   No results for input(s): LIPASE, AMYLASE in the last 168 hours. No results for input(s): AMMONIA in the last 168 hours. Coagulation profile  Recent Labs Lab 08/11/16 1809  INR 1.55    CBC:  Recent Labs Lab 08/11/16 1809 08/12/16 0352 08/13/16 0340 08/14/16 0327 08/15/16 0324 08/16/16 0540  WBC 7.0 6.6 8.5 10.2 6.8 5.9  NEUTROABS 6.3  --   --   --   --   --   HGB 16.6 14.3 14.3 14.4 14.1 14.6  HCT 48.2 42.2 41.7 41.5 40.5 42.3  MCV 87.2 87.0 86.0 85.9 85.4 85.8  PLT 81* 73* 60* 63* 61* 64*   Cardiac  Enzymes:  Recent Labs Lab 08/12/16 0745  CKTOTAL 230   BNP (last 3 results) No results for input(s): PROBNP in the last 8760 hours. CBG:  Recent Labs Lab 08/15/16 0910 08/15/16 1217 08/15/16 1642 08/15/16 2152 08/16/16 0718  GLUCAP 154* 162* 152* 192* 154*   D-Dimer: No results for input(s): DDIMER in the last 72 hours. Hgb A1c: No results for input(s): HGBA1C in the last 72 hours. Lipid Profile: No results for input(s): CHOL, HDL, LDLCALC, TRIG, CHOLHDL, LDLDIRECT in the last 72 hours. Thyroid function studies:  Recent Labs  08/14/16 1038  TSH  2.248   Anemia work up: No results for input(s): VITAMINB12, FOLATE, FERRITIN, TIBC, IRON, RETICCTPCT in the last 72 hours. Sepsis Labs:  Recent Labs Lab 08/11/16 2110 08/11/16 2352 08/11/16 2356 08/12/16 0352 08/12/16 0745 08/13/16 0340 08/13/16 0813 08/14/16 0327 08/15/16 0324 08/16/16 0540  PROCALCITON  --  32.96  --   --   --   --  35.73  --  26.65 10.77  WBC  --   --   --  6.6  --  8.5  --  10.2 6.8 5.9  LATICACIDVEN 3.16*  --  2.7* 2.4* 1.9  --   --   --   --   --    Microbiology Recent Results (from the past 240 hour(s))  Culture, blood (Routine x 2)     Status: Abnormal (Preliminary result)   Collection Time: 08/11/16  6:05 PM  Result Value Ref Range Status   Specimen Description BLOOD RIGHT FOREARM  Final   Special Requests   Final    BOTTLES DRAWN AEROBIC AND ANAEROBIC Blood Culture adequate volume   Culture  Setup Time   Final    GRAM NEGATIVE RODS IN BOTH AEROBIC AND ANAEROBIC BOTTLES CRITICAL RESULT CALLED TO, READ BACK BY AND VERIFIED WITH: C SHADE 08/12/16 @ 12 M VESTAL    Culture (A)  Final    SALMONELLA SPECIES RESULT CALLED TO, READ BACK BY AND VERIFIED WITH: L RUDD,RN AT 1731 08/13/16 BY L Dupo NOTIFIED REFERRED TO [place] IN [place] IDENTIFICATION/CONFIRMATION Performed at [facility], [address],  [place], [place] [address]    Report Status PENDING  Incomplete   Organism ID, Bacteria SALMONELLA SPECIES  Final      Susceptibility   Salmonella species - MIC*    AMPICILLIN <=2 SENSITIVE Sensitive     LEVOFLOXACIN <=0.12 SENSITIVE Sensitive     TRIMETH/SULFA <=20 SENSITIVE Sensitive     * SALMONELLA SPECIES  Blood Culture ID Panel (Reflexed)     Status: Abnormal   Collection Time: 08/11/16  6:05 PM  Result Value Ref Range Status   Enterococcus species NOT DETECTED NOT DETECTED Final   Listeria monocytogenes NOT DETECTED NOT DETECTED Final   Staphylococcus species NOT DETECTED NOT DETECTED Final   Staphylococcus aureus NOT DETECTED NOT DETECTED Final   Streptococcus species NOT DETECTED NOT DETECTED Final   Streptococcus agalactiae NOT DETECTED NOT DETECTED Final   Streptococcus pneumoniae NOT DETECTED NOT DETECTED Final   Streptococcus pyogenes NOT DETECTED NOT DETECTED Final   Acinetobacter baumannii NOT DETECTED NOT DETECTED Final   Enterobacteriaceae species DETECTED (A) NOT DETECTED Final    Comment: Enterobacteriaceae represent a large family of gram negative bacteria, not a single organism. Refer to culture for further identification. CRITICAL RESULT CALLED TO, READ BACK BY AND VERIFIED WITH: C SHADE 08/12/16 @ 26 M VESTAL    Enterobacter cloacae complex NOT DETECTED NOT DETECTED Final   Escherichia coli NOT DETECTED NOT DETECTED Final   Klebsiella oxytoca NOT DETECTED NOT DETECTED Final   Klebsiella pneumoniae NOT DETECTED NOT DETECTED Final   Proteus species NOT DETECTED NOT DETECTED Final   Serratia marcescens NOT DETECTED NOT DETECTED Final   Carbapenem resistance NOT DETECTED NOT DETECTED Final   Haemophilus influenzae NOT DETECTED NOT DETECTED Final   Neisseria meningitidis NOT DETECTED NOT DETECTED Final   Pseudomonas aeruginosa NOT DETECTED NOT DETECTED Final   Candida albicans NOT DETECTED NOT DETECTED Final   Candida glabrata NOT  DETECTED NOT DETECTED Final    Candida krusei NOT DETECTED NOT DETECTED Final   Candida parapsilosis NOT DETECTED NOT DETECTED Final   Candida tropicalis NOT DETECTED NOT DETECTED Final    Comment: Performed at [facility], [place] [address], [place], [place] [address]  Culture, blood (Routine x 2)     Status: Abnormal   Collection Time: 08/11/16  6:13 PM  Result Value Ref Range Status   Specimen Description RIGHT ANTECUBITAL  Final   Special Requests   Final    BOTTLES DRAWN AEROBIC AND ANAEROBIC Blood Culture adequate volume   Culture  Setup Time   Final    GRAM NEGATIVE RODS IN BOTH AEROBIC AND ANAEROBIC BOTTLES CRITICAL VALUE NOTED.  VALUE IS CONSISTENT WITH PREVIOUSLY REPORTED AND CALLED VALUE.    Culture (A)  Final    SALMONELLA SPECIES RESULT CALLED TO, READ BACK BY AND VERIFIED WITH: L RUDD,RN AT 1731 08/13/16 BY L [place] NOTIFIED REFERRED TO [facility] IN [place], [place] FOR IDENTIFICATION/CONFIRMATION SUSCEPTIBILITIES PERFORMED ON PREVIOUS CULTURE WITHIN THE LAST 5 DAYS. Performed at [facility], [place] [address] [place], [place] [address]    Report Status 08/14/2016 FINAL  Final  MRSA PCR Screening     Status: None   Collection Time: 08/12/16  3:30 AM  Result Value Ref Range Status   MRSA by PCR NEGATIVE NEGATIVE Final    Comment:        The GeneXpert MRSA Assay (FDA approved for NASAL specimens only), is one component of a comprehensive MRSA colonization surveillance program. It is not intended to diagnose MRSA infection nor to guide or monitor treatment for MRSA infections.      Medications:   . atorvastatin  10 mg Oral QHS  . clopidogrel  75 mg Oral Q breakfast  . docusate sodium  100 mg Oral Daily  . doxycycline  100 mg Oral Q12H  . fesoterodine  8 mg Oral Daily  . finasteride  5 mg Oral QHS  . heparin subcutaneous  5,000 Units Subcutaneous Q8H  . isosorbide mononitrate  30 mg Oral Daily  . metoprolol tartrate  25 mg Oral  BID  . mirtazapine  15 mg Oral QHS  . tamsulosin  0.4 mg Oral BID   Continuous Infusions: . levofloxacin (LEVAQUIN) IV Stopped (08/15/16 1900)     LOS: 5 days   Charlynne Cousins  [facility] Pager [phone]  *Please refer to [email or web address], password [email or web address] to get updated schedule on who will round on this patient, as hospitalists switch teams weekly. If 7PM-7AM, please contact night-coverage at [email or web address], password [email or web address] for any overnight needs.  08/16/2016, 7:41 AM

## 2016-08-16 NOTE — Progress Notes (Signed)
Pt verbalized how he wished he could just pee and feel better, when he voids in the urinal the amt is <100cc  Bladder scan done showing approx 330cc urine residual

## 2016-08-16 NOTE — Progress Notes (Signed)
[name] following for patient discharge needs. Patient family is interested in SNF placement. [name] met with family at bedside, explain role. Patient family receptive to [name] assistance in SNF search. Family provided [name] with list of facilities they are interested in at this time. [name] explain at this time physical therapy is pending, we will need evaluation to help determine pt. Disposition.  Family reports understanding. Will continue to follow for needs.    Kathrin Greathouse, Latanya Presser, MSW Clinical Social Worker 5E and Psychiatric Service Line [phone] 08/16/2016  10:48 AM

## 2016-08-16 NOTE — Progress Notes (Signed)
PT Cancellation Note  Patient Details Name: Aaron Stephenson MRN: [id number] DOB: Jun 09, 1924   Cancelled Treatment:    Reason Eval/Treat Not Completed: Other (comment) (pt fatigued and requesting to rest, will check back this afternoon)   Lamyia Cdebaca,KATHrine E 08/16/2016, 11:04 AM Carmelia Bake, PT, DPT 08/16/2016 Pager: [phone]

## 2016-08-16 NOTE — Discharge Instructions (Signed)

## 2016-08-16 NOTE — Progress Notes (Signed)
Subjective:  Currently being bathed.  Lying quietly in bed and is oriented to date and place.  Has had intermittent confusion and sundowning.  His daughter's plan to have him placed in a memory care unit on discharge.  Continues with some erythema of his lower extremity no complaints of shortness of breath or chest pain.  Objective:  Vital Signs in the last 24 hours: BP 113/64   Pulse 83   Temp 98.2 F (36.8 C) (Oral)   Resp 19   Ht 5\' 5"  (1.651 m)   Wt 65.3 kg (144 lb)   SpO2 97%   BMI 26.34 kg/m   Physical Exam: Pleasant male in no acute distress Lungs:  Clear today Cardiac:  Irregular irregular rhythm, normal S1 and S2, no S3 Abdomen:  Soft, nontender, no masses Extremities: Right foot with some erythema and mild edema on the lateral aspect of foot   Intake/Output from previous day: 07/03 0701 - 07/04 0700 In: 0  Out: 850 [Urine:850]  Weight Filed Weights   08/11/16 1830  Weight: 65.3 kg (144 lb)    Lab Results: Basic Metabolic Panel:  Recent Labs  08/15/16 0324 08/16/16 0540  NA 135 139  K 3.9 4.0  CL 110 110  CO2 20* 19*  GLUCOSE 148* 173*  BUN 47* 50*  CREATININE 1.36* 1.41*   CBC:  Recent Labs  08/15/16 0324 08/16/16 0540  WBC 6.8 5.9  HGB 14.1 14.6  HCT 40.5 42.3  MCV 85.4 85.8  PLT 61* 64*   Telemetry: Currently not on telemetry  Assessment/Plan:  1.  Persistent atrial fibrillation 2.  Salmonella bacteremia 3.  Severe hypertensive heart disease with marked LVH 4.  Chronic systolic heart failure with mild reduction in LV function 5.  Coronary artery disease with clinical diagnosis previously 6.  Chronic kidney disease stage III 7.  Possible aspiration pneumonia  RECOMMENDATIONS:  He is certainly not symptomatic and planning to go to a memory care unit.  I would favor initiation of Eliquis  2.5 mg twice daily because of age and his renal function.  Continue to treat infection.     Kerry Hough  MD  [facility]  08/16/2016, 10:25 AM

## 2016-08-17 DIAGNOSIS — G9340 Encephalopathy, unspecified: Secondary | ICD-10-CM | POA: Diagnosis not present

## 2016-08-17 DIAGNOSIS — F4320 Adjustment disorder, unspecified: Secondary | ICD-10-CM | POA: Diagnosis not present

## 2016-08-17 DIAGNOSIS — A021 Salmonella sepsis: Secondary | ICD-10-CM | POA: Diagnosis not present

## 2016-08-17 DIAGNOSIS — I872 Venous insufficiency (chronic) (peripheral): Secondary | ICD-10-CM | POA: Diagnosis not present

## 2016-08-17 DIAGNOSIS — L03115 Cellulitis of right lower limb: Secondary | ICD-10-CM | POA: Diagnosis not present

## 2016-08-17 DIAGNOSIS — R7881 Bacteremia: Secondary | ICD-10-CM | POA: Diagnosis not present

## 2016-08-17 DIAGNOSIS — I209 Angina pectoris, unspecified: Secondary | ICD-10-CM | POA: Diagnosis not present

## 2016-08-17 DIAGNOSIS — I5022 Chronic systolic (congestive) heart failure: Secondary | ICD-10-CM | POA: Diagnosis not present

## 2016-08-17 DIAGNOSIS — R1310 Dysphagia, unspecified: Secondary | ICD-10-CM | POA: Diagnosis not present

## 2016-08-17 DIAGNOSIS — F331 Major depressive disorder, recurrent, moderate: Secondary | ICD-10-CM | POA: Diagnosis not present

## 2016-08-17 DIAGNOSIS — I4891 Unspecified atrial fibrillation: Secondary | ICD-10-CM | POA: Diagnosis not present

## 2016-08-17 DIAGNOSIS — I110 Hypertensive heart disease with heart failure: Secondary | ICD-10-CM | POA: Diagnosis not present

## 2016-08-17 DIAGNOSIS — I25118 Atherosclerotic heart disease of native coronary artery with other forms of angina pectoris: Secondary | ICD-10-CM | POA: Diagnosis not present

## 2016-08-17 DIAGNOSIS — M6281 Muscle weakness (generalized): Secondary | ICD-10-CM | POA: Diagnosis not present

## 2016-08-17 DIAGNOSIS — E11628 Type 2 diabetes mellitus with other skin complications: Secondary | ICD-10-CM | POA: Diagnosis not present

## 2016-08-17 DIAGNOSIS — N183 Chronic kidney disease, stage 3 (moderate): Secondary | ICD-10-CM | POA: Diagnosis not present

## 2016-08-17 DIAGNOSIS — I129 Hypertensive chronic kidney disease with stage 1 through stage 4 chronic kidney disease, or unspecified chronic kidney disease: Secondary | ICD-10-CM | POA: Diagnosis not present

## 2016-08-17 DIAGNOSIS — E1121 Type 2 diabetes mellitus with diabetic nephropathy: Secondary | ICD-10-CM | POA: Diagnosis not present

## 2016-08-17 DIAGNOSIS — L03119 Cellulitis of unspecified part of limb: Secondary | ICD-10-CM | POA: Diagnosis not present

## 2016-08-17 DIAGNOSIS — I480 Paroxysmal atrial fibrillation: Secondary | ICD-10-CM | POA: Diagnosis not present

## 2016-08-17 DIAGNOSIS — R262 Difficulty in walking, not elsewhere classified: Secondary | ICD-10-CM | POA: Diagnosis not present

## 2016-08-17 DIAGNOSIS — R488 Other symbolic dysfunctions: Secondary | ICD-10-CM | POA: Diagnosis not present

## 2016-08-17 DIAGNOSIS — G3184 Mild cognitive impairment, so stated: Secondary | ICD-10-CM | POA: Diagnosis not present

## 2016-08-17 DIAGNOSIS — R05 Cough: Secondary | ICD-10-CM | POA: Diagnosis not present

## 2016-08-17 DIAGNOSIS — R1312 Dysphagia, oropharyngeal phase: Secondary | ICD-10-CM | POA: Diagnosis not present

## 2016-08-17 DIAGNOSIS — R1013 Epigastric pain: Secondary | ICD-10-CM | POA: Diagnosis not present

## 2016-08-17 DIAGNOSIS — A029 Salmonella infection, unspecified: Secondary | ICD-10-CM | POA: Diagnosis not present

## 2016-08-17 DIAGNOSIS — R6520 Severe sepsis without septic shock: Secondary | ICD-10-CM | POA: Diagnosis not present

## 2016-08-17 DIAGNOSIS — E1122 Type 2 diabetes mellitus with diabetic chronic kidney disease: Secondary | ICD-10-CM | POA: Diagnosis not present

## 2016-08-17 DIAGNOSIS — F0391 Unspecified dementia with behavioral disturbance: Secondary | ICD-10-CM | POA: Diagnosis not present

## 2016-08-17 DIAGNOSIS — F419 Anxiety disorder, unspecified: Secondary | ICD-10-CM | POA: Diagnosis not present

## 2016-08-17 DIAGNOSIS — K449 Diaphragmatic hernia without obstruction or gangrene: Secondary | ICD-10-CM | POA: Diagnosis not present

## 2016-08-17 LAB — CBC
HEMATOCRIT: 44.3 % (ref 39.0–52.0)
HEMOGLOBIN: 15.4 g/dL (ref 13.0–17.0)
MCH: 29.8 pg (ref 26.0–34.0)
MCHC: 34.8 g/dL (ref 30.0–36.0)
MCV: 85.7 fL (ref 78.0–100.0)
Platelets: 88 10*3/uL — ABNORMAL LOW (ref 150–400)
RBC: 5.17 MIL/uL (ref 4.22–5.81)
RDW: 15.2 % (ref 11.5–15.5)
WBC: 6.7 10*3/uL (ref 4.0–10.5)

## 2016-08-17 LAB — COMPREHENSIVE METABOLIC PANEL
ALK PHOS: 44 U/L (ref 38–126)
ALT: 16 U/L — AB (ref 17–63)
AST: 11 U/L — AB (ref 15–41)
Albumin: 2.5 g/dL — ABNORMAL LOW (ref 3.5–5.0)
Anion gap: 8 (ref 5–15)
BILIRUBIN TOTAL: 1.4 mg/dL — AB (ref 0.3–1.2)
BUN: 45 mg/dL — AB (ref 6–20)
CO2: 21 mmol/L — ABNORMAL LOW (ref 22–32)
CREATININE: 1.33 mg/dL — AB (ref 0.61–1.24)
Calcium: 9 mg/dL (ref 8.9–10.3)
Chloride: 108 mmol/L (ref 101–111)
GFR calc Af Amer: 53 mL/min — ABNORMAL LOW (ref >60)
GFR, EST NON AFRICAN AMERICAN: 46 mL/min — AB (ref >60)
Glucose, Bld: 137 mg/dL — ABNORMAL HIGH (ref 65–99)
Potassium: 4.1 mmol/L (ref 3.5–5.1)
Sodium: 137 mmol/L (ref 135–145)
Total Protein: 5.4 g/dL — ABNORMAL LOW (ref 6.5–8.1)

## 2016-08-17 LAB — GLUCOSE, CAPILLARY
GLUCOSE-CAPILLARY: 142 mg/dL — AB (ref 65–99)
GLUCOSE-CAPILLARY: 172 mg/dL — AB (ref 65–99)

## 2016-08-17 MED ORDER — LEVOFLOXACIN 750 MG PO TABS: 750.0000 mg | ORAL_TABLET | ORAL | 0 refills | Status: AC

## 2016-08-17 MED ORDER — DOXYCYCLINE HYCLATE 100 MG PO TABS: 100.0000 mg | ORAL_TABLET | Freq: Two times a day (BID) | ORAL | 0 refills | Status: AC

## 2016-08-17 MED ORDER — BISACODYL 10 MG RE SUPP
10.0000 mg | Freq: Every day | RECTAL | Status: DC | PRN
  Administered 2016-08-17: 10 mg via RECTAL
  Filled 2016-08-17: qty 1

## 2016-08-17 MED ORDER — APIXABAN 2.5 MG PO TABS: 2.5000 mg | ORAL_TABLET | Freq: Two times a day (BID) | ORAL | 0 refills | Status: DC

## 2016-08-17 NOTE — Clinical Social Work Placement (Addendum)
[facility] called for transport.   CLINICAL SOCIAL WORK PLACEMENT  NOTE  Date:  08/17/2016  Patient Details  Name: Aaron Stephenson MRN: [id number] Date of Birth: 21-Jan-1925  Clinical Social Work is seeking post-discharge placement for this patient at the [place] level of care (*[name] will initial, date and re-position this form in  chart as items are completed):  Yes   Patient/family provided with [place] Work Department's list of facilities offering this level of care within the geographic area requested by the patient (or if unable, by the patient's family).  Yes   Patient/family informed of their freedom to choose among providers that offer the needed level of care, that participate in Medicare, Medicaid or managed care program needed by the patient, have an available bed and are willing to accept the patient.  Yes   Patient/family informed of [place]'s ownership interest in [facility] and [facility] as well as of the fact that they are under no obligation to receive care at these facilities.  PASRR submitted to EDS on       PASRR number received on       Existing PASRR number confirmed on 08/17/16     FL2 transmitted to all facilities in geographic area requested by pt/family on       FL2 transmitted to all facilities within larger geographic area on 08/17/16     Patient informed that his/her managed care company has contracts with or will negotiate with certain facilities, including the following:  [facility] and [facility]     Yes   Patient/family informed of bed offers received.  Patient chooses bed at [facility]     Physician recommends and patient chooses bed at      Patient to be transferred to [facility] on 08/17/16.  Patient to be transferred to facility by [facility]      Patient family notified on 08/17/16 of transfer.  Name of family member notified:  Spouse and Daughters      PHYSICIAN Please prepare priority discharge summary, including medications, Please sign DNR     Additional Comment:    _______________________________________________ Lia Hopping, LCSW 08/17/2016, 9:47 AM

## 2016-08-17 NOTE — Progress Notes (Signed)
[name] spoke with patient spouse, she is agreeable for the patient to discharge to [facility] for continued rehab therapy. [name] spoke to patient daughters as well. Patient spouse plans to fill out paperwork.   Kathrin Greathouse, Latanya Presser, MSW Clinical Social Worker 5E and Psychiatric Service Line [phone] 08/17/2016  9:44 AM

## 2016-08-17 NOTE — NC FL2 (Signed)
[place] LEVEL OF CARE SCREENING TOOL     IDENTIFICATION  Patient Name: Aaron Stephenson Birthdate: 03-14-1924 Sex: male Admission Date (Current Location): 08/11/2016  [facility] and [place] Number:  Herbalist and Address:  [facility],  [place], [employer]      Provider Number: [id number]  Attending Physician Name and Address:  Charlynne Cousins, MD  Relative Name and Phone Number:  Serano,[name] Spouse [phone] Carollee Herter Daughter [phone]     Current Level of Care: Hospital Recommended Level of Care: [place] Prior Approval Number:    Date Approved/Denied:   PASRR Number: [id number]  Discharge Plan: SNF    Current Diagnoses: Patient Active Problem List   Diagnosis Date Noted  . Pressure injury of skin 08/16/2016  . Salmonella bacteremia 08/16/2016  . Sepsis ([place]) 08/11/2016  . Cellulitis in diabetic foot ([place]) 08/11/2016  . HCAP (healthcare-associated pneumonia) 08/11/2016  . Bilateral knee effusions 05/01/2016  . Primary osteoarthritis of both knees 05/01/2016  . GERD (gastroesophageal reflux disease) 07/26/2015  . Toxic encephalopathy 07/21/2015  . Kidney disease, chronic, stage III (GFR 30-59 ml/min) 09/21/2014  . Abnormal cardiovascular function study 09/21/2014  . BPH (benign prostatic hypertrophy) 09/21/2014  . Hyperlipidemia   . Chronic venous insufficiency   . NSTEMI (non-ST elevated myocardial infarction) ([place]) 09/19/2014  . Type 2 diabetes mellitus with renal manifestations ([place]) 09/19/2014  . Polycythemia vera ([place]) 02/20/2011    Orientation RESPIRATION BLADDER Height & Weight     Self  Normal Incontinent, External catheter Weight: 144 lb (65.3 kg) Height:  5\' 5"  (165.1 cm)  BEHAVIORAL SYMPTOMS/MOOD NEUROLOGICAL BOWEL NUTRITION STATUS      Continent Diet (Dysphagia 3)  AMBULATORY STATUS COMMUNICATION OF NEEDS Skin   Extensive Assist Verbally PU Stage and  Appropriate Care PU Stage 1 Dressing:  (Sacrum)                     Personal Care Assistance Level of Assistance  Bathing, Feeding, Dressing Bathing Assistance: Limited assistance Feeding assistance: Independent Dressing Assistance: Limited assistance     Functional Limitations Info  Sight, Hearing, Speech Sight Info: Impaired Hearing Info: Adequate Speech Info: Adequate    SPECIAL CARE FACTORS FREQUENCY  Speech therapy     PT Frequency: Min 2X/week              Contractures Contractures Info: Not present    Additional Factors Info  Code Status, Allergies Code Status Info: DNR Allergies Info: Penicillins, Aspirin, Ciprofloxacin, Macrodantin Nitrofurantoin Macrocrystal, Other, Oxycodone, Sulfamethoxazole, Tramadol, Unisom Doxylamine Succinate (Sleep), Xanax Alprazolam           Current Medications (08/17/2016):  This is the current hospital active medication list Current Facility-Administered Medications  Medication Dose Route Frequency Provider Last Rate Last Dose  . acetaminophen (TYLENOL) tablet 650 mg  650 mg Oral Q6H PRN Lily Kocher, MD   650 mg at 08/16/16 2219  . apixaban (ELIQUIS) tablet 2.5 mg  2.5 mg Oral BID Jacolyn Reedy, MD   2.5 mg at 08/16/16 2219  . atorvastatin (LIPITOR) tablet 10 mg  10 mg Oral QHS Lily Kocher, MD   10 mg at 08/16/16 2219  . bisacodyl (DULCOLAX) suppository 10 mg  10 mg Rectal Daily PRN Charlynne Cousins, MD   10 mg at 08/17/16 0753  . docusate sodium (COLACE) capsule 100 mg  100 mg Oral Daily Lily Kocher, MD   100 mg at 08/16/16 0916  .  doxycycline (VIBRA-TABS) tablet 100 mg  100 mg Oral Q12H Thurnell Lose, MD   100 mg at 08/16/16 2219  . fesoterodine (TOVIAZ) tablet 8 mg  8 mg Oral Daily Lily Kocher, MD   8 mg at 08/16/16 0916  . finasteride (PROSCAR) tablet 5 mg  5 mg Oral QHS Lily Kocher, MD   5 mg at 08/16/16 2220  . gadobenate dimeglumine (MULTIHANCE) injection 15 mL  15 mL Intravenous Once PRN Thurnell Lose, MD      . insulin aspart (novoLOG) injection 0-5 Units  0-5 Units Subcutaneous QHS Charlynne Cousins, MD      . insulin aspart (novoLOG) injection 0-9 Units  0-9 Units Subcutaneous TID WC Charlynne Cousins, MD   1 Units at 08/17/16 [phone]  . isosorbide mononitrate (IMDUR) 24 hr tablet 30 mg  30 mg Oral Daily Thurnell Lose, MD   30 mg at 08/16/16 0916  . levofloxacin (LEVAQUIN) tablet 750 mg  750 mg Oral QODAY Charlynne Cousins, MD      . metoprolol tartrate (LOPRESSOR) tablet 25 mg  25 mg Oral BID Thurnell Lose, MD   25 mg at 08/16/16 2219  . mirtazapine (REMERON) tablet 15 mg  15 mg Oral QHS Thurnell Lose, MD   15 mg at 08/16/16 2219  . ondansetron (ZOFRAN) injection 4 mg  4 mg Intravenous Q6H PRN Lily Kocher, MD      . tamsulosin [facility]) capsule 0.4 mg  0.4 mg Oral BID Lily Kocher, MD   0.4 mg at 08/16/16 2219     Discharge Medications: Please see discharge summary for a list of discharge medications.  Relevant Imaging Results:  Relevant Lab Results:   Additional Information SSN: [id number]  Lia Hopping, LCSW

## 2016-08-17 NOTE — Progress Notes (Signed)
Subjective:  Mainly complaints of hard bowel movement and constipation.  No complaints of shortness of breath or chest pain.  Rhythm is irregular but off of telemetry now.  Objective:  Vital Signs in the last 24 hours: BP 122/62 (BP Location: Left Arm)   Pulse 81   Temp 97.7 F (36.5 C) (Oral)   Resp 20   Ht 5\' 5"  (1.651 m)   Wt 65.3 kg (144 lb)   SpO2 96%   BMI 26.34 kg/m   Physical Exam: Pleasant male in no acute distress Lungs:  Clear today Cardiac:  Irregular irregular rhythm, normal S1 and S2, no S3 Extremities: Right foot with some erythema and mild edema on the lateral aspect of foot   Intake/Output from previous day: 07/04 0701 - 07/05 0700 In: 240 [P.O.:240] Out: 325 [Urine:325]  Weight Filed Weights   08/11/16 1830  Weight: 65.3 kg (144 lb)    Lab Results: Basic Metabolic Panel:  Recent Labs  08/16/16 0540 08/17/16 0454  NA 139 137  K 4.0 4.1  CL 110 108  CO2 19* 21*  GLUCOSE 173* 137*  BUN 50* 45*  CREATININE 1.41* 1.33*   CBC:  Recent Labs  08/16/16 0540 08/17/16 0454  WBC 5.9 6.7  HGB 14.6 15.4  HCT 42.3 44.3  MCV 85.8 85.7  PLT 64* 88*   Telemetry: Currently not on telemetry  Assessment/Plan:  1.  Persistent atrial fibrillation 2.  Salmonella bacteremia.  His resolved 3.  Severe hypertensive heart disease with marked LVH 4.  Chronic systolic heart failure with mild reduction in LV function 5.  Coronary artery disease with clinical diagnosis previously 6.  Chronic kidney disease stage III 7.  Possible aspiration pneumonia 8.  Cellulitis of right foot  RECOMMENDATIONS:  At the present time is not symptomatic and rate appears to be recently well controlled.  He will be on eloquent was 2.5 mg twice daily.  His Plavix and aspirin should be stopped.  These were previously given for CAD.     Kerry Hough  MD [facility]  08/17/2016, 9:19 AM

## 2016-08-17 NOTE — Care Management Note (Signed)
Case Management Note  Patient Details  Name: EERO DINI MRN: [id number] Date of Birth: 28-Apr-1924  Subjective/Objective:  Persistent afib, HTN, CHF, cellulitis                  Action/Plan: Discharge Planning: NCM spoke to pt and dtr at bedside. Plan is dc to SNF rehab. [name] following for SNF placement.   PCP  Yaakov Guthrie P    Expected Discharge Date:  08/17/16               Expected Discharge Plan:  Skilled Nursing Facility  In-House Referral:  Clinical Social Work  Discharge planning Services  CM Consult  Post Acute Care Choice:  NA Choice offered to:  NA  DME Arranged:  N/A DME Agency:  NA  HH Arranged:  NA HH Agency:  NA  Status of Service:  Completed, signed off  If discussed at [employer] of Stay Meetings, dates discussed:    Additional Comments:  Erenest Rasher, RN 08/17/2016, 11:30 AM

## 2016-08-17 NOTE — Discharge Summary (Addendum)
Physician Discharge Summary  Aaron Stephenson [id number]:[id number] DOB: 01-08-1925 DOA: 08/11/2016  PCP: Vernie Shanks, MD  Admit date: 08/11/2016 Discharge date: 08/17/2016  Admitted From: Home Disposition:  Home  Recommendations for Outpatient Follow-up:  1. Follow up with PCP in 1-2 weeks 2. Please obtain CBC in one week   Home Health:no Equipment/Devices:NONE  Discharge Condition:stable CODE STATUS:full Diet recommendation: Heart Healthy  Brief/Interim Summary: 83 year old gentleman with past medical history of dementia, polycythemia vera, chronic kidney disease stage III and thrombocytopenia comes into the increased confusion and lethargy, with subjective fevers and chills at home, most of the history was obtained by the caregiver is a patient cannot give a history.  Discharge Diagnoses:  Principal Problem:   Sepsis ([place]) Active Problems:   Type 2 diabetes mellitus with renal manifestations (HCC)   Kidney disease, chronic, stage III (GFR 30-59 ml/min)   Chronic venous insufficiency   Cellulitis in diabetic foot (HCC)   Pressure injury of skin   Salmonella bacteremia  Sepsis due to Salmonella bacteremia: Initially he came in sepsis with thought to be due to his right lower foot cellulitis, blood cultures showed 2 to Salmonella, further concern of GI source GI workup was done which was unremarkable. This was discussed with ID and GI and they recommended no further workup and to complete 14 days course of antibiotics with Levaquin until 08/24/2016.  Essential hypertension: No changes were made to his medication.  Right foot and ankle pain due to cellulitis: MRI of the ankle was done that showed soft tissue infection he will continue Doxy until 08/22/2016.  New onset paroxysmal atrial fibrillation: His primary cardiology was consulted Dr. Wynonia Lawman. [place] vasc 2 score of 4. Who recommended to start Eluquis.  Dysphagia: Continue current diet swelling evaluation done and  08/14/2016, showed no risk of aspiration.  Coronary artery sees: Asymptomatic, continue beta blocker and statins, 2-D echo was done.  Chronic renal disease stage III: Creatinine at baseline.  Diabetes mellitus type 2 with renal manifestation: No changes were made to his medication.  Discharge Instructions  Discharge Instructions    Diet - low sodium heart healthy    Complete by:  As directed    Increase activity slowly    Complete by:  As directed      Allergies as of 08/17/2016      Reactions   Penicillins Hives, Swelling, Anaphylaxis   Has patient had a PCN reaction causing immediate rash, facial/tongue/throat swelling, SOB or lightheadedness with hypotension: unknown Has patient had a PCN reaction causing severe rash involving mucus membranes or skin necrosis: unknown Has patient had a PCN reaction that required hospitalization: unknown Has patient had a PCN reaction occurring within the last 10 years: unknown If all of the above answers are "NO", then may proceed with Cephalosporin use. Pts family is unable to answer questions.   Aspirin Other (See Comments)   Ciprofloxacin Other (See Comments)   Joint swelling   Macrodantin [nitrofurantoin Macrocrystal] Other (See Comments)   Chest pains   Other    "lubriderm pain pill?"   Oxycodone Other (See Comments)   Pt took OxyContin and tramadol together and almost "died"   Sulfamethoxazole Other (See Comments)   Unknown - doesn't remember reaction   Tramadol Other (See Comments)   Blank stare, no expression, becoming addictive   Unisom [doxylamine Succinate (sleep)] Other (See Comments)   Hangover the next day   Xanax [alprazolam] Other (See Comments)   loopy      Medication List  STOP taking these medications   clopidogrel 75 MG tablet Commonly known as:  PLAVIX     TAKE these medications   apixaban 2.5 MG Tabs tablet Commonly known as:  ELIQUIS Take 1 tablet (2.5 mg total) by mouth 2 (two) times daily.    atorvastatin 10 MG tablet Commonly known as:  LIPITOR Take 10 mg by mouth at bedtime.   docusate sodium 100 MG capsule Commonly known as:  COLACE Take 100 mg by mouth daily.   doxycycline 100 MG tablet Commonly known as:  VIBRA-TABS Take 1 tablet (100 mg total) by mouth every 12 (twelve) hours.   finasteride 5 MG tablet Commonly known as:  PROSCAR Take 5 mg by mouth at bedtime.   glipiZIDE 5 MG 24 hr tablet Commonly known as:  GLUCOTROL XL Take 5 mg by mouth daily.   isosorbide mononitrate 60 MG 24 hr tablet Commonly known as:  IMDUR Take 1 tablet (60 mg total) by mouth daily.   levofloxacin 750 MG tablet Commonly known as:  LEVAQUIN Take 1 tablet (750 mg total) by mouth every other day.   lisinopril 5 MG tablet Commonly known as:  PRINIVIL,ZESTRIL Take 5 mg by mouth at bedtime.   metoprolol tartrate 50 MG tablet Commonly known as:  LOPRESSOR Take 25 mg by mouth 2 (two) times daily.   mirtazapine 15 MG tablet Commonly known as:  REMERON Take 15 mg by mouth at bedtime.   nitroGLYCERIN 0.4 MG SL tablet Commonly known as:  NITROSTAT Place 1 tablet (0.4 mg total) under the tongue every 5 (five) minutes x 3 doses as needed for chest pain.   tamsulosin 0.4 MG Caps capsule Commonly known as:  FLOMAX Take 0.4 mg by mouth 2 (two) times daily.   tolterodine 4 MG 24 hr capsule Commonly known as:  DETROL LA Take 4 mg by mouth at bedtime.       Allergies  Allergen Reactions  . Penicillins Hives, Swelling and Anaphylaxis    Has patient had a PCN reaction causing immediate rash, facial/tongue/throat swelling, SOB or lightheadedness with hypotension: unknown Has patient had a PCN reaction causing severe rash involving mucus membranes or skin necrosis: unknown Has patient had a PCN reaction that required hospitalization: unknown Has patient had a PCN reaction occurring within the last 10 years: unknown If all of the above answers are "NO", then may proceed with  Cephalosporin use. Pts family is unable to answer questions.  . Aspirin Other (See Comments)  . Ciprofloxacin Other (See Comments)    Joint swelling  . Macrodantin [Nitrofurantoin Macrocrystal] Other (See Comments)    Chest pains  . Other     "lubriderm pain pill?"  . Oxycodone Other (See Comments)    Pt took OxyContin and tramadol together and almost "died"  . Sulfamethoxazole Other (See Comments)    Unknown - doesn't remember reaction  . Tramadol Other (See Comments)    Blank stare, no expression, becoming addictive  . Unisom [Doxylamine Succinate (Sleep)] Other (See Comments)    Hangover the next day  . Xanax [Alprazolam] Other (See Comments)    loopy    Consultations:  Cardiology   Procedures/Studies: Ct Abdomen Pelvis Wo Contrast  Result Date: 08/13/2016 CLINICAL DATA:  Sepsis, abdominal pain EXAM: CT ABDOMEN AND PELVIS WITHOUT CONTRAST TECHNIQUE: Multidetector CT imaging of the abdomen and pelvis was performed following the standard protocol without IV contrast. COMPARISON:  12/28/2009 FINDINGS: Lower chest: Small bilateral pleural effusions with bibasilar opacities likely compressive atelectasis. Mild cardiomegaly  and small pericardial effusion. Hepatobiliary: Numerous hypodensities throughout the liver are stable since prior study and most compatible with cysts. Mild distention of the gallbladder. No biliary ductal dilatation. Pancreas: No focal abnormality or ductal dilatation. Spleen: No focal abnormality.  Normal size. Adrenals/Urinary Tract: Large bilateral renal cysts. No hydronephrosis. Adrenal glands and urinary bladder are unremarkable. Stomach/Bowel: Appendix is normal. Stomach, large and small bowel grossly unremarkable. Vascular/Lymphatic: Diffuse aortic and iliac calcifications. No aneurysm. Reproductive: Mild prostate enlargement. Other: No free fluid or free air. There is a right inguinal hernia containing distal small bowel loops without obstruction. Fluid also  present within the hernia sac. Musculoskeletal: Prior right hip replacement. Lucency in the right supra-acetabular region is stable since prior study. No acute bony abnormality. IMPRESSION: Small bilateral pleural effusions and pericardial effusion. Bibasilar atelectasis. Cardiomegaly. Hepatic and bilateral renal cysts. Mild distention of the gallbladder. If there is clinical concern for cholecystitis, right upper quadrant ultrasound may be beneficial. Right inguinal hernia containing small bowel loops. No evidence of bowel obstruction. Fluid also present within the right inguinal hernia. Electronically Signed   By: Rolm Baptise M.D.   On: 08/13/2016 10:46   Dg Chest 2 View  Result Date: 08/11/2016 CLINICAL DATA:  Decreased mobility and worsening confusion for 2 days. EXAM: CHEST  2 VIEW COMPARISON:  Chest radiograph Jun 27, 2016 FINDINGS: The cardiac silhouette is moderately enlarged. Mediastinal silhouette is nonsuspicious, calcified aortic knob. Pulmonary vascular congestion. Retrocardiac consolidation with small LEFT pleural effusion. Strandy densities RIGHT lung base. Trachea deviated to the LEFT within the neck, new from prior radiograph which is likely positional. No pneumothorax. Severe bilateral glenohumeral osteoarthrosis. IMPRESSION: Retrocardiac consolidation and small LEFT pleural effusion. Followup PA and lateral chest X-ray is recommended in 3-4 weeks following trial of antibiotic therapy to ensure resolution and exclude underlying malignancy. Stable cardiomegaly and pulmonary vascular congestion. Electronically Signed   By: Elon Alas M.D.   On: 08/11/2016 18:43   Ct Head Wo Contrast  Result Date: 08/11/2016 CLINICAL DATA:  Unable to ambulate.  Confusion. EXAM: CT HEAD WITHOUT CONTRAST TECHNIQUE: Contiguous axial images were obtained from the base of the skull through the vertex without intravenous contrast. COMPARISON:  Jun 27, 2016 FINDINGS: Brain: Evaluation is somewhat limited due  to patient motion. No subdural, epidural, or subarachnoid hemorrhage. Cerebellum, brainstem, and basal cisterns are normal. No mass effect or midline shift. Prominence of sulci and ventricles consistent with volume loss. No acute cortical ischemia or infarct is identified. Vascular: No hyperdense vessel or unexpected calcification. Skull: Normal. Negative for fracture or focal lesion. Sinuses/Orbits: No acute finding. Other: None. IMPRESSION: The study is limited by motion. No acute abnormalities are identified. Electronically Signed   By: Dorise Bullion III M.D   On: 08/11/2016 20:56   Ct Ankle Right Wo Contrast  Result Date: 08/13/2016 CLINICAL DATA:  G negative sepsis and severe right foot pain and swelling. EXAM: CT OF THE RIGHT ANKLE WITHOUT CONTRAST TECHNIQUE: Multidetector CT imaging of the right ankle was performed according to the standard protocol. Multiplanar CT image reconstructions were also generated. COMPARISON:  Radiographs 08/11/2016 FINDINGS: Diffuse and marked subcutaneous soft tissue swelling/edema/ fluid involving the right lower extremity, ankle and foot. No obvious discrete drainable soft tissue abscess is identified. No definite findings for myofasciitis or pyomyositis. No CT evidence of septic arthritis or osteomyelitis. No fractures identified. IMPRESSION: Severe diffuse subcutaneous soft tissue swelling/ edema/fluid without discrete drainable soft tissue abscess, pyomyositis, septic arthritis or osteomyelitis. If symptoms persist or worsen  MRI may be helpful for further evaluation and is much more sensitive for the above-mentioned entities. Electronically Signed   By: Marijo Sanes M.D.   On: 08/13/2016 10:57   Mr Ankle Right Wo Contrast  Result Date: 08/14/2016 CLINICAL DATA:  Fever with right lower extremity pain and swelling. EXAM: MRI OF THE RIGHT ANKLE WITHOUT CONTRAST TECHNIQUE: Multiplanar, multisequence MR imaging of the ankle was performed. No intravenous contrast was  administered. COMPARISON:  CT scan from yesterday FINDINGS: Examination is very limited due to patient motion. Patient would not or could not hold still. I do not see any findings suspicious for osteomyelitis or septic arthritis. There is diffuse and severe cellulitis and diffuse myositis. No obvious discrete drainable soft tissue abscess. The major ligaments and tendons are intact. IMPRESSION: 1. Limited examination due to patient motion. 2. No definite MR findings for discrete drainable soft tissue abscess or pyomyositis. There is diffuse cellulitis and myofasciitis. 3. No findings to suggest septic arthritis or osteomyelitis. Electronically Signed   By: Marijo Sanes M.D.   On: 08/14/2016 10:28   Dg Chest Port 1 View  Result Date: 08/14/2016 CLINICAL DATA:  Fever. EXAM: PORTABLE CHEST 1 VIEW COMPARISON:  08/12/2016 . FINDINGS: Mediastinum and hilar structures are normal. Cardiomegaly with normal pulmonary vascularity. Persistent left lower lobe atelectasis and infiltrate. Small bilateral pleural effusions. No pneumothorax. Degenerative changes thoracic spine. IMPRESSION: 1. Persistent left lower lobe atelectasis and infiltrate. Small bilateral pleural effusions. 2. Stable cardiomegaly. Electronically Signed   By: Marcello Moores  Register   On: 08/14/2016 06:17   Dg Chest Port 1 View  Result Date: 08/12/2016 CLINICAL DATA:  Sudden onset of sneezing this morning. Shortness of breath. EXAM: PORTABLE CHEST 1 VIEW COMPARISON:  08/11/2016 FINDINGS: Chronic cardiomegaly. Chronic aortic atherosclerosis. Left lower lobe volume loss/ infiltrate as shown yesterday. Remainder the chest is clear. No evidence of heart failure. IMPRESSION: Re- demonstration of left lower lobe atelectasis/ pneumonia. Electronically Signed   By: Nelson Chimes M.D.   On: 08/12/2016 10:13   Dg Foot Complete Right  Result Date: 08/11/2016 CLINICAL DATA:  Pain swelling and erythema at the right lower leg. Uncertain chronicity. EXAM: RIGHT FOOT  COMPLETE - 3+ VIEW COMPARISON:  None. FINDINGS: Negative for fracture or other acute bony abnormality. No bony destruction or bone lesion. Marked soft tissue swelling at the dorsum of the foot. No soft tissue gas or soft tissue foreign body. Mild arthritic changes are present, particularly at the first MTP joint and at most of the PIP joints. IMPRESSION: Mild arthritic changes of the forefoot. No acute bony abnormality or bony destruction. Electronically Signed   By: Andreas Newport M.D.   On: 08/11/2016 23:27   Dg Swallowing Func-speech Pathology  Result Date: 08/15/2016 Objective Swallowing Evaluation: Type of Study: MBS-Modified Barium Swallow Study Patient Details Name: Aaron Stephenson MRN: [id number] Date of Birth: 12-22-1924 Today's Date: 08/15/2016 Time: SLP Start Time (ACUTE ONLY): 1135-SLP Stop Time (ACUTE ONLY): 1159 SLP Time Calculation (min) (ACUTE ONLY): 24 min Past Medical History: Past Medical History: Diagnosis Date . BPH (benign prostatic hyperplasia)  . CKD (chronic kidney disease)  . Dementia  . DM (diabetes mellitus), type 2 ([place])  . Enterococcus UTI  . Esophagitis  . GERD (gastroesophageal reflux disease)  . Hiatal hernia  . HTN (hypertension)  . Hyperlipidemia  . Migraine  . Myeloproliferative disease ([place])  . OA (osteoarthritis)  . Physical deconditioning  . Polycythemia vera(238.4) 02/20/2011 . Protein calorie malnutrition ([place])  . Spinal stenosis, lumbar  .  Thrombocytopenia ([place])  . Toxic encephalopathy  . Vertigo  Past Surgical History: Past Surgical History: Procedure Laterality Date . LOWER LEG SOFT TISSUE TUMOR EXCISION   . TOTAL HIP ARTHROPLASTY  1993  right HPI: Pt is an 83 y.o. male with PMH of dementia (still lives at home with his wife), polycythemia vera, CKD 3, HTN, DM, and thrombocytopenia who presents to the ED via EMS for evaluation of increased weakness, confusion, and lethargy, Workup in the ER showed severe sepsis likely due to right foot cellulitis and gram-negative  bacteria also pneumonia was also in consideration. CXR showed LLL atelectasis/ PNA. Pt followed briefly by speech therapy in June 2017 and was noted to be impacted by confusion; recommendation at that time was dysphagia 3/ thin liquids. Bedside swallow eval ordered.  Subjective: pt awake in chair Assessment / Plan / Recommendation CHL IP CLINICAL IMPRESSIONS 08/15/2016 Clinical Impression Overall functional oropharyngeal swallow without aspiration or frank laryngeal penetration of any consistency tested.  He did require extra bolus of thin barium to transit tablet into pharynx.  Fortunately pt did cough during MBS frequently and barium was not visualized in larynx/pharynx.  Pt did become mildly dyspneic during testing with mild wheezing.  Suspect he may episodically aspirate due to discoordination of swallow/respirations - advised to strict precautions.  Will follow up x1 to assure tolerance.  Given his report of problems with foods and his dyspnea, would recommend dys3/thin diet for energy conservation.  Using live video, educated pt/family to findings/recommendations.  Thanks for this order.  SLP Visit Diagnosis Dysphagia, unspecified (R13.10) Attention and concentration deficit following -- Frontal lobe and executive function deficit following -- Impact on safety and function Mild aspiration risk   CHL IP TREATMENT RECOMMENDATION 08/15/2016 Treatment Recommendations Therapy as outlined in treatment plan below   Prognosis 08/15/2016 Prognosis for Safe Diet Advancement Good Barriers to Reach Goals Cognitive deficits Barriers/Prognosis Comment -- CHL IP DIET RECOMMENDATION 08/15/2016 SLP Diet Recommendations Dysphagia 3 (Mech soft) solids;Thin liquid Liquid Administration via Cup;Straw Medication Administration Whole meds with liquid Compensations Slow rate;Small sips/bites Postural Changes Remain semi-upright after after feeds/meals (Comment);Seated upright at 90 degrees   CHL IP OTHER RECOMMENDATIONS 08/15/2016 Recommended  Consults -- Oral Care Recommendations Oral care BID Other Recommendations --   CHL IP FOLLOW UP RECOMMENDATIONS 08/14/2016 Follow up Recommendations (No Data)   CHL IP FREQUENCY AND DURATION 08/15/2016 Speech Therapy Frequency (ACUTE ONLY) min 1 x/week Treatment Duration 1 week      CHL IP ORAL PHASE 08/15/2016 Oral Phase WFL Oral - Pudding Teaspoon -- Oral - Pudding Cup -- Oral - Honey Teaspoon -- Oral - Honey Cup -- Oral - Nectar Teaspoon -- Oral - Nectar Cup WFL Oral - Nectar Straw -- Oral - Thin Teaspoon WFL Oral - Thin Cup WFL Oral - Thin Straw WFL Oral - Puree WFL;Premature spillage Oral - Mech Soft -- Oral - Regular WFL;Premature spillage Oral - Multi-Consistency -- Oral - Pill Delayed oral transit;Decreased bolus cohesion Oral Phase - Comment --  CHL IP PHARYNGEAL PHASE 08/15/2016 Pharyngeal Phase Impaired Pharyngeal- Pudding Teaspoon -- Pharyngeal -- Pharyngeal- Pudding Cup -- Pharyngeal -- Pharyngeal- Honey Teaspoon -- Pharyngeal -- Pharyngeal- Honey Cup -- Pharyngeal -- Pharyngeal- Nectar Teaspoon -- Pharyngeal -- Pharyngeal- Nectar Cup WFL Pharyngeal -- Pharyngeal- Nectar Straw -- Pharyngeal -- Pharyngeal- Thin Teaspoon WFL Pharyngeal -- Pharyngeal- Thin Cup WFL;Penetration/Aspiration during swallow Pharyngeal Material enters airway, remains ABOVE vocal cords then ejected out Pharyngeal- Thin Straw WFL;Penetration/Aspiration during swallow Pharyngeal Material enters airway, remains ABOVE  vocal cords then ejected out Pharyngeal- Puree WFL Pharyngeal -- Pharyngeal- Mechanical Soft -- Pharyngeal -- Pharyngeal- Regular Delayed swallow initiation-vallecula Pharyngeal -- Pharyngeal- Multi-consistency -- Pharyngeal -- Pharyngeal- Pill WFL Pharyngeal -- Pharyngeal Comment --  CHL IP CERVICAL ESOPHAGEAL PHASE 08/15/2016 Cervical Esophageal Phase WFL Pudding Teaspoon -- Pudding Cup -- Honey Teaspoon -- Honey Cup -- Nectar Teaspoon -- Nectar Cup -- Nectar Straw -- Thin Teaspoon -- Thin Cup -- Thin Straw -- Puree --  Mechanical Soft -- Regular -- Multi-consistency -- Pill -- Cervical Esophageal Comment -- CHL IP GO 07/22/2015 Functional Assessment Tool Used NOMS Functional Limitations Swallowing Swallow Current Status (U7654) CJ Swallow Goal Status (Y5035) [place] Swallow Discharge Status (W6568) (None) Motor Speech Current Status (L2751) (None) Motor Speech Goal Status (Z0017) (None) Motor Speech Goal Status (C9449) (None) Spoken Language Comprehension Current Status (Q7591) (None) Spoken Language Comprehension Goal Status (M3846) (None) Spoken Language Comprehension Discharge Status (K5993) (None) Spoken Language Expression Current Status (T7017) (None) Spoken Language Expression Goal Status (B9390) (None) Spoken Language Expression Discharge Status [id number] (None) Attention Current Status (Z3007) (None) Attention Goal Status (M2263) (None) Attention Discharge Status (F3545) (None) Memory Current Status (G2563) (None) Memory Goal Status (S9373) (None) Memory Discharge Status (S2876) (None) Voice Current Status (O1157) (None) Voice Goal Status (W6203) (None) Voice Discharge Status (T5974) (None) Other Speech-Language Pathology Functional Limitation Current Status (B6384) (None) Other Speech-Language Pathology Functional Limitation Goal Status (T3646) (None) Other Speech-Language Pathology Functional Limitation Discharge Status [phone]) (None) Janett Labella Sparkman, MS [facility] SLP [phone]              US Abdomen Limited Ruq  Result Date: 08/14/2016 CLINICAL DATA:  83 year old hypertensive diabetic male with fever. Initial encounter. EXAM: ULTRASOUND ABDOMEN LIMITED RIGHT UPPER QUADRANT COMPARISON:  08/13/2016 CT. FINDINGS: Gallbladder: Gallbladder sludge without discrete gallstone. Gallbladder wall thickening measuring up to 5 mm. Minimal amount of pericholecystic fluid. Per ultrasound technologist, patient was not tender over this region during scanning. Common bile duct: Diameter: 3.8 mm. Liver: Multiple liver cysts  measuring up to 3 cm. 1.7 cm slightly complex cyst along the dome of the liver. No intrahepatic biliary duct dilation. Portal vein is patent. Right-sided pleural effusion. IMPRESSION: Gallbladder wall thickening with minimal amount of pericholecystic fluid. Gallbladder sludge without stone identified. Patient was not tender over this region during scanning per ultrasound technologist. It is possible findings are related to increased right heart pressure or liver dysfunction however, cholecystitis cannot be excluded in proper clinical setting. Multiple liver cysts measuring up to 3 cm. 1.7 cm cyst along the dome liver appears slightly complex. Right-sided pleural effusion. Electronically Signed   By: Genia Del M.D.   On: 08/14/2016 07:31    Subjective: No new complains.  Discharge Exam: Vitals:   08/16/16 2032 08/17/16 0446  BP: (!) 118/54 122/62  Pulse: 85 81  Resp: 20 20  Temp: 98.3 F (36.8 C) 97.7 F (36.5 C)   Vitals:   08/16/16 0921 08/16/16 1441 08/16/16 2032 08/17/16 0446  BP: 113/64 122/68 (!) 118/54 122/62  Pulse:  94 85 81  Resp:  20 20 20   Temp:  97.6 F (36.4 C) 98.3 F (36.8 C) 97.7 F (36.5 C)  TempSrc:  Oral Oral Oral  SpO2:  100% 96% 96%  Weight:      Height:        General: Patient is alert awake and oriented in no acute distress Cardiovascular: Irregular rate and rhythm with positive S1-S2 Respiratory: Good air movement and clear to  auscultation. Abdominal: Positive bowel sounds soft nontender nondistended Extremities: No edema.    The results of significant diagnostics from this hospitalization (including imaging, microbiology, ancillary and laboratory) are listed below for reference.     Microbiology: Recent Results (from the past 240 hour(s))  Culture, blood (Routine x 2)     Status: Abnormal (Preliminary result)   Collection Time: 08/11/16  6:05 PM  Result Value Ref Range Status   Specimen Description BLOOD RIGHT FOREARM  Final   Special  Requests   Final    BOTTLES DRAWN AEROBIC AND ANAEROBIC Blood Culture adequate volume   Culture  Setup Time   Final    GRAM NEGATIVE RODS IN BOTH AEROBIC AND ANAEROBIC BOTTLES CRITICAL RESULT CALLED TO, READ BACK BY AND VERIFIED WITH: C SHADE 08/12/16 @ 22 M VESTAL    Culture (A)  Final    SALMONELLA SPECIES RESULT CALLED TO, READ BACK BY AND VERIFIED WITH: L RUDD,RN AT 1731 08/13/16 BY L [place] NOTIFIED REFERRED TO [place] IN [place] IDENTIFICATION/CONFIRMATION Performed at [facility], [place] [address], [place], [place] [address]    Report Status PENDING  Incomplete   Organism ID, Bacteria SALMONELLA SPECIES  Final      Susceptibility   Salmonella species - MIC*    AMPICILLIN <=2 SENSITIVE Sensitive     LEVOFLOXACIN <=0.12 SENSITIVE Sensitive     TRIMETH/SULFA <=20 SENSITIVE Sensitive     * SALMONELLA SPECIES  Blood Culture ID Panel (Reflexed)     Status: Abnormal   Collection Time: 08/11/16  6:05 PM  Result Value Ref Range Status   Enterococcus species NOT DETECTED NOT DETECTED Final   Listeria monocytogenes NOT DETECTED NOT DETECTED Final   Staphylococcus species NOT DETECTED NOT DETECTED Final   Staphylococcus aureus NOT DETECTED NOT DETECTED Final   Streptococcus species NOT DETECTED NOT DETECTED Final   Streptococcus agalactiae NOT DETECTED NOT DETECTED Final   Streptococcus pneumoniae NOT DETECTED NOT DETECTED Final   Streptococcus pyogenes NOT DETECTED NOT DETECTED Final   Acinetobacter baumannii NOT DETECTED NOT DETECTED Final   Enterobacteriaceae species DETECTED (A) NOT DETECTED Final    Comment: Enterobacteriaceae represent a large family of gram negative bacteria, not a single organism. Refer to culture for further identification. CRITICAL RESULT CALLED TO, READ BACK BY AND VERIFIED WITH: C SHADE 08/12/16 @ 17 M VESTAL    Enterobacter cloacae complex NOT DETECTED NOT DETECTED Final   Escherichia  coli NOT DETECTED NOT DETECTED Final   Klebsiella oxytoca NOT DETECTED NOT DETECTED Final   Klebsiella pneumoniae NOT DETECTED NOT DETECTED Final   Proteus species NOT DETECTED NOT DETECTED Final   Serratia marcescens NOT DETECTED NOT DETECTED Final   Carbapenem resistance NOT DETECTED NOT DETECTED Final   Haemophilus influenzae NOT DETECTED NOT DETECTED Final   Neisseria meningitidis NOT DETECTED NOT DETECTED Final   Pseudomonas aeruginosa NOT DETECTED NOT DETECTED Final   Candida albicans NOT DETECTED NOT DETECTED Final   Candida glabrata NOT DETECTED NOT DETECTED Final   Candida krusei NOT DETECTED NOT DETECTED Final   Candida parapsilosis NOT DETECTED NOT DETECTED Final   Candida tropicalis NOT DETECTED NOT DETECTED Final    Comment: Performed at [facility], [place] [address] [place], [place] [address]  Culture, blood (Routine x 2)     Status: Abnormal   Collection Time: 08/11/16  6:13 PM  Result Value Ref Range Status   Specimen Description RIGHT ANTECUBITAL  Final   Special Requests   Final    BOTTLES DRAWN AEROBIC AND ANAEROBIC Blood Culture adequate volume   Culture  Setup Time   Final    GRAM NEGATIVE RODS IN BOTH AEROBIC AND ANAEROBIC BOTTLES CRITICAL VALUE NOTED.  VALUE IS CONSISTENT WITH PREVIOUSLY REPORTED AND CALLED VALUE.    Culture (A)  Final    SALMONELLA SPECIES RESULT CALLED TO, READ BACK BY AND VERIFIED WITH: L RUDD,RN AT 1731 08/13/16 BY L [place] NOTIFIED REFERRED TO [facility] IN [place], [place] FOR IDENTIFICATION/CONFIRMATION SUSCEPTIBILITIES PERFORMED ON PREVIOUS CULTURE WITHIN THE LAST 5 DAYS. Performed at [facility], [place] [address] [place], [place] [address]    Report Status 08/14/2016 FINAL  Final  MRSA PCR Screening     Status: None   Collection Time: 08/12/16  3:30 AM  Result Value Ref Range Status   MRSA by PCR NEGATIVE NEGATIVE Final    Comment:        The GeneXpert MRSA Assay  (FDA approved for NASAL specimens only), is one component of a comprehensive MRSA colonization surveillance program. It is not intended to diagnose MRSA infection nor to guide or monitor treatment for MRSA infections.      Labs: BNP (last 3 results) No results for input(s): BNP in the last 8760 hours. Basic Metabolic Panel:  Recent Labs Lab 08/13/16 0340 08/14/16 0327 08/15/16 0324 08/16/16 0540 08/17/16 0454  NA 135 135 135 139 137  K 4.3 4.4 3.9 4.0 4.1  CL 109 110 110 110 108  CO2 20* 18* 20* 19* 21*  GLUCOSE 78 109* 148* 173* 137*  BUN 41* 43* 47* 50* 45*  CREATININE 1.46* 1.44* 1.36* 1.41* 1.33*  CALCIUM 8.1* 8.0* 8.4* 8.8* 9.0   Liver Function Tests:  Recent Labs Lab 08/11/16 1809 08/14/16 0930 08/15/16 0324 08/16/16 0540 08/17/16 0454  AST 34 28 15 13* 11*  ALT 24 26 22 17  16*  ALKPHOS 47 45 41 51 44  BILITOT 2.2* 0.9 1.0 0.8 1.4*  PROT 7.0 5.0* 5.2* 5.2* 5.4*  ALBUMIN 3.7 2.2* 2.2* 2.2* 2.5*   No results for input(s): LIPASE, AMYLASE in the last 168 hours. No results for input(s): AMMONIA in the last 168 hours. CBC:  Recent Labs Lab 08/11/16 1809  08/13/16 0340 08/14/16 0327 08/15/16 0324 08/16/16 0540 08/17/16 0454  WBC 7.0  < > 8.5 10.2 6.8 5.9 6.7  NEUTROABS 6.3  --   --   --   --   --   --   HGB 16.6  < > 14.3 14.4 14.1 14.6 15.4  HCT 48.2  < > 41.7 41.5 40.5 42.3 44.3  MCV 87.2  < > 86.0 85.9 85.4 85.8 85.7  PLT 81*  < > 60* 63* 61* 64* 88*  < > = values in this interval not displayed. Cardiac Enzymes:  Recent Labs Lab 08/12/16 0745  CKTOTAL 230   BNP: Invalid input(s): POCBNP CBG:  Recent Labs Lab 08/16/16 0718 08/16/16 1214 08/16/16 1717 08/16/16 2217 08/17/16 0737  GLUCAP 154* 164* 124* 109* 142*   D-Dimer No results for input(s): DDIMER in the last 72 hours. Hgb A1c No results for input(s): HGBA1C in the last 72 hours. Lipid Profile No results for input(s): CHOL, HDL, LDLCALC, TRIG, CHOLHDL, LDLDIRECT in the  last 72 hours. Thyroid function studies  Recent Labs  08/14/16 1038  TSH 2.248   Anemia work up No results for input(s): VITAMINB12, FOLATE, FERRITIN, TIBC, IRON,  RETICCTPCT in the last 72 hours. Urinalysis    Component Value Date/Time   COLORURINE YELLOW 08/12/2016 0428   APPEARANCEUR HAZY (A) 08/12/2016 0428   LABSPEC 1.013 08/12/2016 0428   PHURINE 5.0 08/12/2016 0428   GLUCOSEU NEGATIVE 08/12/2016 0428   HGBUR SMALL (A) 08/12/2016 0428   BILIRUBINUR NEGATIVE 08/12/2016 0428   KETONESUR NEGATIVE 08/12/2016 0428   PROTEINUR NEGATIVE 08/12/2016 0428   UROBILINOGEN 1.0 09/19/2014 1610   NITRITE NEGATIVE 08/12/2016 0428   LEUKOCYTESUR NEGATIVE 08/12/2016 0428   Sepsis Labs Invalid input(s): PROCALCITONIN,  WBC,  LACTICIDVEN Microbiology Recent Results (from the past 240 hour(s))  Culture, blood (Routine x 2)     Status: Abnormal (Preliminary result)   Collection Time: 08/11/16  6:05 PM  Result Value Ref Range Status   Specimen Description BLOOD RIGHT FOREARM  Final   Special Requests   Final    BOTTLES DRAWN AEROBIC AND ANAEROBIC Blood Culture adequate volume   Culture  Setup Time   Final    GRAM NEGATIVE RODS IN BOTH AEROBIC AND ANAEROBIC BOTTLES CRITICAL RESULT CALLED TO, READ BACK BY AND VERIFIED WITH: C SHADE 08/12/16 @ 63 M VESTAL    Culture (A)  Final    SALMONELLA SPECIES RESULT CALLED TO, READ BACK BY AND VERIFIED WITH: L RUDD,RN AT 1731 08/13/16 BY L [place] NOTIFIED REFERRED TO [facility] IN [place] IDENTIFICATION/CONFIRMATION Performed at [facility], [place] [address], [place], [place] [address]    Report Status PENDING  Incomplete   Organism ID, Bacteria SALMONELLA SPECIES  Final      Susceptibility   Salmonella species - MIC*    AMPICILLIN <=2 SENSITIVE Sensitive     LEVOFLOXACIN <=0.12 SENSITIVE Sensitive     TRIMETH/SULFA <=20 SENSITIVE Sensitive     * SALMONELLA SPECIES  Blood  Culture ID Panel (Reflexed)     Status: Abnormal   Collection Time: 08/11/16  6:05 PM  Result Value Ref Range Status   Enterococcus species NOT DETECTED NOT DETECTED Final   Listeria monocytogenes NOT DETECTED NOT DETECTED Final   Staphylococcus species NOT DETECTED NOT DETECTED Final   Staphylococcus aureus NOT DETECTED NOT DETECTED Final   Streptococcus species NOT DETECTED NOT DETECTED Final   Streptococcus agalactiae NOT DETECTED NOT DETECTED Final   Streptococcus pneumoniae NOT DETECTED NOT DETECTED Final   Streptococcus pyogenes NOT DETECTED NOT DETECTED Final   Acinetobacter baumannii NOT DETECTED NOT DETECTED Final   Enterobacteriaceae species DETECTED (A) NOT DETECTED Final    Comment: Enterobacteriaceae represent a large family of gram negative bacteria, not a single organism. Refer to culture for further identification. CRITICAL RESULT CALLED TO, READ BACK BY AND VERIFIED WITH: C SHADE 08/12/16 @ 47 M VESTAL    Enterobacter cloacae complex NOT DETECTED NOT DETECTED Final   Escherichia coli NOT DETECTED NOT DETECTED Final   Klebsiella oxytoca NOT DETECTED NOT DETECTED Final   Klebsiella pneumoniae NOT DETECTED NOT DETECTED Final   Proteus species NOT DETECTED NOT DETECTED Final   Serratia marcescens NOT DETECTED NOT DETECTED Final   Carbapenem resistance NOT DETECTED NOT DETECTED Final   Haemophilus influenzae NOT DETECTED NOT DETECTED Final   Neisseria meningitidis NOT DETECTED NOT DETECTED Final   Pseudomonas aeruginosa NOT DETECTED NOT DETECTED Final   Candida albicans NOT DETECTED NOT DETECTED Final   Candida glabrata NOT DETECTED NOT DETECTED Final   Candida krusei NOT DETECTED NOT DETECTED Final   Candida parapsilosis NOT DETECTED NOT DETECTED  Final   Candida tropicalis NOT DETECTED NOT DETECTED Final    Comment: Performed at [facility], [place] [address] [place], [place] [address]  Culture, blood (Routine x 2)     Status: Abnormal   Collection Time: 08/11/16   6:13 PM  Result Value Ref Range Status   Specimen Description RIGHT ANTECUBITAL  Final   Special Requests   Final    BOTTLES DRAWN AEROBIC AND ANAEROBIC Blood Culture adequate volume   Culture  Setup Time   Final    GRAM NEGATIVE RODS IN BOTH AEROBIC AND ANAEROBIC BOTTLES CRITICAL VALUE NOTED.  VALUE IS CONSISTENT WITH PREVIOUSLY REPORTED AND CALLED VALUE.    Culture (A)  Final    SALMONELLA SPECIES RESULT CALLED TO, READ BACK BY AND VERIFIED WITH: L RUDD,RN AT 1731 08/13/16 BY L [place] NOTIFIED REFERRED TO [facility] IN [place], [place] FOR IDENTIFICATION/CONFIRMATION SUSCEPTIBILITIES PERFORMED ON PREVIOUS CULTURE WITHIN THE LAST 5 DAYS. Performed at [facility], [place] [address], [place], [place] [address]    Report Status 08/14/2016 FINAL  Final  MRSA PCR Screening     Status: None   Collection Time: 08/12/16  3:30 AM  Result Value Ref Range Status   MRSA by PCR NEGATIVE NEGATIVE Final    Comment:        The GeneXpert MRSA Assay (FDA approved for NASAL specimens only), is one component of a comprehensive MRSA colonization surveillance program. It is not intended to diagnose MRSA infection nor to guide or monitor treatment for MRSA infections.      Time coordinating discharge: Over 30 minutes  SIGNED:   Charlynne Cousins, MD  [facility] 08/17/2016, 7:49 AM Pager   If 7PM-7AM, please contact night-coverage [email or web address] Password [email or web address]

## 2016-08-17 NOTE — Care Management Important Message (Signed)
Important Message  Patient Details  Name: Aaron Stephenson MRN: [id number] Date of Birth: 07-13-1924   Medicare Important Message Given:  Yes    Erenest Rasher, RN 08/17/2016, 11:29 AM

## 2016-08-17 NOTE — Progress Notes (Signed)
Report called to [employer]. Pt transported via [facility].

## 2016-08-18 ENCOUNTER — Non-Acute Institutional Stay (SKILLED_NURSING_FACILITY): Payer: Medicare Other | Admitting: Internal Medicine

## 2016-08-18 ENCOUNTER — Encounter: Payer: Self-pay | Admitting: Internal Medicine

## 2016-08-18 DIAGNOSIS — I209 Angina pectoris, unspecified: Secondary | ICD-10-CM | POA: Diagnosis not present

## 2016-08-18 DIAGNOSIS — L03119 Cellulitis of unspecified part of limb: Secondary | ICD-10-CM

## 2016-08-18 DIAGNOSIS — R7881 Bacteremia: Secondary | ICD-10-CM | POA: Diagnosis not present

## 2016-08-18 DIAGNOSIS — A021 Salmonella sepsis: Secondary | ICD-10-CM | POA: Diagnosis not present

## 2016-08-18 DIAGNOSIS — I110 Hypertensive heart disease with heart failure: Secondary | ICD-10-CM

## 2016-08-18 DIAGNOSIS — I5022 Chronic systolic (congestive) heart failure: Secondary | ICD-10-CM

## 2016-08-18 DIAGNOSIS — I25118 Atherosclerotic heart disease of native coronary artery with other forms of angina pectoris: Secondary | ICD-10-CM

## 2016-08-18 DIAGNOSIS — E1121 Type 2 diabetes mellitus with diabetic nephropathy: Secondary | ICD-10-CM | POA: Diagnosis not present

## 2016-08-18 DIAGNOSIS — E11628 Type 2 diabetes mellitus with other skin complications: Secondary | ICD-10-CM

## 2016-08-18 DIAGNOSIS — N183 Chronic kidney disease, stage 3 unspecified: Secondary | ICD-10-CM

## 2016-08-18 DIAGNOSIS — I4891 Unspecified atrial fibrillation: Secondary | ICD-10-CM

## 2016-08-18 NOTE — Progress Notes (Signed)
: Provider:  Noah Delaine. Sheppard Coil, MD Location:  [facility] Room Number: 112 Place of Service:  SNF ([phone])  PCP: Vernie Shanks, MD Patient Care Team: Vernie Shanks, MD as PCP - General (Family Medicine)  Extended Emergency Contact Information Primary Emergency Contact: Fluke,[name] Address: [address]          [place] Phone: [phone] Relation: Spouse Secondary Emergency Contact: Javier Glazier [place] Phone: [phone] Relation: Daughter     Allergies: Penicillins; Aspirin; Ciprofloxacin; Macrodantin [nitrofurantoin macrocrystal]; Other; Oxycodone; Sulfamethoxazole; Tramadol; Unisom [doxylamine succinate (sleep)]; and Xanax [alprazolam]  Chief Complaint  Patient presents with  . New Admit To SNF    following hospitalization 08/11/16 to 08/17/16 sepsis    HPI: Patient is 83 y.o. male with Dementia, polycythemia vera, CPK D3, hypertension, diabetes mellitus, and thrombocytopenia who presented to the ED via EMS for evaluation of increased weakness confusion and lethargy. He has had subjective fevers and chills at home. One caregiver has been sick family not sure who it is. No chest pain or shortness of breath no cough or sputum production no lightheadedness no tick bites he has new erythema to his right foot and leg; family notes he saw the podiatrist to have his toenails clipped earlier this week. In ED patient's temperature was 103, heart rate 105, respiratory rate in the 20s, lactic acid 3.36 chest x-ray showed possible retrocardiac consolidation and he does appear to have a right lower extremity cellulitis. Patient was admitted to [facility] from 6/27-7/5 where he was treated for sepsis due to Salmonella bacteremia and right foot and ankle cellulitis, initially with vancomycin, Levaquin and aztreonam. pared down to of Levaquin. Hospital course was complicated by new onset  paroxysmal atrial fibrillation. Cardiology was consulted, Dr. Wynonia Lawman, who recommended Eliquis. Patient is already on a beta blocker. Patient is admitted to skilled nursing facility with generalized weakness for OT/PT. While at skilled nursing facility patient will be followed for hypertension treated with  lisinopril and metoprolol, coronary artery disease treated with metoprolol and statin and isosorbide and diabetes mellitus type 2 treated with glipizide.  Past Medical History:  Diagnosis Date  . BPH (benign prostatic hyperplasia)   . Cellulitis in diabetic foot ([place]) 08/11/2016  . Chronic venous insufficiency   . CKD (chronic kidney disease)   . Dementia   . DM (diabetes mellitus), type 2 ([place])   . Enterococcus UTI   . Esophagitis   . GERD (gastroesophageal reflux disease)   . Hiatal hernia   . HTN (hypertension)   . Hyperlipidemia   . Kidney disease, chronic, stage III (GFR 30-59 ml/min) 09/21/2014  . Migraine   . Myeloproliferative disease ([place])   . NSTEMI (non-ST elevated myocardial infarction) ([place]) 09/19/2014  . OA (osteoarthritis)   . Physical deconditioning   . Polycythemia vera(238.4) 02/20/2011  . Protein calorie malnutrition ([place])   . Sepsis ([place]) 08/11/2016  . Spinal stenosis, lumbar   . Thrombocytopenia ([place])   . Toxic encephalopathy   . Type 2 diabetes mellitus with renal manifestations ([place]) 09/19/2014  . Vertigo     Past Surgical History:  Procedure Laterality Date  . LOWER LEG SOFT TISSUE TUMOR EXCISION    . TOTAL HIP ARTHROPLASTY  1993   right    Allergies as of 08/18/2016      Reactions   Penicillins Hives, Swelling, Anaphylaxis   Has patient had a PCN reaction causing immediate  rash, facial/tongue/throat swelling, SOB or lightheadedness with hypotension: unknown Has patient had a PCN reaction causing severe rash involving mucus membranes or skin necrosis: unknown Has patient had a PCN reaction that required hospitalization: unknown Has patient had a PCN  reaction occurring within the last 10 years: unknown If all of the above answers are "NO", then may proceed with Cephalosporin use. Pts family is unable to answer questions.   Aspirin Other (See Comments)   Ciprofloxacin Other (See Comments)   Joint swelling   Macrodantin [nitrofurantoin Macrocrystal] Other (See Comments)   Chest pains   Other    "lubriderm pain pill?"   Oxycodone Other (See Comments)   Pt took OxyContin and tramadol together and almost "died"   Sulfamethoxazole Other (See Comments)   Unknown - doesn't remember reaction   Tramadol Other (See Comments)   Blank stare, no expression, becoming addictive   Unisom [doxylamine Succinate (sleep)] Other (See Comments)   Hangover the next day   Xanax [alprazolam] Other (See Comments)   loopy      Medication List       Accurate as of 08/18/16  9:24 AM. Always use your most recent med list.          apixaban 2.5 MG Tabs tablet Commonly known as:  ELIQUIS Take 1 tablet (2.5 mg total) by mouth 2 (two) times daily.   atorvastatin 10 MG tablet Commonly known as:  LIPITOR Take 10 mg by mouth at bedtime.   docusate sodium 100 MG capsule Commonly known as:  COLACE Take 100 mg by mouth daily.   doxycycline 100 MG tablet Commonly known as:  VIBRA-TABS Take 1 tablet (100 mg total) by mouth every 12 (twelve) hours.   finasteride 5 MG tablet Commonly known as:  PROSCAR Take 5 mg by mouth at bedtime.   glipiZIDE 5 MG 24 hr tablet Commonly known as:  GLUCOTROL XL Take 5 mg by mouth daily.   isosorbide mononitrate 60 MG 24 hr tablet Commonly known as:  IMDUR Take 1 tablet (60 mg total) by mouth daily.   levofloxacin 750 MG tablet Commonly known as:  LEVAQUIN Take 1 tablet (750 mg total) by mouth every other day.   lisinopril 5 MG tablet Commonly known as:  PRINIVIL,ZESTRIL Take 5 mg by mouth at bedtime.   metoprolol tartrate 50 MG tablet Commonly known as:  LOPRESSOR Take 25 mg by mouth 2 (two) times daily.     mirtazapine 15 MG tablet Commonly known as:  REMERON Take 15 mg by mouth at bedtime.   nitroGLYCERIN 0.4 MG SL tablet Commonly known as:  NITROSTAT Place 1 tablet (0.4 mg total) under the tongue every 5 (five) minutes x 3 doses as needed for chest pain.   tamsulosin 0.4 MG Caps capsule Commonly known as:  FLOMAX Take 0.4 mg by mouth 2 (two) times daily.   tolterodine 4 MG 24 hr capsule Commonly known as:  DETROL LA Take 4 mg by mouth at bedtime.       No orders of the defined types were placed in this encounter.   Immunization History  Administered Date(s) Administered  . PPD Test 08/17/2016  . Td 05/07/2014    Social History  Substance Use Topics  . Smoking status: Never Smoker  . Smokeless tobacco: Never Used  . Alcohol use Yes     Comment: rarely    Family history is   Family History  Problem Relation Age of Onset  . Diabetes Mother   . Hypertension  Mother   . Cancer Father   . Diabetes Brother   . Kidney failure Brother       Review of Systems  DATA OBTAINED: from patient, nurse GENERAL:  no fevers, fatigue, appetite changes SKIN: R foot is reedder than before; also more painful EYES: No eye pain, redness, discharge EARS: No earache, tinnitus, change in hearing NOSE: No congestion, drainage or bleeding  MOUTH/THROAT: No mouth or tooth pain, No sore throat RESPIRATORY: No cough, wheezing, SOB CARDIAC: No chest pain, palpitations, lower extremity edema  GI: No abdominal pain, No N/V/D or constipation, No heartburn or reflux  GU: No dysuria, frequency or urgency, or incontinence  MUSCULOSKELETAL: No unrelieved bone/joint pain NEUROLOGIC: No headache, dizziness or focal weakness PSYCHIATRIC: No c/o anxiety or sadness   Vitals:   08/18/16 0911  BP: 128/71  Pulse: 88  Resp: 20  Temp: 97.6 F (36.4 C)    SpO2 Readings from Last 1 Encounters:  08/17/16 96%   Body mass index is 23.96 kg/m.     Physical Exam  GENERAL APPEARANCE: Alert,  conversant,  No acute distress.  SKIN: R foot with extreme redness, heat and TTP medially; the redness has not gone outside the line that was drawn on his foot to outline the original line of redness HEAD: Normocephalic, atraumatic  EYES: Conjunctiva/lids clear. Pupils round, reactive. EOMs intact.  EARS: External exam WNL, canals clear. Hearing grossly normal.  NOSE: No deformity or discharge.  MOUTH/THROAT: Lips w/o lesions  RESPIRATORY: Breathing is even, unlabored. Lung sounds are clear   CARDIOVASCULAR: Heart RRR no murmurs, rubs or gallops. 1+ peripheral edema.   GASTROINTESTINAL: Abdomen is soft, non-tender, not distended w/ normal bowel sounds. GENITOURINARY: Bladder non tender, not distended  MUSCULOSKELETAL: No abnormal joints or musculature NEUROLOGIC:  Cranial nerves 2-12 grossly intact. Moves all extremities  PSYCHIATRIC: Mood and affect appropriate to situation, no behavioral issues  Patient Active Problem List   Diagnosis Date Noted  . Pressure injury of skin 08/16/2016  . Salmonella bacteremia 08/16/2016  . Sepsis ([place]) 08/11/2016  . Cellulitis in diabetic foot ([place]) 08/11/2016  . HCAP (healthcare-associated pneumonia) 08/11/2016  . Bilateral knee effusions 05/01/2016  . Primary osteoarthritis of both knees 05/01/2016  . GERD (gastroesophageal reflux disease) 07/26/2015  . Toxic encephalopathy 07/21/2015  . Kidney disease, chronic, stage III (GFR 30-59 ml/min) 09/21/2014  . Abnormal cardiovascular function study 09/21/2014  . BPH (benign prostatic hypertrophy) 09/21/2014  . Hyperlipidemia   . Chronic venous insufficiency   . NSTEMI (non-ST elevated myocardial infarction) ([place]) 09/19/2014  . Type 2 diabetes mellitus with renal manifestations ([place]) 09/19/2014  . Polycythemia vera ([place]) 02/20/2011      Labs reviewed: Basic Metabolic Panel:    Component Value Date/Time   NA 137 08/17/2016 0454   NA 141 11/19/2013 0946   K 4.1 08/17/2016 0454   K 4.4 11/19/2013  0946   CL 108 08/17/2016 0454   CL 108 (H) 05/21/2012 1040   CO2 21 (L) 08/17/2016 0454   CO2 26 11/19/2013 0946   GLUCOSE 137 (H) 08/17/2016 0454   GLUCOSE 145 (H) 11/19/2013 0946   GLUCOSE 164 (H) 05/21/2012 1040   BUN 45 (H) 08/17/2016 0454   BUN 34.0 (H) 11/19/2013 0946   CREATININE 1.33 (H) 08/17/2016 0454   CREATININE 1.9 (H) 11/19/2013 0946   CALCIUM 9.0 08/17/2016 0454   CALCIUM 9.5 11/19/2013 0946   PROT 5.4 (L) 08/17/2016 0454   PROT 6.7 05/28/2013 1458   ALBUMIN 2.5 (L) 08/17/2016  0454   ALBUMIN 3.6 05/28/2013 1458   AST 11 (L) 08/17/2016 0454   AST 23 05/28/2013 1458   ALT 16 (L) 08/17/2016 0454   ALT 19 05/28/2013 1458   ALKPHOS 44 08/17/2016 0454   ALKPHOS 53 05/28/2013 1458   BILITOT 1.4 (H) 08/17/2016 0454   BILITOT 0.47 05/28/2013 1458   GFRNONAA 46 (L) 08/17/2016 0454   GFRAA 53 (L) 08/17/2016 0454     Recent Labs  08/15/16 0324 08/16/16 0540 08/17/16 0454  NA 135 139 137  K 3.9 4.0 4.1  CL 110 110 108  CO2 20* 19* 21*  GLUCOSE 148* 173* 137*  BUN 47* 50* 45*  CREATININE 1.36* 1.41* 1.33*  CALCIUM 8.4* 8.8* 9.0   Liver Function Tests:  Recent Labs  08/15/16 0324 08/16/16 0540 08/17/16 0454  AST 15 13* 11*  ALT 22 17 16*  ALKPHOS 41 51 44  BILITOT 1.0 0.8 1.4*  PROT 5.2* 5.2* 5.4*  ALBUMIN 2.2* 2.2* 2.5*   No results for input(s): LIPASE, AMYLASE in the last 8760 hours. No results for input(s): AMMONIA in the last 8760 hours. CBC:  Recent Labs  06/27/16 1911 08/11/16 1809  08/15/16 0324 08/16/16 0540 08/17/16 0454  WBC 5.1 7.0  < > 6.8 5.9 6.7  NEUTROABS 3.3 6.3  --   --   --   --   HGB 16.0 16.6  < > 14.1 14.6 15.4  HCT 47.3 48.2  < > 40.5 42.3 44.3  MCV 87.6 87.2  < > 85.4 85.8 85.7  PLT 123* 81*  < > 61* 64* 88*  < > = values in this interval not displayed. Lipid No results for input(s): CHOL, HDL, LDLCALC, TRIG in the last 8760 hours.  Cardiac Enzymes:  Recent Labs  08/12/16 0745  CKTOTAL 230   BNP: No  results for input(s): BNP in the last 8760 hours. No results found for: [facility] Lab Results  Component Value Date   HGBA1C 7.4 (H) 09/19/2014   Lab Results  Component Value Date   TSH 2.248 08/14/2016   No results found for: VITAMINB12 No results found for: FOLATE Lab Results  Component Value Date   FERRITIN 23 05/21/2012    Imaging and Procedures obtained prior to SNF admission: Dg Chest 2 View  Result Date: 08/11/2016 CLINICAL DATA:  Decreased mobility and worsening confusion for 2 days. EXAM: CHEST  2 VIEW COMPARISON:  Chest radiograph Jun 27, 2016 FINDINGS: The cardiac silhouette is moderately enlarged. Mediastinal silhouette is nonsuspicious, calcified aortic knob. Pulmonary vascular congestion. Retrocardiac consolidation with small LEFT pleural effusion. Strandy densities RIGHT lung base. Trachea deviated to the LEFT within the neck, new from prior radiograph which is likely positional. No pneumothorax. Severe bilateral glenohumeral osteoarthrosis. IMPRESSION: Retrocardiac consolidation and small LEFT pleural effusion. Followup PA and lateral chest X-ray is recommended in 3-4 weeks following trial of antibiotic therapy to ensure resolution and exclude underlying malignancy. Stable cardiomegaly and pulmonary vascular congestion. Electronically Signed   By: Elon Alas M.D.   On: 08/11/2016 18:43   Ct Head Wo Contrast  Result Date: 08/11/2016 CLINICAL DATA:  Unable to ambulate.  Confusion. EXAM: CT HEAD WITHOUT CONTRAST TECHNIQUE: Contiguous axial images were obtained from the base of the skull through the vertex without intravenous contrast. COMPARISON:  Jun 27, 2016 FINDINGS: Brain: Evaluation is somewhat limited due to patient motion. No subdural, epidural, or subarachnoid hemorrhage. Cerebellum, brainstem, and basal cisterns are normal. No mass effect or midline shift. Prominence of sulci  and ventricles consistent with volume loss. No acute cortical ischemia or infarct is  identified. Vascular: No hyperdense vessel or unexpected calcification. Skull: Normal. Negative for fracture or focal lesion. Sinuses/Orbits: No acute finding. Other: None. IMPRESSION: The study is limited by motion. No acute abnormalities are identified. Electronically Signed   By: Dorise Bullion III M.D   On: 08/11/2016 20:56   Dg Chest Port 1 View  Result Date: 08/12/2016 CLINICAL DATA:  Sudden onset of sneezing this morning. Shortness of breath. EXAM: PORTABLE CHEST 1 VIEW COMPARISON:  08/11/2016 FINDINGS: Chronic cardiomegaly. Chronic aortic atherosclerosis. Left lower lobe volume loss/ infiltrate as shown yesterday. Remainder the chest is clear. No evidence of heart failure. IMPRESSION: Re- demonstration of left lower lobe atelectasis/ pneumonia. Electronically Signed   By: Nelson Chimes M.D.   On: 08/12/2016 10:13   Dg Foot Complete Right  Result Date: 08/11/2016 CLINICAL DATA:  Pain swelling and erythema at the right lower leg. Uncertain chronicity. EXAM: RIGHT FOOT COMPLETE - 3+ VIEW COMPARISON:  None. FINDINGS: Negative for fracture or other acute bony abnormality. No bony destruction or bone lesion. Marked soft tissue swelling at the dorsum of the foot. No soft tissue gas or soft tissue foreign body. Mild arthritic changes are present, particularly at the first MTP joint and at most of the PIP joints. IMPRESSION: Mild arthritic changes of the forefoot. No acute bony abnormality or bony destruction. Electronically Signed   By: Andreas Newport M.D.   On: 08/11/2016 23:27     Not all labs, radiology exams or other studies done during hospitalization come through on my EPIC note; however they are reviewed by me.    Assessment and Plan  SEPSIS SECONDARY TO SALMONELLA BACTEREMIA-initially thought to be due to his right lower foot cellulitis; however blood cultures showed 2 positive for salmonella, further concern of GI source, GI workup was done which was unremarkable. ID and GI both  recommended no further workup and to complete 14 day course of Levaquin until 08/24/2016 SNF -admitted for generalized weakness for OT/PT; will continue Levaquin 750 mg daily until 08/24/2016  RIGHT FOOT AND ANKLE CELLULITIS-MRI of the ankle was negative for osteomyelitis; patient was started on doxycycline 100 mg twice a day until 08/22/2016 SNF -patient cellulitis does not appear to be responding to doxycycline; therefore we'll DC doxycycline and start clindamycin 3 mg by mouth every 6 for 10 days   [place] FIBRILLATION-Dr. Wynonia Lawman, cardiology was consult.; [place] vasc score of 4; Eliquis was recommended, patient already on metoprolol SNF-continue Eliquis 2.5 mg twice a day and metoprolol 25 mg by mouth twice a day  HYPERTENSION SNF -controlled; continue lisinopril 5 mg daily at bedtime and metoprolol 25 mg by mouth twice a day  CORONARY ARTERY DISEASE/CHRONIC SYSTOLIC CHF-echo shows severe LVH with mild to moderate systolic dysfunction, estimated ejection fraction 40-45%; diffuse hypokinesis SNF - stable, no chest pain; plan to continue Imdur, 24 hour tablets 50 mg by mouth daily metoprolol 25 mg twice a day, Lipitor 10 mg daily, lisinopril 5 mg daily at bedtime and nitroglycerin 0.4 mg sublingual every 5 minutes when necessary  CK D3 SNF -creatinine at baseline 1.3-1.4; will follow up BMP  DIABETES TYPE II WITH RENAL MANIFESTATION- SNF -looks like blood sugars ran in the 150s range while in the hospital plan to continue glipizide 5 mg 24-hour tablet daily     Time spent > 45 min;> 50% of time with patient was spent reviewing records, labs, tests and studies, counseling  and developing plan of care  Noah Delaine. Sheppard Coil, MD

## 2016-08-19 ENCOUNTER — Encounter: Payer: Self-pay | Admitting: Internal Medicine

## 2016-08-19 DIAGNOSIS — I5022 Chronic systolic (congestive) heart failure: Secondary | ICD-10-CM | POA: Insufficient documentation

## 2016-08-19 DIAGNOSIS — I2510 Atherosclerotic heart disease of native coronary artery without angina pectoris: Secondary | ICD-10-CM | POA: Insufficient documentation

## 2016-08-19 DIAGNOSIS — I110 Hypertensive heart disease with heart failure: Secondary | ICD-10-CM | POA: Insufficient documentation

## 2016-08-19 DIAGNOSIS — I4891 Unspecified atrial fibrillation: Secondary | ICD-10-CM | POA: Insufficient documentation

## 2016-08-22 LAB — BASIC METABOLIC PANEL
BUN: 30 — AB (ref 4–21)
Creatinine: 1.3 (ref 0.6–1.3)
Glucose: 121
POTASSIUM: 4.9 (ref 3.4–5.3)
Sodium: 142 (ref 137–147)

## 2016-08-22 LAB — CBC AND DIFFERENTIAL
HEMATOCRIT: 47 (ref 41–53)
HEMOGLOBIN: 15.1 (ref 13.5–17.5)
PLATELETS: 148 — AB (ref 150–399)
WBC: 12.4

## 2016-08-23 ENCOUNTER — Other Ambulatory Visit: Payer: Self-pay

## 2016-08-25 ENCOUNTER — Other Ambulatory Visit: Payer: Self-pay | Admitting: Gastroenterology

## 2016-08-25 ENCOUNTER — Other Ambulatory Visit (HOSPITAL_COMMUNITY): Payer: Self-pay | Admitting: Gastroenterology

## 2016-08-25 DIAGNOSIS — R1310 Dysphagia, unspecified: Secondary | ICD-10-CM | POA: Diagnosis not present

## 2016-08-25 DIAGNOSIS — F0391 Unspecified dementia with behavioral disturbance: Secondary | ICD-10-CM | POA: Diagnosis not present

## 2016-08-25 DIAGNOSIS — R1013 Epigastric pain: Secondary | ICD-10-CM | POA: Diagnosis not present

## 2016-09-01 ENCOUNTER — Ambulatory Visit (HOSPITAL_COMMUNITY)
Admission: RE | Admit: 2016-09-01 | Discharge: 2016-09-01 | Disposition: A | Discharge status: Home / Self Care | Payer: PRIVATE HEALTH INSURANCE | Source: Ambulatory Visit | Attending: Gastroenterology | Admitting: Gastroenterology

## 2016-09-01 DIAGNOSIS — R05 Cough: Secondary | ICD-10-CM | POA: Diagnosis not present

## 2016-09-01 DIAGNOSIS — R1310 Dysphagia, unspecified: Secondary | ICD-10-CM | POA: Diagnosis not present

## 2016-09-01 DIAGNOSIS — K449 Diaphragmatic hernia without obstruction or gangrene: Secondary | ICD-10-CM | POA: Insufficient documentation

## 2016-09-04 ENCOUNTER — Encounter: Payer: Self-pay | Admitting: Internal Medicine

## 2016-09-04 ENCOUNTER — Non-Acute Institutional Stay (SKILLED_NURSING_FACILITY): Payer: Medicare Other | Admitting: Internal Medicine

## 2016-09-04 DIAGNOSIS — R7881 Bacteremia: Secondary | ICD-10-CM

## 2016-09-04 DIAGNOSIS — N183 Chronic kidney disease, stage 3 unspecified: Secondary | ICD-10-CM

## 2016-09-04 DIAGNOSIS — L0390 Cellulitis, unspecified: Secondary | ICD-10-CM | POA: Insufficient documentation

## 2016-09-04 DIAGNOSIS — I110 Hypertensive heart disease with heart failure: Secondary | ICD-10-CM

## 2016-09-04 DIAGNOSIS — L03115 Cellulitis of right lower limb: Secondary | ICD-10-CM

## 2016-09-04 DIAGNOSIS — I25118 Atherosclerotic heart disease of native coronary artery with other forms of angina pectoris: Secondary | ICD-10-CM | POA: Diagnosis not present

## 2016-09-04 DIAGNOSIS — I5022 Chronic systolic (congestive) heart failure: Secondary | ICD-10-CM | POA: Diagnosis not present

## 2016-09-04 DIAGNOSIS — I4891 Unspecified atrial fibrillation: Secondary | ICD-10-CM | POA: Diagnosis not present

## 2016-09-04 DIAGNOSIS — E1121 Type 2 diabetes mellitus with diabetic nephropathy: Secondary | ICD-10-CM | POA: Diagnosis not present

## 2016-09-04 DIAGNOSIS — L03119 Cellulitis of unspecified part of limb: Secondary | ICD-10-CM | POA: Diagnosis not present

## 2016-09-04 DIAGNOSIS — A021 Salmonella sepsis: Secondary | ICD-10-CM

## 2016-09-04 DIAGNOSIS — E11628 Type 2 diabetes mellitus with other skin complications: Secondary | ICD-10-CM | POA: Diagnosis not present

## 2016-09-04 NOTE — Progress Notes (Addendum)
Location:  [place] Room Number: [place]:  SNF [phone])  Provider: Noah Stephenson. Aaron Coil, MD  PCP: Aaron Shanks, MD Patient Care Team: Aaron Shanks, MD as PCP - General [facility])  Extended Emergency Contact Information Primary Emergency Contact: [name],[name] Address: [name], [name] Phone: [phone] Relation: Spouse Secondary Emergency Contact: Aaron Stephenson [place] Phone: [phone] Relation: Daughter  Allergies  Allergen Reactions  . Penicillins Hives, Swelling and Anaphylaxis    Has patient had a PCN reaction causing immediate rash, facial/tongue/throat swelling, SOB or lightheadedness with hypotension: unknown Has patient had a PCN reaction causing severe rash involving mucus membranes or skin necrosis: unknown Has patient had a PCN reaction that required hospitalization: unknown Has patient had a PCN reaction occurring within the last 10 years: unknown If all of the above answers are "NO", then may proceed with Cephalosporin use. Pts family is unable to answer questions.  . Aspirin Other (See Comments)  . Ciprofloxacin Other (See Comments)    Joint swelling  . Macrodantin [Nitrofurantoin Macrocrystal] Other (See Comments)    Chest pains  . Other     "lubriderm pain pill?"  . Oxycodone Other (See Comments)    Pt took OxyContin and tramadol together and almost "died"  . Sulfamethoxazole Other (See Comments)    Unknown - doesn't remember reaction  . Tramadol Other (See Comments)    Blank stare, no expression, becoming addictive  . Unisom [Doxylamine Succinate (Sleep)] Other (See Comments)    Hangover the next day  . Xanax [Alprazolam] Other (See Comments)    loopy    Chief Complaint  Patient presents with  . Discharge Note    discharge from SNF to [name]    HPI:  83 y.o. male  with Dementia, polycythemia vera,  CPK D3, hypertension, diabetes mellitus, and thrombocytopenia who presented to the ED via EMS for evaluation of increased weakness confusion and lethargy. He has had subjective fevers and chills at home. One caregiver has been sick family not sure who it is. No chest pain or shortness of breath no cough or sputum production no lightheadedness no tick bites he has new erythema to his right foot and leg; family notes he saw the podiatrist to have his toenails clipped earlier this week. In ED patient's temperature was 103, heart rate 105, respiratory rate in the 20s, lactic acid 3.36 chest x-ray showed possible retrocardiac consolidation and he does appear to have a right lower extremity cellulitis. Patient was admitted to [facility] from 6/27-7/5 where he was treated for sepsis due to Salmonella bacteremia and right foot and ankle cellulitis, initially with vancomycin, Levaquin and aztreonam. pared down to of Levaquin. Hospital course was complicated by new onset paroxysmal atrial fibrillation. Cardiology was consulted, Dr. Wynonia Stephenson, who recommended Eliquis. Patient is already on a beta blocker. Patient is admitted to skilled nursing facility with generalized weakness for OT/PT.Pt is now ready to be d/c to ALF, memory care unit..    Past Medical History:  Diagnosis Date  . BPH (benign prostatic hyperplasia)   . BPH (benign prostatic hypertrophy) 09/21/2014  . Cellulitis in diabetic foot ([place]) 08/11/2016  . Chronic venous insufficiency   . CKD (chronic kidney disease)   . Dementia   . DM (diabetes mellitus), type 2 ([place])   . Enterococcus UTI   . Esophagitis   .  GERD (gastroesophageal reflux disease)   . Hiatal hernia   . HTN (hypertension)   . Hyperlipidemia   . Kidney disease, chronic, stage III (GFR 30-59 ml/min) 09/21/2014  . Migraine   . Myeloproliferative disease ([place])   . NSTEMI (non-ST elevated myocardial infarction) ([place]) 09/19/2014  . OA (osteoarthritis)   . Physical deconditioning   .  Polycythemia vera ([place]) 02/20/2011   JAK 2 mutation +   . Polycythemia vera(238.4) 02/20/2011  . Protein calorie malnutrition ([place])   . Sepsis ([place]) 08/11/2016  . Spinal stenosis, lumbar   . Thrombocytopenia ([place])   . Toxic encephalopathy   . Type 2 diabetes mellitus with renal manifestations ([place]) 09/19/2014  . Vertigo     Past Surgical History:  Procedure Laterality Date  . LOWER LEG SOFT TISSUE TUMOR EXCISION    . TOTAL HIP ARTHROPLASTY  1993   right     reports that he has never smoked. He has never used smokeless tobacco. He reports that he drinks alcohol. He reports that he does not use drugs. Social History   Social History  . Marital status: Married    Spouse name: N/A  . Number of children: N/A  . Years of education: N/A   Occupational History  . retired Press photographer    Social History Main Topics  . Smoking status: Never Smoker  . Smokeless tobacco: Never Used  . Alcohol use Yes     Comment: rarely  . Drug use: No  . Sexual activity: Not on file   Other Topics Concern  . Not on file   Social History Narrative   Admitted to [facility] 08/17/16    Married -Karena Addison   Never smoked   Alcohol -rarely   DNR       Pertinent  Health Maintenance Due  Topic Date Due  . FOOT EXAM  10/04/1934  . OPHTHALMOLOGY EXAM  10/04/1934  . PNA vac Low Risk Adult (1 of 2 - PCV13) 10/05/1991  . HEMOGLOBIN A1C  03/22/2015  . INFLUENZA VACCINE  09/13/2016    Medications: Allergies as of 09/04/2016      Reactions   Penicillins Hives, Swelling, Anaphylaxis   Has patient had a PCN reaction causing immediate rash, facial/tongue/throat swelling, SOB or lightheadedness with hypotension: unknown Has patient had a PCN reaction causing severe rash involving mucus membranes or skin necrosis: unknown Has patient had a PCN reaction that required hospitalization: unknown Has patient had a PCN reaction occurring within the last 10 years: unknown If all of the above answers are "NO",  then may proceed with Cephalosporin use. Pts family is unable to answer questions.   Aspirin Other (See Comments)   Ciprofloxacin Other (See Comments)   Joint swelling   Macrodantin [nitrofurantoin Macrocrystal] Other (See Comments)   Chest pains   Other    "lubriderm pain pill?"   Oxycodone Other (See Comments)   Pt took OxyContin and tramadol together and almost "died"   Sulfamethoxazole Other (See Comments)   Unknown - doesn't remember reaction   Tramadol Other (See Comments)   Blank stare, no expression, becoming addictive   Unisom [doxylamine Succinate (sleep)] Other (See Comments)   Hangover the next day   Xanax [alprazolam] Other (See Comments)   loopy      Medication List       Accurate as of 09/04/16  2:26 PM. Always use your most recent med list.          acetaminophen 650 MG CR tablet  Commonly known as:  TYLENOL Take 650 mg by mouth. Take one tablet every 4 hours as needed for pain   apixaban 2.5 MG Tabs tablet Commonly known as:  ELIQUIS Take 1 tablet (2.5 mg total) by mouth 2 (two) times daily.   atorvastatin 10 MG tablet Commonly known as:  LIPITOR Take 10 mg by mouth at bedtime.   docusate sodium 100 MG capsule Commonly known as:  COLACE Take 100 mg by mouth daily.   finasteride 5 MG tablet Commonly known as:  PROSCAR Take 5 mg by mouth at bedtime.   glipiZIDE 5 MG 24 hr tablet Commonly known as:  GLUCOTROL XL Take 5 mg by mouth daily.   isosorbide mononitrate 60 MG 24 hr tablet Commonly known as:  IMDUR Take 1 tablet (60 mg total) by mouth daily.   lisinopril 5 MG tablet Commonly known as:  PRINIVIL,ZESTRIL Take 5 mg by mouth at bedtime.   metoprolol tartrate 50 MG tablet Commonly known as:  LOPRESSOR Take 25 mg by mouth 2 (two) times daily.   mirtazapine 15 MG tablet Commonly known as:  REMERON Take 15 mg by mouth at bedtime.   nitroGLYCERIN 0.4 MG SL tablet Commonly known as:  NITROSTAT Place 1 tablet (0.4 mg total) under the  tongue every 5 (five) minutes x 3 doses as needed for chest pain.   NON FORMULARY Med Pass 2.0 120 mls twice daily with medication   tamsulosin 0.4 MG Caps capsule Commonly known as:  FLOMAX Take 0.4 mg by mouth 2 (two) times daily.   tolterodine 4 MG 24 hr capsule Commonly known as:  DETROL LA Take 4 mg by mouth at bedtime.        Vitals:   09/04/16 1250  BP: 102/61  Pulse: 66  Resp: 18  Temp: 97.9 F (36.6 C)  SpO2: 92%  Weight: 165 lb 6.4 oz (75 kg)  Height: 5\' 5"  (1.651 m)   Body mass index is 27.52 kg/m.  Physical Exam  GENERAL APPEARANCE: Alert, conversant. No acute distress.  HEENT: Unremarkable. RESPIRATORY: Breathing is even, unlabored. Lung sounds are clear   CARDIOVASCULAR: Heart RRR no murmurs, rubs or gallops. No peripheral edema.  GASTROINTESTINAL: Abdomen is soft, non-tender, not distended w/ normal bowel sounds.  NEUROLOGIC: Cranial nerves 2-12 grossly intact. Moves all extremities   Labs reviewed: Basic Metabolic Panel:  Recent Labs  08/15/16 0324 08/16/16 0540 08/17/16 0454 08/22/16  NA 135 139 137 142  K 3.9 4.0 4.1 4.9  CL 110 110 108  --   CO2 20* 19* 21*  --   GLUCOSE 148* 173* 137*  --   BUN 47* 50* 45* 30*  CREATININE 1.36* 1.41* 1.33* 1.3  CALCIUM 8.4* 8.8* 9.0  --    No results found for: [facility] Liver Function Tests:  Recent Labs  08/15/16 0324 08/16/16 0540 08/17/16 0454  AST 15 13* 11*  ALT 22 17 16*  ALKPHOS 41 51 44  BILITOT 1.0 0.8 1.4*  PROT 5.2* 5.2* 5.4*  ALBUMIN 2.2* 2.2* 2.5*   No results for input(s): LIPASE, AMYLASE in the last 8760 hours. No results for input(s): AMMONIA in the last 8760 hours. CBC:  Recent Labs  06/27/16 1911 08/11/16 1809  08/15/16 0324 08/16/16 0540 08/17/16 0454 08/22/16  WBC 5.1 7.0  < > 6.8 5.9 6.7 12.4  NEUTROABS 3.3 6.3  --   --   --   --   --   HGB 16.0 16.6  < > 14.1 14.6  15.4 15.1  HCT 47.3 48.2  < > 40.5 42.3 44.3 47  MCV 87.6 87.2  < > 85.4 85.8 85.7  --     PLT 123* 81*  < > 61* 64* 88* 148*  < > = values in this interval not displayed. Lipid No results for input(s): CHOL, HDL, LDLCALC, TRIG in the last 8760 hours. Cardiac Enzymes:  Recent Labs  08/12/16 0745  CKTOTAL 230   BNP: No results for input(s): BNP in the last 8760 hours. CBG:  Recent Labs  08/16/16 2217 08/17/16 0737 08/17/16 1127  GLUCAP 109* 142* 172*    Procedures and Imaging Studies During Stay: Ct Abdomen Pelvis Wo Contrast  Result Date: 08/13/2016 CLINICAL DATA:  Sepsis, abdominal pain EXAM: CT ABDOMEN AND PELVIS WITHOUT CONTRAST TECHNIQUE: Multidetector CT imaging of the abdomen and pelvis was performed following the standard protocol without IV contrast. COMPARISON:  12/28/2009 FINDINGS: Lower chest: Small bilateral pleural effusions with bibasilar opacities likely compressive atelectasis. Mild cardiomegaly and small pericardial effusion. Hepatobiliary: Numerous hypodensities throughout the liver are stable since prior study and most compatible with cysts. Mild distention of the gallbladder. No biliary ductal dilatation. Pancreas: No focal abnormality or ductal dilatation. Spleen: No focal abnormality.  Normal size. Adrenals/Urinary Tract: Large bilateral renal cysts. No hydronephrosis. Adrenal glands and urinary bladder are unremarkable. Stomach/Bowel: Appendix is normal. Stomach, large and small bowel grossly unremarkable. Vascular/Lymphatic: Diffuse aortic and iliac calcifications. No aneurysm. Reproductive: Mild prostate enlargement. Other: No free fluid or free air. There is a right inguinal hernia containing distal small bowel loops without obstruction. Fluid also present within the hernia sac. Musculoskeletal: Prior right hip replacement. Lucency in the right supra-acetabular region is stable since prior study. No acute bony abnormality. IMPRESSION: Small bilateral pleural effusions and pericardial effusion. Bibasilar atelectasis. Cardiomegaly. Hepatic and bilateral  renal cysts. Mild distention of the gallbladder. If there is clinical concern for cholecystitis, right upper quadrant ultrasound may be beneficial. Right inguinal hernia containing small bowel loops. No evidence of bowel obstruction. Fluid also present within the right inguinal hernia. Electronically Signed   By: Rolm Baptise M.D.   On: 08/13/2016 10:46   Dg Chest 2 View  Result Date: 08/11/2016 CLINICAL DATA:  Decreased mobility and worsening confusion for 2 days. EXAM: CHEST  2 VIEW COMPARISON:  Chest radiograph Jun 27, 2016 FINDINGS: The cardiac silhouette is moderately enlarged. Mediastinal silhouette is nonsuspicious, calcified aortic knob. Pulmonary vascular congestion. Retrocardiac consolidation with small LEFT pleural effusion. Strandy densities RIGHT lung base. Trachea deviated to the LEFT within the neck, new from prior radiograph which is likely positional. No pneumothorax. Severe bilateral glenohumeral osteoarthrosis. IMPRESSION: Retrocardiac consolidation and small LEFT pleural effusion. Followup PA and lateral chest X-ray is recommended in 3-4 weeks following trial of antibiotic therapy to ensure resolution and exclude underlying malignancy. Stable cardiomegaly and pulmonary vascular congestion. Electronically Signed   By: Elon Alas M.D.   On: 08/11/2016 18:43   Ct Head Wo Contrast  Result Date: 08/11/2016 CLINICAL DATA:  Unable to ambulate.  Confusion. EXAM: CT HEAD WITHOUT CONTRAST TECHNIQUE: Contiguous axial images were obtained from the base of the skull through the vertex without intravenous contrast. COMPARISON:  Jun 27, 2016 FINDINGS: Brain: Evaluation is somewhat limited due to patient motion. No subdural, epidural, or subarachnoid hemorrhage. Cerebellum, brainstem, and basal cisterns are normal. No mass effect or midline shift. Prominence of sulci and ventricles consistent with volume loss. No acute cortical ischemia or infarct is identified. Vascular: No hyperdense vessel or  unexpected calcification. Skull: Normal. Negative for fracture or focal lesion. Sinuses/Orbits: No acute finding. Other: None. IMPRESSION: The study is limited by motion. No acute abnormalities are identified. Electronically Signed   By: Dorise Bullion III M.D   On: 08/11/2016 20:56   Dg Esophagus  Result Date: 09/01/2016 CLINICAL DATA:  Recurrent cough. EXAM: ESOPHOGRAM/BARIUM SWALLOW TECHNIQUE: Single contrast examination was performed using  thin barium. FLUOROSCOPY TIME:  Fluoroscopy Time:  1 minutes 18 seconds Radiation Exposure Index (if provided by the fluoroscopic device): 7.9 mGy Number of Acquired Spot Images: 0 COMPARISON:  Modified barium swallow 08/15/2016 FINDINGS: Patient was unable to safely stand and esophagus was studied LPO semi recumbent with single contrast technique. Pharyngeal function was recently assessed on dedicated study. There is a smooth mild to moderate narrowing above a small hiatal hernia, findings of peptic stricture. There is normal distention of the remainder of the esophagus. No detected mucosal irregularity. A 13 mm barium tablet was slow to pass through the narrowing. Reflux was not seen during the study and there was no significant stasis in this patient with cough. No notable dysmotility or presbyesophagus. IMPRESSION: 1. Smooth, presumed peptic stricture above a small sliding hiatal hernia. The narrowing is mild to moderate; a 13 mm barium tablet was slow to pass through the narrowing. 2. No noted stasis or visualized reflux in this patient with chronic cough. Electronically Signed   By: Monte Fantasia M.D.   On: 09/01/2016 10:19   Ct Ankle Right Wo Contrast  Result Date: 08/13/2016 CLINICAL DATA:  G negative sepsis and severe right foot pain and swelling. EXAM: CT OF THE RIGHT ANKLE WITHOUT CONTRAST TECHNIQUE: Multidetector CT imaging of the right ankle was performed according to the standard protocol. Multiplanar CT image reconstructions were also generated.  COMPARISON:  Radiographs 08/11/2016 FINDINGS: Diffuse and marked subcutaneous soft tissue swelling/edema/ fluid involving the right lower extremity, ankle and foot. No obvious discrete drainable soft tissue abscess is identified. No definite findings for myofasciitis or pyomyositis. No CT evidence of septic arthritis or osteomyelitis. No fractures identified. IMPRESSION: Severe diffuse subcutaneous soft tissue swelling/ edema/fluid without discrete drainable soft tissue abscess, pyomyositis, septic arthritis or osteomyelitis. If symptoms persist or worsen MRI may be helpful for further evaluation and is much more sensitive for the above-mentioned entities. Electronically Signed   By: Marijo Sanes M.D.   On: 08/13/2016 10:57   Mr Ankle Right Wo Contrast  Result Date: 08/14/2016 CLINICAL DATA:  Fever with right lower extremity pain and swelling. EXAM: MRI OF THE RIGHT ANKLE WITHOUT CONTRAST TECHNIQUE: Multiplanar, multisequence MR imaging of the ankle was performed. No intravenous contrast was administered. COMPARISON:  CT scan from yesterday FINDINGS: Examination is very limited due to patient motion. Patient would not or could not hold still. I do not see any findings suspicious for osteomyelitis or septic arthritis. There is diffuse and severe cellulitis and diffuse myositis. No obvious discrete drainable soft tissue abscess. The major ligaments and tendons are intact. IMPRESSION: 1. Limited examination due to patient motion. 2. No definite MR findings for discrete drainable soft tissue abscess or pyomyositis. There is diffuse cellulitis and myofasciitis. 3. No findings to suggest septic arthritis or osteomyelitis. Electronically Signed   By: Marijo Sanes M.D.   On: 08/14/2016 10:28   Dg Chest Port 1 View  Result Date: 08/14/2016 CLINICAL DATA:  Fever. EXAM: PORTABLE CHEST 1 VIEW COMPARISON:  08/12/2016 . FINDINGS: Mediastinum and hilar structures are normal. Cardiomegaly with normal pulmonary vascularity.  Persistent left  lower lobe atelectasis and infiltrate. Small bilateral pleural effusions. No pneumothorax. Degenerative changes thoracic spine. IMPRESSION: 1. Persistent left lower lobe atelectasis and infiltrate. Small bilateral pleural effusions. 2. Stable cardiomegaly. Electronically Signed   By: Marcello Moores  Register   On: 08/14/2016 06:17   Dg Chest Port 1 View  Result Date: 08/12/2016 CLINICAL DATA:  Sudden onset of sneezing this morning. Shortness of breath. EXAM: PORTABLE CHEST 1 VIEW COMPARISON:  08/11/2016 FINDINGS: Chronic cardiomegaly. Chronic aortic atherosclerosis. Left lower lobe volume loss/ infiltrate as shown yesterday. Remainder the chest is clear. No evidence of heart failure. IMPRESSION: Re- demonstration of left lower lobe atelectasis/ pneumonia. Electronically Signed   By: Nelson Chimes M.D.   On: 08/12/2016 10:13   Dg Foot Complete Right  Result Date: 08/11/2016 CLINICAL DATA:  Pain swelling and erythema at the right lower leg. Uncertain chronicity. EXAM: RIGHT FOOT COMPLETE - 3+ VIEW COMPARISON:  None. FINDINGS: Negative for fracture or other acute bony abnormality. No bony destruction or bone lesion. Marked soft tissue swelling at the dorsum of the foot. No soft tissue gas or soft tissue foreign body. Mild arthritic changes are present, particularly at the first MTP joint and at most of the PIP joints. IMPRESSION: Mild arthritic changes of the forefoot. No acute bony abnormality or bony destruction. Electronically Signed   By: Andreas Newport M.D.   On: 08/11/2016 23:27   Dg Swallowing Func-speech Pathology  Result Date: 08/15/2016 Objective Swallowing Evaluation: Type of Study: MBS-Modified Barium Swallow Study Patient Details Name: Aaron Stephenson MRN: [id number] Date of Birth: October 08, 1924 Today's Date: 08/15/2016 Time: SLP Start Time (ACUTE ONLY): 1135-SLP Stop Time (ACUTE ONLY): 1159 SLP Time Calculation (min) (ACUTE ONLY): 24 min Past Medical History: Past Medical History:  Diagnosis Date . BPH (benign prostatic hyperplasia)  . CKD (chronic kidney disease)  . Dementia  . DM (diabetes mellitus), type 2 ([place])  . Enterococcus UTI  . Esophagitis  . GERD (gastroesophageal reflux disease)  . Hiatal hernia  . HTN (hypertension)  . Hyperlipidemia  . Migraine  . Myeloproliferative disease ([place])  . OA (osteoarthritis)  . Physical deconditioning  . Polycythemia vera(238.4) 02/20/2011 . Protein calorie malnutrition ([place])  . Spinal stenosis, lumbar  . Thrombocytopenia ([place])  . Toxic encephalopathy  . Vertigo  Past Surgical History: Past Surgical History: Procedure Laterality Date . LOWER LEG SOFT TISSUE TUMOR EXCISION   . TOTAL HIP ARTHROPLASTY  1993  right HPI: Pt is an 83 y.o. male with PMH of dementia (still lives at home with his wife), polycythemia vera, CKD 3, HTN, DM, and thrombocytopenia who presents to the ED via EMS for evaluation of increased weakness, confusion, and lethargy, Workup in the ER showed severe sepsis likely due to right foot cellulitis and gram-negative bacteria also pneumonia was also in consideration. CXR showed LLL atelectasis/ PNA. Pt followed briefly by speech therapy in June 2017 and was noted to be impacted by confusion; recommendation at that time was dysphagia 3/ thin liquids. Bedside swallow eval ordered.  Subjective: pt awake in chair Assessment / Plan / Recommendation CHL IP CLINICAL IMPRESSIONS 08/15/2016 Clinical Impression Overall functional oropharyngeal swallow without aspiration or frank laryngeal penetration of any consistency tested.  He did require extra bolus of thin barium to transit tablet into pharynx.  Fortunately pt did cough during MBS frequently and barium was not visualized in larynx/pharynx.  Pt did become mildly dyspneic during testing with mild wheezing.  Suspect he may episodically aspirate due to discoordination of swallow/respirations - advised to  strict precautions.  Will follow up x1 to assure tolerance.  Given his report of problems with  foods and his dyspnea, would recommend dys3/thin diet for energy conservation.  Using live video, educated pt/family to findings/recommendations.  Thanks for this order.  SLP Visit Diagnosis Dysphagia, unspecified (R13.10) Attention and concentration deficit following -- Frontal lobe and executive function deficit following -- Impact on safety and function Mild aspiration risk   CHL IP TREATMENT RECOMMENDATION 08/15/2016 Treatment Recommendations Therapy as outlined in treatment plan below   Prognosis 08/15/2016 Prognosis for Safe Diet Advancement Good Barriers to Reach Goals Cognitive deficits Barriers/Prognosis Comment -- CHL IP DIET RECOMMENDATION 08/15/2016 SLP Diet Recommendations Dysphagia 3 (Mech soft) solids;Thin liquid Liquid Administration via Cup;Straw Medication Administration Whole meds with liquid Compensations Slow rate;Small sips/bites Postural Changes Remain semi-upright after after feeds/meals (Comment);Seated upright at 90 degrees   CHL IP OTHER RECOMMENDATIONS 08/15/2016 Recommended Consults -- Oral Care Recommendations Oral care BID Other Recommendations --   CHL IP FOLLOW UP RECOMMENDATIONS 08/14/2016 Follow up Recommendations (No Data)   CHL IP FREQUENCY AND DURATION 08/15/2016 Speech Therapy Frequency (ACUTE ONLY) min 1 x/week Treatment Duration 1 week      CHL IP ORAL PHASE 08/15/2016 Oral Phase WFL Oral - Pudding Teaspoon -- Oral - Pudding Cup -- Oral - Honey Teaspoon -- Oral - Honey Cup -- Oral - Nectar Teaspoon -- Oral - Nectar Cup WFL Oral - Nectar Straw -- Oral - Thin Teaspoon WFL Oral - Thin Cup WFL Oral - Thin Straw WFL Oral - Puree WFL;Premature spillage Oral - Mech Soft -- Oral - Regular WFL;Premature spillage Oral - Multi-Consistency -- Oral - Pill Delayed oral transit;Decreased bolus cohesion Oral Phase - Comment --  CHL IP PHARYNGEAL PHASE 08/15/2016 Pharyngeal Phase Impaired Pharyngeal- Pudding Teaspoon -- Pharyngeal -- Pharyngeal- Pudding Cup -- Pharyngeal -- Pharyngeal- Honey Teaspoon --  Pharyngeal -- Pharyngeal- Honey Cup -- Pharyngeal -- Pharyngeal- Nectar Teaspoon -- Pharyngeal -- Pharyngeal- Nectar Cup WFL Pharyngeal -- Pharyngeal- Nectar Straw -- Pharyngeal -- Pharyngeal- Thin Teaspoon WFL Pharyngeal -- Pharyngeal- Thin Cup WFL;Penetration/Aspiration during swallow Pharyngeal Material enters airway, remains ABOVE vocal cords then ejected out Pharyngeal- Thin Straw WFL;Penetration/Aspiration during swallow Pharyngeal Material enters airway, remains ABOVE vocal cords then ejected out Pharyngeal- Puree WFL Pharyngeal -- Pharyngeal- Mechanical Soft -- Pharyngeal -- Pharyngeal- Regular Delayed swallow initiation-vallecula Pharyngeal -- Pharyngeal- Multi-consistency -- Pharyngeal -- Pharyngeal- Pill WFL Pharyngeal -- Pharyngeal Comment --  CHL IP CERVICAL ESOPHAGEAL PHASE 08/15/2016 Cervical Esophageal Phase WFL Pudding Teaspoon -- Pudding Cup -- Honey Teaspoon -- Honey Cup -- Nectar Teaspoon -- Nectar Cup -- Nectar Straw -- Thin Teaspoon -- Thin Cup -- Thin Straw -- Puree -- Mechanical Soft -- Regular -- Multi-consistency -- Pill -- Cervical Esophageal Comment -- CHL IP GO 07/22/2015 Functional Assessment Tool Used NOMS Functional Limitations Swallowing Swallow Current Status (W2585) CJ Swallow Goal Status (I7782) Bernice Swallow Discharge Status (U2353) (None) Motor Speech Current Status (I1443) (None) Motor Speech Goal Status (X5400) (None) Motor Speech Goal Status (Q6761) (None) Spoken Language Comprehension Current Status (P5093) (None) Spoken Language Comprehension Goal Status (O6712) (None) Spoken Language Comprehension Discharge Status (W5809) (None) Spoken Language Expression Current Status (X8338) (None) Spoken Language Expression Goal Status (S5053) (None) Spoken Language Expression Discharge Status [phone]) (None) Attention Current Status (A1937) (None) Attention Goal Status (T0240) (None) Attention Discharge Status (X7353) (None) Memory Current Status (G9924) (None) Memory Goal Status (Q6834)  (None) Memory Discharge Status (H9622) (None) Voice Current Status (W9798) (None) Voice Goal Status (X2119) (None)  Voice Discharge Status [phone]) (None) Other Speech-Language Pathology Functional Limitation Current Status [phone]) (None) Other Speech-Language Pathology Functional Limitation Goal Status (N0272) (None) Other Speech-Language Pathology Functional Limitation Discharge Status [phone]) (None) Janett Labella [place], [place] [facility] SLP [phone]              US Abdomen Limited Ruq  Result Date: 08/14/2016 CLINICAL DATA:  83 year old hypertensive diabetic male with fever. Initial encounter. EXAM: ULTRASOUND ABDOMEN LIMITED RIGHT UPPER QUADRANT COMPARISON:  08/13/2016 CT. FINDINGS: Gallbladder: Gallbladder sludge without discrete gallstone. Gallbladder wall thickening measuring up to 5 mm. Minimal amount of pericholecystic fluid. Per ultrasound technologist, patient was not tender over this region during scanning. Common bile duct: Diameter: 3.8 mm. Liver: Multiple liver cysts measuring up to 3 cm. 1.7 cm slightly complex cyst along the dome of the liver. No intrahepatic biliary duct dilation. Portal vein is patent. Right-sided pleural effusion. IMPRESSION: Gallbladder wall thickening with minimal amount of pericholecystic fluid. Gallbladder sludge without stone identified. Patient was not tender over this region during scanning per ultrasound technologist. It is possible findings are related to increased right heart pressure or liver dysfunction however, cholecystitis cannot be excluded in proper clinical setting. Multiple liver cysts measuring up to 3 cm. 1.7 cm cyst along the dome liver appears slightly complex. Right-sided pleural effusion. Electronically Signed   By: Genia Del M.D.   On: 08/14/2016 07:31    Assessment/Plan:   Salmonella sepsis ([place])  Salmonella bacteremia  Cellulitis of right lower extremity  New onset a-fib ([place])  Hypertensive heart disease with chronic systolic  congestive heart failure (HCC)  Coronary artery disease of native artery of native heart with stable angina pectoris (HCC)  Chronic systolic CHF (congestive heart failure) (HCC)  Kidney disease, chronic, stage III (GFR 30-59 ml/min)  Type 2 diabetes mellitus with diabetic nephropathy, without long-term current use of insulin ([place])  Cellulitis in diabetic foot ([place])   Patient is being discharged with the following home health services:  OT/PT/Nursing  Patient is being discharged with the following durable medical equipment:  Wheelchair  Patient has been advised to f/u with their PCP in 1-2 weeks to bring them up to date on their rehab stay.  Social services at facility was responsible for arranging this appointment.  Pt was provided with a 30 day supply of prescriptions for medications and refills must be obtained from their PCP.  For controlled substances, a more limited supply may be provided adequate until PCP appointment only.  Medications have been reconciled.  Time spent greater than 30 minutes;> 50% of time with patient was spent reviewing records, labs, tests and studies, counseling and developing plan of care  Aaron Stephenson. Aaron Coil, MD

## 2016-09-05 ENCOUNTER — Other Ambulatory Visit: Payer: Self-pay | Admitting: *Deleted

## 2016-09-05 NOTE — Patient Outreach (Signed)
[name] Care Management  09/05/2016  Aaron Stephenson 01-13-1925 [phone]   Met with facility staff, confirmed patient discharged to [facility]F/[facility]. No [facility] care management needs identified.  Will sign off. Royetta Crochet. Laymond Purser, RN, BSN, [place] [phone]) Business Cell  [phone]) Toll Free Office

## 2016-09-07 LAB — CULTURE, BLOOD (ROUTINE X 2): Special Requests: ADEQUATE

## 2016-09-11 DIAGNOSIS — I872 Venous insufficiency (chronic) (peripheral): Secondary | ICD-10-CM | POA: Diagnosis not present

## 2016-09-11 DIAGNOSIS — Z7901 Long term (current) use of anticoagulants: Secondary | ICD-10-CM | POA: Diagnosis not present

## 2016-09-11 DIAGNOSIS — E1122 Type 2 diabetes mellitus with diabetic chronic kidney disease: Secondary | ICD-10-CM | POA: Diagnosis not present

## 2016-09-11 DIAGNOSIS — M1990 Unspecified osteoarthritis, unspecified site: Secondary | ICD-10-CM | POA: Diagnosis not present

## 2016-09-11 DIAGNOSIS — F0390 Unspecified dementia without behavioral disturbance: Secondary | ICD-10-CM | POA: Diagnosis not present

## 2016-09-11 DIAGNOSIS — I252 Old myocardial infarction: Secondary | ICD-10-CM | POA: Diagnosis not present

## 2016-09-11 DIAGNOSIS — N183 Chronic kidney disease, stage 3 (moderate): Secondary | ICD-10-CM | POA: Diagnosis not present

## 2016-09-11 DIAGNOSIS — I2510 Atherosclerotic heart disease of native coronary artery without angina pectoris: Secondary | ICD-10-CM | POA: Diagnosis not present

## 2016-09-11 DIAGNOSIS — I129 Hypertensive chronic kidney disease with stage 1 through stage 4 chronic kidney disease, or unspecified chronic kidney disease: Secondary | ICD-10-CM | POA: Diagnosis not present

## 2016-09-11 DIAGNOSIS — M48061 Spinal stenosis, lumbar region without neurogenic claudication: Secondary | ICD-10-CM | POA: Diagnosis not present

## 2016-09-12 DIAGNOSIS — I129 Hypertensive chronic kidney disease with stage 1 through stage 4 chronic kidney disease, or unspecified chronic kidney disease: Secondary | ICD-10-CM | POA: Diagnosis not present

## 2016-09-12 DIAGNOSIS — I872 Venous insufficiency (chronic) (peripheral): Secondary | ICD-10-CM | POA: Diagnosis not present

## 2016-09-12 DIAGNOSIS — F0390 Unspecified dementia without behavioral disturbance: Secondary | ICD-10-CM | POA: Diagnosis not present

## 2016-09-12 DIAGNOSIS — I2510 Atherosclerotic heart disease of native coronary artery without angina pectoris: Secondary | ICD-10-CM | POA: Diagnosis not present

## 2016-09-12 DIAGNOSIS — E1122 Type 2 diabetes mellitus with diabetic chronic kidney disease: Secondary | ICD-10-CM | POA: Diagnosis not present

## 2016-09-12 DIAGNOSIS — N183 Chronic kidney disease, stage 3 (moderate): Secondary | ICD-10-CM | POA: Diagnosis not present

## 2016-09-14 DIAGNOSIS — F0390 Unspecified dementia without behavioral disturbance: Secondary | ICD-10-CM | POA: Diagnosis not present

## 2016-09-14 DIAGNOSIS — N183 Chronic kidney disease, stage 3 (moderate): Secondary | ICD-10-CM | POA: Diagnosis not present

## 2016-09-14 DIAGNOSIS — I2510 Atherosclerotic heart disease of native coronary artery without angina pectoris: Secondary | ICD-10-CM | POA: Diagnosis not present

## 2016-09-14 DIAGNOSIS — E1122 Type 2 diabetes mellitus with diabetic chronic kidney disease: Secondary | ICD-10-CM | POA: Diagnosis not present

## 2016-09-14 DIAGNOSIS — I129 Hypertensive chronic kidney disease with stage 1 through stage 4 chronic kidney disease, or unspecified chronic kidney disease: Secondary | ICD-10-CM | POA: Diagnosis not present

## 2016-09-14 DIAGNOSIS — I872 Venous insufficiency (chronic) (peripheral): Secondary | ICD-10-CM | POA: Diagnosis not present

## 2016-09-18 DIAGNOSIS — N183 Chronic kidney disease, stage 3 (moderate): Secondary | ICD-10-CM | POA: Diagnosis not present

## 2016-09-18 DIAGNOSIS — E1122 Type 2 diabetes mellitus with diabetic chronic kidney disease: Secondary | ICD-10-CM | POA: Diagnosis not present

## 2016-09-18 DIAGNOSIS — F0390 Unspecified dementia without behavioral disturbance: Secondary | ICD-10-CM | POA: Diagnosis not present

## 2016-09-18 DIAGNOSIS — I872 Venous insufficiency (chronic) (peripheral): Secondary | ICD-10-CM | POA: Diagnosis not present

## 2016-09-18 DIAGNOSIS — I129 Hypertensive chronic kidney disease with stage 1 through stage 4 chronic kidney disease, or unspecified chronic kidney disease: Secondary | ICD-10-CM | POA: Diagnosis not present

## 2016-09-18 DIAGNOSIS — I2510 Atherosclerotic heart disease of native coronary artery without angina pectoris: Secondary | ICD-10-CM | POA: Diagnosis not present

## 2016-09-19 DIAGNOSIS — I4891 Unspecified atrial fibrillation: Secondary | ICD-10-CM | POA: Diagnosis not present

## 2016-09-19 DIAGNOSIS — G308 Other Alzheimer's disease: Secondary | ICD-10-CM | POA: Diagnosis not present

## 2016-09-19 DIAGNOSIS — E785 Hyperlipidemia, unspecified: Secondary | ICD-10-CM | POA: Diagnosis not present

## 2016-09-19 DIAGNOSIS — N400 Enlarged prostate without lower urinary tract symptoms: Secondary | ICD-10-CM | POA: Diagnosis not present

## 2016-09-19 DIAGNOSIS — N183 Chronic kidney disease, stage 3 (moderate): Secondary | ICD-10-CM | POA: Diagnosis not present

## 2016-09-19 DIAGNOSIS — R2689 Other abnormalities of gait and mobility: Secondary | ICD-10-CM | POA: Diagnosis not present

## 2016-09-19 DIAGNOSIS — E119 Type 2 diabetes mellitus without complications: Secondary | ICD-10-CM | POA: Diagnosis not present

## 2016-09-19 DIAGNOSIS — I10 Essential (primary) hypertension: Secondary | ICD-10-CM | POA: Diagnosis not present

## 2016-09-20 DIAGNOSIS — E1122 Type 2 diabetes mellitus with diabetic chronic kidney disease: Secondary | ICD-10-CM | POA: Diagnosis not present

## 2016-09-20 DIAGNOSIS — F0390 Unspecified dementia without behavioral disturbance: Secondary | ICD-10-CM | POA: Diagnosis not present

## 2016-09-20 DIAGNOSIS — I129 Hypertensive chronic kidney disease with stage 1 through stage 4 chronic kidney disease, or unspecified chronic kidney disease: Secondary | ICD-10-CM | POA: Diagnosis not present

## 2016-09-20 DIAGNOSIS — I2510 Atherosclerotic heart disease of native coronary artery without angina pectoris: Secondary | ICD-10-CM | POA: Diagnosis not present

## 2016-09-20 DIAGNOSIS — N183 Chronic kidney disease, stage 3 (moderate): Secondary | ICD-10-CM | POA: Diagnosis not present

## 2016-09-20 DIAGNOSIS — I872 Venous insufficiency (chronic) (peripheral): Secondary | ICD-10-CM | POA: Diagnosis not present

## 2016-09-20 DIAGNOSIS — J189 Pneumonia, unspecified organism: Secondary | ICD-10-CM | POA: Diagnosis not present

## 2016-09-21 DIAGNOSIS — E1122 Type 2 diabetes mellitus with diabetic chronic kidney disease: Secondary | ICD-10-CM | POA: Diagnosis not present

## 2016-09-21 DIAGNOSIS — E785 Hyperlipidemia, unspecified: Secondary | ICD-10-CM | POA: Diagnosis not present

## 2016-09-21 DIAGNOSIS — I872 Venous insufficiency (chronic) (peripheral): Secondary | ICD-10-CM | POA: Diagnosis not present

## 2016-09-21 DIAGNOSIS — N183 Chronic kidney disease, stage 3 (moderate): Secondary | ICD-10-CM | POA: Diagnosis not present

## 2016-09-21 DIAGNOSIS — Z79899 Other long term (current) drug therapy: Secondary | ICD-10-CM | POA: Diagnosis not present

## 2016-09-21 DIAGNOSIS — I129 Hypertensive chronic kidney disease with stage 1 through stage 4 chronic kidney disease, or unspecified chronic kidney disease: Secondary | ICD-10-CM | POA: Diagnosis not present

## 2016-09-21 DIAGNOSIS — F0390 Unspecified dementia without behavioral disturbance: Secondary | ICD-10-CM | POA: Diagnosis not present

## 2016-09-21 DIAGNOSIS — I2510 Atherosclerotic heart disease of native coronary artery without angina pectoris: Secondary | ICD-10-CM | POA: Diagnosis not present

## 2016-09-22 DIAGNOSIS — I2510 Atherosclerotic heart disease of native coronary artery without angina pectoris: Secondary | ICD-10-CM | POA: Diagnosis not present

## 2016-09-22 DIAGNOSIS — I129 Hypertensive chronic kidney disease with stage 1 through stage 4 chronic kidney disease, or unspecified chronic kidney disease: Secondary | ICD-10-CM | POA: Diagnosis not present

## 2016-09-22 DIAGNOSIS — N183 Chronic kidney disease, stage 3 (moderate): Secondary | ICD-10-CM | POA: Diagnosis not present

## 2016-09-22 DIAGNOSIS — E1122 Type 2 diabetes mellitus with diabetic chronic kidney disease: Secondary | ICD-10-CM | POA: Diagnosis not present

## 2016-09-22 DIAGNOSIS — F0390 Unspecified dementia without behavioral disturbance: Secondary | ICD-10-CM | POA: Diagnosis not present

## 2016-09-22 DIAGNOSIS — I872 Venous insufficiency (chronic) (peripheral): Secondary | ICD-10-CM | POA: Diagnosis not present

## 2016-09-25 DIAGNOSIS — N183 Chronic kidney disease, stage 3 (moderate): Secondary | ICD-10-CM | POA: Diagnosis not present

## 2016-09-25 DIAGNOSIS — E1122 Type 2 diabetes mellitus with diabetic chronic kidney disease: Secondary | ICD-10-CM | POA: Diagnosis not present

## 2016-09-25 DIAGNOSIS — F0390 Unspecified dementia without behavioral disturbance: Secondary | ICD-10-CM | POA: Diagnosis not present

## 2016-09-25 DIAGNOSIS — I2510 Atherosclerotic heart disease of native coronary artery without angina pectoris: Secondary | ICD-10-CM | POA: Diagnosis not present

## 2016-09-25 DIAGNOSIS — I129 Hypertensive chronic kidney disease with stage 1 through stage 4 chronic kidney disease, or unspecified chronic kidney disease: Secondary | ICD-10-CM | POA: Diagnosis not present

## 2016-09-25 DIAGNOSIS — I872 Venous insufficiency (chronic) (peripheral): Secondary | ICD-10-CM | POA: Diagnosis not present

## 2016-09-26 DIAGNOSIS — I4891 Unspecified atrial fibrillation: Secondary | ICD-10-CM | POA: Diagnosis not present

## 2016-09-26 DIAGNOSIS — E1122 Type 2 diabetes mellitus with diabetic chronic kidney disease: Secondary | ICD-10-CM | POA: Diagnosis not present

## 2016-09-26 DIAGNOSIS — I10 Essential (primary) hypertension: Secondary | ICD-10-CM | POA: Diagnosis not present

## 2016-09-26 DIAGNOSIS — N400 Enlarged prostate without lower urinary tract symptoms: Secondary | ICD-10-CM | POA: Diagnosis not present

## 2016-09-26 DIAGNOSIS — N183 Chronic kidney disease, stage 3 (moderate): Secondary | ICD-10-CM | POA: Diagnosis not present

## 2016-09-26 DIAGNOSIS — E119 Type 2 diabetes mellitus without complications: Secondary | ICD-10-CM | POA: Diagnosis not present

## 2016-09-26 DIAGNOSIS — I2510 Atherosclerotic heart disease of native coronary artery without angina pectoris: Secondary | ICD-10-CM | POA: Diagnosis not present

## 2016-09-26 DIAGNOSIS — G308 Other Alzheimer's disease: Secondary | ICD-10-CM | POA: Diagnosis not present

## 2016-09-26 DIAGNOSIS — I129 Hypertensive chronic kidney disease with stage 1 through stage 4 chronic kidney disease, or unspecified chronic kidney disease: Secondary | ICD-10-CM | POA: Diagnosis not present

## 2016-09-26 DIAGNOSIS — E785 Hyperlipidemia, unspecified: Secondary | ICD-10-CM | POA: Diagnosis not present

## 2016-09-26 DIAGNOSIS — K4090 Unilateral inguinal hernia, without obstruction or gangrene, not specified as recurrent: Secondary | ICD-10-CM | POA: Diagnosis not present

## 2016-09-26 DIAGNOSIS — Z79899 Other long term (current) drug therapy: Secondary | ICD-10-CM | POA: Diagnosis not present

## 2016-09-26 DIAGNOSIS — F0390 Unspecified dementia without behavioral disturbance: Secondary | ICD-10-CM | POA: Diagnosis not present

## 2016-09-26 DIAGNOSIS — I872 Venous insufficiency (chronic) (peripheral): Secondary | ICD-10-CM | POA: Diagnosis not present

## 2016-09-27 DIAGNOSIS — F0390 Unspecified dementia without behavioral disturbance: Secondary | ICD-10-CM | POA: Diagnosis not present

## 2016-09-27 DIAGNOSIS — E1122 Type 2 diabetes mellitus with diabetic chronic kidney disease: Secondary | ICD-10-CM | POA: Diagnosis not present

## 2016-09-27 DIAGNOSIS — I129 Hypertensive chronic kidney disease with stage 1 through stage 4 chronic kidney disease, or unspecified chronic kidney disease: Secondary | ICD-10-CM | POA: Diagnosis not present

## 2016-09-27 DIAGNOSIS — I2510 Atherosclerotic heart disease of native coronary artery without angina pectoris: Secondary | ICD-10-CM | POA: Diagnosis not present

## 2016-09-27 DIAGNOSIS — I872 Venous insufficiency (chronic) (peripheral): Secondary | ICD-10-CM | POA: Diagnosis not present

## 2016-09-27 DIAGNOSIS — N183 Chronic kidney disease, stage 3 (moderate): Secondary | ICD-10-CM | POA: Diagnosis not present

## 2016-09-28 DIAGNOSIS — F0390 Unspecified dementia without behavioral disturbance: Secondary | ICD-10-CM | POA: Diagnosis not present

## 2016-09-28 DIAGNOSIS — I129 Hypertensive chronic kidney disease with stage 1 through stage 4 chronic kidney disease, or unspecified chronic kidney disease: Secondary | ICD-10-CM | POA: Diagnosis not present

## 2016-09-28 DIAGNOSIS — I2510 Atherosclerotic heart disease of native coronary artery without angina pectoris: Secondary | ICD-10-CM | POA: Diagnosis not present

## 2016-09-28 DIAGNOSIS — N183 Chronic kidney disease, stage 3 (moderate): Secondary | ICD-10-CM | POA: Diagnosis not present

## 2016-09-28 DIAGNOSIS — E1122 Type 2 diabetes mellitus with diabetic chronic kidney disease: Secondary | ICD-10-CM | POA: Diagnosis not present

## 2016-09-28 DIAGNOSIS — I872 Venous insufficiency (chronic) (peripheral): Secondary | ICD-10-CM | POA: Diagnosis not present

## 2016-09-29 DIAGNOSIS — R972 Elevated prostate specific antigen [PSA]: Secondary | ICD-10-CM | POA: Diagnosis not present

## 2016-09-29 DIAGNOSIS — R339 Retention of urine, unspecified: Secondary | ICD-10-CM | POA: Diagnosis not present

## 2016-09-29 DIAGNOSIS — N183 Chronic kidney disease, stage 3 (moderate): Secondary | ICD-10-CM | POA: Diagnosis not present

## 2016-09-29 DIAGNOSIS — R8290 Unspecified abnormal findings in urine: Secondary | ICD-10-CM | POA: Diagnosis not present

## 2016-09-29 DIAGNOSIS — N281 Cyst of kidney, acquired: Secondary | ICD-10-CM | POA: Diagnosis not present

## 2016-09-29 DIAGNOSIS — C61 Malignant neoplasm of prostate: Secondary | ICD-10-CM | POA: Diagnosis not present

## 2016-10-02 DIAGNOSIS — Z833 Family history of diabetes mellitus: Secondary | ICD-10-CM | POA: Diagnosis not present

## 2016-10-02 DIAGNOSIS — N183 Chronic kidney disease, stage 3 (moderate): Secondary | ICD-10-CM | POA: Diagnosis not present

## 2016-10-02 DIAGNOSIS — E1122 Type 2 diabetes mellitus with diabetic chronic kidney disease: Secondary | ICD-10-CM | POA: Diagnosis not present

## 2016-10-02 DIAGNOSIS — L605 Yellow nail syndrome: Secondary | ICD-10-CM | POA: Diagnosis not present

## 2016-10-02 DIAGNOSIS — R609 Edema, unspecified: Secondary | ICD-10-CM | POA: Diagnosis not present

## 2016-10-02 DIAGNOSIS — E1359 Other specified diabetes mellitus with other circulatory complications: Secondary | ICD-10-CM | POA: Diagnosis not present

## 2016-10-02 DIAGNOSIS — I2510 Atherosclerotic heart disease of native coronary artery without angina pectoris: Secondary | ICD-10-CM | POA: Diagnosis not present

## 2016-10-02 DIAGNOSIS — I872 Venous insufficiency (chronic) (peripheral): Secondary | ICD-10-CM | POA: Diagnosis not present

## 2016-10-02 DIAGNOSIS — F0390 Unspecified dementia without behavioral disturbance: Secondary | ICD-10-CM | POA: Diagnosis not present

## 2016-10-02 DIAGNOSIS — I129 Hypertensive chronic kidney disease with stage 1 through stage 4 chronic kidney disease, or unspecified chronic kidney disease: Secondary | ICD-10-CM | POA: Diagnosis not present

## 2016-10-02 DIAGNOSIS — B351 Tinea unguium: Secondary | ICD-10-CM | POA: Diagnosis not present

## 2016-10-04 DIAGNOSIS — I872 Venous insufficiency (chronic) (peripheral): Secondary | ICD-10-CM | POA: Diagnosis not present

## 2016-10-04 DIAGNOSIS — I129 Hypertensive chronic kidney disease with stage 1 through stage 4 chronic kidney disease, or unspecified chronic kidney disease: Secondary | ICD-10-CM | POA: Diagnosis not present

## 2016-10-04 DIAGNOSIS — I2510 Atherosclerotic heart disease of native coronary artery without angina pectoris: Secondary | ICD-10-CM | POA: Diagnosis not present

## 2016-10-04 DIAGNOSIS — E1122 Type 2 diabetes mellitus with diabetic chronic kidney disease: Secondary | ICD-10-CM | POA: Diagnosis not present

## 2016-10-04 DIAGNOSIS — N183 Chronic kidney disease, stage 3 (moderate): Secondary | ICD-10-CM | POA: Diagnosis not present

## 2016-10-04 DIAGNOSIS — F0390 Unspecified dementia without behavioral disturbance: Secondary | ICD-10-CM | POA: Diagnosis not present

## 2016-10-06 ENCOUNTER — Emergency Department (HOSPITAL_COMMUNITY): Payer: Medicare Other

## 2016-10-06 ENCOUNTER — Encounter (HOSPITAL_COMMUNITY): Payer: Self-pay | Admitting: Emergency Medicine

## 2016-10-06 ENCOUNTER — Inpatient Hospital Stay (HOSPITAL_COMMUNITY)
Admission: EM | Admit: 2016-10-06 | Discharge: 2016-10-09 | DRG: 085 | Disposition: A | Discharge status: Home Health | Payer: Medicare Other | Attending: Family Medicine | Admitting: Family Medicine

## 2016-10-06 ENCOUNTER — Inpatient Hospital Stay (HOSPITAL_COMMUNITY): Payer: Medicare Other

## 2016-10-06 DIAGNOSIS — Z880 Allergy status to penicillin: Secondary | ICD-10-CM

## 2016-10-06 DIAGNOSIS — T148XXA Other injury of unspecified body region, initial encounter: Secondary | ICD-10-CM

## 2016-10-06 DIAGNOSIS — I5022 Chronic systolic (congestive) heart failure: Secondary | ICD-10-CM | POA: Diagnosis not present

## 2016-10-06 DIAGNOSIS — Z8546 Personal history of malignant neoplasm of prostate: Secondary | ICD-10-CM

## 2016-10-06 DIAGNOSIS — K210 Gastro-esophageal reflux disease with esophagitis: Secondary | ICD-10-CM | POA: Diagnosis present

## 2016-10-06 DIAGNOSIS — E785 Hyperlipidemia, unspecified: Secondary | ICD-10-CM | POA: Diagnosis present

## 2016-10-06 DIAGNOSIS — I872 Venous insufficiency (chronic) (peripheral): Secondary | ICD-10-CM | POA: Diagnosis not present

## 2016-10-06 DIAGNOSIS — Z66 Do not resuscitate: Secondary | ICD-10-CM | POA: Diagnosis present

## 2016-10-06 DIAGNOSIS — Z8249 Family history of ischemic heart disease and other diseases of the circulatory system: Secondary | ICD-10-CM | POA: Diagnosis not present

## 2016-10-06 DIAGNOSIS — Z888 Allergy status to other drugs, medicaments and biological substances status: Secondary | ICD-10-CM

## 2016-10-06 DIAGNOSIS — I5042 Chronic combined systolic (congestive) and diastolic (congestive) heart failure: Secondary | ICD-10-CM | POA: Diagnosis present

## 2016-10-06 DIAGNOSIS — Z23 Encounter for immunization: Secondary | ICD-10-CM

## 2016-10-06 DIAGNOSIS — F0390 Unspecified dementia without behavioral disturbance: Secondary | ICD-10-CM | POA: Diagnosis present

## 2016-10-06 DIAGNOSIS — Z885 Allergy status to narcotic agent status: Secondary | ICD-10-CM

## 2016-10-06 DIAGNOSIS — N400 Enlarged prostate without lower urinary tract symptoms: Secondary | ICD-10-CM | POA: Diagnosis present

## 2016-10-06 DIAGNOSIS — I130 Hypertensive heart and chronic kidney disease with heart failure and stage 1 through stage 4 chronic kidney disease, or unspecified chronic kidney disease: Secondary | ICD-10-CM | POA: Diagnosis present

## 2016-10-06 DIAGNOSIS — E860 Dehydration: Secondary | ICD-10-CM | POA: Diagnosis present

## 2016-10-06 DIAGNOSIS — M25521 Pain in right elbow: Secondary | ICD-10-CM | POA: Diagnosis not present

## 2016-10-06 DIAGNOSIS — N183 Chronic kidney disease, stage 3 unspecified: Secondary | ICD-10-CM | POA: Diagnosis present

## 2016-10-06 DIAGNOSIS — S065X0A Traumatic subdural hemorrhage without loss of consciousness, initial encounter: Secondary | ICD-10-CM | POA: Diagnosis present

## 2016-10-06 DIAGNOSIS — N179 Acute kidney failure, unspecified: Secondary | ICD-10-CM | POA: Diagnosis present

## 2016-10-06 DIAGNOSIS — F05 Delirium due to known physiological condition: Secondary | ICD-10-CM | POA: Diagnosis present

## 2016-10-06 DIAGNOSIS — S59901A Unspecified injury of right elbow, initial encounter: Secondary | ICD-10-CM | POA: Diagnosis not present

## 2016-10-06 DIAGNOSIS — J90 Pleural effusion, not elsewhere classified: Secondary | ICD-10-CM | POA: Diagnosis not present

## 2016-10-06 DIAGNOSIS — D696 Thrombocytopenia, unspecified: Secondary | ICD-10-CM | POA: Diagnosis present

## 2016-10-06 DIAGNOSIS — E1122 Type 2 diabetes mellitus with diabetic chronic kidney disease: Secondary | ICD-10-CM | POA: Diagnosis present

## 2016-10-06 DIAGNOSIS — D45 Polycythemia vera: Secondary | ICD-10-CM | POA: Diagnosis present

## 2016-10-06 DIAGNOSIS — I272 Pulmonary hypertension, unspecified: Secondary | ICD-10-CM | POA: Diagnosis present

## 2016-10-06 DIAGNOSIS — R55 Syncope and collapse: Secondary | ICD-10-CM | POA: Diagnosis not present

## 2016-10-06 DIAGNOSIS — Z7984 Long term (current) use of oral hypoglycemic drugs: Secondary | ICD-10-CM

## 2016-10-06 DIAGNOSIS — I252 Old myocardial infarction: Secondary | ICD-10-CM | POA: Diagnosis not present

## 2016-10-06 DIAGNOSIS — Y92121 Bathroom in nursing home as the place of occurrence of the external cause: Secondary | ICD-10-CM | POA: Diagnosis not present

## 2016-10-06 DIAGNOSIS — W1839XA Other fall on same level, initial encounter: Secondary | ICD-10-CM | POA: Diagnosis not present

## 2016-10-06 DIAGNOSIS — E1121 Type 2 diabetes mellitus with diabetic nephropathy: Secondary | ICD-10-CM | POA: Diagnosis not present

## 2016-10-06 DIAGNOSIS — S065XAA Traumatic subdural hemorrhage with loss of consciousness status unknown, initial encounter: Secondary | ICD-10-CM

## 2016-10-06 DIAGNOSIS — S50311A Abrasion of right elbow, initial encounter: Secondary | ICD-10-CM | POA: Diagnosis not present

## 2016-10-06 DIAGNOSIS — I110 Hypertensive heart disease with heart failure: Secondary | ICD-10-CM | POA: Diagnosis present

## 2016-10-06 DIAGNOSIS — S41109A Unspecified open wound of unspecified upper arm, initial encounter: Secondary | ICD-10-CM | POA: Diagnosis not present

## 2016-10-06 DIAGNOSIS — I480 Paroxysmal atrial fibrillation: Secondary | ICD-10-CM | POA: Diagnosis present

## 2016-10-06 DIAGNOSIS — S199XXA Unspecified injury of neck, initial encounter: Secondary | ICD-10-CM | POA: Diagnosis not present

## 2016-10-06 DIAGNOSIS — I340 Nonrheumatic mitral (valve) insufficiency: Secondary | ICD-10-CM | POA: Diagnosis not present

## 2016-10-06 DIAGNOSIS — Z833 Family history of diabetes mellitus: Secondary | ICD-10-CM | POA: Diagnosis not present

## 2016-10-06 DIAGNOSIS — Z841 Family history of disorders of kidney and ureter: Secondary | ICD-10-CM

## 2016-10-06 DIAGNOSIS — I214 Non-ST elevation (NSTEMI) myocardial infarction: Secondary | ICD-10-CM | POA: Diagnosis present

## 2016-10-06 DIAGNOSIS — Z886 Allergy status to analgesic agent status: Secondary | ICD-10-CM

## 2016-10-06 DIAGNOSIS — Z881 Allergy status to other antibiotic agents status: Secondary | ICD-10-CM

## 2016-10-06 DIAGNOSIS — Z882 Allergy status to sulfonamides status: Secondary | ICD-10-CM

## 2016-10-06 DIAGNOSIS — M542 Cervicalgia: Secondary | ICD-10-CM | POA: Diagnosis not present

## 2016-10-06 DIAGNOSIS — R9431 Abnormal electrocardiogram [ECG] [EKG]: Secondary | ICD-10-CM | POA: Diagnosis not present

## 2016-10-06 DIAGNOSIS — R39198 Other difficulties with micturition: Secondary | ICD-10-CM | POA: Diagnosis present

## 2016-10-06 DIAGNOSIS — Z96641 Presence of right artificial hip joint: Secondary | ICD-10-CM | POA: Diagnosis present

## 2016-10-06 DIAGNOSIS — R9430 Abnormal result of cardiovascular function study, unspecified: Secondary | ICD-10-CM | POA: Diagnosis present

## 2016-10-06 DIAGNOSIS — Z79899 Other long term (current) drug therapy: Secondary | ICD-10-CM

## 2016-10-06 DIAGNOSIS — M170 Bilateral primary osteoarthritis of knee: Secondary | ICD-10-CM | POA: Diagnosis not present

## 2016-10-06 DIAGNOSIS — I2510 Atherosclerotic heart disease of native coronary artery without angina pectoris: Secondary | ICD-10-CM | POA: Diagnosis present

## 2016-10-06 DIAGNOSIS — I25118 Atherosclerotic heart disease of native coronary artery with other forms of angina pectoris: Secondary | ICD-10-CM | POA: Diagnosis not present

## 2016-10-06 DIAGNOSIS — I959 Hypotension, unspecified: Secondary | ICD-10-CM | POA: Diagnosis present

## 2016-10-06 DIAGNOSIS — E1129 Type 2 diabetes mellitus with other diabetic kidney complication: Secondary | ICD-10-CM | POA: Diagnosis present

## 2016-10-06 DIAGNOSIS — I6200 Nontraumatic subdural hemorrhage, unspecified: Secondary | ICD-10-CM | POA: Diagnosis not present

## 2016-10-06 DIAGNOSIS — W19XXXA Unspecified fall, initial encounter: Secondary | ICD-10-CM

## 2016-10-06 DIAGNOSIS — S065X9A Traumatic subdural hemorrhage with loss of consciousness of unspecified duration, initial encounter: Secondary | ICD-10-CM

## 2016-10-06 DIAGNOSIS — Z7901 Long term (current) use of anticoagulants: Secondary | ICD-10-CM

## 2016-10-06 DIAGNOSIS — K219 Gastro-esophageal reflux disease without esophagitis: Secondary | ICD-10-CM | POA: Diagnosis present

## 2016-10-06 LAB — CBC WITH DIFFERENTIAL/PLATELET
BASOS PCT: 0 %
Basophils Absolute: 0 10*3/uL (ref 0.0–0.1)
EOS ABS: 0.1 10*3/uL (ref 0.0–0.7)
EOS PCT: 2 %
HCT: 42.3 % (ref 39.0–52.0)
Hemoglobin: 14.2 g/dL (ref 13.0–17.0)
LYMPHS ABS: 1.5 10*3/uL (ref 0.7–4.0)
Lymphocytes Relative: 22 %
MCH: 29.9 pg (ref 26.0–34.0)
MCHC: 33.6 g/dL (ref 30.0–36.0)
MCV: 89.1 fL (ref 78.0–100.0)
MONO ABS: 0.5 10*3/uL (ref 0.1–1.0)
MONOS PCT: 7 %
Neutro Abs: 4.6 10*3/uL (ref 1.7–7.7)
Neutrophils Relative %: 69 %
Platelets: 98 10*3/uL — ABNORMAL LOW (ref 150–400)
RBC: 4.75 MIL/uL (ref 4.22–5.81)
RDW: 15.7 % — AB (ref 11.5–15.5)
WBC: 6.7 10*3/uL (ref 4.0–10.5)

## 2016-10-06 LAB — MRSA PCR SCREENING: MRSA by PCR: NEGATIVE

## 2016-10-06 LAB — URINALYSIS, ROUTINE W REFLEX MICROSCOPIC
Bilirubin Urine: NEGATIVE
GLUCOSE, UA: NEGATIVE mg/dL
Hgb urine dipstick: NEGATIVE
KETONES UR: NEGATIVE mg/dL
Nitrite: NEGATIVE
PROTEIN: NEGATIVE mg/dL
SQUAMOUS EPITHELIAL / LPF: NONE SEEN
Specific Gravity, Urine: 1.014 (ref 1.005–1.030)
pH: 5 (ref 5.0–8.0)

## 2016-10-06 LAB — COMPREHENSIVE METABOLIC PANEL
ALBUMIN: 3 g/dL — AB (ref 3.5–5.0)
ALK PHOS: 53 U/L (ref 38–126)
ALT: 17 U/L (ref 17–63)
AST: 28 U/L (ref 15–41)
Anion gap: 8 (ref 5–15)
BUN: 43 mg/dL — AB (ref 6–20)
CALCIUM: 9.4 mg/dL (ref 8.9–10.3)
CHLORIDE: 108 mmol/L (ref 101–111)
CO2: 25 mmol/L (ref 22–32)
CREATININE: 1.97 mg/dL — AB (ref 0.61–1.24)
GFR calc non Af Amer: 28 mL/min — ABNORMAL LOW (ref >60)
GFR, EST AFRICAN AMERICAN: 33 mL/min — AB (ref >60)
GLUCOSE: 80 mg/dL (ref 65–99)
Potassium: 4.6 mmol/L (ref 3.5–5.1)
SODIUM: 141 mmol/L (ref 135–145)
Total Bilirubin: 0.9 mg/dL (ref 0.3–1.2)
Total Protein: 6.2 g/dL — ABNORMAL LOW (ref 6.5–8.1)

## 2016-10-06 LAB — TROPONIN I
TROPONIN I: 0.1 ng/mL — AB (ref <0.03)
Troponin I: 0.12 ng/mL (ref <0.03)
Troponin I: 0.12 ng/mL (ref <0.03)

## 2016-10-06 LAB — PROTIME-INR
INR: 1.18
PROTHROMBIN TIME: 15.1 s (ref 11.4–15.2)

## 2016-10-06 LAB — GLUCOSE, CAPILLARY
GLUCOSE-CAPILLARY: 170 mg/dL — AB (ref 65–99)
GLUCOSE-CAPILLARY: 148 mg/dL — AB (ref 65–99)

## 2016-10-06 LAB — HEMOGLOBIN A1C
Hgb A1c MFr Bld: 5.2 % (ref 4.8–5.6)
MEAN PLASMA GLUCOSE: 102.54 mg/dL

## 2016-10-06 LAB — LACTIC ACID, PLASMA: LACTIC ACID, VENOUS: 1.8 mmol/L (ref 0.5–1.9)

## 2016-10-06 MED ORDER — ACETAMINOPHEN 650 MG RE SUPP: 650.0000 mg | Freq: Four times a day (QID) | RECTAL | Status: DC | PRN

## 2016-10-06 MED ORDER — SODIUM CHLORIDE 0.9% FLUSH
3.0000 mL | Freq: Two times a day (BID) | INTRAVENOUS | Status: DC
  Administered 2016-10-06 – 2016-10-09 (×6): 3 mL via INTRAVENOUS

## 2016-10-06 MED ORDER — TAMSULOSIN HCL 0.4 MG PO CAPS
0.4000 mg | ORAL_CAPSULE | Freq: Two times a day (BID) | ORAL | Status: DC
  Administered 2016-10-07 – 2016-10-09 (×5): 0.4 mg via ORAL
  Filled 2016-10-06 (×5): qty 1

## 2016-10-06 MED ORDER — FESOTERODINE FUMARATE ER 4 MG PO TB24
4.0000 mg | ORAL_TABLET | Freq: Every day | ORAL | Status: DC
  Administered 2016-10-06 – 2016-10-09 (×4): 4 mg via ORAL
  Filled 2016-10-06 (×5): qty 1

## 2016-10-06 MED ORDER — INSULIN ASPART 100 UNIT/ML ~~LOC~~ SOLN
0.0000 [IU] | Freq: Three times a day (TID) | SUBCUTANEOUS | Status: DC
Start: 2016-10-06 — End: 2016-10-08
  Administered 2016-10-06: 2 [IU] via SUBCUTANEOUS
  Administered 2016-10-07: 1 [IU] via SUBCUTANEOUS
  Administered 2016-10-08: 2 [IU] via SUBCUTANEOUS

## 2016-10-06 MED ORDER — METOPROLOL TARTRATE 25 MG PO TABS: 25.0000 mg | ORAL_TABLET | Freq: Two times a day (BID) | ORAL | Status: DC

## 2016-10-06 MED ORDER — ONDANSETRON HCL 4 MG PO TABS: 4.0000 mg | ORAL_TABLET | Freq: Four times a day (QID) | ORAL | Status: DC | PRN

## 2016-10-06 MED ORDER — ACETAMINOPHEN 325 MG PO TABS: 650.0000 mg | ORAL_TABLET | Freq: Four times a day (QID) | ORAL | Status: DC | PRN

## 2016-10-06 MED ORDER — TETANUS-DIPHTH-ACELL PERTUSSIS 5-2.5-18.5 LF-MCG/0.5 IM SUSP
0.5000 mL | Freq: Once | INTRAMUSCULAR | Status: AC
  Administered 2016-10-06: 0.5 mL via INTRAMUSCULAR
  Filled 2016-10-06: qty 0.5

## 2016-10-06 MED ORDER — FINASTERIDE 5 MG PO TABS
5.0000 mg | ORAL_TABLET | Freq: Every day | ORAL | Status: DC
  Administered 2016-10-06 – 2016-10-08 (×3): 5 mg via ORAL
  Filled 2016-10-06 (×3): qty 1

## 2016-10-06 MED ORDER — SODIUM CHLORIDE 0.9 % IV SOLN
500.0000 mg | Freq: Two times a day (BID) | INTRAVENOUS | Status: DC
  Administered 2016-10-06 – 2016-10-07 (×2): 500 mg via INTRAVENOUS
  Filled 2016-10-06 (×4): qty 5

## 2016-10-06 MED ORDER — ATORVASTATIN CALCIUM 10 MG PO TABS
10.0000 mg | ORAL_TABLET | Freq: Every day | ORAL | Status: DC
  Administered 2016-10-06 – 2016-10-08 (×3): 10 mg via ORAL
  Filled 2016-10-06 (×3): qty 1

## 2016-10-06 MED ORDER — DOCUSATE SODIUM 100 MG PO CAPS
100.0000 mg | ORAL_CAPSULE | Freq: Every day | ORAL | Status: DC
  Administered 2016-10-07 – 2016-10-09 (×3): 100 mg via ORAL
  Filled 2016-10-06 (×3): qty 1

## 2016-10-06 MED ORDER — PROTHROMBIN COMPLEX CONC HUMAN 500 UNITS IV KIT
3519.0000 [IU] | PACK | Status: AC
  Administered 2016-10-06: 3519 [IU] via INTRAVENOUS
  Filled 2016-10-06: qty 141

## 2016-10-06 MED ORDER — SULFAMETHOXAZOLE-TRIMETHOPRIM 800-160 MG PO TABS
1.0000 | ORAL_TABLET | Freq: Every day | ORAL | Status: DC
  Administered 2016-10-07 – 2016-10-09 (×3): 1 via ORAL
  Filled 2016-10-06 (×3): qty 1

## 2016-10-06 MED ORDER — TORSEMIDE 10 MG PO TABS
10.0000 mg | ORAL_TABLET | Freq: Every day | ORAL | Status: DC
  Administered 2016-10-07: 10 mg via ORAL
  Filled 2016-10-06 (×2): qty 1

## 2016-10-06 MED ORDER — LISINOPRIL 5 MG PO TABS: 5.0000 mg | ORAL_TABLET | Freq: Every day | ORAL | Status: DC

## 2016-10-06 MED ORDER — ONDANSETRON HCL 4 MG/2ML IJ SOLN: 4.0000 mg | Freq: Four times a day (QID) | INTRAMUSCULAR | Status: DC | PRN

## 2016-10-06 MED ORDER — ORAL CARE MOUTH RINSE
15.0000 mL | Freq: Two times a day (BID) | OROMUCOSAL | Status: DC
  Administered 2016-10-06 – 2016-10-09 (×6): 15 mL via OROMUCOSAL

## 2016-10-06 MED ORDER — MIRTAZAPINE 15 MG PO TABS
15.0000 mg | ORAL_TABLET | Freq: Every day | ORAL | Status: DC
  Administered 2016-10-07 – 2016-10-08 (×2): 15 mg via ORAL
  Filled 2016-10-06 (×2): qty 1

## 2016-10-06 MED ORDER — ISOSORBIDE MONONITRATE ER 60 MG PO TB24: 60.0000 mg | ORAL_TABLET | Freq: Every day | ORAL | Status: DC

## 2016-10-06 NOTE — ED Notes (Signed)
Patient transported to X-ray 

## 2016-10-06 NOTE — ED Triage Notes (Addendum)
To ED via [facility] from [facility]) with c/o fall this am, hit head on corner of desk, skin tear to right elbow. Pt is on ELAQUIS. Pt is alert on arrival, bleeding controlled to elbow, no obvious head injury,  Pt attempted to stand and transfer from EMS stretcher to ED bed-- unable to stand with 2 assist- c/obilateral knee pain and weakness.  Daughter at bedside.  Pt has been on Bactrim for UTI- AND STARTED DIURETIC about 10 days ago-- (torsemide)

## 2016-10-06 NOTE — Consult Note (Signed)
Chief Complaint   Chief Complaint  Patient presents with  . Fall   HPI   HPI: Aaron Stephenson is a 83 y.o. male with past medical history CKD, CHF, DM, GERD, HTN, CAD, who resides in [place], presented to ER after falling. Was urinating when he started to feel lightheaded. Turned back to look at a desk in the room for unknown reason, right knee gave out, resulting in him falling backwards striking head on desk. No LOC. Initially was having headache, but this has resolved. Does have some right elbow and knee pain. Otherwise, feels well. Is on Eliquis for Afib. Has been given Kcentra. No concerns currently except wanting to eat. Denies neuro deficits. Daughter present in room states he is acting like self. Per pt, he is DNR/DNI - daughter to find living will.    Patient Active Problem List   Diagnosis Date Noted  . Cellulitis 09/04/2016  . New onset a-fib ([place]) 08/19/2016  . Hypertensive heart disease with congestive heart failure ([place]) 08/19/2016  . Chronic systolic CHF (congestive heart failure) ([place]) 08/19/2016  . Coronary artery disease 08/19/2016  . Pressure injury of skin 08/16/2016  . Salmonella bacteremia 08/16/2016  . Sepsis ([place]) 08/11/2016  . Cellulitis in diabetic foot ([place]) 08/11/2016  . HCAP (healthcare-associated pneumonia) 08/11/2016  . Bilateral knee effusions 05/01/2016  . Primary osteoarthritis of both knees 05/01/2016  . GERD (gastroesophageal reflux disease) 07/26/2015  . Toxic encephalopathy 07/21/2015  . Kidney disease, chronic, stage III (GFR 30-59 ml/min) 09/21/2014  . Abnormal cardiovascular function study 09/21/2014  . BPH (benign prostatic hypertrophy) 09/21/2014  . Hyperlipidemia   . Chronic venous insufficiency   . NSTEMI (non-ST elevated myocardial infarction) ([place]) 09/19/2014  . Type 2 diabetes mellitus with renal manifestations ([place]) 09/19/2014  . Polycythemia vera ([place]) 02/20/2011    PMH: Past Medical History:  Diagnosis Date  . BPH (benign  prostatic hyperplasia)   . BPH (benign prostatic hypertrophy) 09/21/2014  . Cellulitis in diabetic foot ([place]) 08/11/2016  . Chronic venous insufficiency   . CKD (chronic kidney disease)   . Dementia   . DM (diabetes mellitus), type 2 ([place])   . Enterococcus UTI   . Esophagitis   . GERD (gastroesophageal reflux disease)   . Hiatal hernia   . HTN (hypertension)   . Hyperlipidemia   . Kidney disease, chronic, stage III (GFR 30-59 ml/min) 09/21/2014  . Migraine   . Myeloproliferative disease ([place])   . NSTEMI (non-ST elevated myocardial infarction) ([place]) 09/19/2014  . OA (osteoarthritis)   . Physical deconditioning   . Polycythemia vera ([place]) 02/20/2011   JAK 2 mutation +   . Polycythemia vera(238.4) 02/20/2011  . Protein calorie malnutrition ([place])   . Sepsis ([place]) 08/11/2016  . Spinal stenosis, lumbar   . Thrombocytopenia ([place])   . Toxic encephalopathy   . Type 2 diabetes mellitus with renal manifestations ([place]) 09/19/2014  . Vertigo     PSH: Past Surgical History:  Procedure Laterality Date  . LOWER LEG SOFT TISSUE TUMOR EXCISION    . TOTAL HIP ARTHROPLASTY  1993   right     (Not in a hospital admission)  SH: Social History  Substance Use Topics  . Smoking status: Never Smoker  . Smokeless tobacco: Never Used  . Alcohol use Yes     Comment: rarely    MEDS: Prior to Admission medications   Medication Sig Start Date End Date Taking? Authorizing Provider  acetaminophen (TYLENOL) 325 MG tablet Take 650 mg by mouth  every 4 (four) hours as needed for moderate pain.   Yes [provider]  acetaminophen (TYLENOL) 650 MG CR tablet Take 650 mg by mouth 3 (three) times daily.    Yes [provider]  apixaban (ELIQUIS) 2.5 MG TABS tablet Take 1 tablet (2.5 mg total) by mouth 2 (two) times daily. 08/17/16  Yes Charlynne Cousins, MD  atorvastatin (LIPITOR) 10 MG tablet Take 10 mg by mouth at bedtime.   Yes [provider]  docusate sodium (COLACE) 100 MG capsule  Take 100 mg by mouth daily.   Yes [provider]  finasteride (PROSCAR) 5 MG tablet Take 5 mg by mouth at bedtime.  06/16/14  Yes [provider]  glipiZIDE (GLUCOTROL XL) 5 MG 24 hr tablet Take 5 mg by mouth daily.   Yes [provider]  isosorbide mononitrate (IMDUR) 60 MG 24 hr tablet Take 1 tablet (60 mg total) by mouth daily. 09/21/14  Yes Jacolyn Reedy, MD  lisinopril (PRINIVIL,ZESTRIL) 5 MG tablet Take 5 mg by mouth at bedtime.    Yes [provider]  metoprolol tartrate (LOPRESSOR) 50 MG tablet Take 25 mg by mouth 2 (two) times daily. 06/01/16  Yes [provider]  mirtazapine (REMERON) 15 MG tablet Take 15 mg by mouth at bedtime.   Yes [provider]  nitroGLYCERIN (NITROSTAT) 0.4 MG SL tablet Place 1 tablet (0.4 mg total) under the tongue every 5 (five) minutes x 3 doses as needed for chest pain. 09/21/14  Yes Jacolyn Reedy, MD  sulfamethoxazole-trimethoprim (BACTRIM DS,SEPTRA DS) 800-160 MG tablet Take 1 tablet by mouth 2 (two) times daily. 10/05/16 10/10/16 Yes [provider]  tamsulosin (FLOMAX) 0.4 MG CAPS capsule Take 0.4 mg by mouth 2 (two) times daily.    Yes [provider]  tolterodine (DETROL LA) 4 MG 24 hr capsule Take 4 mg by mouth at bedtime.   Yes [provider]  torsemide (DEMADEX) 10 MG tablet Take 10 mg by mouth daily.   Yes [provider]  NON FORMULARY Med Pass 2.0 120 mls twice daily with medication    [provider]    ALLERGY: Allergies  Allergen Reactions  . Penicillins Anaphylaxis, Hives and Swelling    Per MAR  Has patient had a PCN reaction causing immediate rash, facial/tongue/throat swelling, SOB or lightheadedness with hypotension: unknown Has patient had a PCN reaction causing severe rash involving mucus membranes or skin necrosis: unknown Has patient had a PCN reaction that required hospitalization: unknown Has patient had a PCN reaction occurring  within the last 10 years: unknown If all of the above answers are "NO", then may proceed with Cephalosporin use. Pts family is unable to answer  . Aspirin Other (See Comments)  . Cardura [Doxazosin]     Per MAR  . Ciprofloxacin Other (See Comments)    Per MAR Joint swelling  . Macrodantin [Nitrofurantoin Macrocrystal] Other (See Comments)    Chest pains  . Other     "lubriderm pain pill?"  . Oxycodone Other (See Comments)    Pt took OxyContin and tramadol together and almost "died"  . Sulfamethoxazole Other (See Comments)    Unknown - doesn't remember reaction  . Tramadol Other (See Comments)    Blank stare, no expression, becoming addictive  . Unisom [Doxylamine Succinate (Sleep)] Other (See Comments)    Hangover the next day  . Xanax [Alprazolam] Other (See Comments)    loopy    Social History  Substance  Use Topics  . Smoking status: Never Smoker  . Smokeless tobacco: Never Used  . Alcohol use Yes     Comment: rarely     Family History  Problem Relation Age of Onset  . Diabetes Mother   . Hypertension Mother   . Cancer Father   . Diabetes Brother   . Kidney failure Brother      ROS   Review of Systems  Constitutional: Negative.   HENT: Negative.   Eyes: Negative for blurred vision and double vision.  Respiratory: Negative.   Cardiovascular: Negative.   Gastrointestinal: Negative for nausea and vomiting.  Musculoskeletal: Positive for falls, joint pain (right elbow, right knee) and neck pain.  Skin: Negative.   Neurological: Negative for dizziness, tingling, tremors, sensory change, speech change, focal weakness, seizures, loss of consciousness and headaches.  Endo/Heme/Allergies: Negative.     Exam   Vitals:   10/06/16 1115 10/06/16 1130  BP: (!) 126/55 121/71  Pulse: 63 62  Resp: 17 (!) 22  Temp:    SpO2: 99% 100%   General appearance: Elderly male, NAD, resting comfortably Eyes: PERRL, Fundoscopic: normal Cardiovascular: Regular rate and rhythm  without murmurs, rubs, gallops. No edema or variciosities. Distal pulses normal. Pulmonary: Clear to auscultation Musculoskeletal:     Muscle tone upper extremities: Normal    Muscle tone lower extremities: Normal    Motor exam: Upper Extremities Deltoid Bicep Tricep Grip  Right 5/5 5/5 5/5 5/5  Left 5/5 5/5 5/5 5/5   Lower Extremity IP Quad PF DF EHL  Right 5/5 5/5 5/5 5/5 5/5  Left 5/5 5/5 5/5 5/5 5/5   Neurological Awake, alert, oriented Memory and concentration grossly intact Speech fluent, appropriate CNII: Visual fields normal CNIII/IV/VI: EOMI CNV: Facial sensation normal CNVII: Symmetric, normal strength CNVIII: Grossly normal CNIX: Normal palate movement CNXI: Trap and SCM strength normal CN XII: Tongue protrusion normal Sensation grossly intact to LT  Results - Imaging/Labs   Results for orders placed or performed during the hospital encounter of 10/06/16 (from the past 48 hour(s))  Comprehensive metabolic panel     Status: Abnormal   Collection Time: 10/06/16 10:56 AM  Result Value Ref Range   Sodium 141 135 - 145 mmol/L   Potassium 4.6 3.5 - 5.1 mmol/L   Chloride 108 101 - 111 mmol/L   CO2 25 22 - 32 mmol/L   Glucose, Bld 80 65 - 99 mg/dL   BUN 43 (H) 6 - 20 mg/dL   Creatinine, Ser 1.97 (H) 0.61 - 1.24 mg/dL   Calcium 9.4 8.9 - 10.3 mg/dL   Total Protein 6.2 (L) 6.5 - 8.1 g/dL   Albumin 3.0 (L) 3.5 - 5.0 g/dL   AST 28 15 - 41 U/L   ALT 17 17 - 63 U/L   Alkaline Phosphatase 53 38 - 126 U/L   Total Bilirubin 0.9 0.3 - 1.2 mg/dL   GFR calc non Af Amer 28 (L) >60 mL/min   GFR calc Af Amer 33 (L) >60 mL/min    Comment: (NOTE) The eGFR has been calculated using the CKD EPI equation. This calculation has not been validated in all clinical situations. eGFR's persistently <60 mL/min signify possible Chronic Kidney Disease.    Anion gap 8 5 - 15  CBC with Differential     Status: Abnormal   Collection Time: 10/06/16 10:56 AM  Result Value Ref Range   WBC  6.7 4.0 - 10.5 K/uL   RBC 4.75 4.22 - 5.81 MIL/uL  Hemoglobin 14.2 13.0 - 17.0 g/dL   HCT 42.3 39.0 - 52.0 %   MCV 89.1 78.0 - 100.0 fL   MCH 29.9 26.0 - 34.0 pg   MCHC 33.6 30.0 - 36.0 g/dL   RDW 15.7 (H) 11.5 - 15.5 %   Platelets 98 (L) 150 - 400 K/uL    Comment: PLATELET COUNT CONFIRMED BY SMEAR   Neutrophils Relative % 69 %   Neutro Abs 4.6 1.7 - 7.7 K/uL   Lymphocytes Relative 22 %   Lymphs Abs 1.5 0.7 - 4.0 K/uL   Monocytes Relative 7 %   Monocytes Absolute 0.5 0.1 - 1.0 K/uL   Eosinophils Relative 2 %   Eosinophils Absolute 0.1 0.0 - 0.7 K/uL   Basophils Relative 0 %   Basophils Absolute 0.0 0.0 - 0.1 K/uL  Troponin I     Status: Abnormal   Collection Time: 10/06/16 10:56 AM  Result Value Ref Range   Troponin I 0.10 (HH) <0.03 ng/mL    Comment: CRITICAL RESULT CALLED TO, READ BACK BY AND VERIFIED WITH: C.GROSE,RN 08.24.18 1214 SPIKESN/CLARKS     Dg Elbow 2 Views Right  Result Date: 10/06/2016 CLINICAL DATA:  Pain following fall EXAM: RIGHT ELBOW - 2 VIEW COMPARISON:  None. FINDINGS: Frontal and lateral views were obtained. There is soft tissue swelling. There is evidence of an avulsion along the lateral distal humeral condyle with displacement of the fracture fragment approximately 3.5 mm from the remainder of the humerus. The borders of this fragment are subtly irregular suggesting recent injury. No other evidence suggesting fracture. No dislocation. No joint effusion. There is no appreciable joint space narrowing or erosion. IMPRESSION: Recent appearing avulsion along the distal lateral humeral condyle. No other evidence of fracture. There is soft tissue swelling. No dislocation. Electronically Signed   By: Lowella Grip III M.D.   On: 10/06/2016 10:29   Ct Head Wo Contrast  Result Date: 10/06/2016 CLINICAL DATA:  Headache and neck pain following fall EXAM: CT HEAD WITHOUT CONTRAST CT CERVICAL SPINE WITHOUT CONTRAST TECHNIQUE: Multidetector CT imaging of the head and  cervical spine was performed following the standard protocol without intravenous contrast. Multiplanar CT image reconstructions of the cervical spine were also generated. COMPARISON:  Head CT Jun 27, 2016 and August 11, 2016 FINDINGS: CT HEAD FINDINGS Brain: There is mild diffuse atrophy. There is somewhat more atrophy in the frontal lobes than elsewhere, stable. There is a focal subdural hematoma in the falx measuring 7 mm in maximal thickness. There is no appreciable mass effect from this falcine subdural hematoma. No other extra-axial fluid collection evident. No intra-axial hemorrhage seen. There is no mass or midline shift. There is patchy small vessel disease in the centra semiovale bilaterally. No acute infarct evident. Vascular: No hyperdense vessels. There is mild calcification in each carotid siphon region. There is also calcification in the distal vertebral arteries. Skull: The bony calvarium appears intact. Sinuses/Orbits: There is opacification of multiple ethmoid air cells. Other paranasal sinuses are clear. There is nasal turbinate edema bilaterally. Orbits appear symmetric bilaterally. Other: Mastoid air cells are clear. CT CERVICAL SPINE FINDINGS Alignment: There is lowers cervical levoscoliosis. There is 2 mm of anterolisthesis of C5 on C6. There is just over 3 mm of anterolisthesis of C7 on T1. There is 2 mm of anterolisthesis of T1 on T2. Skull base and vertebrae: These skull base and craniocervical junction regions appear normal. There is no demonstrable fracture. There are no blastic or lytic bone lesions. Soft  tissues and spinal canal: Prevertebral soft tissues and predental space regions are normal. No paraspinous lesions. No cord or canal hematoma evident. Disc levels: There is severe disc space narrowing at C6-7, C7-T1, and T1-2, as well as other visualized thoracic levels. There is moderate disc space narrowing at C5-6. There is multilevel facet hypertrophy. There is moderately severe exit  foraminal narrowing on the left at C5-6 and C6-7. There is no frank disc extrusion or stenosis. Upper chest: Visualized upper lung regions appear unremarkable. Other: There are foci of calcification in each carotid artery. IMPRESSION: CT head: Subdural hematoma arising in the falx with maximum thickness 7 mm. No mass effect. No other extra-axial fluid. No intra-axial mass or hemorrhage. There is atrophy with patchy periventricular small vessel disease. There is no evident acute infarct. There are mild foci of arterial vascular calcification. There is opacification of multiple ethmoid air cells. There is nasal turbinate edema bilaterally. CT cervical spine: No fracture. Areas of mild spondylolisthesis are felt to be due to underlying spondylosis. There is extensive multilevel osteoarthritic change. No frank disc extrusion or stenosis. Foci of carotid artery calcification bilaterally. Critical Value/emergent results were called by telephone at the time of interpretation on 10/06/2016 at 10:57 am to Dr. Dolly Rias, ED physician, who verbally acknowledged these results. Electronically Signed   By: Lowella Grip III M.D.   On: 10/06/2016 10:58   Ct Cervical Spine Wo Contrast  Result Date: 10/06/2016 CLINICAL DATA:  Headache and neck pain following fall EXAM: CT HEAD WITHOUT CONTRAST CT CERVICAL SPINE WITHOUT CONTRAST TECHNIQUE: Multidetector CT imaging of the head and cervical spine was performed following the standard protocol without intravenous contrast. Multiplanar CT image reconstructions of the cervical spine were also generated. COMPARISON:  Head CT Jun 27, 2016 and August 11, 2016 FINDINGS: CT HEAD FINDINGS Brain: There is mild diffuse atrophy. There is somewhat more atrophy in the frontal lobes than elsewhere, stable. There is a focal subdural hematoma in the falx measuring 7 mm in maximal thickness. There is no appreciable mass effect from this falcine subdural hematoma. No other extra-axial fluid collection  evident. No intra-axial hemorrhage seen. There is no mass or midline shift. There is patchy small vessel disease in the centra semiovale bilaterally. No acute infarct evident. Vascular: No hyperdense vessels. There is mild calcification in each carotid siphon region. There is also calcification in the distal vertebral arteries. Skull: The bony calvarium appears intact. Sinuses/Orbits: There is opacification of multiple ethmoid air cells. Other paranasal sinuses are clear. There is nasal turbinate edema bilaterally. Orbits appear symmetric bilaterally. Other: Mastoid air cells are clear. CT CERVICAL SPINE FINDINGS Alignment: There is lowers cervical levoscoliosis. There is 2 mm of anterolisthesis of C5 on C6. There is just over 3 mm of anterolisthesis of C7 on T1. There is 2 mm of anterolisthesis of T1 on T2. Skull base and vertebrae: These skull base and craniocervical junction regions appear normal. There is no demonstrable fracture. There are no blastic or lytic bone lesions. Soft tissues and spinal canal: Prevertebral soft tissues and predental space regions are normal. No paraspinous lesions. No cord or canal hematoma evident. Disc levels: There is severe disc space narrowing at C6-7, C7-T1, and T1-2, as well as other visualized thoracic levels. There is moderate disc space narrowing at C5-6. There is multilevel facet hypertrophy. There is moderately severe exit foraminal narrowing on the left at C5-6 and C6-7. There is no frank disc extrusion or stenosis. Upper chest: Visualized upper lung regions appear unremarkable.  Other: There are foci of calcification in each carotid artery. IMPRESSION: CT head: Subdural hematoma arising in the falx with maximum thickness 7 mm. No mass effect. No other extra-axial fluid. No intra-axial mass or hemorrhage. There is atrophy with patchy periventricular small vessel disease. There is no evident acute infarct. There are mild foci of arterial vascular calcification. There is  opacification of multiple ethmoid air cells. There is nasal turbinate edema bilaterally. CT cervical spine: No fracture. Areas of mild spondylolisthesis are felt to be due to underlying spondylosis. There is extensive multilevel osteoarthritic change. No frank disc extrusion or stenosis. Foci of carotid artery calcification bilaterally. Critical Value/emergent results were called by telephone at the time of interpretation on 10/06/2016 at 10:57 am to Dr. Dolly Rias, ED physician, who verbally acknowledged these results. Electronically Signed   By: Lowella Grip III M.D.   On: 10/06/2016 10:58    Impression/Plan   83 y.o. male with small, 63m traumatic SDH. He is neuro intact and without deficits. No surgical intervention indicated. There is a concern that this can get worse over the next several hours due to Eliquis despite already being given Kcentra at an attempt for reversal. I had a long discussion with patient and daughter at bedside (>30 minutes) discussing the possibility of this getting worse and the next steps. We discussed at length palliative care vs surgical intervention including risks/benefits. He is already DNR/DNI (daughter to provide living will). He has elected AGAINST surgical intervention should it become necessary. Should anything worsen, he should be transferred to comfort care.  Patient is going to be admitted under hospitalist service for possible syncope work up. From a NS standpoint, we agree with the patient's decision for conservative treatment for management of his SDH. - Neuro exam q 2 hours - Repeat head CT tomorrow am just for monitoring, sooner as indicated - Keppra [id number] x7days for seizure prophylaxis - Okay for po

## 2016-10-06 NOTE — ED Provider Notes (Signed)
Emergency Department Provider Note   I have reviewed the triage vital signs and the nursing notes.   HISTORY  Chief Complaint Fall   HPI Aaron Stephenson is a 83 y.o. male CKD, DM, GERD, HTN, HLD, and CAD presents to the ED for evaluation of fall. The patient is living at [facility] and reports that he needed to get up to urinate. He ambulated to the toilet where he began to urinate. At this time he felt very lightheaded and fell backwards striking his head on a desk nearby. He reports mild headache and neck pain in addition to the right elbow pain with some bleeding there. He denies any chest pain, difficulty breathing, palpitations. No abdominal pain. Patient denies any pain in the hips or knees. No numbness or tingling. No additional lightheadedness. The patient's daughter bedside states he was recently started on Torsemide and Bactrim but is completing this course soon.    Past Medical History:  Diagnosis Date  . BPH (benign prostatic hyperplasia)   . BPH (benign prostatic hypertrophy) 09/21/2014  . Cellulitis in diabetic foot ([place]) 08/11/2016  . Chronic venous insufficiency   . CKD (chronic kidney disease)   . Dementia   . DM (diabetes mellitus), type 2 ([place])   . Enterococcus UTI   . Esophagitis   . GERD (gastroesophageal reflux disease)   . Hiatal hernia   . HTN (hypertension)   . Hyperlipidemia   . Kidney disease, chronic, stage III (GFR 30-59 ml/min) 09/21/2014  . Migraine   . Myeloproliferative disease ([place])   . NSTEMI (non-ST elevated myocardial infarction) ([place]) 09/19/2014  . OA (osteoarthritis)   . Physical deconditioning   . Polycythemia vera ([place]) 02/20/2011   JAK 2 mutation +   . Polycythemia vera(238.4) 02/20/2011  . Protein calorie malnutrition ([place])   . Sepsis ([place]) 08/11/2016  . Spinal stenosis, lumbar   . Thrombocytopenia ([place])   . Toxic encephalopathy   . Type 2 diabetes mellitus with renal manifestations ([place]) 09/19/2014  . Vertigo     Patient Active  Problem List   Diagnosis Date Noted  . Syncope 10/06/2016  . Cellulitis 09/04/2016  . New onset a-fib ([place]) 08/19/2016  . Hypertensive heart disease with congestive heart failure ([place]) 08/19/2016  . Chronic systolic CHF (congestive heart failure) ([place]) 08/19/2016  . Coronary artery disease 08/19/2016  . Pressure injury of skin 08/16/2016  . Salmonella bacteremia 08/16/2016  . Sepsis ([place]) 08/11/2016  . Cellulitis in diabetic foot ([place]) 08/11/2016  . HCAP (healthcare-associated pneumonia) 08/11/2016  . Bilateral knee effusions 05/01/2016  . Primary osteoarthritis of both knees 05/01/2016  . GERD (gastroesophageal reflux disease) 07/26/2015  . Toxic encephalopathy 07/21/2015  . Kidney disease, chronic, stage III (GFR 30-59 ml/min) 09/21/2014  . Abnormal cardiovascular function study 09/21/2014  . Benign prostatic hyperplasia 09/21/2014  . Hyperlipidemia   . Chronic venous insufficiency   . NSTEMI (non-ST elevated myocardial infarction) ([place]) 09/19/2014  . Type 2 diabetes mellitus with renal manifestations ([place]) 09/19/2014  . Polycythemia vera ([place]) 02/20/2011    Past Surgical History:  Procedure Laterality Date  . LOWER LEG SOFT TISSUE TUMOR EXCISION    . TOTAL HIP ARTHROPLASTY  1993   right    Current Outpatient Rx  . Order #: [id number] Class: Historical Med  . Order #: [id number] Class: Historical Med  . Order #: [id number] Class: Normal  . Order #: [id number] Class: Historical Med  . Order #: [id number] Class: Historical Med  . Order #: [id number] Class: Historical Med  . Order #: [id number] Class:  Historical Med  . Order #: [id number] Class: Print  . Order #: [id number] Class: Historical Med  . Order #: [id number] Class: Historical Med  . Order #: [id number] Class: Historical Med  . Order #: [id number] Class: Normal  . Order #: [id number] Class: Historical Med  . Order #: [id number] Class: Historical Med  . Order #: [id number] Class: Historical Med  . Order #: [id number] Class: Historical Med  .  Order #: [id number] Class: Historical Med    Allergies Penicillins; Aspirin; Cardura [doxazosin]; Ciprofloxacin; Macrodantin [nitrofurantoin macrocrystal]; Other; Oxycodone; Sulfamethoxazole; Tramadol; Unisom [doxylamine succinate (sleep)]; and Xanax [alprazolam]  Family History  Problem Relation Age of Onset  . Diabetes Mother   . Hypertension Mother   . Cancer Father   . Diabetes Brother   . Kidney failure Brother     Social History Social History  Substance Use Topics  . Smoking status: Never Smoker  . Smokeless tobacco: Never Used  . Alcohol use Yes     Comment: rarely    Review of Systems  Constitutional: No fever/chills Eyes: No visual changes. ENT: No sore throat. Cardiovascular: Denies chest pain. Respiratory: Denies shortness of breath. Gastrointestinal: No abdominal pain.  No nausea, no vomiting.  No diarrhea.  No constipation. Genitourinary: Negative for dysuria. Musculoskeletal: Negative for back pain. Right elbow pain.  Skin: Negative for rash. Neurological: Negative for focal weakness or numbness. Positive HA and neck pain.   10-point ROS otherwise negative.  ____________________________________________   PHYSICAL EXAM:  VITAL SIGNS: ED Triage Vitals  Enc Vitals Group     BP 10/06/16 0931 114/84     Pulse Rate 10/06/16 0931 61     Resp 10/06/16 0931 10     Temp 10/06/16 0931 (!) 97.5 F (36.4 C)     Temp Source 10/06/16 0931 Oral     SpO2 10/06/16 0931 100 %     Weight 10/06/16 0931 165 lb (74.8 kg)     Pain Score 10/06/16 0930 0   Constitutional: Alert and oriented. Well appearing and in no acute distress. Eyes: Conjunctivae are normal. Head: Atraumatic. Nose: No congestion/rhinnorhea. Mouth/Throat: Mucous membranes are slightly dry.  Neck: No stridor. No cervical spine tenderness to palpation. Cardiovascular: Normal rate, regular rhythm. Good peripheral circulation. Grossly normal heart sounds.   Respiratory: Normal respiratory effort.  No  retractions. Lungs CTAB. Gastrointestinal: Soft and nontender. No distention.  Musculoskeletal: No lower extremity tenderness nor edema. No gross deformities of extremities. Normal ROM of the bilateral hips and knees. Normal ROM of the bilateral elbows and shoulders.  Neurologic:  Normal speech and language. No gross focal neurologic deficits are appreciated. Normal CN exam 2-12.  Skin:  Skin is warm and dry. Skin tare to the right elbow.  Psychiatric: Mood and affect are normal. Speech and behavior are normal.  ____________________________________________   LABS (all labs ordered are listed, but only abnormal results are displayed)  Labs Reviewed  COMPREHENSIVE METABOLIC PANEL - Abnormal; Notable for the following:       Result Value   BUN 43 (*)    Creatinine, Ser 1.97 (*)    Total Protein 6.2 (*)    Albumin 3.0 (*)    GFR calc non Af Amer 28 (*)    GFR calc Af Amer 33 (*)    All other components within normal limits  CBC WITH DIFFERENTIAL/PLATELET - Abnormal; Notable for the following:    RDW 15.7 (*)    Platelets 98 (*)    All other components within normal limits  TROPONIN I - Abnormal; Notable for  the following:    Troponin I 0.10 (*)    All other components within normal limits  URINE CULTURE  CULTURE, BLOOD (ROUTINE X 2)  CULTURE, BLOOD (ROUTINE X 2)  PROTIME-INR  URINALYSIS, ROUTINE W REFLEX MICROSCOPIC  TROPONIN I  TROPONIN I  TROPONIN I  HEMOGLOBIN A1C  LACTIC ACID, PLASMA   ____________________________________________  EKG   EKG Interpretation  Date/Time:  Friday October 06 2016 10:53:58 EDT Ventricular Rate:  59 PR Interval:    QRS Duration: 115 QT Interval:  485 QTC Calculation: 481 R Axis:   144 Text Interpretation:  Sinus rhythm Prolonged PR interval Nonspecific intraventricular conduction delay Low voltage, extremity leads Anteroseptal infarct, age indeterminate No STEMI.  Confirmed by Nanda Quinton [phone]) on 10/06/2016 11:38:45 AM        ____________________________________________  RADIOLOGY  X-ray Chest Pa And Lateral  Result Date: 10/06/2016 CLINICAL DATA:  83 year old male with a history of weakness EXAM: CHEST  2 VIEW COMPARISON:  08/14/2016, 08/12/2016, abdominal CT 08/13/2016 FINDINGS: Cardiomediastinal silhouette unchanged with persisting cardiomegaly. Calcifications of the aortic arch. Low lung volumes, similar to prior. Mixed interstitial and airspace opacity at the right lung base, increased from the comparison plain film. There does not appear to be a correlate on the abdominal CT of 08/13/2016. Opacity at the left base obscuring the left hemidiaphragm with blunting on the costophrenic sulcus lateral view. Degenerative changes of the spine. Degenerative changes of the shoulders. No displaced fracture. IMPRESSION: Small bilateral pleural effusions, larger on the left. Mixed interstitial and airspace opacity at the right lung base, which appears new from the abdominal CT dated 08/13/2016. Leading differential includes atelectasis/ scarring and/or pneumonitis/pneumonia. Electronically Signed   By: Corrie Mckusick D.O.   On: 10/06/2016 13:41   Dg Elbow 2 Views Right  Result Date: 10/06/2016 CLINICAL DATA:  Pain following fall EXAM: RIGHT ELBOW - 2 VIEW COMPARISON:  None. FINDINGS: Frontal and lateral views were obtained. There is soft tissue swelling. There is evidence of an avulsion along the lateral distal humeral condyle with displacement of the fracture fragment approximately 3.5 mm from the remainder of the humerus. The borders of this fragment are subtly irregular suggesting recent injury. No other evidence suggesting fracture. No dislocation. No joint effusion. There is no appreciable joint space narrowing or erosion. IMPRESSION: Recent appearing avulsion along the distal lateral humeral condyle. No other evidence of fracture. There is soft tissue swelling. No dislocation. Electronically Signed   By: Lowella Grip  III M.D.   On: 10/06/2016 10:29   Ct Head Wo Contrast  Result Date: 10/06/2016 CLINICAL DATA:  Headache and neck pain following fall EXAM: CT HEAD WITHOUT CONTRAST CT CERVICAL SPINE WITHOUT CONTRAST TECHNIQUE: Multidetector CT imaging of the head and cervical spine was performed following the standard protocol without intravenous contrast. Multiplanar CT image reconstructions of the cervical spine were also generated. COMPARISON:  Head CT Jun 27, 2016 and August 11, 2016 FINDINGS: CT HEAD FINDINGS Brain: There is mild diffuse atrophy. There is somewhat more atrophy in the frontal lobes than elsewhere, stable. There is a focal subdural hematoma in the falx measuring 7 mm in maximal thickness. There is no appreciable mass effect from this falcine subdural hematoma. No other extra-axial fluid collection evident. No intra-axial hemorrhage seen. There is no mass or midline shift. There is patchy small vessel disease in the centra semiovale bilaterally. No acute infarct evident. Vascular: No hyperdense vessels. There is mild calcification in each carotid siphon region. There is also calcification  in the distal vertebral arteries. Skull: The bony calvarium appears intact. Sinuses/Orbits: There is opacification of multiple ethmoid air cells. Other paranasal sinuses are clear. There is nasal turbinate edema bilaterally. Orbits appear symmetric bilaterally. Other: Mastoid air cells are clear. CT CERVICAL SPINE FINDINGS Alignment: There is lowers cervical levoscoliosis. There is 2 mm of anterolisthesis of C5 on C6. There is just over 3 mm of anterolisthesis of C7 on T1. There is 2 mm of anterolisthesis of T1 on T2. Skull base and vertebrae: These skull base and craniocervical junction regions appear normal. There is no demonstrable fracture. There are no blastic or lytic bone lesions. Soft tissues and spinal canal: Prevertebral soft tissues and predental space regions are normal. No paraspinous lesions. No cord or canal  hematoma evident. Disc levels: There is severe disc space narrowing at C6-7, C7-T1, and T1-2, as well as other visualized thoracic levels. There is moderate disc space narrowing at C5-6. There is multilevel facet hypertrophy. There is moderately severe exit foraminal narrowing on the left at C5-6 and C6-7. There is no frank disc extrusion or stenosis. Upper chest: Visualized upper lung regions appear unremarkable. Other: There are foci of calcification in each carotid artery. IMPRESSION: CT head: Subdural hematoma arising in the falx with maximum thickness 7 mm. No mass effect. No other extra-axial fluid. No intra-axial mass or hemorrhage. There is atrophy with patchy periventricular small vessel disease. There is no evident acute infarct. There are mild foci of arterial vascular calcification. There is opacification of multiple ethmoid air cells. There is nasal turbinate edema bilaterally. CT cervical spine: No fracture. Areas of mild spondylolisthesis are felt to be due to underlying spondylosis. There is extensive multilevel osteoarthritic change. No frank disc extrusion or stenosis. Foci of carotid artery calcification bilaterally. Critical Value/emergent results were called by telephone at the time of interpretation on 10/06/2016 at 10:57 am to Dr. Dolly Rias, ED physician, who verbally acknowledged these results. Electronically Signed   By: Lowella Grip III M.D.   On: 10/06/2016 10:58   Ct Cervical Spine Wo Contrast  Result Date: 10/06/2016 CLINICAL DATA:  Headache and neck pain following fall EXAM: CT HEAD WITHOUT CONTRAST CT CERVICAL SPINE WITHOUT CONTRAST TECHNIQUE: Multidetector CT imaging of the head and cervical spine was performed following the standard protocol without intravenous contrast. Multiplanar CT image reconstructions of the cervical spine were also generated. COMPARISON:  Head CT Jun 27, 2016 and August 11, 2016 FINDINGS: CT HEAD FINDINGS Brain: There is mild diffuse atrophy. There is  somewhat more atrophy in the frontal lobes than elsewhere, stable. There is a focal subdural hematoma in the falx measuring 7 mm in maximal thickness. There is no appreciable mass effect from this falcine subdural hematoma. No other extra-axial fluid collection evident. No intra-axial hemorrhage seen. There is no mass or midline shift. There is patchy small vessel disease in the centra semiovale bilaterally. No acute infarct evident. Vascular: No hyperdense vessels. There is mild calcification in each carotid siphon region. There is also calcification in the distal vertebral arteries. Skull: The bony calvarium appears intact. Sinuses/Orbits: There is opacification of multiple ethmoid air cells. Other paranasal sinuses are clear. There is nasal turbinate edema bilaterally. Orbits appear symmetric bilaterally. Other: Mastoid air cells are clear. CT CERVICAL SPINE FINDINGS Alignment: There is lowers cervical levoscoliosis. There is 2 mm of anterolisthesis of C5 on C6. There is just over 3 mm of anterolisthesis of C7 on T1. There is 2 mm of anterolisthesis of T1 on T2. Skull base  and vertebrae: These skull base and craniocervical junction regions appear normal. There is no demonstrable fracture. There are no blastic or lytic bone lesions. Soft tissues and spinal canal: Prevertebral soft tissues and predental space regions are normal. No paraspinous lesions. No cord or canal hematoma evident. Disc levels: There is severe disc space narrowing at C6-7, C7-T1, and T1-2, as well as other visualized thoracic levels. There is moderate disc space narrowing at C5-6. There is multilevel facet hypertrophy. There is moderately severe exit foraminal narrowing on the left at C5-6 and C6-7. There is no frank disc extrusion or stenosis. Upper chest: Visualized upper lung regions appear unremarkable. Other: There are foci of calcification in each carotid artery. IMPRESSION: CT head: Subdural hematoma arising in the falx with maximum  thickness 7 mm. No mass effect. No other extra-axial fluid. No intra-axial mass or hemorrhage. There is atrophy with patchy periventricular small vessel disease. There is no evident acute infarct. There are mild foci of arterial vascular calcification. There is opacification of multiple ethmoid air cells. There is nasal turbinate edema bilaterally. CT cervical spine: No fracture. Areas of mild spondylolisthesis are felt to be due to underlying spondylosis. There is extensive multilevel osteoarthritic change. No frank disc extrusion or stenosis. Foci of carotid artery calcification bilaterally. Critical Value/emergent results were called by telephone at the time of interpretation on 10/06/2016 at 10:57 am to Dr. Dolly Rias, ED physician, who verbally acknowledged these results. Electronically Signed   By: Lowella Grip III M.D.   On: 10/06/2016 10:58    ____________________________________________   PROCEDURES  Procedure(s) performed:   Procedures  CRITICAL CARE Performed by: Margette Fast Total critical care time: 50 minutes Critical care time was exclusive of separately billable procedures and treating other patients. Critical care was necessary to treat or prevent imminent or life-threatening deterioration. Critical care was time spent personally by me on the following activities: development of treatment plan with patient and/or surrogate as well as nursing, discussions with consultants, evaluation of patient's response to treatment, examination of patient, obtaining history from patient or surrogate, ordering and performing treatments and interventions, ordering and review of laboratory studies, ordering and review of radiographic studies, pulse oximetry and re-evaluation of patient's condition.  Nanda Quinton, MD Emergency Medicine  ____________________________________________   INITIAL IMPRESSION / ASSESSMENT AND PLAN / ED COURSE  Pertinent labs & imaging results that were available  during my care of the patient were reviewed by me and considered in my medical decision making (see chart for details).  Patient resents to the emergency room in for evaluation of fall after becoming lightheaded while urinating. No chest pain, palpitations, shortness of breath. Plan for EKG, labs, and imaging of painful areas. He has full range of motion of bilateral hips and knees. Plan to debride the skin tear. We will update tetanus booster. Low suspicion for cardiac etiology of symptoms.   11:00 AM 7 mm SDH arising form the faulx. Patient continues to be awake and alert. NSG paged. Discussed with patient and daughter at bedside in detail. Patient is on Eliquis for a-fib. At this point I am concerned that this is a potentially life-threatening bleed and that Eppie Gibson should be given for attempted Eliquis reversal. Patient and daughter are considering if he would want intubation/surgery if bleed and mental status worsened. Not sure at this time.   12:30 PM Spoke with NSG PA. They will consult. Plan for hospitalist admission.   Discussed patient's case with Hospitalist to request admission. Patient and family (if  present) updated with plan. Care transferred to Hospitalist service.  I reviewed all nursing notes, vitals, pertinent old records, EKGs, labs, imaging (as available).  02:15 PM Patient remains awake and alert.  ____________________________________________  FINAL CLINICAL IMPRESSION(S) / ED DIAGNOSES  Final diagnoses:  Fall, initial encounter  SDH (subdural hematoma) ([place])  Abrasion     MEDICATIONS GIVEN DURING THIS VISIT:  Medications  sulfamethoxazole-trimethoprim (BACTRIM DS,SEPTRA DS) 800-160 MG per tablet 1 tablet (not administered)  torsemide (DEMADEX) tablet 10 mg (not administered)  metoprolol tartrate (LOPRESSOR) tablet 25 mg (not administered)  atorvastatin (LIPITOR) tablet 10 mg (not administered)  docusate sodium (COLACE) capsule 100 mg (not administered)   mirtazapine (REMERON) tablet 15 mg (not administered)  fesoterodine (TOVIAZ) tablet 4 mg (not administered)  finasteride (PROSCAR) tablet 5 mg (not administered)  isosorbide mononitrate (IMDUR) 24 hr tablet 60 mg (not administered)  tamsulosin (FLOMAX) capsule 0.4 mg (not administered)  lisinopril (PRINIVIL,ZESTRIL) tablet 5 mg (not administered)  sodium chloride flush (NS) 0.9 % injection 3 mL (not administered)  acetaminophen (TYLENOL) tablet 650 mg (not administered)    Or  acetaminophen (TYLENOL) suppository 650 mg (not administered)  ondansetron (ZOFRAN) tablet 4 mg (not administered)    Or  ondansetron (ZOFRAN) injection 4 mg (not administered)  levETIRAcetam (KEPPRA) 500 mg in sodium chloride 0.9 % 100 mL IVPB (not administered)  insulin aspart (novoLOG) injection 0-9 Units (not administered)  Tdap (BOOSTRIX) injection 0.5 mL (0.5 mLs Intramuscular Given 10/06/16 1125)  prothrombin complex conc human (KCENTRA) IVPB 3,519 Units (0 Units Intravenous Stopped 10/06/16 1211)     NEW OUTPATIENT MEDICATIONS STARTED DURING THIS VISIT:  None   Note:  This document was prepared using Dragon voice recognition software and may include unintentional dictation errors.  Nanda Quinton, MD Emergency Medicine    Raima Geathers, [facility], MD 10/06/16 [id number]

## 2016-10-06 NOTE — H&P (Signed)
History and Physical    TIP ATIENZA BDZ:[id number] DOB: Aug 21, 1924 DOA: 10/06/2016   PCP: System, Pcp Not In   Patient coming from:  Home    Chief Complaint: Syncope with fall   HPI: Aaron Stephenson is a 83 y.o. male with medical history significant for CK need, diabetes, GERD, HTN , Atrial Fib on Eliquis, hyperlipidemia, CAD, recent Salmonella bacteremia sepsis (08/2016), Nursing home resident at [facility], brought to the ER via EMS, after reporting feeling very lightheaded,  while urinating, falling backwards, striking his head on a desk nearby. He never lost consciousness. No seizure was witnessed. He denies any vision changes, or any other prodromal symptoms. No confusion is reported. He denies any chest pain, or palpitations. No fever or chills. He denies any shortness of breath and cough. He denies any similar episodes before. He denies any numbness, unilateral weakness, or tingling. He did have a couple of home meds change, including torsemide, and Bactrim, which is completing course, for the treatment of UTI. He denies any dysuria, or gross hematuria. He denies any significant acute on chronic leg swelling.   ED Course:  BP 121/71   Pulse 62   Temp (!) 97.5 F (36.4 C) (Oral)   Resp (!) 22   Wt 74.8 kg (165 lb)   SpO2 100%   BMI 27.46 kg/m   CT of the head show the 7 mm SDH arising from the faulx; CT neck neg. R elbow without fracture. Soft tissue swelling noted after fall, no dislocation   Eliquis was held  Received [facility] by Pharmacy for Eliquis reversal  Neurosurgery consulted, no plans for OR at this time   creatinine 1.97 glucose was initially 80, but he was NPO at the time.   troponin 0.1 white count 6.7 hemoglobin 14.2 platelets  98   Review of Systems:  As per HPI otherwise all other systems reviewed and are negative  Past Medical History:  Diagnosis Date  . BPH (benign prostatic hyperplasia)   . BPH (benign prostatic hypertrophy) 09/21/2014  .  Cellulitis in diabetic foot ([place]) 08/11/2016  . Chronic venous insufficiency   . CKD (chronic kidney disease)   . Dementia   . DM (diabetes mellitus), type 2 ([place])   . Enterococcus UTI   . Esophagitis   . GERD (gastroesophageal reflux disease)   . Hiatal hernia   . HTN (hypertension)   . Hyperlipidemia   . Kidney disease, chronic, stage III (GFR 30-59 ml/min) 09/21/2014  . Migraine   . Myeloproliferative disease ([place])   . NSTEMI (non-ST elevated myocardial infarction) ([place]) 09/19/2014  . OA (osteoarthritis)   . Physical deconditioning   . Polycythemia vera ([place]) 02/20/2011   JAK 2 mutation +   . Polycythemia vera(238.4) 02/20/2011  . Protein calorie malnutrition ([place])   . Sepsis ([place]) 08/11/2016  . Spinal stenosis, lumbar   . Thrombocytopenia ([place])   . Toxic encephalopathy   . Type 2 diabetes mellitus with renal manifestations ([place]) 09/19/2014  . Vertigo     Past Surgical History:  Procedure Laterality Date  . LOWER LEG SOFT TISSUE TUMOR EXCISION    . TOTAL HIP ARTHROPLASTY  1993   right    Social History Social History   Social History  . Marital status: Married    Spouse name: N/A  . Number of children: N/A  . Years of education: N/A   Occupational History  . retired Press photographer    Social History Main Topics  . Smoking status: Never  Smoker  . Smokeless tobacco: Never Used  . Alcohol use Yes     Comment: rarely  . Drug use: No  . Sexual activity: Not on file   Other Topics Concern  . Not on file   Social History Narrative   Admitted to [facility] 08/17/16    Married -Dee   Never smoked   Alcohol -rarely   DNR        Allergies  Allergen Reactions  . Penicillins Anaphylaxis, Hives and Swelling    Per MAR  Has patient had a PCN reaction causing immediate rash, facial/tongue/throat swelling, SOB or lightheadedness with hypotension: unknown Has patient had a PCN reaction causing severe rash involving mucus membranes or skin necrosis: unknown Has  patient had a PCN reaction that required hospitalization: unknown Has patient had a PCN reaction occurring within the last 10 years: unknown If all of the above answers are "NO", then may proceed with Cephalosporin use. Pts family is unable to answer  . Aspirin Other (See Comments)  . Cardura [Doxazosin]     Per MAR  . Ciprofloxacin Other (See Comments)    Per MAR Joint swelling  . Macrodantin [Nitrofurantoin Macrocrystal] Other (See Comments)    Chest pains  . Other     "lubriderm pain pill?"  . Oxycodone Other (See Comments)    Pt took OxyContin and tramadol together and almost "died"  . Sulfamethoxazole Other (See Comments)    Unknown - doesn't remember reaction  . Tramadol Other (See Comments)    Blank stare, no expression, becoming addictive  . Unisom [Doxylamine Succinate (Sleep)] Other (See Comments)    Hangover the next day  . Xanax [Alprazolam] Other (See Comments)    loopy    Family History  Problem Relation Age of Onset  . Diabetes Mother   . Hypertension Mother   . Cancer Father   . Diabetes Brother   . Kidney failure Brother       Prior to Admission medications   Medication Sig Start Date End Date Taking? Authorizing Provider  acetaminophen (TYLENOL) 325 MG tablet Take 650 mg by mouth every 4 (four) hours as needed for moderate pain.   Yes [provider]  acetaminophen (TYLENOL) 650 MG CR tablet Take 650 mg by mouth 3 (three) times daily.    Yes [provider]  apixaban (ELIQUIS) 2.5 MG TABS tablet Take 1 tablet (2.5 mg total) by mouth 2 (two) times daily. 08/17/16  Yes Charlynne Cousins, MD  atorvastatin (LIPITOR) 10 MG tablet Take 10 mg by mouth at bedtime.   Yes [provider]  docusate sodium (COLACE) 100 MG capsule Take 100 mg by mouth daily.   Yes [provider]  finasteride (PROSCAR) 5 MG tablet Take 5 mg by mouth at bedtime.  06/16/14  Yes [provider]  glipiZIDE (GLUCOTROL XL) 5 MG 24 hr tablet Take 5  mg by mouth daily.   Yes [provider]  isosorbide mononitrate (IMDUR) 60 MG 24 hr tablet Take 1 tablet (60 mg total) by mouth daily. 09/21/14  Yes Jacolyn Reedy, MD  lisinopril (PRINIVIL,ZESTRIL) 5 MG tablet Take 5 mg by mouth at bedtime.    Yes [provider]  metoprolol tartrate (LOPRESSOR) 50 MG tablet Take 25 mg by mouth 2 (two) times daily. 06/01/16  Yes [provider]  mirtazapine (REMERON) 15 MG tablet Take 15 mg by mouth at bedtime.   Yes [provider]  nitroGLYCERIN (NITROSTAT) 0.4  MG SL tablet Place 1 tablet (0.4 mg total) under the tongue every 5 (five) minutes x 3 doses as needed for chest pain. 09/21/14  Yes Jacolyn Reedy, MD  sulfamethoxazole-trimethoprim (BACTRIM DS,SEPTRA DS) 800-160 MG tablet Take 1 tablet by mouth 2 (two) times daily. 10/05/16 10/10/16 Yes [provider]  tamsulosin (FLOMAX) 0.4 MG CAPS capsule Take 0.4 mg by mouth 2 (two) times daily.    Yes [provider]  tolterodine (DETROL LA) 4 MG 24 hr capsule Take 4 mg by mouth at bedtime.   Yes [provider]  torsemide (DEMADEX) 10 MG tablet Take 10 mg by mouth daily.   Yes [provider]  NON FORMULARY Med Pass 2.0 120 mls twice daily with medication    [provider]    Physical Exam:  Vitals:   10/06/16 0931 10/06/16 0945 10/06/16 1115 10/06/16 1130  BP: 114/84 (!) 110/95 (!) 126/55 121/71  Pulse: 61 (!) 54 63 62  Resp: 10 17 17  (!) 22  Temp: (!) 97.5 F (36.4 C)     TempSrc: Oral     SpO2: 100% 100% 99% 100%  Weight: 74.8 kg (165 lb)      Constitutional: NAD, calm, comfortable   Eyes: PERRL, lids and conjunctivae normal ENMT: Mucous membranes are slightly dry, without exudate or lesions  Neck: normal, supple, no masses, no thyromegaly Respiratory: clear to auscultation bilaterally, no wheezing, no crackles. Normal respiratory effort  Cardiovascular:Irregularly irregular, soft 1/6  Murmur , rubs or gallops  Bilateral 2+extremity edema. 2+ pedal pulses. No carotid bruits.  Abdomen: Soft, non tender, No hepatosplenomegaly. Bowel sounds positive.  Musculoskeletal: no clubbing / cyanosis. R elbow tender but able to move  Skin: no jaundice, No lesions.  Neurologic: Sensation intact  Strength equal in all extremities Psychiatric:   Alert and oriented x 3. Normal mood.     Labs on Admission: I have personally reviewed following labs and imaging studies  CBC:  Recent Labs Lab 10/06/16 1056  WBC 6.7  NEUTROABS 4.6  HGB 14.2  HCT 42.3  MCV 89.1  PLT 98*    Basic Metabolic Panel:  Recent Labs Lab 10/06/16 1056  NA 141  K 4.6  CL 108  CO2 25  GLUCOSE 80  BUN 43*  CREATININE 1.97*  CALCIUM 9.4    GFR: Estimated Creatinine Clearance: 23.5 mL/min (A) (by C-G formula based on SCr of 1.97 mg/dL (H)).  Liver Function Tests:  Recent Labs Lab 10/06/16 1056  AST 28  ALT 17  ALKPHOS 53  BILITOT 0.9  PROT 6.2*  ALBUMIN 3.0*   No results for input(s): LIPASE, AMYLASE in the last 168 hours. No results for input(s): AMMONIA in the last 168 hours.  Coagulation Profile: No results for input(s): INR, PROTIME in the last 168 hours.  Cardiac Enzymes:  Recent Labs Lab 10/06/16 1056  TROPONINI 0.10*    BNP (last 3 results) No results for input(s): PROBNP in the last 8760 hours.  HbA1C: No results for input(s): HGBA1C in the last 72 hours.  CBG: No results for input(s): GLUCAP in the last 168 hours.  Lipid Profile: No results for input(s): CHOL, HDL, LDLCALC, TRIG, CHOLHDL, LDLDIRECT in the last 72 hours.  Thyroid Function Tests: No results for input(s): TSH, T4TOTAL, FREET4, T3FREE, THYROIDAB in the last 72 hours.  Anemia Panel: No results for input(s): VITAMINB12, FOLATE, FERRITIN, TIBC, IRON, RETICCTPCT in the last 72 hours.  Urine analysis:    Component Value Date/Time  COLORURINE YELLOW 08/12/2016 0428   APPEARANCEUR HAZY (A) 08/12/2016 0428   LABSPEC 1.013  08/12/2016 0428   PHURINE 5.0 08/12/2016 0428   GLUCOSEU NEGATIVE 08/12/2016 0428   HGBUR SMALL (A) 08/12/2016 0428   BILIRUBINUR NEGATIVE 08/12/2016 0428   KETONESUR NEGATIVE 08/12/2016 0428   PROTEINUR NEGATIVE 08/12/2016 0428   UROBILINOGEN 1.0 09/19/2014 1610   NITRITE NEGATIVE 08/12/2016 0428   LEUKOCYTESUR NEGATIVE 08/12/2016 0428    Sepsis Labs: @LABRCNTIP (procalcitonin:4,lacticidven:4) )No results found for this or any previous visit (from the past 240 hour(s)).   Radiological Exams on Admission: Dg Elbow 2 Views Right  Result Date: 10/06/2016 CLINICAL DATA:  Pain following fall EXAM: RIGHT ELBOW - 2 VIEW COMPARISON:  None. FINDINGS: Frontal and lateral views were obtained. There is soft tissue swelling. There is evidence of an avulsion along the lateral distal humeral condyle with displacement of the fracture fragment approximately 3.5 mm from the remainder of the humerus. The borders of this fragment are subtly irregular suggesting recent injury. No other evidence suggesting fracture. No dislocation. No joint effusion. There is no appreciable joint space narrowing or erosion. IMPRESSION: Recent appearing avulsion along the distal lateral humeral condyle. No other evidence of fracture. There is soft tissue swelling. No dislocation. Electronically Signed   By: Lowella Grip III M.D.   On: 10/06/2016 10:29   Ct Head Wo Contrast  Result Date: 10/06/2016 CLINICAL DATA:  Headache and neck pain following fall EXAM: CT HEAD WITHOUT CONTRAST CT CERVICAL SPINE WITHOUT CONTRAST TECHNIQUE: Multidetector CT imaging of the head and cervical spine was performed following the standard protocol without intravenous contrast. Multiplanar CT image reconstructions of the cervical spine were also generated. COMPARISON:  Head CT Jun 27, 2016 and August 11, 2016 FINDINGS: CT HEAD FINDINGS Brain: There is mild diffuse atrophy. There is somewhat more atrophy in the frontal lobes than elsewhere, stable.  There is a focal subdural hematoma in the falx measuring 7 mm in maximal thickness. There is no appreciable mass effect from this falcine subdural hematoma. No other extra-axial fluid collection evident. No intra-axial hemorrhage seen. There is no mass or midline shift. There is patchy small vessel disease in the centra semiovale bilaterally. No acute infarct evident. Vascular: No hyperdense vessels. There is mild calcification in each carotid siphon region. There is also calcification in the distal vertebral arteries. Skull: The bony calvarium appears intact. Sinuses/Orbits: There is opacification of multiple ethmoid air cells. Other paranasal sinuses are clear. There is nasal turbinate edema bilaterally. Orbits appear symmetric bilaterally. Other: Mastoid air cells are clear. CT CERVICAL SPINE FINDINGS Alignment: There is lowers cervical levoscoliosis. There is 2 mm of anterolisthesis of C5 on C6. There is just over 3 mm of anterolisthesis of C7 on T1. There is 2 mm of anterolisthesis of T1 on T2. Skull base and vertebrae: These skull base and craniocervical junction regions appear normal. There is no demonstrable fracture. There are no blastic or lytic bone lesions. Soft tissues and spinal canal: Prevertebral soft tissues and predental space regions are normal. No paraspinous lesions. No cord or canal hematoma evident. Disc levels: There is severe disc space narrowing at C6-7, C7-T1, and T1-2, as well as other visualized thoracic levels. There is moderate disc space narrowing at C5-6. There is multilevel facet hypertrophy. There is moderately severe exit foraminal narrowing on the left at C5-6 and C6-7. There is no frank disc extrusion or stenosis. Upper chest: Visualized upper lung regions appear unremarkable. Other: There are foci of calcification in each  carotid artery. IMPRESSION: CT head: Subdural hematoma arising in the falx with maximum thickness 7 mm. No mass effect. No other extra-axial fluid. No  intra-axial mass or hemorrhage. There is atrophy with patchy periventricular small vessel disease. There is no evident acute infarct. There are mild foci of arterial vascular calcification. There is opacification of multiple ethmoid air cells. There is nasal turbinate edema bilaterally. CT cervical spine: No fracture. Areas of mild spondylolisthesis are felt to be due to underlying spondylosis. There is extensive multilevel osteoarthritic change. No frank disc extrusion or stenosis. Foci of carotid artery calcification bilaterally. Critical Value/emergent results were called by telephone at the time of interpretation on 10/06/2016 at 10:57 am to Dr. Dolly Rias, ED physician, who verbally acknowledged these results. Electronically Signed   By: Lowella Grip III M.D.   On: 10/06/2016 10:58   Ct Cervical Spine Wo Contrast  Result Date: 10/06/2016 CLINICAL DATA:  Headache and neck pain following fall EXAM: CT HEAD WITHOUT CONTRAST CT CERVICAL SPINE WITHOUT CONTRAST TECHNIQUE: Multidetector CT imaging of the head and cervical spine was performed following the standard protocol without intravenous contrast. Multiplanar CT image reconstructions of the cervical spine were also generated. COMPARISON:  Head CT Jun 27, 2016 and August 11, 2016 FINDINGS: CT HEAD FINDINGS Brain: There is mild diffuse atrophy. There is somewhat more atrophy in the frontal lobes than elsewhere, stable. There is a focal subdural hematoma in the falx measuring 7 mm in maximal thickness. There is no appreciable mass effect from this falcine subdural hematoma. No other extra-axial fluid collection evident. No intra-axial hemorrhage seen. There is no mass or midline shift. There is patchy small vessel disease in the centra semiovale bilaterally. No acute infarct evident. Vascular: No hyperdense vessels. There is mild calcification in each carotid siphon region. There is also calcification in the distal vertebral arteries. Skull: The bony calvarium  appears intact. Sinuses/Orbits: There is opacification of multiple ethmoid air cells. Other paranasal sinuses are clear. There is nasal turbinate edema bilaterally. Orbits appear symmetric bilaterally. Other: Mastoid air cells are clear. CT CERVICAL SPINE FINDINGS Alignment: There is lowers cervical levoscoliosis. There is 2 mm of anterolisthesis of C5 on C6. There is just over 3 mm of anterolisthesis of C7 on T1. There is 2 mm of anterolisthesis of T1 on T2. Skull base and vertebrae: These skull base and craniocervical junction regions appear normal. There is no demonstrable fracture. There are no blastic or lytic bone lesions. Soft tissues and spinal canal: Prevertebral soft tissues and predental space regions are normal. No paraspinous lesions. No cord or canal hematoma evident. Disc levels: There is severe disc space narrowing at C6-7, C7-T1, and T1-2, as well as other visualized thoracic levels. There is moderate disc space narrowing at C5-6. There is multilevel facet hypertrophy. There is moderately severe exit foraminal narrowing on the left at C5-6 and C6-7. There is no frank disc extrusion or stenosis. Upper chest: Visualized upper lung regions appear unremarkable. Other: There are foci of calcification in each carotid artery. IMPRESSION: CT head: Subdural hematoma arising in the falx with maximum thickness 7 mm. No mass effect. No other extra-axial fluid. No intra-axial mass or hemorrhage. There is atrophy with patchy periventricular small vessel disease. There is no evident acute infarct. There are mild foci of arterial vascular calcification. There is opacification of multiple ethmoid air cells. There is nasal turbinate edema bilaterally. CT cervical spine: No fracture. Areas of mild spondylolisthesis are felt to be due to underlying spondylosis. There is extensive  multilevel osteoarthritic change. No frank disc extrusion or stenosis. Foci of carotid artery calcification bilaterally. Critical  Value/emergent results were called by telephone at the time of interpretation on 10/06/2016 at 10:57 am to Dr. Dolly Rias, ED physician, who verbally acknowledged these results. Electronically Signed   By: Lowella Grip III M.D.   On: 10/06/2016 10:58    EKG: Independently reviewed.  Assessment/Plan Active Problems:   Polycythemia vera (HCC)   NSTEMI (non-ST elevated myocardial infarction) ([place])   Type 2 diabetes mellitus with renal manifestations (HCC)   Hyperlipidemia   Kidney disease, chronic, stage III (GFR 30-59 ml/min)   Chronic venous insufficiency   Abnormal cardiovascular function study   Benign prostatic hyperplasia   GERD (gastroesophageal reflux disease)   Primary osteoarthritis of both knees   Hypertensive heart disease with congestive heart failure (HCC)   Chronic systolic CHF (congestive heart failure) (HCC)   Coronary artery disease   Syncope   Pre-Syncope in the setting of likely micturition fall . Labs, EKG unrevealing. Neuro exam unremarkable . Last 2 D echo 08/2016 with EF 40-45 mildly to mod reduced systolic. UA  Pending . Afebrile, VSS. Recent admission for sepsis, d/c on Bactrim  Syncope order set  SDU due to frequent neuro checks  2 D echo  EKG in am Blood and Urine cultures  Hold Beta blockers and other BP meds for now , Imdur  Hold sedatives and narcotics meds  Lactic acid  Continue bactrim to complete OP therapy   Atrial Fibrillation  on anticoagulation with Eliquis, held due to risk of bleeding   Rate controlled Monitor closely as Lopressor is on hold   Elevated Troponin The patient is asymptomatic.EKG unrevealing, patient chest pain free  Unclear etiology  cycle Tn to monitor for changes   Type II Diabetes Current blood sugar level is  Lab Results  Component Value Date   HGBA1C 7.4 (H) 09/19/2014   Hgb A1C Hold home oral diabetic medications.  SSI   Hypertension BP 121/71   Pulse 62   Temp (!) 97.5 F (36.4 C) (Oral)   Resp (!) 22    Wt 74.8 kg (165 lb)   SpO2 100%   BMI 27.46 kg/m  Controlled Continue home anti-hypertensive medications  Add Hydralazine Q6 hours as needed for BP 160/90   Hyperlipidemia Continue home statins  Acute on Chronic kidney disease stage  3   baseline creatinine  1.3  Current Cr 1.97  Lab Results  Component Value Date   CREATININE 1.97 (H) 10/06/2016   CREATININE 1.3 08/22/2016   CREATININE 1.33 (H) 08/17/2016  Hold diuretics  Repeat CMET in am  Polyycythemia Vera, JAK2+ mutation stable  Can follow as OP with PCP   Chronic Thrombocytopenia, current plt 98, in the setting of SDH  No transfusion is indicated at this time Monitor counts closely Transfuse 1 unit of platelets if count is less or equal than 10,000 or 20,000 if the patient is acutely bleeding Hold Eliquis-Kcentra x1 given for Eliquis reversal    Benign prostatic hypertrophy, no acute issues, no dysuria.  Continue meds tonight    GERD, no acute symptoms Continue PPI  DVT prophylaxis: SCD  Code Status:   Full      Family Communication:  Discussed with patient Disposition Plan: Expect patient to be discharged to home after condition improves Consults called:  Neurosurgery   Per EDP  Admission status: SDU    Rondel Jumbo, PA-C [facility]   10/06/2016, 12:59 PM

## 2016-10-07 ENCOUNTER — Inpatient Hospital Stay (HOSPITAL_COMMUNITY): Payer: Medicare Other

## 2016-10-07 DIAGNOSIS — N183 Chronic kidney disease, stage 3 (moderate): Secondary | ICD-10-CM

## 2016-10-07 DIAGNOSIS — W19XXXA Unspecified fall, initial encounter: Secondary | ICD-10-CM

## 2016-10-07 DIAGNOSIS — K210 Gastro-esophageal reflux disease with esophagitis: Secondary | ICD-10-CM

## 2016-10-07 DIAGNOSIS — M170 Bilateral primary osteoarthritis of knee: Secondary | ICD-10-CM

## 2016-10-07 DIAGNOSIS — I25118 Atherosclerotic heart disease of native coronary artery with other forms of angina pectoris: Secondary | ICD-10-CM

## 2016-10-07 DIAGNOSIS — E785 Hyperlipidemia, unspecified: Secondary | ICD-10-CM

## 2016-10-07 DIAGNOSIS — I110 Hypertensive heart disease with heart failure: Secondary | ICD-10-CM

## 2016-10-07 DIAGNOSIS — I6200 Nontraumatic subdural hemorrhage, unspecified: Secondary | ICD-10-CM

## 2016-10-07 DIAGNOSIS — I5022 Chronic systolic (congestive) heart failure: Secondary | ICD-10-CM

## 2016-10-07 DIAGNOSIS — D45 Polycythemia vera: Secondary | ICD-10-CM

## 2016-10-07 LAB — GLUCOSE, CAPILLARY
GLUCOSE-CAPILLARY: 79 mg/dL (ref 65–99)
GLUCOSE-CAPILLARY: 113 mg/dL — AB (ref 65–99)
GLUCOSE-CAPILLARY: 138 mg/dL — AB (ref 65–99)
Glucose-Capillary: 150 mg/dL — ABNORMAL HIGH (ref 65–99)
Glucose-Capillary: 120 mg/dL — ABNORMAL HIGH (ref 65–99)
Glucose-Capillary: 116 mg/dL — ABNORMAL HIGH (ref 65–99)

## 2016-10-07 LAB — CBC
HCT: 37.9 % — ABNORMAL LOW (ref 39.0–52.0)
Hemoglobin: 12.7 g/dL — ABNORMAL LOW (ref 13.0–17.0)
MCH: 29.6 pg (ref 26.0–34.0)
MCHC: 33.5 g/dL (ref 30.0–36.0)
MCV: 88.3 fL (ref 78.0–100.0)
PLATELETS: 107 10*3/uL — AB (ref 150–400)
RBC: 4.29 MIL/uL (ref 4.22–5.81)
RDW: 15.7 % — ABNORMAL HIGH (ref 11.5–15.5)
WBC: 7.2 10*3/uL (ref 4.0–10.5)

## 2016-10-07 LAB — COMPREHENSIVE METABOLIC PANEL
ALK PHOS: 53 U/L (ref 38–126)
ALT: 19 U/L (ref 17–63)
AST: 27 U/L (ref 15–41)
Albumin: 2.7 g/dL — ABNORMAL LOW (ref 3.5–5.0)
Anion gap: 7 (ref 5–15)
BUN: 40 mg/dL — ABNORMAL HIGH (ref 6–20)
CALCIUM: 8.7 mg/dL — AB (ref 8.9–10.3)
CO2: 23 mmol/L (ref 22–32)
CREATININE: 1.75 mg/dL — AB (ref 0.61–1.24)
Chloride: 107 mmol/L (ref 101–111)
GFR, EST AFRICAN AMERICAN: 38 mL/min — AB (ref >60)
GFR, EST NON AFRICAN AMERICAN: 33 mL/min — AB (ref >60)
Glucose, Bld: 123 mg/dL — ABNORMAL HIGH (ref 65–99)
Potassium: 4.7 mmol/L (ref 3.5–5.1)
Sodium: 137 mmol/L (ref 135–145)
TOTAL PROTEIN: 5.4 g/dL — AB (ref 6.5–8.1)
Total Bilirubin: 0.8 mg/dL (ref 0.3–1.2)

## 2016-10-07 LAB — PROTIME-INR
INR: 1.14
PROTHROMBIN TIME: 14.7 s (ref 11.4–15.2)

## 2016-10-07 LAB — TROPONIN I: Troponin I: 0.13 ng/mL (ref <0.03)

## 2016-10-07 MED ORDER — METOPROLOL TARTRATE 12.5 MG HALF TABLET
12.5000 mg | ORAL_TABLET | Freq: Two times a day (BID) | ORAL | Status: DC
  Administered 2016-10-07 – 2016-10-09 (×5): 12.5 mg via ORAL
  Filled 2016-10-07 (×5): qty 1

## 2016-10-07 MED ORDER — LEVETIRACETAM 500 MG PO TABS
500.0000 mg | ORAL_TABLET | Freq: Two times a day (BID) | ORAL | Status: DC
  Administered 2016-10-07 – 2016-10-09 (×4): 500 mg via ORAL
  Filled 2016-10-07 (×4): qty 1

## 2016-10-07 NOTE — Plan of Care (Signed)
Problem: Pain Managment: Goal: General experience of comfort will improve Outcome: Progressing Pt. has not experienced any pain thus far this hospital stay.

## 2016-10-07 NOTE — Progress Notes (Signed)
CRITICAL VALUE ALERT  Critical Value: Troponin 0.12  Date & Time Notied:  Was not notified  Provider Notified: Lynch, NP  Orders Received/Actions taken: No new orders at this time

## 2016-10-07 NOTE — ED Notes (Signed)
Patient kept belongings including 2 rings, a watch and his glasses.

## 2016-10-07 NOTE — Progress Notes (Signed)
Neurosurgery Progress Note  No issues overnight. No complaints this am  EXAM:  BP (!) 104/56   Pulse 66   Temp 98.1 F (36.7 C) (Tympanic)   Resp 18   Ht 5\' 4"  (1.626 m)   Wt 77.1 kg (170 lb)   SpO2 97%   BMI 29.18 kg/m   Awake, alert, oriented  Speech fluent, appropriate  CN grossly intact  MAEW  PLAN Repeat head Ct shows slight improvement in SDH. Cleared from NS perspective. Complete 7 day Keppra course for seizure prophylaxis No need for repeat imaging unless symptoms change F/U 4 weeks outpt for repeat CT. Hold eliquis until 4 week f/u CT

## 2016-10-07 NOTE — Evaluation (Signed)
Physical Therapy Evaluation Patient Details Name: Aaron Stephenson MRN: [id number] DOB: 04/27/1924 Today's Date: 10/07/2016   History of Present Illness  Patient is a 83 yo male admitted 10/06/16 from SNF after a fall backward, hitting head.  Patient with SDH - no surgery per NS.     PMH:  DM, HTN, Afib, HLD, CAD, NSTEMI, CKD, dementia  Clinical Impression  Patient presents with problems listed below.  Will benefit from acute PT to maximize functional mobility prior to discharge.  Recommend patient return to SNF at d/c for continued therapy.    Follow Up Recommendations SNF;Supervision for mobility/OOB    Equipment Recommendations  None recommended by PT    Recommendations for Other Services       Precautions / Restrictions Precautions Precautions: Fall Precaution Comments: Fall pta Restrictions Weight Bearing Restrictions: No      Mobility  Bed Mobility Overal bed mobility: Needs Assistance Bed Mobility: Supine to Sit     Supine to sit: Mod assist     General bed mobility comments: Verbal cues for technique.  Assist to bring LE's to EOB and raise trunk to sitting position.  Good balance once upright.  Transfers Overall transfer level: Needs assistance Equipment used: Rolling walker (2 wheeled) Transfers: Sit to/from [facility] Sit to Stand: Mod assist;+2 safety/equipment Stand pivot transfers: Min assist;+2 physical assistance;+2 safety/equipment       General transfer comment: Patient required assist to power up to stance.  Then able to take several shuffle steps to pivot to chair.  Assist to control descent into chair.  Ambulation/Gait             General Gait Details: NT  Stairs            Wheelchair Mobility    Modified Rankin (Stroke Patients Only) Modified Rankin (Stroke Patients Only) Pre-Morbid Rankin Score: Moderately severe disability Modified Rankin: Moderately severe disability     Balance Overall balance  assessment: Needs assistance;History of Falls         Standing balance support: Bilateral upper extremity supported Standing balance-Leahy Scale: Poor                               Pertinent Vitals/Pain Pain Assessment: Faces Faces Pain Scale: Hurts little more Pain Location: back and shoulders Pain Descriptors / Indicators: Aching;Sore;Dull Pain Intervention(s): Limited activity within patient's tolerance;Monitored during session;Repositioned    Home Living Family/patient expects to be discharged to:: Skilled nursing facility                 Additional Comments: Per chart and daughter, patient from SNF where he receives PT services.    Prior Function Level of Independence: Needs assistance   Gait / Transfers Assistance Needed: Assist with ambulation with use of RW  ADL's / Homemaking Assistance Needed: Assist with ADL's        Hand Dominance        Extremity/Trunk Assessment   Upper Extremity Assessment Upper Extremity Assessment: Defer to OT evaluation    Lower Extremity Assessment Lower Extremity Assessment: Generalized weakness    Cervical / Trunk Assessment Cervical / Trunk Assessment: Kyphotic  Communication   Communication: HOH  Cognition Arousal/Alertness: Awake/alert Behavior During Therapy: WFL for tasks assessed/performed Overall Cognitive Status: No family/caregiver present to determine baseline cognitive functioning  General Comments: Oriented x2.  Able to follow commands.  Decreased memory or carryover of instructions.      General Comments      Exercises     Assessment/Plan    PT Assessment Patient needs continued PT services  PT Problem List Decreased strength;Decreased activity tolerance;Decreased balance;Decreased mobility;Decreased cognition;Decreased knowledge of use of DME;Decreased safety awareness;Pain       PT Treatment Interventions DME instruction;Gait  training;Functional mobility training;Therapeutic activities;Therapeutic exercise;Cognitive remediation;Patient/family education    PT Goals (Current goals can be found in the Care Plan section)  Acute Rehab PT Goals Patient Stated Goal: Return to Rehab facility PT Goal Formulation: With patient Time For Goal Achievement: 10/21/16 Potential to Achieve Goals: Good    Frequency Min 2X/week   Barriers to discharge        Co-evaluation               AM-PAC PT "6 Clicks" Daily Activity  Outcome Measure Difficulty turning over in bed (including adjusting bedclothes, sheets and blankets)?: Unable Difficulty moving from lying on back to sitting on the side of the bed? : Unable Difficulty sitting down on and standing up from a chair with arms (e.g., wheelchair, bedside commode, etc,.)?: Unable Help needed moving to and from a bed to chair (including a wheelchair)?: A Little Help needed walking in hospital room?: A Lot Help needed climbing 3-5 steps with a railing? : Total 6 Click Score: 9    End of Session Equipment Utilized During Treatment: Gait belt Activity Tolerance: Patient tolerated treatment well;Patient limited by fatigue Patient left: in chair;with call bell/phone within reach Nurse Communication: Mobility status (In chair) PT Visit Diagnosis: Unsteadiness on feet (R26.81);Other abnormalities of gait and mobility (R26.89);Muscle weakness (generalized) (M62.81);History of falling (Z91.81);Pain Pain - part of body:  (back and neck)    Time: 4315-4008 PT Time Calculation (min) (ACUTE ONLY): 20 min   Charges:   PT Evaluation $PT Eval Moderate Complexity: 1 Mod     PT G Codes:        Carita Pian. Sanjuana Kava, [facility] Acute Rehab Services Pager [place] 10/07/2016, 5:33 PM

## 2016-10-07 NOTE — Progress Notes (Addendum)
PROGRESS NOTE  Aaron Stephenson  JXB:[id number] DOB: [birth year] DOA: 10/06/2016 PCP: System, Pcp Not In  Outpatient Specialists: Cardiology, Wynonia Lawman Brief Narrative: Aaron Stephenson is a 83 y.o. male with a history of dementia, T2DM, AFib on eliquis,HLD, CAD, and recent Salmonella sepsis who presented to the ED 8/24 after a fall at [facility]. He reports arising from bed, walking to the bathroom, preparing to urinate, and suddenly feeling lightheaded when his right knee gave out and he fell backward hitting his head without LOC. On arrival, neuro exam was nonfocal. CT head demonstrated a 7 mm subdural hematoma arising from the falx. 1 dose of K-centra was given to reverse eliquis and the patient was admitted for neuro observation and work up of syncope. Repeat CT scan showed improvement in SDH and neurosurgery signed off, recommending continuing keppra for seizure prophylaxis, and holding eliquis at least until repeat CT in 4 weeks.   Assessment & Plan: Active Problems:   Polycythemia vera (HCC)   NSTEMI (non-ST elevated myocardial infarction) (HCC)   Type 2 diabetes mellitus with renal manifestations (HCC)   Hyperlipidemia   Kidney disease, chronic, stage III (GFR 30-59 ml/min)   Chronic venous insufficiency   Abnormal cardiovascular function study   Benign prostatic hyperplasia   GERD (gastroesophageal reflux disease)   Primary osteoarthritis of both knees   Hypertensive heart disease with congestive heart failure (HCC)   Chronic systolic CHF (congestive heart failure) ([place])   Coronary artery disease   Syncope  Fall: Multifactorial including micturition syncope, mild weakness/debility (receiving HH-PT), significant osteoarthritis, and relative hypotension (recently started torsemide). Labs, EKG unrevealing. Neuro exam unremarkable. Last echo 08/2016 EF40-45%. Afebrile, VSS. Recent admission for sepsis, has been on bactrim for UTI.  - Ok to deescalate neuro checks -  Echocardiogram ordered by admitting team. In light of mild troponin elevation, this is not unreasonable.  - Monitor blood cultures. Urine culture with 40k GNRs.  - Hold imdur, lisinopril, keep metoprolol at decreased dose to avoid tachycardia/PAF. Ok to continue torsemide for now.  - Minimize sedating medications.  - PT evaluation pending  Subdural hematoma: Intrahemispheric, shows evidence of resolution on repeat CT 8/25.  - Continue keppra ppx x7 days per neurosurgery, who recommends no further interventions - Holding eliquis for at least 4 weeks pending repeat CT head as outpatient.   Paroxysmal atrial fibrillation: Recent Dx in setting of sepsis. Currently NSR.  - Decrease metoprolol to 12.5mg  BID as above - Holding eliquis. Would personally recommend never restarting this due to fall risk and now SDH.   Elevated troponin: Mild, flat trend, no chest pain, nonischemic ECG.  - Checking echo, no further evaluation planned unless new wall motion abnormalities noted.   T2DM: Controlled at goal for his age with recent HbA1c 7.4%.  - Hold home medications - SSI while inpatient.   Essential HTN: Chronic, has been hyPOtensive thus far.  - Holding antihypertensives as above, continue monitoring.   Hyperlipidemia: Chronic, stable.  - Continue home statin, would recommend discontinuing statin due to age and concurrent dementia.   Chronic systolic CHF: Currently compensated.  - Medications as above.   Acute kidney injury on CKD stage III: Baseline Cr ~1.3, was 1.97 on admission. Recently started torsemide, and BUN/Cr appears prerenal consistent with dehydration, possibly contributing to fall.  - Hold lisinopril, will hold diuretic (was given today), may DC on reduced dose. - Monitor BMP   Polycythemia vera: +JAK mutation. Chronic, stable, currently with normal hgb.  - Outpatient follow  up.   Thrombocytopenia: Chronic, stable.  - SDH is stable, no transfusion indicated.  History  of prostate CA:  - Continue finasteride, flomax, tolterodine  GERD: Chronic, stable.  - Continue PPI  Osteoarthritis: Diffuse, significant in knees.  - Continue orthopedic treatments as outpatient  DVT prophylaxis: SCDs Code Status: DNR Family Communication: Daughters at bedside Disposition Plan: Transfer to floor, DC in next 24 hrs if stable.   Consultants:   Neurosurgery  Procedures:   None  Antimicrobials:  Bactrim   Subjective: Feels well, eating well. No lightheadedness.   Objective: Vitals:   10/07/16 0740 10/07/16 0800 10/07/16 1000 10/07/16 1136  BP: (!) 99/52 (!) 104/56 (!) 98/57 (!) 101/54  Pulse: 63 66 62 (!) 59  Resp: 16 18 18 16   Temp: 98.1 F (36.7 C)     TempSrc: Tympanic     SpO2: 96% 97% 99% 99%  Weight:      Height:       General exam: 83 y.o. male in no distress Respiratory system: Non-labored breathing room air. Clear to auscultation bilaterally.  Cardiovascular system: Regular rate and rhythm. No murmur, rub, or gallop. No JVD, and no pedal edema. Gastrointestinal system: Abdomen soft, non-tender, non-distended, with normoactive bowel sounds. No organomegaly or masses felt. Central nervous system: Alert, oriented to person, place. No focal neurological deficits. Extremities: Warm, no deformities Skin: No rashes, lesions no ulcers Psychiatry: Judgement and insight appear impaired due to cognitive impairment. Poor short term recall. Mood & affect appropriate.   Data Reviewed: I have personally reviewed following labs and imaging studies  CBC:  Recent Labs Lab 10/06/16 1056 10/07/16 0210  WBC 6.7 7.2  NEUTROABS 4.6  --   HGB 14.2 12.7*  HCT 42.3 37.9*  MCV 89.1 88.3  PLT 98* 401*   Basic Metabolic Panel:  Recent Labs Lab 10/06/16 1056 10/07/16 0210  NA 141 137  K 4.6 4.7  CL 108 107  CO2 25 23  GLUCOSE 80 123*  BUN 43* 40*  CREATININE 1.97* 1.75*  CALCIUM 9.4 8.7*   GFR: Estimated Creatinine Clearance: 26.3 mL/min  (A) (by C-G formula based on SCr of 1.75 mg/dL (H)). Liver Function Tests:  Recent Labs Lab 10/06/16 1056 10/07/16 0210  AST 28 27  ALT 17 19  ALKPHOS 53 53  BILITOT 0.9 0.8  PROT 6.2* 5.4*  ALBUMIN 3.0* 2.7*   No results for input(s): LIPASE, AMYLASE in the last 168 hours. No results for input(s): AMMONIA in the last 168 hours. Coagulation Profile:  Recent Labs Lab 10/06/16 1056 10/07/16 0210  INR 1.18 1.14   Cardiac Enzymes:  Recent Labs Lab 10/06/16 1056 10/06/16 1429 10/06/16 2215 10/07/16 0210  TROPONINI 0.10* 0.12* 0.12* 0.13*   BNP (last 3 results) No results for input(s): PROBNP in the last 8760 hours. HbA1C:  Recent Labs  10/06/16 1429  HGBA1C 5.2   CBG:  Recent Labs Lab 10/06/16 1737 10/06/16 2042 10/07/16 0743 10/07/16 1138  GLUCAP 170* 148* 79 113*   Lipid Profile: No results for input(s): CHOL, HDL, LDLCALC, TRIG, CHOLHDL, LDLDIRECT in the last 72 hours. Thyroid Function Tests: No results for input(s): TSH, T4TOTAL, FREET4, T3FREE, THYROIDAB in the last 72 hours. Anemia Panel: No results for input(s): VITAMINB12, FOLATE, FERRITIN, TIBC, IRON, RETICCTPCT in the last 72 hours. Urine analysis:    Component Value Date/Time   COLORURINE YELLOW 10/06/2016 1509   APPEARANCEUR HAZY (A) 10/06/2016 1509   LABSPEC 1.014 10/06/2016 1509   PHURINE 5.0 10/06/2016 1509  GLUCOSEU NEGATIVE 10/06/2016 [place] 10/06/2016 1509   [place] 10/06/2016 1509   [place] 10/06/2016 1509   PROTEINUR NEGATIVE 10/06/2016 1509   UROBILINOGEN 1.0 09/19/2014 1610   NITRITE NEGATIVE 10/06/2016 1509   LEUKOCYTESUR LARGE (A) 10/06/2016 1509   Recent Results (from the past 240 hour(s))  Culture, blood (Routine X 2) w Reflex to ID Panel     Status: None (Preliminary result)   Collection Time: 10/06/16  2:20 PM  Result Value Ref Range Status   Specimen Description BLOOD RIGHT FOREARM  Final   Special Requests   Final     BOTTLES DRAWN AEROBIC AND ANAEROBIC Blood Culture adequate volume   Culture NO GROWTH < 24 HOURS  Final   Report Status PENDING  Incomplete  Culture, blood (Routine X 2) w Reflex to ID Panel     Status: None (Preliminary result)   Collection Time: 10/06/16  2:33 PM  Result Value Ref Range Status   Specimen Description BLOOD RIGHT WRIST  Final   Special Requests IN PEDIATRIC BOTTLE Blood Culture adequate volume  Final   Culture NO GROWTH < 24 HOURS  Final   Report Status PENDING  Incomplete  Urine culture     Status: Abnormal (Preliminary result)   Collection Time: 10/06/16  3:09 PM  Result Value Ref Range Status   Specimen Description URINE, CLEAN CATCH  Final   Special Requests Normal  Final   Culture 40,000 COLONIES/mL GRAM NEGATIVE RODS (A)  Final   Report Status PENDING  Incomplete  MRSA PCR Screening     Status: None   Collection Time: 10/06/16  5:05 PM  Result Value Ref Range Status   MRSA by PCR NEGATIVE NEGATIVE Final    Comment:        The GeneXpert MRSA Assay (FDA approved for NASAL specimens only), is one component of a comprehensive MRSA colonization surveillance program. It is not intended to diagnose MRSA infection nor to guide or monitor treatment for MRSA infections.       Radiology Studies: X-ray Chest Pa And Lateral  Result Date: 10/06/2016 CLINICAL DATA:  83 year old male with a history of weakness EXAM: CHEST  2 VIEW COMPARISON:  08/14/2016, 08/12/2016, abdominal CT 08/13/2016 FINDINGS: Cardiomediastinal silhouette unchanged with persisting cardiomegaly. Calcifications of the aortic arch. Low lung volumes, similar to prior. Mixed interstitial and airspace opacity at the right lung base, increased from the comparison plain film. There does not appear to be a correlate on the abdominal CT of 08/13/2016. Opacity at the left base obscuring the left hemidiaphragm with blunting on the costophrenic sulcus lateral view. Degenerative changes of the spine. Degenerative  changes of the shoulders. No displaced fracture. IMPRESSION: Small bilateral pleural effusions, larger on the left. Mixed interstitial and airspace opacity at the right lung base, which appears new from the abdominal CT dated 08/13/2016. Leading differential includes atelectasis/ scarring and/or pneumonitis/pneumonia. Electronically Signed   By: Corrie Mckusick D.O.   On: 10/06/2016 13:41   Dg Elbow 2 Views Right  Result Date: 10/06/2016 CLINICAL DATA:  Pain following fall EXAM: RIGHT ELBOW - 2 VIEW COMPARISON:  None. FINDINGS: Frontal and lateral views were obtained. There is soft tissue swelling. There is evidence of an avulsion along the lateral distal humeral condyle with displacement of the fracture fragment approximately 3.5 mm from the remainder of the humerus. The borders of this fragment are subtly irregular suggesting recent injury. No other evidence suggesting fracture. No dislocation. No joint effusion. There  is no appreciable joint space narrowing or erosion. IMPRESSION: Recent appearing avulsion along the distal lateral humeral condyle. No other evidence of fracture. There is soft tissue swelling. No dislocation. Electronically Signed   By: Lowella Grip III M.D.   On: 10/06/2016 10:29   Ct Head Wo Contrast  Result Date: 10/07/2016 CLINICAL DATA:  Follow-up scan, recent fall. EXAM: CT HEAD WITHOUT CONTRAST TECHNIQUE: Contiguous axial images were obtained from the base of the skull through the vertex without intravenous contrast. COMPARISON:  10/06/2016. FINDINGS: Brain: Decreased hyperattenuation, and diminishing thickness, of the interhemispheric subdural hematoma. Minimal residual hematoma greatest posteriorly. Slight prominence of the extracerebral CSF spaces representing either hygroma or atrophy, most notable RIGHT frontal parasagittal region anteriorly. Stable atrophy with chronic microvascular ischemic change. Vascular: Calcification of the cavernous internal carotid arteries  consistent with cerebrovascular atherosclerotic disease. No signs of intracranial large vessel occlusion. Skull: Intact. Sinuses/Orbits: Minor mucosal thickening in the ethmoids. No evidence for basilar skull fracture. Other: Unremarkable. IMPRESSION: Improving close head injury. Resolving interhemispheric subdural hematoma. No new findings of significance. Electronically Signed   By: Staci Righter M.D.   On: 10/07/2016 09:12   Ct Head Wo Contrast  Result Date: 10/06/2016 CLINICAL DATA:  Headache and neck pain following fall EXAM: CT HEAD WITHOUT CONTRAST CT CERVICAL SPINE WITHOUT CONTRAST TECHNIQUE: Multidetector CT imaging of the head and cervical spine was performed following the standard protocol without intravenous contrast. Multiplanar CT image reconstructions of the cervical spine were also generated. COMPARISON:  Head CT Jun 27, 2016 and August 11, 2016 FINDINGS: CT HEAD FINDINGS Brain: There is mild diffuse atrophy. There is somewhat more atrophy in the frontal lobes than elsewhere, stable. There is a focal subdural hematoma in the falx measuring 7 mm in maximal thickness. There is no appreciable mass effect from this falcine subdural hematoma. No other extra-axial fluid collection evident. No intra-axial hemorrhage seen. There is no mass or midline shift. There is patchy small vessel disease in the centra semiovale bilaterally. No acute infarct evident. Vascular: No hyperdense vessels. There is mild calcification in each carotid siphon region. There is also calcification in the distal vertebral arteries. Skull: The bony calvarium appears intact. Sinuses/Orbits: There is opacification of multiple ethmoid air cells. Other paranasal sinuses are clear. There is nasal turbinate edema bilaterally. Orbits appear symmetric bilaterally. Other: Mastoid air cells are clear. CT CERVICAL SPINE FINDINGS Alignment: There is lowers cervical levoscoliosis. There is 2 mm of anterolisthesis of C5 on C6. There is just over  3 mm of anterolisthesis of C7 on T1. There is 2 mm of anterolisthesis of T1 on T2. Skull base and vertebrae: These skull base and craniocervical junction regions appear normal. There is no demonstrable fracture. There are no blastic or lytic bone lesions. Soft tissues and spinal canal: Prevertebral soft tissues and predental space regions are normal. No paraspinous lesions. No cord or canal hematoma evident. Disc levels: There is severe disc space narrowing at C6-7, C7-T1, and T1-2, as well as other visualized thoracic levels. There is moderate disc space narrowing at C5-6. There is multilevel facet hypertrophy. There is moderately severe exit foraminal narrowing on the left at C5-6 and C6-7. There is no frank disc extrusion or stenosis. Upper chest: Visualized upper lung regions appear unremarkable. Other: There are foci of calcification in each carotid artery. IMPRESSION: CT head: Subdural hematoma arising in the falx with maximum thickness 7 mm. No mass effect. No other extra-axial fluid. No intra-axial mass or hemorrhage. There is atrophy with  patchy periventricular small vessel disease. There is no evident acute infarct. There are mild foci of arterial vascular calcification. There is opacification of multiple ethmoid air cells. There is nasal turbinate edema bilaterally. CT cervical spine: No fracture. Areas of mild spondylolisthesis are felt to be due to underlying spondylosis. There is extensive multilevel osteoarthritic change. No frank disc extrusion or stenosis. Foci of carotid artery calcification bilaterally. Critical Value/emergent results were called by telephone at the time of interpretation on 10/06/2016 at 10:57 am to Dr. Dolly Rias, ED physician, who verbally acknowledged these results. Electronically Signed   By: Lowella Grip III M.D.   On: 10/06/2016 10:58   Ct Cervical Spine Wo Contrast  Result Date: 10/06/2016 CLINICAL DATA:  Headache and neck pain following fall EXAM: CT HEAD WITHOUT  CONTRAST CT CERVICAL SPINE WITHOUT CONTRAST TECHNIQUE: Multidetector CT imaging of the head and cervical spine was performed following the standard protocol without intravenous contrast. Multiplanar CT image reconstructions of the cervical spine were also generated. COMPARISON:  Head CT Jun 27, 2016 and August 11, 2016 FINDINGS: CT HEAD FINDINGS Brain: There is mild diffuse atrophy. There is somewhat more atrophy in the frontal lobes than elsewhere, stable. There is a focal subdural hematoma in the falx measuring 7 mm in maximal thickness. There is no appreciable mass effect from this falcine subdural hematoma. No other extra-axial fluid collection evident. No intra-axial hemorrhage seen. There is no mass or midline shift. There is patchy small vessel disease in the centra semiovale bilaterally. No acute infarct evident. Vascular: No hyperdense vessels. There is mild calcification in each carotid siphon region. There is also calcification in the distal vertebral arteries. Skull: The bony calvarium appears intact. Sinuses/Orbits: There is opacification of multiple ethmoid air cells. Other paranasal sinuses are clear. There is nasal turbinate edema bilaterally. Orbits appear symmetric bilaterally. Other: Mastoid air cells are clear. CT CERVICAL SPINE FINDINGS Alignment: There is lowers cervical levoscoliosis. There is 2 mm of anterolisthesis of C5 on C6. There is just over 3 mm of anterolisthesis of C7 on T1. There is 2 mm of anterolisthesis of T1 on T2. Skull base and vertebrae: These skull base and craniocervical junction regions appear normal. There is no demonstrable fracture. There are no blastic or lytic bone lesions. Soft tissues and spinal canal: Prevertebral soft tissues and predental space regions are normal. No paraspinous lesions. No cord or canal hematoma evident. Disc levels: There is severe disc space narrowing at C6-7, C7-T1, and T1-2, as well as other visualized thoracic levels. There is moderate disc  space narrowing at C5-6. There is multilevel facet hypertrophy. There is moderately severe exit foraminal narrowing on the left at C5-6 and C6-7. There is no frank disc extrusion or stenosis. Upper chest: Visualized upper lung regions appear unremarkable. Other: There are foci of calcification in each carotid artery. IMPRESSION: CT head: Subdural hematoma arising in the falx with maximum thickness 7 mm. No mass effect. No other extra-axial fluid. No intra-axial mass or hemorrhage. There is atrophy with patchy periventricular small vessel disease. There is no evident acute infarct. There are mild foci of arterial vascular calcification. There is opacification of multiple ethmoid air cells. There is nasal turbinate edema bilaterally. CT cervical spine: No fracture. Areas of mild spondylolisthesis are felt to be due to underlying spondylosis. There is extensive multilevel osteoarthritic change. No frank disc extrusion or stenosis. Foci of carotid artery calcification bilaterally. Critical Value/emergent results were called by telephone at the time of interpretation on 10/06/2016 at 10:57 am  to Dr. Dolly Rias, ED physician, who verbally acknowledged these results. Electronically Signed   By: Lowella Grip III M.D.   On: 10/06/2016 10:58    Scheduled Meds: . atorvastatin  10 mg Oral QHS  . docusate sodium  100 mg Oral Daily  . fesoterodine  4 mg Oral Daily  . finasteride  5 mg Oral QHS  . insulin aspart  0-9 Units Subcutaneous TID WC  . mouth rinse  15 mL Mouth Rinse BID  . metoprolol tartrate  12.5 mg Oral BID  . mirtazapine  15 mg Oral QHS  . sodium chloride flush  3 mL Intravenous Q12H  . sulfamethoxazole-trimethoprim  1 tablet Oral Daily  . tamsulosin  0.4 mg Oral BID  . torsemide  10 mg Oral Daily   Continuous Infusions: . levETIRAcetam Stopped (10/07/16 0346)     LOS: 1 day   Time spent: 25 minutes.  Vance Gather, MD [facility] Pager [phone]  If 7PM-7AM, please contact  night-coverage [email or web address] Password [email or web address] 10/07/2016, 1:27 PM

## 2016-10-07 NOTE — Progress Notes (Signed)
Pt transferred from 7M, S/P fall, alert and oriented, follows command accordingly, pt settled in bed with call light at bedside, pt on a low bed, tele monitor put and verified on pt, was reassured and will continue to monitor, v/s stable. [place]-As[place], Dietra Stokely Efe

## 2016-10-08 ENCOUNTER — Inpatient Hospital Stay (HOSPITAL_COMMUNITY): Payer: Medicare Other

## 2016-10-08 DIAGNOSIS — E1121 Type 2 diabetes mellitus with diabetic nephropathy: Secondary | ICD-10-CM

## 2016-10-08 DIAGNOSIS — R55 Syncope and collapse: Secondary | ICD-10-CM

## 2016-10-08 DIAGNOSIS — I872 Venous insufficiency (chronic) (peripheral): Secondary | ICD-10-CM

## 2016-10-08 DIAGNOSIS — I340 Nonrheumatic mitral (valve) insufficiency: Secondary | ICD-10-CM

## 2016-10-08 LAB — BASIC METABOLIC PANEL
ANION GAP: 5 (ref 5–15)
BUN: 34 mg/dL — AB (ref 6–20)
CHLORIDE: 106 mmol/L (ref 101–111)
CO2: 24 mmol/L (ref 22–32)
Calcium: 8.8 mg/dL — ABNORMAL LOW (ref 8.9–10.3)
Creatinine, Ser: 1.69 mg/dL — ABNORMAL HIGH (ref 0.61–1.24)
GFR calc Af Amer: 39 mL/min — ABNORMAL LOW (ref >60)
GFR calc non Af Amer: 34 mL/min — ABNORMAL LOW (ref >60)
GLUCOSE: 185 mg/dL — AB (ref 65–99)
POTASSIUM: 4.7 mmol/L (ref 3.5–5.1)
Sodium: 135 mmol/L (ref 135–145)

## 2016-10-08 LAB — URINE CULTURE: Special Requests: NORMAL

## 2016-10-08 LAB — ECHOCARDIOGRAM COMPLETE
HEIGHTINCHES: 64 in
WEIGHTICAEL: 2720 [oz_av]

## 2016-10-08 LAB — GLUCOSE, CAPILLARY
GLUCOSE-CAPILLARY: 164 mg/dL — AB (ref 65–99)
Glucose-Capillary: 116 mg/dL — ABNORMAL HIGH (ref 65–99)
Glucose-Capillary: 136 mg/dL — ABNORMAL HIGH (ref 65–99)

## 2016-10-08 MED ORDER — TORSEMIDE 20 MG PO TABS
10.0000 mg | ORAL_TABLET | Freq: Every day | ORAL | Status: DC
  Administered 2016-10-08 – 2016-10-09 (×2): 10 mg via ORAL
  Filled 2016-10-08 (×2): qty 1

## 2016-10-08 NOTE — NC FL2 (Signed)
[place] LEVEL OF CARE SCREENING TOOL     IDENTIFICATION  Patient Name: Aaron Stephenson Birthdate: [birth year] Sex: male Admission Date (Current Location): 10/06/2016  [facility] and [place] Number:  Herbalist and Address:  [facility] [place], [place] [address] [place], [place] [address]      Provider Number: [id number]  Attending Physician Name and Address:  Patrecia Pour, MD  Relative Name and Phone Number:       Current Level of Care: Hospital Recommended Level of Care: [place] Prior Approval Number:    Date Approved/Denied:   PASRR Number:    Discharge Plan: SNF    Current Diagnoses: Patient Active Problem List   Diagnosis Date Noted  . Syncope 10/06/2016  . Cellulitis 09/04/2016  . New onset a-fib ([place]) 08/19/2016  . Hypertensive heart disease with congestive heart failure ([place]) 08/19/2016  . Chronic systolic CHF (congestive heart failure) ([place]) 08/19/2016  . Coronary artery disease 08/19/2016  . Pressure injury of skin 08/16/2016  . Salmonella bacteremia 08/16/2016  . Sepsis ([place]) 08/11/2016  . Cellulitis in diabetic foot ([place]) 08/11/2016  . HCAP (healthcare-associated pneumonia) 08/11/2016  . Bilateral knee effusions 05/01/2016  . Primary osteoarthritis of both knees 05/01/2016  . GERD (gastroesophageal reflux disease) 07/26/2015  . Toxic encephalopathy 07/21/2015  . Kidney disease, chronic, stage III (GFR 30-59 ml/min) 09/21/2014  . Abnormal cardiovascular function study 09/21/2014  . Benign prostatic hyperplasia 09/21/2014  . Hyperlipidemia   . Chronic venous insufficiency   . NSTEMI (non-ST elevated myocardial infarction) ([place]) 09/19/2014  . Type 2 diabetes mellitus with renal manifestations ([place]) 09/19/2014  . Polycythemia vera ([place]) 02/20/2011    Orientation RESPIRATION BLADDER Height & Weight     Self, Place  Normal Incontinent Weight: 170 lb (77.1 kg) Height:  5\' 4"  (162.6 cm)   BEHAVIORAL SYMPTOMS/MOOD NEUROLOGICAL BOWEL NUTRITION STATUS      Incontinent  (Please see d/c summary)  AMBULATORY STATUS COMMUNICATION OF NEEDS Skin   Extensive Assist Verbally Normal                       Personal Care Assistance Level of Assistance  Bathing, Feeding, Dressing Bathing Assistance: Maximum assistance Feeding assistance: Limited assistance Dressing Assistance: Maximum assistance     Functional Limitations Info  Sight, Hearing, Speech Sight Info: Adequate Hearing Info: Adequate Speech Info: Adequate    SPECIAL CARE FACTORS FREQUENCY  PT (By licensed PT), OT (By licensed OT)     PT Frequency: 2x week OT Frequency: 2x week            Contractures Contractures Info: Not present    Additional Factors Info  Code Status, Allergies Code Status Info: DNR Allergies Info: Penicillins, Aspirin, Cardura Doxazosin, Ciprofloxacin, Macrodantin Nitrofurantoin Macrocrystal, Other, Oxycodone, Sulfamethoxazole, Tramadol, Unisom Doxylamine Succinate (Sleep), Xanax Alprazolam           Current Medications (10/08/2016):  This is the current hospital active medication list Current Facility-Administered Medications  Medication Dose Route Frequency Provider Last Rate Last Dose  . acetaminophen (TYLENOL) tablet 650 mg  650 mg Oral Q6H PRN Rondel Jumbo, PA-C       Or  . acetaminophen (TYLENOL) suppository 650 mg  650 mg Rectal Q6H PRN Rondel Jumbo, PA-C      . atorvastatin (LIPITOR) tablet 10 mg  10 mg Oral QHS Rondel Jumbo, PA-C   10 mg at 10/07/16 2153  . docusate sodium (COLACE)  capsule 100 mg  100 mg Oral Daily Rondel Jumbo, PA-C   100 mg at 10/08/16 1015  . fesoterodine (TOVIAZ) tablet 4 mg  4 mg Oral Daily Rondel Jumbo, PA-C   4 mg at 10/08/16 1014  . finasteride (PROSCAR) tablet 5 mg  5 mg Oral QHS Rondel Jumbo, PA-C   5 mg at 10/07/16 2154  . insulin aspart (novoLOG) injection 0-9 Units  0-9 Units Subcutaneous TID WC Rondel Jumbo, PA-C   1  Units at 10/07/16 1905  . levETIRAcetam (KEPPRA) tablet 500 mg  500 mg Oral BID Patrecia Pour, MD   500 mg at 10/08/16 1014  . MEDLINE mouth rinse  15 mL Mouth Rinse BID Lady Deutscher, MD   15 mL at 10/08/16 1015  . metoprolol tartrate (LOPRESSOR) tablet 12.5 mg  12.5 mg Oral BID Vance Gather B, MD   12.5 mg at 10/08/16 1015  . mirtazapine (REMERON) tablet 15 mg  15 mg Oral QHS Rondel Jumbo, PA-C   15 mg at 10/07/16 2153  . ondansetron (ZOFRAN) tablet 4 mg  4 mg Oral Q6H PRN Rondel Jumbo, PA-C       Or  . ondansetron [facility]) injection 4 mg  4 mg Intravenous Q6H PRN Sharene Butters E, PA-C      . sodium chloride flush (NS) 0.9 % injection 3 mL  3 mL Intravenous Q12H Wertman, Sara E, PA-C   3 mL at 10/08/16 1017  . sulfamethoxazole-trimethoprim (BACTRIM DS,SEPTRA DS) 800-160 MG per tablet 1 tablet  1 tablet Oral Daily Rondel Jumbo, PA-C   1 tablet at 10/08/16 1015  . tamsulosin (FLOMAX) capsule 0.4 mg  0.4 mg Oral BID Rondel Jumbo, PA-C   0.4 mg at 10/08/16 1015     Discharge Medications: Please see discharge summary for a list of discharge medications.  Relevant Imaging Results:  Relevant Lab Results:   Additional Information SSN: [id number]  Eileen Stanford, LCSW

## 2016-10-08 NOTE — Progress Notes (Signed)
PROGRESS NOTE  Aaron Stephenson  PNT:[id number] DOB: 1924/07/10 DOA: 10/06/2016 PCP: System, Pcp Not In  Outpatient Specialists: Cardiology, Wynonia Lawman Brief Narrative: Aaron Stephenson is a 83 y.o. male with a history of dementia, T2DM, AFib on eliquis,HLD, CAD, and recent Salmonella sepsis who presented to the ED 8/24 after a fall at [facility]. He reports arising from bed, walking to the bathroom, preparing to urinate, and suddenly feeling lightheaded when his right knee gave out and he fell backward hitting his head without LOC. On arrival, neuro exam was nonfocal. CT head demonstrated a 7 mm subdural hematoma arising from the falx. 1 dose of K-centra was given to reverse eliquis and the patient was admitted for neuro observation and work up of syncope. Repeat CT scan showed improvement in SDH and neurosurgery signed off, recommending continuing keppra for seizure prophylaxis, and holding eliquis at least until repeat CT in 4 weeks.   Assessment & Plan: Active Problems:   Polycythemia vera (HCC)   NSTEMI (non-ST elevated myocardial infarction) (HCC)   Type 2 diabetes mellitus with renal manifestations (HCC)   Hyperlipidemia   Kidney disease, chronic, stage III (GFR 30-59 ml/min)   Chronic venous insufficiency   Abnormal cardiovascular function study   Benign prostatic hyperplasia   GERD (gastroesophageal reflux disease)   Primary osteoarthritis of both knees   Hypertensive heart disease with congestive heart failure (HCC)   Chronic systolic CHF (congestive heart failure) ([place])   Coronary artery disease   Syncope  Fall: Multifactorial including micturition syncope, mild weakness/debility (receiving HH-PT), significant osteoarthritis, and relative hypotension (recently started torsemide). Labs, EKG unrevealing. Neuro exam unremarkable. Last echo 08/2016 EF40-45%. Afebrile, VSS. Recent admission for sepsis, has been on bactrim for UTI.  - Continue fall precautions, was found on  ground at bedside last night when bed alarm went off. No evident trauma.  - Echocardiogram ordered by admitting team. In light of mild troponin elevation, this is not unreasonable.  - Monitor blood cultures. Urine culture with 40k GNRs, no treatment indicated.  - Hold imdur, lisinopril, keep metoprolol at decreased dose to avoid tachycardia/PAF. Ok to continue torsemide for now.  - Minimize sedating medications.  - PT evaluation recommended SNF: [name] will be able to arrange this 8/27.   Subdural hematoma: Intrahemispheric, shows evidence of resolution on repeat CT 8/25.  - Continue keppra ppx x7 days per neurosurgery, who recommends no further interventions - Holding eliquis for at least 4 weeks pending repeat CT head as outpatient.   Paroxysmal atrial fibrillation: Recent Dx in setting of sepsis. Currently NSR.  - Decreased metoprolol to 12.5mg  BID as above due to hypotension. - Holding eliquis. Would personally recommend never restarting this due to fall risk and now SDH.   Elevated troponin: Mild, flat trend, no chest pain, nonischemic ECG.  - Checking echo, no further evaluation planned unless new wall motion abnormalities noted. Still pending.   T2DM: Controlled actually below goal for his age with HbA1c 5.2%  - Hold home medications - No SSI  Essential HTN: Chronic, has been hyPOtensive thus far.  - Holding antihypertensives as above, continue monitoring.   Hyperlipidemia: Chronic, stable.  - Continue home statin, would recommend discontinuing statin due to age and concurrent dementia.   Chronic systolic CHF: Currently compensated.  - Weights difficult to interpret on different scales (165lbs > 154lbs > 170lbs) but legs more edematous today. I'm inclined to continue torsemide, which is at a very low dose.  - Medications as above.  Acute kidney injury on CKD stage III: Baseline Cr ~1.3, was 1.97 on admission. Recently started torsemide, and BUN/Cr appears prerenal consistent  with dehydration, possibly contributing to fall.  - Hold lisinopril, will hold diuretic (was given today), may DC on reduced dose. - Monitor BMP   Polycythemia vera: +JAK mutation. Chronic, stable, currently with normal hgb.  - Outpatient follow up.   Thrombocytopenia: Chronic, stable.  - SDH is stable, no transfusion indicated.  History of prostate CA:  - Continue finasteride, flomax, tolterodine  GERD: Chronic, stable.  - Continue PPI  Osteoarthritis: Diffuse, significant in knees.  - Continue orthopedic treatments as outpatient.  DVT prophylaxis: SCDs Code Status: DNR Family Communication: Daughter at bedside, will update daughter who is HCPOA by phone. Disposition Plan: Stable for discharge, awaiting echocardiogram and placement (attempting [facility] for 24hr supervision, but admissions not available on Sundays, so we're unable to ensure they can provide the services recommended by PT).   Consultants:   Neurosurgery  Procedures:   None  Antimicrobials:  Bactrim   Subjective: No complaints. No dizziness, chest pain, dyspnea. Was found on ground last night, but doesn't recall too much about this. Seemed to be sundowning after daughter left after transfer to floor last night. Mentating at baseline per her today.   Objective: Vitals:   10/08/16 0213 10/08/16 0300 10/08/16 0607 10/08/16 1014  BP: (!) 108/55 123/62 (!) 126/57 (!) 108/54  Pulse: 79 74 65 67  Resp: 20 20 17 18   Temp: 98.4 F (36.9 C)  98 F (36.7 C) 98 F (36.7 C)  TempSrc: Oral  Axillary Oral  SpO2: 98% 97% 97% 99%  Weight:      Height:       General exam: Pleasant, alert elderly male in no distress Respiratory system: Non-labored breathing room air. Clear to auscultation bilaterally.  Cardiovascular system: Regular rate and rhythm. No murmur, rub, or gallop. No JVD, and no pedal edema. Gastrointestinal system: Abdomen soft, non-tender, non-distended, with normoactive bowel sounds.  No organomegaly or masses felt. Central nervous system: Alert, oriented to person, hospital, not date. No focal neurological deficits. Extremities: Warm, no deformities Skin: No rashes, lesions no ulcers. No wounds or bleeding. Head appears atraumatic.  Psychiatry: Judgement and insight appear impaired due to cognitive impairment. Poor short term recall. Mood & affect appropriate.   Data Reviewed: I have personally reviewed following labs and imaging studies  CBC:  Recent Labs Lab 10/06/16 1056 10/07/16 0210  WBC 6.7 7.2  NEUTROABS 4.6  --   HGB 14.2 12.7*  HCT 42.3 37.9*  MCV 89.1 88.3  PLT 98* 509*   Basic Metabolic Panel:  Recent Labs Lab 10/06/16 1056 10/07/16 0210 10/08/16 1048  NA 141 137 135  K 4.6 4.7 4.7  CL 108 107 106  CO2 25 23 24   GLUCOSE 80 123* 185*  BUN 43* 40* 34*  CREATININE 1.97* 1.75* 1.69*  CALCIUM 9.4 8.7* 8.8*   GFR: Estimated Creatinine Clearance: 27.3 mL/min (A) (by C-G formula based on SCr of 1.69 mg/dL (H)). Liver Function Tests:  Recent Labs Lab 10/06/16 1056 10/07/16 0210  AST 28 27  ALT 17 19  ALKPHOS 53 53  BILITOT 0.9 0.8  PROT 6.2* 5.4*  ALBUMIN 3.0* 2.7*   No results for input(s): LIPASE, AMYLASE in the last 168 hours. No results for input(s): AMMONIA in the last 168 hours. Coagulation Profile:  Recent Labs Lab 10/06/16 1056 10/07/16 0210  INR 1.18 1.14   Cardiac Enzymes:  Recent Labs Lab 10/06/16 1056 10/06/16 1429 10/06/16 2215 10/07/16 0210  TROPONINI 0.10* 0.12* 0.12* 0.13*   HbA1C:  Recent Labs  10/06/16 1429  HGBA1C 5.2   Urine analysis:    Component Value Date/Time   COLORURINE YELLOW 10/06/2016 1509   APPEARANCEUR HAZY (A) 10/06/2016 1509   LABSPEC 1.014 10/06/2016 1509   PHURINE 5.0 10/06/2016 1509   GLUCOSEU NEGATIVE 10/06/2016 1509   HGBUR NEGATIVE 10/06/2016 1509   BILIRUBINUR NEGATIVE 10/06/2016 1509   KETONESUR NEGATIVE 10/06/2016 1509   PROTEINUR NEGATIVE 10/06/2016 1509    UROBILINOGEN 1.0 09/19/2014 1610   NITRITE NEGATIVE 10/06/2016 1509   LEUKOCYTESUR LARGE (A) 10/06/2016 1509   Recent Results (from the past 240 hour(s))  Culture, blood (Routine X 2) w Reflex to ID Panel     Status: None (Preliminary result)   Collection Time: 10/06/16  2:20 PM  Result Value Ref Range Status   Specimen Description BLOOD RIGHT FOREARM  Final   Special Requests   Final    BOTTLES DRAWN AEROBIC AND ANAEROBIC Blood Culture adequate volume   Culture NO GROWTH 2 DAYS  Final   Report Status PENDING  Incomplete  Culture, blood (Routine X 2) w Reflex to ID Panel     Status: None (Preliminary result)   Collection Time: 10/06/16  2:33 PM  Result Value Ref Range Status   Specimen Description BLOOD RIGHT WRIST  Final   Special Requests IN PEDIATRIC BOTTLE Blood Culture adequate volume  Final   Culture NO GROWTH 2 DAYS  Final   Report Status PENDING  Incomplete  Urine culture     Status: Abnormal   Collection Time: 10/06/16  3:09 PM  Result Value Ref Range Status   Specimen Description URINE, CLEAN CATCH  Final   Special Requests Normal  Final   Culture 40,000 COLONIES/mL ESCHERICHIA COLI (A)  Final   Report Status 10/08/2016 FINAL  Final   Organism ID, Bacteria ESCHERICHIA COLI (A)  Final      Susceptibility   Escherichia coli - MIC*    AMPICILLIN >=32 RESISTANT Resistant     CEFAZOLIN <=4 SENSITIVE Sensitive     CEFTRIAXONE <=1 SENSITIVE Sensitive     CIPROFLOXACIN >=4 RESISTANT Resistant     GENTAMICIN >=16 RESISTANT Resistant     IMIPENEM <=0.25 SENSITIVE Sensitive     NITROFURANTOIN <=16 SENSITIVE Sensitive     TRIMETH/SULFA <=20 SENSITIVE Sensitive     AMPICILLIN/SULBACTAM 16 INTERMEDIATE Intermediate     PIP/TAZO <=4 SENSITIVE Sensitive     Extended ESBL NEGATIVE Sensitive     * 40,000 COLONIES/mL ESCHERICHIA COLI  MRSA PCR Screening     Status: None   Collection Time: 10/06/16  5:05 PM  Result Value Ref Range Status   MRSA by PCR NEGATIVE NEGATIVE Final     Comment:        The GeneXpert MRSA Assay (FDA approved for NASAL specimens only), is one component of a comprehensive MRSA colonization surveillance program. It is not intended to diagnose MRSA infection nor to guide or monitor treatment for MRSA infections.       Radiology Studies: Ct Head Wo Contrast  Result Date: 10/07/2016 CLINICAL DATA:  Follow-up scan, recent fall. EXAM: CT HEAD WITHOUT CONTRAST TECHNIQUE: Contiguous axial images were obtained from the base of the skull through the vertex without intravenous contrast. COMPARISON:  10/06/2016. FINDINGS: Brain: Decreased hyperattenuation, and diminishing thickness, of the interhemispheric subdural hematoma. Minimal residual hematoma greatest posteriorly. Slight prominence of the extracerebral CSF spaces  representing either hygroma or atrophy, most notable RIGHT frontal parasagittal region anteriorly. Stable atrophy with chronic microvascular ischemic change. Vascular: Calcification of the cavernous internal carotid arteries consistent with cerebrovascular atherosclerotic disease. No signs of intracranial large vessel occlusion. Skull: Intact. Sinuses/Orbits: Minor mucosal thickening in the ethmoids. No evidence for basilar skull fracture. Other: Unremarkable. IMPRESSION: Improving close head injury. Resolving interhemispheric subdural hematoma. No new findings of significance. Electronically Signed   By: Staci Righter M.D.   On: 10/07/2016 09:12    LOS: 2 days   Time spent: 25 minutes.  Vance Gather, MD [facility] Pager [phone]  If 7PM-7AM, please contact night-coverage [email or web address] Password [facility] 10/08/2016, 12:31 PM

## 2016-10-08 NOTE — Progress Notes (Signed)
Pt found on the floor, kneeling beside the bed with the bed alarm going off, pt confused and disoriented, hx of dementia, assisted back to bed, no injury observed, pt re-oriented, v/s stable, will continue to monitor. [place], [place] Ackert Efe

## 2016-10-08 NOTE — Progress Notes (Signed)
  Echocardiogram 2D Echocardiogram has been performed.  Aaron Stephenson 10/08/2016, 4:03 PM

## 2016-10-08 NOTE — Evaluation (Signed)
Occupational Therapy Evaluation Patient Details Name: Aaron Stephenson MRN: [id number] DOB: 1924-09-07 Today's Date: 10/08/2016    History of Present Illness Patient is a 83 yo male admitted 10/06/16 from SNF after a fall backward, hitting head.  Patient with SDH - no surgery per NS.     PMH:  DM, HTN, Afib, HLD, CAD, NSTEMI, CKD, dementia   Clinical Impression   PTA Pt resides in memory care at [facility] facility and got assistance for bathing/dressing, and used a RW for ambulation. Pt is currently max A for bathing/dressing and set up for seating grooming/eating ADL. Pt requires max A for stand pivot transfers without RW, and +2 for safety with gait. Daughter present during eval stating that her dad is "not quite back at baseline" and gets PT services, and has lots of help available for ADL at the facility. Pt and daughter were pleasant and excited to work with OT. Pt with improved cognition upon sitting in chair for lunch recalling his grandson who recently enlisted in the [employer]. Pt will benefit from skilled OT in the acute setting  Prior to his return to his SNF facility to maximize safety and independence in ADL and functional transfers.    Follow Up Recommendations  SNF;Supervision/Assistance - 24 hour    Equipment Recommendations  Other (comment) (defer to next venue of care)    Recommendations for Other Services       Precautions / Restrictions Precautions Precautions: Fall Precaution Comments: Fall pta Restrictions Weight Bearing Restrictions: No      Mobility Bed Mobility Overal bed mobility: Needs Assistance Bed Mobility: Supine to Sit;Rolling;Sidelying to Sit Rolling: Mod assist Sidelying to sit: Mod assist       General bed mobility comments: Verbal cues for technique.  Assist to bring LE's to EOB and raise trunk to sitting position.  Good balance once upright.  Transfers Overall transfer level: Needs assistance Equipment used: 1 person hand held  assist Transfers: Sit to/from [facility] Sit to Stand: Mod assist Stand pivot transfers: Max assist       General transfer comment: Patient required assist to power up to stance.  Then able to take several shuffle steps to pivot to chair.  Assist to control descent into chair.    Balance Overall balance assessment: Needs assistance;History of Falls Sitting-balance support: Bilateral upper extremity supported;Feet supported Sitting balance-Leahy Scale: Good Sitting balance - Comments: sitting EOB   Standing balance support: Bilateral upper extremity supported Standing balance-Leahy Scale: Poor Standing balance comment: reliant on external support from therapist                           ADL either performed or assessed with clinical judgement   ADL Overall ADL's : Needs assistance/impaired Eating/Feeding: Set up;Sitting Eating/Feeding Details (indicate cue type and reason): able to use spoon to eat soup, and self-feed grilled cheese Grooming: Wash/dry hands;Wash/dry face;Set up;Sitting Grooming Details (indicate cue type and reason): in recliner Upper Body Bathing: Minimal assistance;Sitting   Lower Body Bathing: Moderate assistance   Upper Body Dressing : Moderate assistance   Lower Body Dressing: Maximal assistance;Sit to/from stand Lower Body Dressing Details (indicate cue type and reason): to don adult underwear Toilet Transfer: Maximal assistance;Stand-pivot;Comfort height toilet Toilet Transfer Details (indicate cue type and reason): face to face hand held assist for transfer [place] and Hygiene: Maximal assistance Toileting - Clothing Manipulation Details (indicate cue type and reason): ma A to manage diaper, Pt  able to clean peri area with washcloth     Functional mobility during ADLs: Moderate assistance;+2 for safety/equipment;+2 for physical assistance;Rolling walker (clinical judgement)       Vision Baseline  Vision/History: Wears glasses Wears Glasses: At all times Patient Visual Report: No change from baseline       Perception     Praxis      Pertinent Vitals/Pain Pain Assessment: Faces Faces Pain Scale: Hurts little more Pain Location: back and shoulders Pain Descriptors / Indicators: Aching;Sore;Dull Pain Intervention(s): Monitored during session;Repositioned     Hand Dominance Right   Extremity/Trunk Assessment Upper Extremity Assessment Upper Extremity Assessment: Generalized weakness   Lower Extremity Assessment Lower Extremity Assessment: Defer to PT evaluation   Cervical / Trunk Assessment Cervical / Trunk Assessment: Kyphotic   Communication Communication Communication: HOH   Cognition Arousal/Alertness: Awake/alert Behavior During Therapy: WFL for tasks assessed/performed Overall Cognitive Status: Impaired/Different from baseline Area of Impairment: Awareness;Problem solving;Memory                     Memory: Decreased short-term memory     Awareness: Emergent Problem Solving: Slow processing;Requires verbal cues General Comments: Oriented x2.  Able to follow commands.  Daughter says that he is a little off his baseline, much improved once sitting in the chair   General Comments  Pt's daughter present for session. Pt used urinal and OT also changed diaper during session    Exercises     Shoulder Instructions      Home Living Family/patient expects to be discharged to:: Other (Comment) (Memory Care Unit at [facility])                                 Additional Comments: Per chart and daughter, patient from memory care unit where he receives PT services and there are lots of aide who assist with ADL      Prior Functioning/Environment Level of Independence: Needs assistance  Gait / Transfers Assistance Needed: Assist with ambulation with use of RW ADL's / Homemaking Assistance Needed: Assist with ADL's            OT Problem  List: Impaired balance (sitting and/or standing);Decreased cognition;Decreased safety awareness;Pain      OT Treatment/Interventions: Self-care/ADL training;DME and/or AE instruction;Therapeutic activities;Cognitive remediation/compensation;Patient/family education;Balance training    OT Goals(Current goals can be found in the care plan section) Acute Rehab OT Goals Patient Stated Goal: Return to Rehab facility OT Goal Formulation: With patient/family Time For Goal Achievement: 10/22/16 Potential to Achieve Goals: Good ADL Goals Pt Will Perform Grooming: with supervision;sitting Pt Will Transfer to Toilet: with min guard assist;stand pivot transfer (with RW; caregiver independent in assisting) Pt Will Perform Toileting - Clothing Manipulation and hygiene: with min guard assist;sit to/from stand Additional ADL Goal #1: Pt will perform bed mobility at supervision level prior to engaging in ADL activity  OT Frequency: Min 2X/week   Barriers to D/C:            Co-evaluation              AM-PAC PT "6 Clicks" Daily Activity     Outcome Measure Help from another person eating meals?: A Little Help from another person taking care of personal grooming?: A Little Help from another person toileting, which includes using toliet, bedpan, or urinal?: A Lot Help from another person bathing (including washing, rinsing, drying)?: A Lot Help from another person to  put on and taking off regular upper body clothing?: A Lot Help from another person to put on and taking off regular lower body clothing?: A Lot 6 Click Score: 14   End of Session Equipment Utilized During Treatment: Gait belt Nurse Communication: Mobility status;Other (comment) (2 person assist back to bed for pivot)  Activity Tolerance: Patient tolerated treatment well Patient left: in chair;with call bell/phone within reach;with chair alarm set;with family/visitor present  OT Visit Diagnosis: Unsteadiness on feet (R26.81);Other  abnormalities of gait and mobility (R26.89);History of falling (Z91.81);Pain Pain - Right/Left: Left Pain - part of body: Shoulder (back)                Time: 2703-5009 OT Time Calculation (min): 27 min Charges:  OT General Charges $OT Visit: 1 Procedure OT Evaluation $OT Eval Moderate Complexity: 1 Procedure OT Treatments $Self Care/Home Management : 8-22 mins G-Codes:     Hulda Humphrey OTR/L [place] 10/08/2016, 1:27 PM

## 2016-10-09 LAB — BASIC METABOLIC PANEL
Anion gap: 5 (ref 5–15)
BUN: 36 mg/dL — AB (ref 6–20)
CO2: 25 mmol/L (ref 22–32)
CREATININE: 1.65 mg/dL — AB (ref 0.61–1.24)
Calcium: 9 mg/dL (ref 8.9–10.3)
Chloride: 108 mmol/L (ref 101–111)
GFR calc Af Amer: 41 mL/min — ABNORMAL LOW (ref >60)
GFR, EST NON AFRICAN AMERICAN: 35 mL/min — AB (ref >60)
GLUCOSE: 113 mg/dL — AB (ref 65–99)
Potassium: 4.7 mmol/L (ref 3.5–5.1)
SODIUM: 138 mmol/L (ref 135–145)

## 2016-10-09 MED ORDER — LEVETIRACETAM 500 MG PO TABS: 500.0000 mg | ORAL_TABLET | Freq: Two times a day (BID) | ORAL | 0 refills | Status: DC

## 2016-10-09 NOTE — Progress Notes (Signed)
Discharge to: [place] Anticipated discharge date: 10/09/16 Family notified: Yes, at bedside Transportation by: Family car  Report #: [phone], ask for Angie  [name] signing off.  Laveda Abbe LCSW [phone]

## 2016-10-09 NOTE — NC FL2 (Signed)
[place] LEVEL OF CARE SCREENING TOOL     IDENTIFICATION  Patient Name: Aaron Stephenson Birthdate: 1925/01/10 Sex: male Admission Date (Current Location): 10/06/2016  Aaron Stephenson and [place] Number:  Herbalist and Address:  [facility] [place]. [facility], [place] [address] [place], [place] [address]      Provider Number: [id number]  Attending Physician Name and Address:  Patrecia Pour, MD  Relative Name and Phone Number:       Current Level of Care: [facility] Recommended Level of Care: Memory Care Prior Approval Number:    Date Approved/Denied:   PASRR Number:    Discharge Plan: Other (Comment) (Memory care unit)    Current Diagnoses: Patient Active Problem List   Diagnosis Date Noted  . Syncope 10/06/2016  . Cellulitis 09/04/2016  . New onset a-fib ([place]) 08/19/2016  . Hypertensive heart disease with congestive heart failure ([place]) 08/19/2016  . Chronic systolic CHF (congestive heart failure) ([place]) 08/19/2016  . Coronary artery disease 08/19/2016  . Pressure injury of skin 08/16/2016  . Salmonella bacteremia 08/16/2016  . Sepsis ([place]) 08/11/2016  . Cellulitis in diabetic foot ([place]) 08/11/2016  . HCAP (healthcare-associated pneumonia) 08/11/2016  . Bilateral knee effusions 05/01/2016  . Primary osteoarthritis of both knees 05/01/2016  . GERD (gastroesophageal reflux disease) 07/26/2015  . Toxic encephalopathy 07/21/2015  . Kidney disease, chronic, stage III (GFR 30-59 ml/min) 09/21/2014  . Abnormal cardiovascular function study 09/21/2014  . Benign prostatic hyperplasia 09/21/2014  . Hyperlipidemia   . Chronic venous insufficiency   . NSTEMI (non-ST elevated myocardial infarction) ([place]) 09/19/2014  . Type 2 diabetes mellitus with renal manifestations ([place]) 09/19/2014  . Polycythemia vera ([place]) 02/20/2011    Orientation RESPIRATION BLADDER Height & Weight     Self, Place  Normal Incontinent Weight: 170 lb (77.1 kg) Height:  5\' 4"   (162.6 cm)  BEHAVIORAL SYMPTOMS/MOOD NEUROLOGICAL BOWEL NUTRITION STATUS      Incontinent  (Please see d/c summary)  AMBULATORY STATUS COMMUNICATION OF NEEDS Skin   Extensive Assist Verbally Normal                       Personal Care Assistance Level of Assistance  Bathing, Feeding, Dressing Bathing Assistance: Maximum assistance Feeding assistance: Limited assistance Dressing Assistance: Maximum assistance     Functional Limitations Info  Sight, Hearing, Speech Sight Info: Adequate Hearing Info: Adequate Speech Info: Adequate    SPECIAL CARE FACTORS FREQUENCY  PT (By licensed PT), OT (By licensed OT)     PT Frequency: 3x/wk with home health OT Frequency: 3x/wk with home health            Contractures Contractures Info: Not present    Additional Factors Info  Code Status, Allergies Code Status Info: DNR Allergies Info: Penicillins, Aspirin, Cardura Doxazosin, Ciprofloxacin, Macrodantin Nitrofurantoin Macrocrystal, Other, Oxycodone, Sulfamethoxazole, Tramadol, Unisom Doxylamine Succinate (Sleep), Xanax Alprazolam           Current Medications (10/09/2016):  This is the current Stephenson active medication list Current Facility-Administered Medications  Medication Dose Route Frequency Provider Last Rate Last Dose  . acetaminophen (TYLENOL) tablet 650 mg  650 mg Oral Q6H PRN Rondel Jumbo, PA-C       Or  . acetaminophen (TYLENOL) suppository 650 mg  650 mg Rectal Q6H PRN Rondel Jumbo, PA-C      . atorvastatin (LIPITOR) tablet 10 mg  10 mg Oral QHS Wertman, Sara E, PA-C   10 mg at  10/08/16 2158  . docusate sodium (COLACE) capsule 100 mg  100 mg Oral Daily Rondel Jumbo, PA-C   100 mg at 10/09/16 3785  . fesoterodine (TOVIAZ) tablet 4 mg  4 mg Oral Daily Rondel Jumbo, PA-C   4 mg at 10/09/16 8850  . finasteride (PROSCAR) tablet 5 mg  5 mg Oral QHS Rondel Jumbo, PA-C   5 mg at 10/08/16 2158  . levETIRAcetam (KEPPRA) tablet 500 mg  500 mg Oral BID Patrecia Pour, MD   500 mg at 10/09/16 0902  . MEDLINE mouth rinse  15 mL Mouth Rinse BID Lady Deutscher, MD   15 mL at 10/09/16 2774  . metoprolol tartrate (LOPRESSOR) tablet 12.5 mg  12.5 mg Oral BID Patrecia Pour, MD   12.5 mg at 10/09/16 0902  . mirtazapine (REMERON) tablet 15 mg  15 mg Oral QHS Rondel Jumbo, PA-C   15 mg at 10/08/16 2158  . ondansetron (ZOFRAN) tablet 4 mg  4 mg Oral Q6H PRN Rondel Jumbo, PA-C       Or  . ondansetron [facility]) injection 4 mg  4 mg Intravenous Q6H PRN Sharene Butters E, PA-C      . sodium chloride flush (NS) 0.9 % injection 3 mL  3 mL Intravenous Q12H Rondel Jumbo, PA-C   3 mL at 10/09/16 0903  . sulfamethoxazole-trimethoprim (BACTRIM DS,SEPTRA DS) 800-160 MG per tablet 1 tablet  1 tablet Oral Daily Rondel Jumbo, PA-C   1 tablet at 10/09/16 1287  . tamsulosin (FLOMAX) capsule 0.4 mg  0.4 mg Oral BID Rondel Jumbo, PA-C   0.4 mg at 10/09/16 8676  . torsemide (DEMADEX) tablet 10 mg  10 mg Oral Daily Patrecia Pour, MD   10 mg at 10/09/16 7209     Discharge Medications: Please see discharge summary for a list of discharge medications.  Relevant Imaging Results:  Relevant Lab Results:   Additional Information SSN: [id number]  [name], [place]

## 2016-10-09 NOTE — Discharge Summary (Signed)
Physician Discharge Summary  Aaron Stephenson HKV:[id number] DOB: 1924-02-21 DOA: 10/06/2016  PCP: System, Pcp Not In  Admit date: 10/06/2016 Discharge date: 10/09/2016  Admitted From: [facility] Disposition: [place]   Recommendations for Outpatient Follow-up:  1. Follow up with cardiology in next 2 weeks. Discharged to continue torsemide 10mg  daily.  2. Continue keppra ppx x7 days per neurosurgery, who recommends no further interventions for traumatic SDH. 3. Holding eliquis for at least 4 weeks pending repeat CT head as outpatient.  4. Monitor BP, holding imdur.   Home Health: Resume PT Equipment/Devices: None new Discharge Condition: Stable CODE STATUS: DNR Diet recommendation: Heart healthy, carb-modified  Brief/Interim Summary: Aaron Stephenson a 83 y.o.male with a history of dementia, T2DM, AFib on eliquis,HLD, CAD, and recent Salmonella sepsis who presented to the ED 8/24 after a fall at [facility]. He reports arising from bed, walking to the bathroom, preparing to urinate, and suddenly feeling lightheaded when his right knee gave out and he fell backward hitting his head without LOC. On arrival, neuro exam was nonfocal. CT head demonstrated a 7 mm subdural hematoma arising from the falx. 1 dose of K-centra was given to reverse eliquis and the patient was admitted for neuro observation and work up of syncope. Repeat CT scan showed improvement in SDH and neurosurgery signed off, recommending continuing keppra for seizure prophylaxis, and holding eliquis at least until repeat CT in 4 weeks.   Discharge Diagnoses:  Active Problems:   Polycythemia vera (HCC)   NSTEMI (non-ST elevated myocardial infarction) (HCC)   Type 2 diabetes mellitus with renal manifestations (HCC)   Hyperlipidemia   Kidney disease, chronic, stage III (GFR 30-59 ml/min)   Chronic venous insufficiency   Abnormal cardiovascular function study   Benign prostatic  hyperplasia   GERD (gastroesophageal reflux disease)   Primary osteoarthritis of both knees   Hypertensive heart disease with congestive heart failure (HCC)   Chronic systolic CHF (congestive heart failure) ([place])   Coronary artery disease   Syncope  Fall: Multifactorial including micturition syncope, mild weakness/debility (receiving HH-PT), significant osteoarthritis, and possibly relative hypotension (recently started torsemide). Labs, EKG unrevealing. Neuro exam unremarkable. Last echo 08/2016 EF40-45%, updated echo 10/08/2016 shows improved EF to 50-55% with grade 3 diastolic dysfunction and changes consistent with pulmonary HTN. - Continue fall precautions. - Monitor blood cultures. Urine culture with 40k GNRs, no treatment indicated.  - Recommend monitoring BP closely and strongly considering decreasing anti-hypertensives if hypotensive. Cotninue lisinopril, metoprolol and torsemide. - Hold imdur until follow up. - Minimize sedating medications.  - PT evaluation recommended SNF, family and patient wish to return to Memory Care Unit with home health PT.   Subdural hematoma: Intrahemispheric, shows evidence of resolution on repeat CT 8/25.  - Continue keppra ppx x7 days per neurosurgery (5 more days), who recommends no further interventions - Holding eliquis for at least 4 weeks pending repeat CT head as outpatient.   Paroxysmal atrial fibrillation: Recent Dx in setting of sepsis. Currently NSR.  - Continue metoprolol - Holding eliquis. Would personally recommend never restarting this due to fall risk and now SDH.   Elevated troponin: Mild, flat trend, no chest pain, nonischemic ECG.  - No wall motion abnormalities or chest pain.  - Follow up with cardiology.  T2DM: Controlled actually below goal for his age with HbA1c 5.2%  - Restart medications, but consider stopping these.  Essential HTN: Chronic, has been hyPOtensive.  - Holding imdur  Hyperlipidemia: Chronic, stable.   -  Continue home statin, would recommend discontinuing statin due to age and concurrent dementia.   Chronic diastolic CHF: EF improved by recent echocardiogram, but has grade 3 diastolic dysfunction. Currently compensated.  - Legs became more edematous with holding torsemide x1 day, so this was restarted at low dose.  - Medications as above.   Acute kidney injury on CKD stage III: Baseline Cr ~1.3, was 1.97 on admission. Recently started torsemide, and BUN/Cr appears prerenal consistent with dehydration, possibly contributing to fall. Also possibly related to bactrim, course completed. - Held lisinopril with improvement to 1.65 at discharge.  - Monitor BMP   Polycythemia vera: +JAK mutation. Chronic, stable, currently with normal hgb.  - Outpatient follow up.   Thrombocytopenia: Chronic, stable.  - SDH is stable, no transfusion indicated.  History of prostate CA:  - Continue finasteride, flomax, tolterodine  GERD: Chronic, stable.  - Continue PPI  Osteoarthritis: Diffuse, significant in knees.  - Continue orthopedic treatments as outpatient.  Discharge Instructions Discharge Instructions    Diet - low sodium heart healthy    Complete by:  As directed    Increase activity slowly    Complete by:  As directed      Allergies as of 10/09/2016      Reactions   Penicillins Anaphylaxis, Hives, Swelling   Per MAR Has patient had a PCN reaction causing immediate rash, facial/tongue/throat swelling, SOB or lightheadedness with hypotension: unknown Has patient had a PCN reaction causing severe rash involving mucus membranes or skin necrosis: unknown Has patient had a PCN reaction that required hospitalization: unknown Has patient had a PCN reaction occurring within the last 10 years: unknown If all of the above answers are "NO", then may proceed with Cephalosporin use. Pts family is unable to answer   Aspirin Other (See Comments)   Cardura [doxazosin]    Per MAR    Ciprofloxacin Other (See Comments)   Per MAR Joint swelling   Macrodantin [nitrofurantoin Macrocrystal] Other (See Comments)   Chest pains   Other    "lubriderm pain pill?"   Oxycodone Other (See Comments)   Pt took OxyContin and tramadol together and almost "died"   Sulfamethoxazole Other (See Comments)   Unknown - doesn't remember reaction   Tramadol Other (See Comments)   Blank stare, no expression, becoming addictive   Unisom [doxylamine Succinate (sleep)] Other (See Comments)   Hangover the next day   Xanax [alprazolam] Other (See Comments)   loopy      Medication List    STOP taking these medications   apixaban 2.5 MG Tabs tablet Commonly known as:  ELIQUIS   isosorbide mononitrate 60 MG 24 hr tablet Commonly known as:  IMDUR   NON FORMULARY   sulfamethoxazole-trimethoprim 800-160 MG tablet Commonly known as:  BACTRIM DS,SEPTRA DS     TAKE these medications   acetaminophen 650 MG CR tablet Commonly known as:  TYLENOL Take 650 mg by mouth 3 (three) times daily.   acetaminophen 325 MG tablet Commonly known as:  TYLENOL Take 650 mg by mouth every 4 (four) hours as needed for moderate pain.   atorvastatin 10 MG tablet Commonly known as:  LIPITOR Take 10 mg by mouth at bedtime.   docusate sodium 100 MG capsule Commonly known as:  COLACE Take 100 mg by mouth daily.   finasteride 5 MG tablet Commonly known as:  PROSCAR Take 5 mg by mouth at bedtime.   glipiZIDE 5 MG 24 hr tablet Commonly known as:  GLUCOTROL XL Take  5 mg by mouth daily.   levETIRAcetam 500 MG tablet Commonly known as:  KEPPRA Take 1 tablet (500 mg total) by mouth 2 (two) times daily.   lisinopril 5 MG tablet Commonly known as:  PRINIVIL,ZESTRIL Take 5 mg by mouth at bedtime.   metoprolol tartrate 50 MG tablet Commonly known as:  LOPRESSOR Take 25 mg by mouth 2 (two) times daily.   mirtazapine 15 MG tablet Commonly known as:  REMERON Take 15 mg by mouth at bedtime.    nitroGLYCERIN 0.4 MG SL tablet Commonly known as:  NITROSTAT Place 1 tablet (0.4 mg total) under the tongue every 5 (five) minutes x 3 doses as needed for chest pain.   tamsulosin 0.4 MG Caps capsule Commonly known as:  FLOMAX Take 0.4 mg by mouth 2 (two) times daily.   tolterodine 4 MG 24 hr capsule Commonly known as:  DETROL LA Take 4 mg by mouth at bedtime.   torsemide 10 MG tablet Commonly known as:  DEMADEX Take 10 mg by mouth daily.            Discharge Care Instructions        Start     Ordered   10/09/16 0000  levETIRAcetam (KEPPRA) 500 MG tablet  2 times daily     10/09/16 1117   10/09/16 0000  Increase activity slowly     10/09/16 1117   10/09/16 0000  Diet - low sodium heart healthy     10/09/16 1117      Allergies  Allergen Reactions  . Penicillins Anaphylaxis, Hives and Swelling    Per MAR  Has patient had a PCN reaction causing immediate rash, facial/tongue/throat swelling, SOB or lightheadedness with hypotension: unknown Has patient had a PCN reaction causing severe rash involving mucus membranes or skin necrosis: unknown Has patient had a PCN reaction that required hospitalization: unknown Has patient had a PCN reaction occurring within the last 10 years: unknown If all of the above answers are "NO", then may proceed with Cephalosporin use. Pts family is unable to answer  . Aspirin Other (See Comments)  . Cardura [Doxazosin]     Per MAR  . Ciprofloxacin Other (See Comments)    Per MAR Joint swelling  . Macrodantin [Nitrofurantoin Macrocrystal] Other (See Comments)    Chest pains  . Other     "lubriderm pain pill?"  . Oxycodone Other (See Comments)    Pt took OxyContin and tramadol together and almost "died"  . Sulfamethoxazole Other (See Comments)    Unknown - doesn't remember reaction  . Tramadol Other (See Comments)    Blank stare, no expression, becoming addictive  . Unisom [Doxylamine Succinate (Sleep)] Other (See Comments)     Hangover the next day  . Xanax [Alprazolam] Other (See Comments)    loopy    Consultations:  Neurosurgery  Procedures/Studies: X-ray Chest Pa And Lateral  Result Date: 10/06/2016 CLINICAL DATA:  83 year old male with a history of weakness EXAM: CHEST  2 VIEW COMPARISON:  08/14/2016, 08/12/2016, abdominal CT 08/13/2016 FINDINGS: Cardiomediastinal silhouette unchanged with persisting cardiomegaly. Calcifications of the aortic arch. Low lung volumes, similar to prior. Mixed interstitial and airspace opacity at the right lung base, increased from the comparison plain film. There does not appear to be a correlate on the abdominal CT of 08/13/2016. Opacity at the left base obscuring the left hemidiaphragm with blunting on the costophrenic sulcus lateral view. Degenerative changes of the spine. Degenerative changes of the shoulders. No displaced fracture. IMPRESSION: Small  bilateral pleural effusions, larger on the left. Mixed interstitial and airspace opacity at the right lung base, which appears new from the abdominal CT dated 08/13/2016. Leading differential includes atelectasis/ scarring and/or pneumonitis/pneumonia. Electronically Signed   By: Corrie Mckusick D.O.   On: 10/06/2016 13:41   Dg Elbow 2 Views Right  Result Date: 10/06/2016 CLINICAL DATA:  Pain following fall EXAM: RIGHT ELBOW - 2 VIEW COMPARISON:  None. FINDINGS: Frontal and lateral views were obtained. There is soft tissue swelling. There is evidence of an avulsion along the lateral distal humeral condyle with displacement of the fracture fragment approximately 3.5 mm from the remainder of the humerus. The borders of this fragment are subtly irregular suggesting recent injury. No other evidence suggesting fracture. No dislocation. No joint effusion. There is no appreciable joint space narrowing or erosion. IMPRESSION: Recent appearing avulsion along the distal lateral humeral condyle. No other evidence of fracture. There is soft tissue  swelling. No dislocation. Electronically Signed   By: Lowella Grip III M.D.   On: 10/06/2016 10:29   Ct Head Wo Contrast  Result Date: 10/07/2016 CLINICAL DATA:  Follow-up scan, recent fall. EXAM: CT HEAD WITHOUT CONTRAST TECHNIQUE: Contiguous axial images were obtained from the base of the skull through the vertex without intravenous contrast. COMPARISON:  10/06/2016. FINDINGS: Brain: Decreased hyperattenuation, and diminishing thickness, of the interhemispheric subdural hematoma. Minimal residual hematoma greatest posteriorly. Slight prominence of the extracerebral CSF spaces representing either hygroma or atrophy, most notable RIGHT frontal parasagittal region anteriorly. Stable atrophy with chronic microvascular ischemic change. Vascular: Calcification of the cavernous internal carotid arteries consistent with cerebrovascular atherosclerotic disease. No signs of intracranial large vessel occlusion. Skull: Intact. Sinuses/Orbits: Minor mucosal thickening in the ethmoids. No evidence for basilar skull fracture. Other: Unremarkable. IMPRESSION: Improving close head injury. Resolving interhemispheric subdural hematoma. No new findings of significance. Electronically Signed   By: Staci Righter M.D.   On: 10/07/2016 09:12   Ct Head Wo Contrast  Result Date: 10/06/2016 CLINICAL DATA:  Headache and neck pain following fall EXAM: CT HEAD WITHOUT CONTRAST CT CERVICAL SPINE WITHOUT CONTRAST TECHNIQUE: Multidetector CT imaging of the head and cervical spine was performed following the standard protocol without intravenous contrast. Multiplanar CT image reconstructions of the cervical spine were also generated. COMPARISON:  Head CT Jun 27, 2016 and August 11, 2016 FINDINGS: CT HEAD FINDINGS Brain: There is mild diffuse atrophy. There is somewhat more atrophy in the frontal lobes than elsewhere, stable. There is a focal subdural hematoma in the falx measuring 7 mm in maximal thickness. There is no appreciable mass  effect from this falcine subdural hematoma. No other extra-axial fluid collection evident. No intra-axial hemorrhage seen. There is no mass or midline shift. There is patchy small vessel disease in the centra semiovale bilaterally. No acute infarct evident. Vascular: No hyperdense vessels. There is mild calcification in each carotid siphon region. There is also calcification in the distal vertebral arteries. Skull: The bony calvarium appears intact. Sinuses/Orbits: There is opacification of multiple ethmoid air cells. Other paranasal sinuses are clear. There is nasal turbinate edema bilaterally. Orbits appear symmetric bilaterally. Other: Mastoid air cells are clear. CT CERVICAL SPINE FINDINGS Alignment: There is lowers cervical levoscoliosis. There is 2 mm of anterolisthesis of C5 on C6. There is just over 3 mm of anterolisthesis of C7 on T1. There is 2 mm of anterolisthesis of T1 on T2. Skull base and vertebrae: These skull base and craniocervical junction regions appear normal. There is no demonstrable fracture.  There are no blastic or lytic bone lesions. Soft tissues and spinal canal: Prevertebral soft tissues and predental space regions are normal. No paraspinous lesions. No cord or canal hematoma evident. Disc levels: There is severe disc space narrowing at C6-7, C7-T1, and T1-2, as well as other visualized thoracic levels. There is moderate disc space narrowing at C5-6. There is multilevel facet hypertrophy. There is moderately severe exit foraminal narrowing on the left at C5-6 and C6-7. There is no frank disc extrusion or stenosis. Upper chest: Visualized upper lung regions appear unremarkable. Other: There are foci of calcification in each carotid artery. IMPRESSION: CT head: Subdural hematoma arising in the falx with maximum thickness 7 mm. No mass effect. No other extra-axial fluid. No intra-axial mass or hemorrhage. There is atrophy with patchy periventricular small vessel disease. There is no evident  acute infarct. There are mild foci of arterial vascular calcification. There is opacification of multiple ethmoid air cells. There is nasal turbinate edema bilaterally. CT cervical spine: No fracture. Areas of mild spondylolisthesis are felt to be due to underlying spondylosis. There is extensive multilevel osteoarthritic change. No frank disc extrusion or stenosis. Foci of carotid artery calcification bilaterally. Critical Value/emergent results were called by telephone at the time of interpretation on 10/06/2016 at 10:57 am to Dr. Dolly Rias, ED physician, who verbally acknowledged these results. Electronically Signed   By: Lowella Grip III M.D.   On: 10/06/2016 10:58   Ct Cervical Spine Wo Contrast  Result Date: 10/06/2016 CLINICAL DATA:  Headache and neck pain following fall EXAM: CT HEAD WITHOUT CONTRAST CT CERVICAL SPINE WITHOUT CONTRAST TECHNIQUE: Multidetector CT imaging of the head and cervical spine was performed following the standard protocol without intravenous contrast. Multiplanar CT image reconstructions of the cervical spine were also generated. COMPARISON:  Head CT Jun 27, 2016 and August 11, 2016 FINDINGS: CT HEAD FINDINGS Brain: There is mild diffuse atrophy. There is somewhat more atrophy in the frontal lobes than elsewhere, stable. There is a focal subdural hematoma in the falx measuring 7 mm in maximal thickness. There is no appreciable mass effect from this falcine subdural hematoma. No other extra-axial fluid collection evident. No intra-axial hemorrhage seen. There is no mass or midline shift. There is patchy small vessel disease in the centra semiovale bilaterally. No acute infarct evident. Vascular: No hyperdense vessels. There is mild calcification in each carotid siphon region. There is also calcification in the distal vertebral arteries. Skull: The bony calvarium appears intact. Sinuses/Orbits: There is opacification of multiple ethmoid air cells. Other paranasal sinuses are clear.  There is nasal turbinate edema bilaterally. Orbits appear symmetric bilaterally. Other: Mastoid air cells are clear. CT CERVICAL SPINE FINDINGS Alignment: There is lowers cervical levoscoliosis. There is 2 mm of anterolisthesis of C5 on C6. There is just over 3 mm of anterolisthesis of C7 on T1. There is 2 mm of anterolisthesis of T1 on T2. Skull base and vertebrae: These skull base and craniocervical junction regions appear normal. There is no demonstrable fracture. There are no blastic or lytic bone lesions. Soft tissues and spinal canal: Prevertebral soft tissues and predental space regions are normal. No paraspinous lesions. No cord or canal hematoma evident. Disc levels: There is severe disc space narrowing at C6-7, C7-T1, and T1-2, as well as other visualized thoracic levels. There is moderate disc space narrowing at C5-6. There is multilevel facet hypertrophy. There is moderately severe exit foraminal narrowing on the left at C5-6 and C6-7. There is no frank disc extrusion or  stenosis. Upper chest: Visualized upper lung regions appear unremarkable. Other: There are foci of calcification in each carotid artery. IMPRESSION: CT head: Subdural hematoma arising in the falx with maximum thickness 7 mm. No mass effect. No other extra-axial fluid. No intra-axial mass or hemorrhage. There is atrophy with patchy periventricular small vessel disease. There is no evident acute infarct. There are mild foci of arterial vascular calcification. There is opacification of multiple ethmoid air cells. There is nasal turbinate edema bilaterally. CT cervical spine: No fracture. Areas of mild spondylolisthesis are felt to be due to underlying spondylosis. There is extensive multilevel osteoarthritic change. No frank disc extrusion or stenosis. Foci of carotid artery calcification bilaterally. Critical Value/emergent results were called by telephone at the time of interpretation on 10/06/2016 at 10:57 am to Dr. Dolly Rias, ED physician,  who verbally acknowledged these results. Electronically Signed   By: Lowella Grip III M.D.   On: 10/06/2016 10:58   ECHOCARDIOGRAM 10/08/2016: - Left ventricle: The cavity size was normal. There was moderate   concentric hypertrophy. Systolic function was normal. The   estimated ejection fraction was in the range of 50% to 55%. Wall   motion was normal; there were no regional wall motion   abnormalities. There was a reduced contribution of atrial   contraction to ventricular filling, due to increased ventricular   diastolic pressure or atrial contractile dysfunction. Doppler   parameters are consistent with a reversible restrictive pattern,   indicative of decreased left ventricular diastolic compliance   and/or increased left atrial pressure (grade 3 diastolic   dysfunction). Doppler parameters are consistent with high   ventricular filling pressure. - Aortic valve: Moderately calcified annulus. Trileaflet;   moderately thickened, mildly calcified leaflets. There was   moderate regurgitation. Regurgitation pressure half-time: 289 ms. - Mitral valve: Calcified annulus. There was mild to moderate   regurgitation. Valve area by pressure half-time: 1.75 cm^2. Valve   area by continuity equation (using LVOT flow): 2.37 cm^2.   Effective regurgitant orifice (PISA): 0.3 cm^2. Regurgitant   volume (PISA): 44 ml. - Left atrium: The atrium was moderately dilated. - Tricuspid valve: There was trivial regurgitation. - Pulmonary arteries: PA peak pressure: 48 mm Hg (S).  Impressions:  - The right ventricular systolic pressure was increased consistent   with moderate pulmonary hypertension.  Subjective: Feels well, got up again with therapy, walked the halls, feels legs are much stronger. Pt and family do not want to go to SNF. No bleeding or fever.   Discharge Exam: Vitals:   10/09/16 0548 10/09/16 0800  BP: 112/62 (!) 107/57  Pulse: 66 66  Resp: 20 20  Temp: 98 F (36.7 C) 97.7  F (36.5 C)  SpO2: 98% 96%   General: Pleasant elderly male, no distress. HEENT: [place]/AT Cardiovascular: RRR, S1/S2 +, no rubs, no gallops; trace LE edema.  Respiratory: CTA bilaterally, no wheezing, no rhonchi Abdominal: Soft, NT, ND, bowel sounds + Psych: Judgement and insight appear impaired due to cognitive impairment. Poor short term recall. Mood & affect appropriate.   Labs: BNP (last 3 results) No results for input(s): BNP in the last 8760 hours. Basic Metabolic Panel:  Recent Labs Lab 10/06/16 1056 10/07/16 0210 10/08/16 1048 10/09/16 0415  NA 141 137 135 138  K 4.6 4.7 4.7 4.7  CL 108 107 106 108  CO2 25 23 24 25   GLUCOSE 80 123* 185* 113*  BUN 43* 40* 34* 36*  CREATININE 1.97* 1.75* 1.69* 1.65*  CALCIUM 9.4 8.7* 8.8* 9.0  Liver Function Tests:  Recent Labs Lab 10/06/16 1056 10/07/16 0210  AST 28 27  ALT 17 19  ALKPHOS 53 53  BILITOT 0.9 0.8  PROT 6.2* 5.4*  ALBUMIN 3.0* 2.7*   No results for input(s): LIPASE, AMYLASE in the last 168 hours. No results for input(s): AMMONIA in the last 168 hours. CBC:  Recent Labs Lab 10/06/16 1056 10/07/16 0210  WBC 6.7 7.2  NEUTROABS 4.6  --   HGB 14.2 12.7*  HCT 42.3 37.9*  MCV 89.1 88.3  PLT 98* 107*   Cardiac Enzymes:  Recent Labs Lab 10/06/16 1056 10/06/16 1429 10/06/16 2215 10/07/16 0210  TROPONINI 0.10* 0.12* 0.12* 0.13*   BNP: Invalid input(s): POCBNP CBG:  Recent Labs Lab 10/07/16 2043 10/07/16 2141 10/08/16 0802 10/08/16 1134 10/08/16 1623  GLUCAP 120* 116* 116* 164* 136*   D-Dimer No results for input(s): DDIMER in the last 72 hours. Hgb A1c  Recent Labs  10/06/16 1429  HGBA1C 5.2   Lipid Profile No results for input(s): CHOL, HDL, LDLCALC, TRIG, CHOLHDL, LDLDIRECT in the last 72 hours. Thyroid function studies No results for input(s): TSH, T4TOTAL, T3FREE, THYROIDAB in the last 72 hours.  Invalid input(s): FREET3 Anemia work up No results for input(s): VITAMINB12,  FOLATE, FERRITIN, TIBC, IRON, RETICCTPCT in the last 72 hours. Urinalysis    Component Value Date/Time   COLORURINE YELLOW 10/06/2016 1509   APPEARANCEUR HAZY (A) 10/06/2016 1509   LABSPEC 1.014 10/06/2016 1509   PHURINE 5.0 10/06/2016 1509   GLUCOSEU NEGATIVE 10/06/2016 1509   HGBUR NEGATIVE 10/06/2016 1509   BILIRUBINUR NEGATIVE 10/06/2016 1509   KETONESUR NEGATIVE 10/06/2016 1509   PROTEINUR NEGATIVE 10/06/2016 1509   UROBILINOGEN 1.0 09/19/2014 1610   NITRITE NEGATIVE 10/06/2016 1509   LEUKOCYTESUR LARGE (A) 10/06/2016 1509    Microbiology Recent Results (from the past 240 hour(s))  Culture, blood (Routine X 2) w Reflex to ID Panel     Status: None (Preliminary result)   Collection Time: 10/06/16  2:20 PM  Result Value Ref Range Status   Specimen Description BLOOD RIGHT FOREARM  Final   Special Requests   Final    BOTTLES DRAWN AEROBIC AND ANAEROBIC Blood Culture adequate volume   Culture NO GROWTH 2 DAYS  Final   Report Status PENDING  Incomplete  Culture, blood (Routine X 2) w Reflex to ID Panel     Status: None (Preliminary result)   Collection Time: 10/06/16  2:33 PM  Result Value Ref Range Status   Specimen Description BLOOD RIGHT WRIST  Final   Special Requests IN PEDIATRIC BOTTLE Blood Culture adequate volume  Final   Culture NO GROWTH 2 DAYS  Final   Report Status PENDING  Incomplete  Urine culture     Status: Abnormal   Collection Time: 10/06/16  3:09 PM  Result Value Ref Range Status   Specimen Description URINE, CLEAN CATCH  Final   Special Requests Normal  Final   Culture 40,000 COLONIES/mL ESCHERICHIA COLI (A)  Final   Report Status 10/08/2016 FINAL  Final   Organism ID, Bacteria ESCHERICHIA COLI (A)  Final      Susceptibility   Escherichia coli - MIC*    AMPICILLIN >=32 RESISTANT Resistant     CEFAZOLIN <=4 SENSITIVE Sensitive     CEFTRIAXONE <=1 SENSITIVE Sensitive     CIPROFLOXACIN >=4 RESISTANT Resistant     GENTAMICIN >=16 RESISTANT Resistant      IMIPENEM <=0.25 SENSITIVE Sensitive     NITROFURANTOIN <=16 SENSITIVE Sensitive  TRIMETH/SULFA <=20 SENSITIVE Sensitive     AMPICILLIN/SULBACTAM 16 INTERMEDIATE Intermediate     PIP/TAZO <=4 SENSITIVE Sensitive     Extended ESBL NEGATIVE Sensitive     * 40,000 COLONIES/mL ESCHERICHIA COLI  MRSA PCR Screening     Status: None   Collection Time: 10/06/16  5:05 PM  Result Value Ref Range Status   MRSA by PCR NEGATIVE NEGATIVE Final    Comment:        The GeneXpert MRSA Assay (FDA approved for NASAL specimens only), is one component of a comprehensive MRSA colonization surveillance program. It is not intended to diagnose MRSA infection nor to guide or monitor treatment for MRSA infections.     Time coordinating discharge: Approximately 40 minutes  Vance Gather, MD  [facility] 10/09/2016, 11:20 AM Pager [phone]

## 2016-10-09 NOTE — Clinical Social Work Note (Signed)
Clinical Social Work Assessment  Patient Details  Name: Aaron Stephenson MRN: [id number] Date of Birth: 05/06/1924  Date of referral:  10/09/16               Reason for consult:  Facility Placement, Discharge Planning                Permission sought to share information with:  Facility Sport and exercise psychologist, Family Supports Permission granted to share information::  Yes, Verbal Permission Granted  Name::     Cammy Brochure  Agency::  [employer]  Relationship::  Daughters  Contact Information:     Housing/Transportation Living arrangements for the past 2 months:  [place] of Information:  Adult Children Patient Interpreter Needed:  None Criminal Activity/Legal Involvement Pertinent to Current Situation/Hospitalization:  No - Comment as needed Significant Relationships:  Adult Children Lives with:  Self, Facility Resident Do you feel safe going back to the place where you live?  Yes Need for family participation in patient care:  Yes (Comment) (patient has dementia)  Care giving concerns:  Patient has been residing at [facility] in the [place] unit and family has no concerns about care received there.   Social Worker assessment / plan:  [name] spoke with patient's daughter over the phone and met with the daughter at bedside to discuss discharge planning. Patient's daughter indicated preference for returning the patient back to his memory care unit, because the patient becomes increasingly more confused and debilitated with every facility transition that happens. Patient's daughter discussed how she just wants him to be back home where he's comfortable and familiar with his surroundings, and that was the most important thing for the family at this time. [name] spoke with patient's nurse over at [facility] and faxed over updated information to ensure that the facility is still able to accept him back. [name] will follow to facilitate discharge when  medically ready.  Employment status:  Retired Forensic scientist:  Medicare PT Recommendations:  [place] / Referral to community resources:     Patient/Family's Response to care:  Patient's daughters agreeable for patient to return to Memory Care unit at ALF.  Patient/Family's Understanding of and Emotional Response to Diagnosis, Current Treatment, and Prognosis:  Patient's daughters are aware that the patient has increased needs at this time, and that therapy is recommending SNF, but that they want the patient to be where he is comfortable and more aware of his surroundings. Patient's daughter acknowledged that the hardest thing is watching the patient be confused and disoriented; she just wants him to be comfortable, and moving him to a new facility for rehab does not interest the family at this time. Patient's daughter is appreciative of [name] assistance in returning the patient home.  Emotional Assessment Appearance:  Appears stated age Attitude/Demeanor/Rapport:  Unable to Assess Affect (typically observed):  Unable to Assess Orientation:  Oriented to Self, Oriented to Place, Oriented to Situation Alcohol / Substance use:  Not Applicable Psych involvement (Current and /or in the community):  No (Comment)  Discharge Needs  Concerns to be addressed:  Care Coordination, Discharge Planning Concerns Readmission within the last 30 days:  No Current discharge risk:  Physical Impairment, Cognitively Impaired Barriers to Discharge:  Continued Medical Work up   [employer], [place] 10/09/2016, 11:16 AM

## 2016-10-09 NOTE — Progress Notes (Signed)
Physical Therapy Treatment Patient Details Name: Aaron Stephenson MRN: [id number] DOB: 1924-06-27 Today's Date: 10/09/2016    History of Present Illness Patient is a 83 yo male admitted 10/06/16 from SNF after a fall backward, hitting head.  Patient with SDH - no surgery per NS.     PMH:  DM, HTN, Afib, HLD, CAD, NSTEMI, CKD, dementia    PT Comments    Pt progressing well towards goals. Pt able to amb this date but limited by R knee discomfort. Acute PT to con't to follow.   Follow Up Recommendations  SNF;Supervision for mobility/OOB (back to [facility])     Equipment Recommendations  None recommended by PT    Recommendations for Other Services       Precautions / Restrictions Precautions Precautions: Fall Precaution Comments: fall PTA Restrictions Weight Bearing Restrictions: No    Mobility  Bed Mobility Overal bed mobility: Needs Assistance Bed Mobility: Sit to Supine       Sit to supine: Min assist   General bed mobility comments: pt able to lay down and bring LEs back into bed. patient states "I haven't been able to bring my legs back in, in awhile"  Transfers Overall transfer level: Needs assistance Equipment used: Rolling walker (2 wheeled) Transfers: Sit to/from Stand Sit to Stand: Mod assist         General transfer comment: pt required assist for initial power up, v/c's to transition hands from chair to RW  Ambulation/Gait Ambulation/Gait assistance: Mod assist;+2 safety/equipment Ambulation Distance (Feet): 60 Feet (x2) Assistive device: Rolling walker (2 wheeled) Gait Pattern/deviations: Step-through pattern;Decreased stride length;Shuffle;Trunk flexed;Narrow base of support Gait velocity: slow Gait velocity interpretation: Below normal speed for age/gender General Gait Details: pt with R knee pain with antaligic gait pattern, modA for walker management and to stay in walker. pt with very narrow gait pattern near cross over. pt required 3 min rest  break and then was able to amb back to room   Stairs            Wheelchair Mobility    Modified Rankin (Stroke Patients Only)       Balance Overall balance assessment: Needs assistance;History of Falls Sitting-balance support: No upper extremity supported;Feet supported Sitting balance-Leahy Scale: Fair Sitting balance - Comments: sitting EOB   Standing balance support: Bilateral upper extremity supported Standing balance-Leahy Scale: Poor Standing balance comment: reliant on external support from therapist                            Cognition Arousal/Alertness: Awake/alert Behavior During Therapy: WFL for tasks assessed/performed Overall Cognitive Status: History of cognitive impairments - at baseline                                 General Comments: from memory unit at [place] Comments        Pertinent Vitals/Pain Pain Assessment: Faces Faces Pain Scale: Hurts little more Pain Location: R knee Pain Descriptors / Indicators: Aching Pain Intervention(s): Monitored during session    Home Living                      Prior Function            PT Goals (current goals can now be found in the care plan section) Progress towards PT  goals: Progressing toward goals    Frequency    Min 2X/week      PT Plan Current plan remains appropriate    Co-evaluation              AM-PAC PT "6 Clicks" Daily Activity  Outcome Measure  Difficulty turning over in bed (including adjusting bedclothes, sheets and blankets)?: Unable Difficulty moving from lying on back to sitting on the side of the bed? : A Little Difficulty sitting down on and standing up from a chair with arms (e.g., wheelchair, bedside commode, etc,.)?: A Lot Help needed moving to and from a bed to chair (including a wheelchair)?: A Little Help needed walking in hospital room?: A Lot Help needed climbing 3-5 steps with a  railing? : Total 6 Click Score: 12    End of Session Equipment Utilized During Treatment: Gait belt Activity Tolerance: Patient tolerated treatment well;Patient limited by fatigue Patient left: in bed;with call bell/phone within reach;with bed alarm set Nurse Communication: Mobility status PT Visit Diagnosis: Unsteadiness on feet (R26.81);Other abnormalities of gait and mobility (R26.89);Muscle weakness (generalized) (M62.81);History of falling (Z91.81);Pain Pain - Right/Left: Right Pain - part of body: Knee     Time: 3568-6168 PT Time Calculation (min) (ACUTE ONLY): 24 min  Charges:  $Gait Training: 23-37 mins                    G Codes:       Kittie Plater, PT, DPT Pager #: [phone] Office #: [phone]    [place] 10/09/2016, 11:32 AM

## 2016-10-09 NOTE — Care Management Note (Signed)
Case Management Note  Patient Details  Name: TAJON MORING MRN: [id number] Date of Birth: 26-Oct-1924  Subjective/Objective:                    Action/Plan: Pt discharging back to his ALF [facility]) today with [facility] services. Facility will arrange the [facility] therapies. Orders printed and faxed to the facility per [name].  No further needs per CM.   Expected Discharge Date:  10/09/16               Expected Discharge Plan:  Assisted Living / Rest Home  In-House Referral:  Clinical Social Work  Discharge planning Services  CM Consult  Post Acute Care Choice:  Home Health Choice offered to:   (facility)  DME Arranged:    DME Agency:     HH Arranged:  PT, OT [place] Agency:   (facility to arrange)  Status of Service:  Completed, signed off  If discussed at [place] of Stay Meetings, dates discussed:    Additional Comments:  Pollie Friar, RN 10/09/2016, 1:26 PM

## 2016-10-10 DIAGNOSIS — E785 Hyperlipidemia, unspecified: Secondary | ICD-10-CM | POA: Diagnosis not present

## 2016-10-10 DIAGNOSIS — K4090 Unilateral inguinal hernia, without obstruction or gangrene, not specified as recurrent: Secondary | ICD-10-CM | POA: Diagnosis not present

## 2016-10-10 DIAGNOSIS — R2689 Other abnormalities of gait and mobility: Secondary | ICD-10-CM | POA: Diagnosis not present

## 2016-10-10 DIAGNOSIS — E119 Type 2 diabetes mellitus without complications: Secondary | ICD-10-CM | POA: Diagnosis not present

## 2016-10-10 DIAGNOSIS — I4891 Unspecified atrial fibrillation: Secondary | ICD-10-CM | POA: Diagnosis not present

## 2016-10-10 DIAGNOSIS — I129 Hypertensive chronic kidney disease with stage 1 through stage 4 chronic kidney disease, or unspecified chronic kidney disease: Secondary | ICD-10-CM | POA: Diagnosis not present

## 2016-10-10 DIAGNOSIS — F0390 Unspecified dementia without behavioral disturbance: Secondary | ICD-10-CM | POA: Diagnosis not present

## 2016-10-10 DIAGNOSIS — I2510 Atherosclerotic heart disease of native coronary artery without angina pectoris: Secondary | ICD-10-CM | POA: Diagnosis not present

## 2016-10-10 DIAGNOSIS — E1122 Type 2 diabetes mellitus with diabetic chronic kidney disease: Secondary | ICD-10-CM | POA: Diagnosis not present

## 2016-10-10 DIAGNOSIS — N183 Chronic kidney disease, stage 3 (moderate): Secondary | ICD-10-CM | POA: Diagnosis not present

## 2016-10-10 DIAGNOSIS — N400 Enlarged prostate without lower urinary tract symptoms: Secondary | ICD-10-CM | POA: Diagnosis not present

## 2016-10-10 DIAGNOSIS — I10 Essential (primary) hypertension: Secondary | ICD-10-CM | POA: Diagnosis not present

## 2016-10-10 DIAGNOSIS — I872 Venous insufficiency (chronic) (peripheral): Secondary | ICD-10-CM | POA: Diagnosis not present

## 2016-10-11 DIAGNOSIS — I872 Venous insufficiency (chronic) (peripheral): Secondary | ICD-10-CM | POA: Diagnosis not present

## 2016-10-11 DIAGNOSIS — I129 Hypertensive chronic kidney disease with stage 1 through stage 4 chronic kidney disease, or unspecified chronic kidney disease: Secondary | ICD-10-CM | POA: Diagnosis not present

## 2016-10-11 DIAGNOSIS — N183 Chronic kidney disease, stage 3 (moderate): Secondary | ICD-10-CM | POA: Diagnosis not present

## 2016-10-11 DIAGNOSIS — E1122 Type 2 diabetes mellitus with diabetic chronic kidney disease: Secondary | ICD-10-CM | POA: Diagnosis not present

## 2016-10-11 DIAGNOSIS — I2510 Atherosclerotic heart disease of native coronary artery without angina pectoris: Secondary | ICD-10-CM | POA: Diagnosis not present

## 2016-10-11 DIAGNOSIS — F0390 Unspecified dementia without behavioral disturbance: Secondary | ICD-10-CM | POA: Diagnosis not present

## 2016-10-11 LAB — CULTURE, BLOOD (ROUTINE X 2)
Culture: NO GROWTH
Culture: NO GROWTH
SPECIAL REQUESTS: ADEQUATE
Special Requests: ADEQUATE

## 2016-10-12 DIAGNOSIS — E1122 Type 2 diabetes mellitus with diabetic chronic kidney disease: Secondary | ICD-10-CM | POA: Diagnosis not present

## 2016-10-12 DIAGNOSIS — I129 Hypertensive chronic kidney disease with stage 1 through stage 4 chronic kidney disease, or unspecified chronic kidney disease: Secondary | ICD-10-CM | POA: Diagnosis not present

## 2016-10-12 DIAGNOSIS — N183 Chronic kidney disease, stage 3 (moderate): Secondary | ICD-10-CM | POA: Diagnosis not present

## 2016-10-12 DIAGNOSIS — F0390 Unspecified dementia without behavioral disturbance: Secondary | ICD-10-CM | POA: Diagnosis not present

## 2016-10-12 DIAGNOSIS — I2510 Atherosclerotic heart disease of native coronary artery without angina pectoris: Secondary | ICD-10-CM | POA: Diagnosis not present

## 2016-10-12 DIAGNOSIS — I872 Venous insufficiency (chronic) (peripheral): Secondary | ICD-10-CM | POA: Diagnosis not present

## 2016-10-13 DIAGNOSIS — E1122 Type 2 diabetes mellitus with diabetic chronic kidney disease: Secondary | ICD-10-CM | POA: Diagnosis not present

## 2016-10-13 DIAGNOSIS — I129 Hypertensive chronic kidney disease with stage 1 through stage 4 chronic kidney disease, or unspecified chronic kidney disease: Secondary | ICD-10-CM | POA: Diagnosis not present

## 2016-10-13 DIAGNOSIS — I2510 Atherosclerotic heart disease of native coronary artery without angina pectoris: Secondary | ICD-10-CM | POA: Diagnosis not present

## 2016-10-13 DIAGNOSIS — I872 Venous insufficiency (chronic) (peripheral): Secondary | ICD-10-CM | POA: Diagnosis not present

## 2016-10-13 DIAGNOSIS — R05 Cough: Secondary | ICD-10-CM | POA: Diagnosis not present

## 2016-10-13 DIAGNOSIS — N183 Chronic kidney disease, stage 3 (moderate): Secondary | ICD-10-CM | POA: Diagnosis not present

## 2016-10-13 DIAGNOSIS — F0390 Unspecified dementia without behavioral disturbance: Secondary | ICD-10-CM | POA: Diagnosis not present

## 2016-10-14 ENCOUNTER — Other Ambulatory Visit: Payer: Self-pay | Admitting: Nurse Practitioner

## 2016-10-16 DIAGNOSIS — N183 Chronic kidney disease, stage 3 (moderate): Secondary | ICD-10-CM | POA: Diagnosis not present

## 2016-10-16 DIAGNOSIS — E1122 Type 2 diabetes mellitus with diabetic chronic kidney disease: Secondary | ICD-10-CM | POA: Diagnosis not present

## 2016-10-16 DIAGNOSIS — I872 Venous insufficiency (chronic) (peripheral): Secondary | ICD-10-CM | POA: Diagnosis not present

## 2016-10-16 DIAGNOSIS — I2510 Atherosclerotic heart disease of native coronary artery without angina pectoris: Secondary | ICD-10-CM | POA: Diagnosis not present

## 2016-10-16 DIAGNOSIS — F0390 Unspecified dementia without behavioral disturbance: Secondary | ICD-10-CM | POA: Diagnosis not present

## 2016-10-16 DIAGNOSIS — I129 Hypertensive chronic kidney disease with stage 1 through stage 4 chronic kidney disease, or unspecified chronic kidney disease: Secondary | ICD-10-CM | POA: Diagnosis not present

## 2016-10-17 DIAGNOSIS — R931 Abnormal findings on diagnostic imaging of heart and coronary circulation: Secondary | ICD-10-CM | POA: Diagnosis not present

## 2016-10-18 DIAGNOSIS — N183 Chronic kidney disease, stage 3 (moderate): Secondary | ICD-10-CM | POA: Diagnosis not present

## 2016-10-18 DIAGNOSIS — I129 Hypertensive chronic kidney disease with stage 1 through stage 4 chronic kidney disease, or unspecified chronic kidney disease: Secondary | ICD-10-CM | POA: Diagnosis not present

## 2016-10-18 DIAGNOSIS — F0390 Unspecified dementia without behavioral disturbance: Secondary | ICD-10-CM | POA: Diagnosis not present

## 2016-10-18 DIAGNOSIS — E1122 Type 2 diabetes mellitus with diabetic chronic kidney disease: Secondary | ICD-10-CM | POA: Diagnosis not present

## 2016-10-18 DIAGNOSIS — I2510 Atherosclerotic heart disease of native coronary artery without angina pectoris: Secondary | ICD-10-CM | POA: Diagnosis not present

## 2016-10-18 DIAGNOSIS — I872 Venous insufficiency (chronic) (peripheral): Secondary | ICD-10-CM | POA: Diagnosis not present

## 2016-10-19 DIAGNOSIS — N183 Chronic kidney disease, stage 3 (moderate): Secondary | ICD-10-CM | POA: Diagnosis not present

## 2016-10-19 DIAGNOSIS — F0390 Unspecified dementia without behavioral disturbance: Secondary | ICD-10-CM | POA: Diagnosis not present

## 2016-10-19 DIAGNOSIS — E1122 Type 2 diabetes mellitus with diabetic chronic kidney disease: Secondary | ICD-10-CM | POA: Diagnosis not present

## 2016-10-19 DIAGNOSIS — I872 Venous insufficiency (chronic) (peripheral): Secondary | ICD-10-CM | POA: Diagnosis not present

## 2016-10-19 DIAGNOSIS — I129 Hypertensive chronic kidney disease with stage 1 through stage 4 chronic kidney disease, or unspecified chronic kidney disease: Secondary | ICD-10-CM | POA: Diagnosis not present

## 2016-10-19 DIAGNOSIS — I2510 Atherosclerotic heart disease of native coronary artery without angina pectoris: Secondary | ICD-10-CM | POA: Diagnosis not present

## 2016-10-20 DIAGNOSIS — F0390 Unspecified dementia without behavioral disturbance: Secondary | ICD-10-CM | POA: Diagnosis not present

## 2016-10-20 DIAGNOSIS — I872 Venous insufficiency (chronic) (peripheral): Secondary | ICD-10-CM | POA: Diagnosis not present

## 2016-10-20 DIAGNOSIS — N183 Chronic kidney disease, stage 3 (moderate): Secondary | ICD-10-CM | POA: Diagnosis not present

## 2016-10-20 DIAGNOSIS — I2510 Atherosclerotic heart disease of native coronary artery without angina pectoris: Secondary | ICD-10-CM | POA: Diagnosis not present

## 2016-10-20 DIAGNOSIS — E1122 Type 2 diabetes mellitus with diabetic chronic kidney disease: Secondary | ICD-10-CM | POA: Diagnosis not present

## 2016-10-20 DIAGNOSIS — R300 Dysuria: Secondary | ICD-10-CM | POA: Diagnosis not present

## 2016-10-20 DIAGNOSIS — I129 Hypertensive chronic kidney disease with stage 1 through stage 4 chronic kidney disease, or unspecified chronic kidney disease: Secondary | ICD-10-CM | POA: Diagnosis not present

## 2016-10-23 ENCOUNTER — Other Ambulatory Visit: Payer: Self-pay | Admitting: Physician Assistant

## 2016-10-23 DIAGNOSIS — I2510 Atherosclerotic heart disease of native coronary artery without angina pectoris: Secondary | ICD-10-CM | POA: Diagnosis not present

## 2016-10-23 DIAGNOSIS — S065X9A Traumatic subdural hemorrhage with loss of consciousness of unspecified duration, initial encounter: Secondary | ICD-10-CM

## 2016-10-23 DIAGNOSIS — E668 Other obesity: Secondary | ICD-10-CM | POA: Diagnosis not present

## 2016-10-23 DIAGNOSIS — N183 Chronic kidney disease, stage 3 (moderate): Secondary | ICD-10-CM | POA: Diagnosis not present

## 2016-10-23 DIAGNOSIS — E784 Other hyperlipidemia: Secondary | ICD-10-CM | POA: Diagnosis not present

## 2016-10-23 DIAGNOSIS — S065XAA Traumatic subdural hemorrhage with loss of consciousness status unknown, initial encounter: Secondary | ICD-10-CM

## 2016-10-23 DIAGNOSIS — I119 Hypertensive heart disease without heart failure: Secondary | ICD-10-CM | POA: Diagnosis not present

## 2016-10-23 DIAGNOSIS — E1121 Type 2 diabetes mellitus with diabetic nephropathy: Secondary | ICD-10-CM | POA: Diagnosis not present

## 2016-10-23 DIAGNOSIS — K219 Gastro-esophageal reflux disease without esophagitis: Secondary | ICD-10-CM | POA: Diagnosis not present

## 2016-10-23 DIAGNOSIS — D45 Polycythemia vera: Secondary | ICD-10-CM | POA: Diagnosis not present

## 2016-10-24 DIAGNOSIS — N183 Chronic kidney disease, stage 3 (moderate): Secondary | ICD-10-CM | POA: Diagnosis not present

## 2016-10-24 DIAGNOSIS — I10 Essential (primary) hypertension: Secondary | ICD-10-CM | POA: Diagnosis not present

## 2016-10-24 DIAGNOSIS — I2510 Atherosclerotic heart disease of native coronary artery without angina pectoris: Secondary | ICD-10-CM | POA: Diagnosis not present

## 2016-10-24 DIAGNOSIS — F0390 Unspecified dementia without behavioral disturbance: Secondary | ICD-10-CM | POA: Diagnosis not present

## 2016-10-24 DIAGNOSIS — R609 Edema, unspecified: Secondary | ICD-10-CM | POA: Diagnosis not present

## 2016-10-24 DIAGNOSIS — I129 Hypertensive chronic kidney disease with stage 1 through stage 4 chronic kidney disease, or unspecified chronic kidney disease: Secondary | ICD-10-CM | POA: Diagnosis not present

## 2016-10-24 DIAGNOSIS — E785 Hyperlipidemia, unspecified: Secondary | ICD-10-CM | POA: Diagnosis not present

## 2016-10-24 DIAGNOSIS — I4891 Unspecified atrial fibrillation: Secondary | ICD-10-CM | POA: Diagnosis not present

## 2016-10-24 DIAGNOSIS — E119 Type 2 diabetes mellitus without complications: Secondary | ICD-10-CM | POA: Diagnosis not present

## 2016-10-24 DIAGNOSIS — N400 Enlarged prostate without lower urinary tract symptoms: Secondary | ICD-10-CM | POA: Diagnosis not present

## 2016-10-24 DIAGNOSIS — K4090 Unilateral inguinal hernia, without obstruction or gangrene, not specified as recurrent: Secondary | ICD-10-CM | POA: Diagnosis not present

## 2016-10-24 DIAGNOSIS — I872 Venous insufficiency (chronic) (peripheral): Secondary | ICD-10-CM | POA: Diagnosis not present

## 2016-10-24 DIAGNOSIS — E1122 Type 2 diabetes mellitus with diabetic chronic kidney disease: Secondary | ICD-10-CM | POA: Diagnosis not present

## 2016-10-25 DIAGNOSIS — I129 Hypertensive chronic kidney disease with stage 1 through stage 4 chronic kidney disease, or unspecified chronic kidney disease: Secondary | ICD-10-CM | POA: Diagnosis not present

## 2016-10-25 DIAGNOSIS — N183 Chronic kidney disease, stage 3 (moderate): Secondary | ICD-10-CM | POA: Diagnosis not present

## 2016-10-25 DIAGNOSIS — I872 Venous insufficiency (chronic) (peripheral): Secondary | ICD-10-CM | POA: Diagnosis not present

## 2016-10-25 DIAGNOSIS — F0390 Unspecified dementia without behavioral disturbance: Secondary | ICD-10-CM | POA: Diagnosis not present

## 2016-10-25 DIAGNOSIS — E1122 Type 2 diabetes mellitus with diabetic chronic kidney disease: Secondary | ICD-10-CM | POA: Diagnosis not present

## 2016-10-25 DIAGNOSIS — I2510 Atherosclerotic heart disease of native coronary artery without angina pectoris: Secondary | ICD-10-CM | POA: Diagnosis not present

## 2016-10-26 ENCOUNTER — Ambulatory Visit (INDEPENDENT_AMBULATORY_CARE_PROVIDER_SITE_OTHER): Payer: Medicare Other | Admitting: Internal Medicine

## 2016-10-26 ENCOUNTER — Encounter: Payer: Self-pay | Admitting: Internal Medicine

## 2016-10-26 VITALS — BP 104/70 | HR 60 | Ht 64.0 in | Wt 154.0 lb

## 2016-10-26 DIAGNOSIS — F0390 Unspecified dementia without behavioral disturbance: Secondary | ICD-10-CM | POA: Diagnosis not present

## 2016-10-26 DIAGNOSIS — I110 Hypertensive heart disease with heart failure: Secondary | ICD-10-CM | POA: Diagnosis not present

## 2016-10-26 DIAGNOSIS — J189 Pneumonia, unspecified organism: Secondary | ICD-10-CM

## 2016-10-26 DIAGNOSIS — R05 Cough: Secondary | ICD-10-CM

## 2016-10-26 DIAGNOSIS — I129 Hypertensive chronic kidney disease with stage 1 through stage 4 chronic kidney disease, or unspecified chronic kidney disease: Secondary | ICD-10-CM | POA: Diagnosis not present

## 2016-10-26 DIAGNOSIS — I5032 Chronic diastolic (congestive) heart failure: Secondary | ICD-10-CM

## 2016-10-26 DIAGNOSIS — I25118 Atherosclerotic heart disease of native coronary artery with other forms of angina pectoris: Secondary | ICD-10-CM | POA: Diagnosis not present

## 2016-10-26 DIAGNOSIS — I2510 Atherosclerotic heart disease of native coronary artery without angina pectoris: Secondary | ICD-10-CM | POA: Diagnosis not present

## 2016-10-26 DIAGNOSIS — N183 Chronic kidney disease, stage 3 (moderate): Secondary | ICD-10-CM | POA: Diagnosis not present

## 2016-10-26 DIAGNOSIS — E1122 Type 2 diabetes mellitus with diabetic chronic kidney disease: Secondary | ICD-10-CM | POA: Diagnosis not present

## 2016-10-26 DIAGNOSIS — I872 Venous insufficiency (chronic) (peripheral): Secondary | ICD-10-CM | POA: Diagnosis not present

## 2016-10-26 DIAGNOSIS — R058 Other specified cough: Secondary | ICD-10-CM

## 2016-10-26 MED ORDER — LOSARTAN POTASSIUM 25 MG PO TABS: 25.0000 mg | ORAL_TABLET | Freq: Every day | ORAL | Status: AC

## 2016-10-26 NOTE — Patient Instructions (Addendum)
Stop prinivil and start losartan 25 mg daily instead  Start GERD (REFLUX)  is an extremely common cause of respiratory symptoms just like yours , many times with no obvious heartburn at all.    It can be treated with medication, but also with lifestyle changes including elevation of the head of your bed (ideally with 6 inch  bed blocks),  Smoking cessation, avoidance of late meals, excessive alcohol, and avoid fatty foods, chocolate, peppermint, colas, red wine, and acidic juices such as orange juice.  NO MINT OR MENTHOL PRODUCTS SO NO COUGH DROPS  USE SUGARLESS CANDY INSTEAD (Jolley ranchers or Stover's or Life Savers) or even ice chips will also do - the key is to swallow to prevent all throat clearing. NO OIL BASED VITAMINS - use powdered substitutes.    Resume your dysphagia 3 diet  Dysphagia 3 (Mech soft) solids;Thin liquid   Liquid Administration via Cup;Straw  Medication Administration Whole meds with liquid  Compensations Slow rate;Small sips/bites  Postural Changes Remain semi-upright after after feeds/meals (Comment);Seated upright at 90 degrees    Follow up is as needed

## 2016-10-26 NOTE — Progress Notes (Signed)
Subjective:     Patient ID: Aaron Stephenson, male   DOB: March 07, 1924,    MRN: [id number]  HPI  83 yowm never smoker s/p admit:   Admit date: 10/06/2016 Discharge date: 10/09/2016  Admitted From: [place] Disposition: [place]   Recommendations for Outpatient Follow-up:  1. Follow up with cardiology in next 2 weeks. Discharged to continue torsemide 10mg  daily.  2. Continue keppra ppx x7 days per neurosurgery, who recommends no further interventions for traumatic SDH. 3. Holding eliquis for at least 4 weeks pending repeat CT head as outpatient.  4. Monitor BP, holding imdur.   Home Health: Resume PT Equipment/Devices: None new Discharge Condition: Stable CODE STATUS: DNR Diet recommendation: Heart healthy, carb-modified  Brief/Interim Summary: Aaron Stephenson a 83 y.o.male with a history of dementia, T2DM, AFib on eliquis,HLD, CAD, and recent Salmonella sepsis who presented to the ED 8/24 after a fall at [facility] He reports arising from bed, walking to the bathroom, preparing to urinate, and suddenly feeling lightheaded when his right knee gave out and he fell backward hitting his head without LOC. On arrival, neuro exam was nonfocal. CT head demonstrated a 7 mm subdural hematoma arising from the falx. 1 dose of K-centra was given to reverse eliquis and the patient was admitted for neuro observation and work up of syncope. Repeat CT scan showed improvement in SDH and neurosurgery signed off, recommending continuing keppra for seizure prophylaxis, and holding eliquis at least until repeat CT in 4 weeks.   Discharge Diagnoses: :   Polycythemia vera ([place])   NSTEMI (non-ST elevated myocardial infarction) ([place])   Type 2 diabetes mellitus with renal manifestations (HCC)   Hyperlipidemia   Kidney disease, chronic, stage III (GFR 30-59 ml/min)   Chronic venous insufficiency   Abnormal cardiovascular function study   Benign prostatic  hyperplasia   GERD (gastroesophageal reflux disease)   Primary osteoarthritis of both knees   Hypertensive heart disease with congestive heart failure (HCC)   Chronic systolic CHF (congestive heart failure) ([place])   Coronary artery disease   Syncope  Fall: Multifactorial including micturition syncope, mild weakness/debility (receiving HH-PT), significant osteoarthritis, and possibly relative hypotension (recently started torsemide). Labs, EKG unrevealing. Neuro exam unremarkable. Last echo 08/2016 EF40-45%, updated echo 10/08/2016 shows improved EF to 50-55% with grade 3 diastolic dysfunction and changes consistent with pulmonary HTN. - Continue fall precautions. - Monitor blood cultures. Urine culture with 40k GNRs, no treatment indicated.  - Recommend monitoring BP closely and strongly considering decreasing anti-hypertensives if hypotensive. Cotninue lisinopril, metoprolol and torsemide. - Hold imdur until follow up. - Minimize sedating medications.  - PT evaluation recommended SNF, family and patient wish to return to Memory Care Unit with home health PT.   Subdural hematoma: Intrahemispheric, shows evidence of resolution on repeat CT 8/25.  - Continue keppra ppx x7 days per neurosurgery (5 more days), who recommends no further interventions - Holding eliquis for at least 4 weeks pending repeat CT head as outpatient.   Paroxysmal atrial fibrillation: Recent Dx in setting of sepsis. Currently NSR.  - Continue metoprolol - Holding eliquis. Would personally recommend never restarting this due to fall risk and now SDH.   Elevated troponin: Mild, flat trend, no chest pain, nonischemic ECG.  - No wall motion abnormalities or chest pain.  - Follow up with cardiology.  T2DM: Controlled actually belowgoal for his age with HbA1c 5.2% - Restart medications, but consider stopping these.  Essential HTN: Chronic, has been hypotensive.  -  Holding imdur  Hyperlipidemia: Chronic, stable.   - Continue home statin, would recommend discontinuing statin due to age and concurrent dementia.   Chronic diastolic CHF: EF improved by recent echocardiogram, but has grade 3 diastolic dysfunction. Currently compensated.  - Legs became more edematous with holding torsemide x1 day, so this was restarted at low dose.  - Medications as above.   Acute kidney injury on CKD stage III: Baseline Cr ~1.3, was 1.97 on admission. Recently started torsemide, and BUN/Cr appears prerenal consistent with dehydration, possibly contributing to fall. Also possibly related to bactrim, course completed. - Held lisinopril with improvement to 1.65 at discharge.  - Monitor BMP   Polycythemia vera: +JAK mutation. Chronic, stable, currently with normal hgb.  - Outpatient follow up.   Thrombocytopenia: Chronic, stable.  - SDH is stable, no transfusion indicated.  History of prostate CA:  - Continue finasteride, flomax, tolterodine  GERD: Chronic, stable.  - Continue PPI  Osteoarthritis: Diffuse, significant in knees.  - Continue orthopedic treatments as outpatient.   10/26/2016 [facility] office visit/ [place]   Chief Complaint  Patient presents with  . Pulmonary Consult    Referred by Anders Grant for eval of persistant lung opacity. He has occ cough with grey sputum.    back near baseline functional status but bothered by cough "for a long time" and sense of globus/ mild dysphagia and not observing rec diet per last ST eval on record (though may have been done more recently than 08/15/16- pt and daughter not sure)  - minimal mucus production during the day, doesn't disturb sleep  Not limited by breathing from desired activities  And able to walk in halls with walker, slowed by geriatric decline/balance > sob  No obvious day to day or daytime variability or assoc  mucus plugs or hemoptysis or cp or chest tightness, subjective wheeze or overt sinus or hb symptoms. No unusual exp hx or h/o  childhood pna/ asthma or knowledge of premature birth.  Sleeping ok flat without nocturnal  or early am exacerbation  of respiratory  c/o's or need for noct saba. Also denies any obvious fluctuation of symptoms with weather or environmental changes or other aggravating or alleviating factors except as outlined above   Current Allergies, Complete Past Medical History, Past Surgical History, Family History, and Social History were reviewed in [employer] record.  ROS  The following are not active complaints unless bolded sore throat, dysphagia, dental problems, itching, sneezing,  nasal congestion or disharge of excess mucus or purulent secretions, ear ache,   fever, chills, sweats, unintended wt loss or wt gain, classically pleuritic or exertional cp,  orthopnea pnd or leg swelling, presyncope, palpitations, abdominal pain, anorexia, nausea, vomiting, diarrhea  or change in bowel habits or bladder habits, change in stools or change in urine, dysuria, hematuria,  rash, arthralgias, visual complaints, headache, numbness, weakness or ataxia or problems with walking or coordination,  change in mood/affect or memory.        Current Meds  Medication Sig  . acetaminophen (TYLENOL) 650 MG CR tablet Take 650 mg by mouth 3 (three) times daily.   Marland Kitchen apixaban (ELIQUIS) 2.5 MG TABS tablet Take 2.5 mg by mouth 2 (two) times daily.  Marland Kitchen atorvastatin (LIPITOR) 10 MG tablet Take 10 mg by mouth at bedtime.  . docusate sodium (COLACE) 100 MG capsule Take 100 mg by mouth daily.  . finasteride (PROSCAR) 5 MG tablet Take 5 mg by mouth at bedtime.   Marland Kitchen  glipiZIDE (GLUCOTROL XL) 5 MG 24 hr tablet Take 5 mg by mouth daily.  . Guaifenesin 1200 MG TB12 Take 1 tablet by mouth 2 (two) times daily.  Marland Kitchen levETIRAcetam (KEPPRA) 500 MG tablet Take 1 tablet (500 mg total) by mouth 2 (two) times daily.  . metoprolol tartrate (LOPRESSOR) 25 MG tablet Take 25 mg by mouth 2 (two) times daily.  . mirtazapine (REMERON)  15 MG tablet Take 15 mg by mouth at bedtime.  . nitroGLYCERIN (NITROSTAT) 0.4 MG SL tablet Place 1 tablet (0.4 mg total) under the tongue every 5 (five) minutes x 3 doses as needed for chest pain.  Marland Kitchen sulfamethoxazole-trimethoprim (BACTRIM DS,SEPTRA DS) 800-160 MG tablet Take 1 tablet by mouth 2 (two) times daily.  . tamsulosin (FLOMAX) 0.4 MG CAPS capsule Take 0.4 mg by mouth 2 (two) times daily.   Marland Kitchen tolterodine (DETROL LA) 4 MG 24 hr capsule Take 4 mg by mouth at bedtime.  . torsemide (DEMADEX) 10 MG tablet Take 10 mg by mouth daily.  . [DISCONTINUED] lisinopril (PRINIVIL,ZESTRIL) 5 MG tablet Take 5 mg by mouth at bedtime.       Review of Systems     Objective:   Physical Exam  amb wm nad/ using rolling walker/ very hoarse   Wt Readings from Last 3 Encounters:  10/26/16 154 lb (69.9 kg)  10/07/16 170 lb (77.1 kg)  09/04/16 165 lb 6.4 oz (75 kg)    Vital signs reviewed  - Note on arrival 02 sats  93% on RA     HEENT: nl dentition, turbinates bilaterally, and oropharynx. Nl external ear canals without cough reflex   NECK :  without JVD/Nodes/TM/ nl carotid upstrokes bilaterally   LUNGS: no acc muscle use,  Nl contour chest with minimal insp and exp rhonchi bilaterally s assoc cough    CV:  RRR  no s3 or murmur or increase in P2, and no edema   ABD:  soft and nontender with nl inspiratory excursion in the supine position. No bruits or organomegaly appreciated, bowel sounds nl  MS:  ext warm without deformities, calf tenderness, cyanosis or clubbing No obvious joint restrictions - walks slow shuffling gait bent over rolling walker   SKIN: warm and dry without lesions    NEURO:  alert, approp,  no motor or cerebellar deficits apparent - thinks it's the 1950's / does not know day of week/ "that millionaire from [place] is president"     I personally reviewed images and agree with radiology impression as follows:  CXR:   10/06/16 Small bilateral pleural effusions, larger on the  left.  Mixed interstitial and airspace opacity at the right lung base, which appears new from the abdominal CT dated 08/13/2016. Leading differential includes atelectasis/ scarring and/or pneumonitis/pneumonia.        Assessment:

## 2016-10-27 DIAGNOSIS — I129 Hypertensive chronic kidney disease with stage 1 through stage 4 chronic kidney disease, or unspecified chronic kidney disease: Secondary | ICD-10-CM | POA: Diagnosis not present

## 2016-10-27 DIAGNOSIS — E1122 Type 2 diabetes mellitus with diabetic chronic kidney disease: Secondary | ICD-10-CM | POA: Diagnosis not present

## 2016-10-27 DIAGNOSIS — R05 Cough: Secondary | ICD-10-CM | POA: Insufficient documentation

## 2016-10-27 DIAGNOSIS — F0390 Unspecified dementia without behavioral disturbance: Secondary | ICD-10-CM | POA: Diagnosis not present

## 2016-10-27 DIAGNOSIS — I872 Venous insufficiency (chronic) (peripheral): Secondary | ICD-10-CM | POA: Diagnosis not present

## 2016-10-27 DIAGNOSIS — N183 Chronic kidney disease, stage 3 (moderate): Secondary | ICD-10-CM | POA: Diagnosis not present

## 2016-10-27 DIAGNOSIS — I2510 Atherosclerotic heart disease of native coronary artery without angina pectoris: Secondary | ICD-10-CM | POA: Diagnosis not present

## 2016-10-27 DIAGNOSIS — R058 Other specified cough: Secondary | ICD-10-CM | POA: Insufficient documentation

## 2016-10-27 NOTE — Assessment & Plan Note (Signed)
10/26/2016  REC;  Trial off acei/ resume   III diet    Upper airway cough syndrome (previously labeled PNDS) , is  so named because it's frequently impossible to sort out how much is  CR/sinusitis with freq throat clearing (which can be related to primary GERD)   vs  causing  secondary (" extra esophageal")  GERD from wide swings in gastric pressure that occur with throat clearing, often  promoting self use of mint and menthol lozenges that reduce the lower esophageal sphincter tone and exacerbate the problem further in a cyclical fashion.   These are the same pts (now being labeled as having "irritable larynx syndrome" by some cough centers) who not infrequently have a history of having failed to tolerate ace inhibitors,  dry powder inhalers or biphosphonates or report having atypical/extraesophageal reflux symptoms that don't respond to standard doses of PPI  and are easily confused as having aecopd or asthma flares by even experienced allergists/ pulmonologists (myself included).    Would start with eliminating ACEi x 6 weeeks and if not improved then add  Pantoprazole (protonix) 40 mg   Take  30-60 min before first meal of the day and Pepcid (famotidine)  20 mg one @  Bedtime x 4 week trial   In meantime would also resume DIII diet as per last ST recs as likely the UACS plus neuro issues are leading to recurrent aspiration (see separate a/p)   Total time devoted to counseling  > 50 % of initial 60 min office visit:  review case with pt/daughter discussion of options/alternatives/ personally creating written customized instructions  in presence of pt  then going over those specific  Instructions directly with the pt including how to use all of the meds but in particular covering each new medication in detail and the difference between the maintenance= "automatic" meds and the prns using an action plan format for the latter (If this problem/symptom => do that organization reading Left to right).   Please see AVS from this visit for a full list of these instructions which I personally wrote for this pt and  are unique to this visit.

## 2016-10-27 NOTE — Assessment & Plan Note (Signed)
Trial off acei 10/26/2016 due to uacs   In the best review of chronic cough to date ( NEJM 2016 375 [id number]) ,  ACEi are now felt to cause cough in up to  20% of pts which is a 4 fold increase from previous reports and does not include the variety of non-specific complaints we see in pulmonary clinic in pts on ACEi but previously attributed to another dx like  Copd/asthma and  include PNDS, throat and chest congestion, "bronchitis", unexplained dyspnea and noct "strangling" sensations, and hoarseness, but also  atypical /refractory GERD symptoms like dysphagia and "bad heartburn"   The only way I know  to prove this is not an "ACEi Case" is a trial off ACEi x a minimum of 6 weeks then regroup.   Since bp low and creat clearance an issue rec try losartan 25 mg daily and f/u PCP if better, here if not   see avs for instructions unique to this ov

## 2016-10-27 NOTE — Assessment & Plan Note (Addendum)
Probably having recurrent low grade aspiration with pneumonitis affecting dependent segments/ lower lobes and perhaps component of  diastolic dysfunction/chf   mimicking HCAP but in absence of fever or def purulent sputum would hold abx and only do f/u cxr if there is a definite clinical change for the worse - otherwise tendency to overtreat and create more resistant bact or c diff issues  Would resume D III diet until off acei x 6 weeks then have another ST eval completed to decide best diet going forward.

## 2016-10-31 DIAGNOSIS — I10 Essential (primary) hypertension: Secondary | ICD-10-CM | POA: Diagnosis not present

## 2016-10-31 DIAGNOSIS — N183 Chronic kidney disease, stage 3 (moderate): Secondary | ICD-10-CM | POA: Diagnosis not present

## 2016-10-31 DIAGNOSIS — E1122 Type 2 diabetes mellitus with diabetic chronic kidney disease: Secondary | ICD-10-CM | POA: Diagnosis not present

## 2016-10-31 DIAGNOSIS — N400 Enlarged prostate without lower urinary tract symptoms: Secondary | ICD-10-CM | POA: Diagnosis not present

## 2016-10-31 DIAGNOSIS — F0390 Unspecified dementia without behavioral disturbance: Secondary | ICD-10-CM | POA: Diagnosis not present

## 2016-10-31 DIAGNOSIS — R319 Hematuria, unspecified: Secondary | ICD-10-CM | POA: Diagnosis not present

## 2016-10-31 DIAGNOSIS — I4891 Unspecified atrial fibrillation: Secondary | ICD-10-CM | POA: Diagnosis not present

## 2016-10-31 DIAGNOSIS — E785 Hyperlipidemia, unspecified: Secondary | ICD-10-CM | POA: Diagnosis not present

## 2016-10-31 DIAGNOSIS — I2510 Atherosclerotic heart disease of native coronary artery without angina pectoris: Secondary | ICD-10-CM | POA: Diagnosis not present

## 2016-10-31 DIAGNOSIS — I872 Venous insufficiency (chronic) (peripheral): Secondary | ICD-10-CM | POA: Diagnosis not present

## 2016-10-31 DIAGNOSIS — R609 Edema, unspecified: Secondary | ICD-10-CM | POA: Diagnosis not present

## 2016-10-31 DIAGNOSIS — I129 Hypertensive chronic kidney disease with stage 1 through stage 4 chronic kidney disease, or unspecified chronic kidney disease: Secondary | ICD-10-CM | POA: Diagnosis not present

## 2016-10-31 DIAGNOSIS — K4090 Unilateral inguinal hernia, without obstruction or gangrene, not specified as recurrent: Secondary | ICD-10-CM | POA: Diagnosis not present

## 2016-11-01 DIAGNOSIS — G8929 Other chronic pain: Secondary | ICD-10-CM | POA: Diagnosis not present

## 2016-11-01 DIAGNOSIS — M1712 Unilateral primary osteoarthritis, left knee: Secondary | ICD-10-CM | POA: Diagnosis not present

## 2016-11-01 DIAGNOSIS — M1711 Unilateral primary osteoarthritis, right knee: Secondary | ICD-10-CM | POA: Diagnosis not present

## 2016-11-01 DIAGNOSIS — M25561 Pain in right knee: Secondary | ICD-10-CM | POA: Diagnosis not present

## 2016-11-02 DIAGNOSIS — F0390 Unspecified dementia without behavioral disturbance: Secondary | ICD-10-CM | POA: Diagnosis not present

## 2016-11-02 DIAGNOSIS — I129 Hypertensive chronic kidney disease with stage 1 through stage 4 chronic kidney disease, or unspecified chronic kidney disease: Secondary | ICD-10-CM | POA: Diagnosis not present

## 2016-11-02 DIAGNOSIS — I872 Venous insufficiency (chronic) (peripheral): Secondary | ICD-10-CM | POA: Diagnosis not present

## 2016-11-02 DIAGNOSIS — N183 Chronic kidney disease, stage 3 (moderate): Secondary | ICD-10-CM | POA: Diagnosis not present

## 2016-11-02 DIAGNOSIS — I2510 Atherosclerotic heart disease of native coronary artery without angina pectoris: Secondary | ICD-10-CM | POA: Diagnosis not present

## 2016-11-02 DIAGNOSIS — E1122 Type 2 diabetes mellitus with diabetic chronic kidney disease: Secondary | ICD-10-CM | POA: Diagnosis not present

## 2016-11-06 ENCOUNTER — Ambulatory Visit: Payer: Medicare Other | Admitting: Podiatry

## 2016-11-06 ENCOUNTER — Ambulatory Visit
Admission: RE | Admit: 2016-11-06 | Discharge: 2016-11-06 | Disposition: A | Discharge status: Home / Self Care | Payer: Medicare Other | Source: Ambulatory Visit | Attending: Physician Assistant | Admitting: Physician Assistant

## 2016-11-06 DIAGNOSIS — S065XAA Traumatic subdural hemorrhage with loss of consciousness status unknown, initial encounter: Secondary | ICD-10-CM

## 2016-11-06 DIAGNOSIS — S065X9A Traumatic subdural hemorrhage with loss of consciousness of unspecified duration, initial encounter: Secondary | ICD-10-CM

## 2016-11-07 DIAGNOSIS — R2689 Other abnormalities of gait and mobility: Secondary | ICD-10-CM | POA: Diagnosis not present

## 2016-11-07 DIAGNOSIS — Z8679 Personal history of other diseases of the circulatory system: Secondary | ICD-10-CM | POA: Diagnosis not present

## 2016-11-07 DIAGNOSIS — I4891 Unspecified atrial fibrillation: Secondary | ICD-10-CM | POA: Diagnosis not present

## 2016-11-07 DIAGNOSIS — R1310 Dysphagia, unspecified: Secondary | ICD-10-CM | POA: Diagnosis not present

## 2016-11-07 DIAGNOSIS — Z8701 Personal history of pneumonia (recurrent): Secondary | ICD-10-CM | POA: Diagnosis not present

## 2016-11-07 DIAGNOSIS — F0390 Unspecified dementia without behavioral disturbance: Secondary | ICD-10-CM | POA: Diagnosis not present

## 2016-11-07 DIAGNOSIS — R634 Abnormal weight loss: Secondary | ICD-10-CM | POA: Diagnosis not present

## 2016-11-07 DIAGNOSIS — I2510 Atherosclerotic heart disease of native coronary artery without angina pectoris: Secondary | ICD-10-CM | POA: Diagnosis not present

## 2016-11-07 DIAGNOSIS — R609 Edema, unspecified: Secondary | ICD-10-CM | POA: Diagnosis not present

## 2016-11-07 DIAGNOSIS — N183 Chronic kidney disease, stage 3 (moderate): Secondary | ICD-10-CM | POA: Diagnosis not present

## 2016-11-07 DIAGNOSIS — E785 Hyperlipidemia, unspecified: Secondary | ICD-10-CM | POA: Diagnosis not present

## 2016-11-07 DIAGNOSIS — N400 Enlarged prostate without lower urinary tract symptoms: Secondary | ICD-10-CM | POA: Diagnosis not present

## 2016-11-07 DIAGNOSIS — E1122 Type 2 diabetes mellitus with diabetic chronic kidney disease: Secondary | ICD-10-CM | POA: Diagnosis not present

## 2016-11-07 DIAGNOSIS — I872 Venous insufficiency (chronic) (peripheral): Secondary | ICD-10-CM | POA: Diagnosis not present

## 2016-11-07 DIAGNOSIS — K4090 Unilateral inguinal hernia, without obstruction or gangrene, not specified as recurrent: Secondary | ICD-10-CM | POA: Diagnosis not present

## 2016-11-07 DIAGNOSIS — I129 Hypertensive chronic kidney disease with stage 1 through stage 4 chronic kidney disease, or unspecified chronic kidney disease: Secondary | ICD-10-CM | POA: Diagnosis not present

## 2016-11-07 DIAGNOSIS — R05 Cough: Secondary | ICD-10-CM | POA: Diagnosis not present

## 2016-11-08 DIAGNOSIS — I2510 Atherosclerotic heart disease of native coronary artery without angina pectoris: Secondary | ICD-10-CM | POA: Diagnosis not present

## 2016-11-08 DIAGNOSIS — G8929 Other chronic pain: Secondary | ICD-10-CM | POA: Diagnosis not present

## 2016-11-08 DIAGNOSIS — E1122 Type 2 diabetes mellitus with diabetic chronic kidney disease: Secondary | ICD-10-CM | POA: Diagnosis not present

## 2016-11-08 DIAGNOSIS — F0390 Unspecified dementia without behavioral disturbance: Secondary | ICD-10-CM | POA: Diagnosis not present

## 2016-11-08 DIAGNOSIS — I129 Hypertensive chronic kidney disease with stage 1 through stage 4 chronic kidney disease, or unspecified chronic kidney disease: Secondary | ICD-10-CM | POA: Diagnosis not present

## 2016-11-08 DIAGNOSIS — N183 Chronic kidney disease, stage 3 (moderate): Secondary | ICD-10-CM | POA: Diagnosis not present

## 2016-11-08 DIAGNOSIS — I872 Venous insufficiency (chronic) (peripheral): Secondary | ICD-10-CM | POA: Diagnosis not present

## 2016-11-08 DIAGNOSIS — M25562 Pain in left knee: Secondary | ICD-10-CM | POA: Diagnosis not present

## 2016-11-08 DIAGNOSIS — M1711 Unilateral primary osteoarthritis, right knee: Secondary | ICD-10-CM | POA: Diagnosis not present

## 2016-11-08 DIAGNOSIS — M1712 Unilateral primary osteoarthritis, left knee: Secondary | ICD-10-CM | POA: Diagnosis not present

## 2016-11-14 DIAGNOSIS — M25561 Pain in right knee: Secondary | ICD-10-CM | POA: Diagnosis not present

## 2016-11-14 DIAGNOSIS — G308 Other Alzheimer's disease: Secondary | ICD-10-CM | POA: Diagnosis not present

## 2016-11-14 DIAGNOSIS — G8929 Other chronic pain: Secondary | ICD-10-CM | POA: Diagnosis not present

## 2016-11-14 DIAGNOSIS — I2510 Atherosclerotic heart disease of native coronary artery without angina pectoris: Secondary | ICD-10-CM | POA: Diagnosis not present

## 2016-11-14 DIAGNOSIS — E1129 Type 2 diabetes mellitus with other diabetic kidney complication: Secondary | ICD-10-CM | POA: Diagnosis not present

## 2016-11-14 DIAGNOSIS — I5022 Chronic systolic (congestive) heart failure: Secondary | ICD-10-CM | POA: Diagnosis not present

## 2016-11-17 DIAGNOSIS — R339 Retention of urine, unspecified: Secondary | ICD-10-CM | POA: Diagnosis not present

## 2016-11-17 DIAGNOSIS — R972 Elevated prostate specific antigen [PSA]: Secondary | ICD-10-CM | POA: Diagnosis not present

## 2016-11-17 DIAGNOSIS — C61 Malignant neoplasm of prostate: Secondary | ICD-10-CM | POA: Diagnosis not present

## 2016-11-17 DIAGNOSIS — N183 Chronic kidney disease, stage 3 (moderate): Secondary | ICD-10-CM | POA: Diagnosis not present

## 2016-11-17 DIAGNOSIS — R310 Gross hematuria: Secondary | ICD-10-CM | POA: Diagnosis not present

## 2016-11-17 DIAGNOSIS — N281 Cyst of kidney, acquired: Secondary | ICD-10-CM | POA: Diagnosis not present

## 2016-11-17 LAB — ECHOCARDIOGRAM COMPLETE
HEIGHTINCHES: 62 in
Weight: 2304 oz

## 2016-11-22 DIAGNOSIS — S065X9A Traumatic subdural hemorrhage with loss of consciousness of unspecified duration, initial encounter: Secondary | ICD-10-CM | POA: Diagnosis not present

## 2016-11-22 DIAGNOSIS — Z6827 Body mass index (BMI) 27.0-27.9, adult: Secondary | ICD-10-CM | POA: Diagnosis not present

## 2016-12-04 DIAGNOSIS — E1359 Other specified diabetes mellitus with other circulatory complications: Secondary | ICD-10-CM | POA: Diagnosis not present

## 2016-12-04 DIAGNOSIS — B351 Tinea unguium: Secondary | ICD-10-CM | POA: Diagnosis not present

## 2016-12-04 DIAGNOSIS — R609 Edema, unspecified: Secondary | ICD-10-CM | POA: Diagnosis not present

## 2016-12-04 DIAGNOSIS — R262 Difficulty in walking, not elsewhere classified: Secondary | ICD-10-CM | POA: Diagnosis not present

## 2016-12-04 DIAGNOSIS — L84 Corns and callosities: Secondary | ICD-10-CM | POA: Diagnosis not present

## 2016-12-09 DIAGNOSIS — Z23 Encounter for immunization: Secondary | ICD-10-CM | POA: Diagnosis not present

## 2016-12-21 DIAGNOSIS — Z23 Encounter for immunization: Secondary | ICD-10-CM | POA: Diagnosis not present

## 2016-12-27 DIAGNOSIS — E119 Type 2 diabetes mellitus without complications: Secondary | ICD-10-CM | POA: Diagnosis not present

## 2016-12-29 DIAGNOSIS — R3915 Urgency of urination: Secondary | ICD-10-CM | POA: Diagnosis not present

## 2016-12-29 DIAGNOSIS — R350 Frequency of micturition: Secondary | ICD-10-CM | POA: Diagnosis not present

## 2017-01-09 DIAGNOSIS — Z8701 Personal history of pneumonia (recurrent): Secondary | ICD-10-CM | POA: Diagnosis not present

## 2017-01-09 DIAGNOSIS — E119 Type 2 diabetes mellitus without complications: Secondary | ICD-10-CM | POA: Diagnosis not present

## 2017-01-09 DIAGNOSIS — R2689 Other abnormalities of gait and mobility: Secondary | ICD-10-CM | POA: Diagnosis not present

## 2017-01-09 DIAGNOSIS — R609 Edema, unspecified: Secondary | ICD-10-CM | POA: Diagnosis not present

## 2017-01-09 DIAGNOSIS — G8929 Other chronic pain: Secondary | ICD-10-CM | POA: Diagnosis not present

## 2017-01-09 DIAGNOSIS — G308 Other Alzheimer's disease: Secondary | ICD-10-CM | POA: Diagnosis not present

## 2017-01-09 DIAGNOSIS — I10 Essential (primary) hypertension: Secondary | ICD-10-CM | POA: Diagnosis not present

## 2017-01-09 DIAGNOSIS — K4090 Unilateral inguinal hernia, without obstruction or gangrene, not specified as recurrent: Secondary | ICD-10-CM | POA: Diagnosis not present

## 2017-01-16 DIAGNOSIS — Z8701 Personal history of pneumonia (recurrent): Secondary | ICD-10-CM | POA: Diagnosis not present

## 2017-01-16 DIAGNOSIS — M25562 Pain in left knee: Secondary | ICD-10-CM | POA: Diagnosis not present

## 2017-01-16 DIAGNOSIS — I10 Essential (primary) hypertension: Secondary | ICD-10-CM | POA: Diagnosis not present

## 2017-01-16 DIAGNOSIS — G308 Other Alzheimer's disease: Secondary | ICD-10-CM | POA: Diagnosis not present

## 2017-01-16 DIAGNOSIS — R2689 Other abnormalities of gait and mobility: Secondary | ICD-10-CM | POA: Diagnosis not present

## 2017-01-16 DIAGNOSIS — R609 Edema, unspecified: Secondary | ICD-10-CM | POA: Diagnosis not present

## 2017-01-19 DIAGNOSIS — M25562 Pain in left knee: Secondary | ICD-10-CM | POA: Diagnosis not present

## 2017-01-19 DIAGNOSIS — F0280 Dementia in other diseases classified elsewhere without behavioral disturbance: Secondary | ICD-10-CM | POA: Diagnosis not present

## 2017-01-19 DIAGNOSIS — M25561 Pain in right knee: Secondary | ICD-10-CM | POA: Diagnosis not present

## 2017-01-19 DIAGNOSIS — Z9181 History of falling: Secondary | ICD-10-CM | POA: Diagnosis not present

## 2017-01-19 DIAGNOSIS — E119 Type 2 diabetes mellitus without complications: Secondary | ICD-10-CM | POA: Diagnosis not present

## 2017-01-19 DIAGNOSIS — R2689 Other abnormalities of gait and mobility: Secondary | ICD-10-CM | POA: Diagnosis not present

## 2017-01-19 DIAGNOSIS — R339 Retention of urine, unspecified: Secondary | ICD-10-CM | POA: Diagnosis not present

## 2017-01-19 DIAGNOSIS — L89151 Pressure ulcer of sacral region, stage 1: Secondary | ICD-10-CM | POA: Diagnosis not present

## 2017-01-19 DIAGNOSIS — E1122 Type 2 diabetes mellitus with diabetic chronic kidney disease: Secondary | ICD-10-CM | POA: Diagnosis not present

## 2017-01-19 DIAGNOSIS — C61 Malignant neoplasm of prostate: Secondary | ICD-10-CM | POA: Diagnosis not present

## 2017-01-19 DIAGNOSIS — Z7984 Long term (current) use of oral hypoglycemic drugs: Secondary | ICD-10-CM | POA: Diagnosis not present

## 2017-01-19 DIAGNOSIS — N183 Chronic kidney disease, stage 3 (moderate): Secondary | ICD-10-CM | POA: Diagnosis not present

## 2017-01-19 DIAGNOSIS — Z79899 Other long term (current) drug therapy: Secondary | ICD-10-CM | POA: Diagnosis not present

## 2017-01-19 DIAGNOSIS — G309 Alzheimer's disease, unspecified: Secondary | ICD-10-CM | POA: Diagnosis not present

## 2017-01-23 DIAGNOSIS — F0280 Dementia in other diseases classified elsewhere without behavioral disturbance: Secondary | ICD-10-CM | POA: Diagnosis not present

## 2017-01-23 DIAGNOSIS — M25561 Pain in right knee: Secondary | ICD-10-CM | POA: Diagnosis not present

## 2017-01-23 DIAGNOSIS — I10 Essential (primary) hypertension: Secondary | ICD-10-CM | POA: Diagnosis not present

## 2017-01-23 DIAGNOSIS — R609 Edema, unspecified: Secondary | ICD-10-CM | POA: Diagnosis not present

## 2017-01-23 DIAGNOSIS — M25562 Pain in left knee: Secondary | ICD-10-CM | POA: Diagnosis not present

## 2017-01-23 DIAGNOSIS — L988 Other specified disorders of the skin and subcutaneous tissue: Secondary | ICD-10-CM | POA: Diagnosis not present

## 2017-01-23 DIAGNOSIS — E1122 Type 2 diabetes mellitus with diabetic chronic kidney disease: Secondary | ICD-10-CM | POA: Diagnosis not present

## 2017-01-23 DIAGNOSIS — Z8701 Personal history of pneumonia (recurrent): Secondary | ICD-10-CM | POA: Diagnosis not present

## 2017-01-23 DIAGNOSIS — G309 Alzheimer's disease, unspecified: Secondary | ICD-10-CM | POA: Diagnosis not present

## 2017-01-23 DIAGNOSIS — R2689 Other abnormalities of gait and mobility: Secondary | ICD-10-CM | POA: Diagnosis not present

## 2017-01-23 DIAGNOSIS — G308 Other Alzheimer's disease: Secondary | ICD-10-CM | POA: Diagnosis not present

## 2017-01-24 DIAGNOSIS — R350 Frequency of micturition: Secondary | ICD-10-CM | POA: Diagnosis not present

## 2017-01-24 DIAGNOSIS — M25561 Pain in right knee: Secondary | ICD-10-CM | POA: Diagnosis not present

## 2017-01-24 DIAGNOSIS — G309 Alzheimer's disease, unspecified: Secondary | ICD-10-CM | POA: Diagnosis not present

## 2017-01-24 DIAGNOSIS — R2689 Other abnormalities of gait and mobility: Secondary | ICD-10-CM | POA: Diagnosis not present

## 2017-01-24 DIAGNOSIS — M25562 Pain in left knee: Secondary | ICD-10-CM | POA: Diagnosis not present

## 2017-01-24 DIAGNOSIS — E1122 Type 2 diabetes mellitus with diabetic chronic kidney disease: Secondary | ICD-10-CM | POA: Diagnosis not present

## 2017-01-24 DIAGNOSIS — F0280 Dementia in other diseases classified elsewhere without behavioral disturbance: Secondary | ICD-10-CM | POA: Diagnosis not present

## 2017-01-25 DIAGNOSIS — M25562 Pain in left knee: Secondary | ICD-10-CM | POA: Diagnosis not present

## 2017-01-25 DIAGNOSIS — R2689 Other abnormalities of gait and mobility: Secondary | ICD-10-CM | POA: Diagnosis not present

## 2017-01-25 DIAGNOSIS — G309 Alzheimer's disease, unspecified: Secondary | ICD-10-CM | POA: Diagnosis not present

## 2017-01-25 DIAGNOSIS — F0280 Dementia in other diseases classified elsewhere without behavioral disturbance: Secondary | ICD-10-CM | POA: Diagnosis not present

## 2017-01-25 DIAGNOSIS — E1122 Type 2 diabetes mellitus with diabetic chronic kidney disease: Secondary | ICD-10-CM | POA: Diagnosis not present

## 2017-01-25 DIAGNOSIS — M25561 Pain in right knee: Secondary | ICD-10-CM | POA: Diagnosis not present

## 2017-01-29 DIAGNOSIS — M25562 Pain in left knee: Secondary | ICD-10-CM | POA: Diagnosis not present

## 2017-01-29 DIAGNOSIS — E1122 Type 2 diabetes mellitus with diabetic chronic kidney disease: Secondary | ICD-10-CM | POA: Diagnosis not present

## 2017-01-29 DIAGNOSIS — R2689 Other abnormalities of gait and mobility: Secondary | ICD-10-CM | POA: Diagnosis not present

## 2017-01-29 DIAGNOSIS — M25561 Pain in right knee: Secondary | ICD-10-CM | POA: Diagnosis not present

## 2017-01-29 DIAGNOSIS — G309 Alzheimer's disease, unspecified: Secondary | ICD-10-CM | POA: Diagnosis not present

## 2017-01-29 DIAGNOSIS — F0280 Dementia in other diseases classified elsewhere without behavioral disturbance: Secondary | ICD-10-CM | POA: Diagnosis not present

## 2017-01-30 DIAGNOSIS — M25561 Pain in right knee: Secondary | ICD-10-CM | POA: Diagnosis not present

## 2017-01-30 DIAGNOSIS — R2689 Other abnormalities of gait and mobility: Secondary | ICD-10-CM | POA: Diagnosis not present

## 2017-01-30 DIAGNOSIS — M25562 Pain in left knee: Secondary | ICD-10-CM | POA: Diagnosis not present

## 2017-01-30 DIAGNOSIS — L988 Other specified disorders of the skin and subcutaneous tissue: Secondary | ICD-10-CM | POA: Diagnosis not present

## 2017-01-30 DIAGNOSIS — G308 Other Alzheimer's disease: Secondary | ICD-10-CM | POA: Diagnosis not present

## 2017-01-30 DIAGNOSIS — K625 Hemorrhage of anus and rectum: Secondary | ICD-10-CM | POA: Diagnosis not present

## 2017-01-30 DIAGNOSIS — I10 Essential (primary) hypertension: Secondary | ICD-10-CM | POA: Diagnosis not present

## 2017-01-30 DIAGNOSIS — R609 Edema, unspecified: Secondary | ICD-10-CM | POA: Diagnosis not present

## 2017-01-31 DIAGNOSIS — M25561 Pain in right knee: Secondary | ICD-10-CM | POA: Diagnosis not present

## 2017-01-31 DIAGNOSIS — M25562 Pain in left knee: Secondary | ICD-10-CM | POA: Diagnosis not present

## 2017-01-31 DIAGNOSIS — E1122 Type 2 diabetes mellitus with diabetic chronic kidney disease: Secondary | ICD-10-CM | POA: Diagnosis not present

## 2017-01-31 DIAGNOSIS — R2689 Other abnormalities of gait and mobility: Secondary | ICD-10-CM | POA: Diagnosis not present

## 2017-01-31 DIAGNOSIS — F0280 Dementia in other diseases classified elsewhere without behavioral disturbance: Secondary | ICD-10-CM | POA: Diagnosis not present

## 2017-01-31 DIAGNOSIS — G309 Alzheimer's disease, unspecified: Secondary | ICD-10-CM | POA: Diagnosis not present

## 2017-02-01 DIAGNOSIS — E1122 Type 2 diabetes mellitus with diabetic chronic kidney disease: Secondary | ICD-10-CM | POA: Diagnosis not present

## 2017-02-01 DIAGNOSIS — G309 Alzheimer's disease, unspecified: Secondary | ICD-10-CM | POA: Diagnosis not present

## 2017-02-01 DIAGNOSIS — F0280 Dementia in other diseases classified elsewhere without behavioral disturbance: Secondary | ICD-10-CM | POA: Diagnosis not present

## 2017-02-01 DIAGNOSIS — M25562 Pain in left knee: Secondary | ICD-10-CM | POA: Diagnosis not present

## 2017-02-01 DIAGNOSIS — M25561 Pain in right knee: Secondary | ICD-10-CM | POA: Diagnosis not present

## 2017-02-01 DIAGNOSIS — R2689 Other abnormalities of gait and mobility: Secondary | ICD-10-CM | POA: Diagnosis not present

## 2017-02-02 DIAGNOSIS — G309 Alzheimer's disease, unspecified: Secondary | ICD-10-CM | POA: Diagnosis not present

## 2017-02-02 DIAGNOSIS — E1122 Type 2 diabetes mellitus with diabetic chronic kidney disease: Secondary | ICD-10-CM | POA: Diagnosis not present

## 2017-02-02 DIAGNOSIS — F0280 Dementia in other diseases classified elsewhere without behavioral disturbance: Secondary | ICD-10-CM | POA: Diagnosis not present

## 2017-02-02 DIAGNOSIS — M25562 Pain in left knee: Secondary | ICD-10-CM | POA: Diagnosis not present

## 2017-02-02 DIAGNOSIS — M25561 Pain in right knee: Secondary | ICD-10-CM | POA: Diagnosis not present

## 2017-02-02 DIAGNOSIS — R2689 Other abnormalities of gait and mobility: Secondary | ICD-10-CM | POA: Diagnosis not present

## 2017-02-05 DIAGNOSIS — M25562 Pain in left knee: Secondary | ICD-10-CM | POA: Diagnosis not present

## 2017-02-05 DIAGNOSIS — M25561 Pain in right knee: Secondary | ICD-10-CM | POA: Diagnosis not present

## 2017-02-05 DIAGNOSIS — E1122 Type 2 diabetes mellitus with diabetic chronic kidney disease: Secondary | ICD-10-CM | POA: Diagnosis not present

## 2017-02-05 DIAGNOSIS — R2689 Other abnormalities of gait and mobility: Secondary | ICD-10-CM | POA: Diagnosis not present

## 2017-02-05 DIAGNOSIS — G309 Alzheimer's disease, unspecified: Secondary | ICD-10-CM | POA: Diagnosis not present

## 2017-02-05 DIAGNOSIS — F0280 Dementia in other diseases classified elsewhere without behavioral disturbance: Secondary | ICD-10-CM | POA: Diagnosis not present

## 2017-02-07 DIAGNOSIS — M25561 Pain in right knee: Secondary | ICD-10-CM | POA: Diagnosis not present

## 2017-02-07 DIAGNOSIS — E1122 Type 2 diabetes mellitus with diabetic chronic kidney disease: Secondary | ICD-10-CM | POA: Diagnosis not present

## 2017-02-07 DIAGNOSIS — R2689 Other abnormalities of gait and mobility: Secondary | ICD-10-CM | POA: Diagnosis not present

## 2017-02-07 DIAGNOSIS — M25562 Pain in left knee: Secondary | ICD-10-CM | POA: Diagnosis not present

## 2017-02-07 DIAGNOSIS — G309 Alzheimer's disease, unspecified: Secondary | ICD-10-CM | POA: Diagnosis not present

## 2017-02-07 DIAGNOSIS — F0280 Dementia in other diseases classified elsewhere without behavioral disturbance: Secondary | ICD-10-CM | POA: Diagnosis not present

## 2017-02-08 DIAGNOSIS — R2689 Other abnormalities of gait and mobility: Secondary | ICD-10-CM | POA: Diagnosis not present

## 2017-02-08 DIAGNOSIS — G309 Alzheimer's disease, unspecified: Secondary | ICD-10-CM | POA: Diagnosis not present

## 2017-02-08 DIAGNOSIS — F0280 Dementia in other diseases classified elsewhere without behavioral disturbance: Secondary | ICD-10-CM | POA: Diagnosis not present

## 2017-02-08 DIAGNOSIS — M25562 Pain in left knee: Secondary | ICD-10-CM | POA: Diagnosis not present

## 2017-02-08 DIAGNOSIS — E1122 Type 2 diabetes mellitus with diabetic chronic kidney disease: Secondary | ICD-10-CM | POA: Diagnosis not present

## 2017-02-08 DIAGNOSIS — M25561 Pain in right knee: Secondary | ICD-10-CM | POA: Diagnosis not present

## 2017-02-09 DIAGNOSIS — F0280 Dementia in other diseases classified elsewhere without behavioral disturbance: Secondary | ICD-10-CM | POA: Diagnosis not present

## 2017-02-09 DIAGNOSIS — M1711 Unilateral primary osteoarthritis, right knee: Secondary | ICD-10-CM | POA: Diagnosis not present

## 2017-02-09 DIAGNOSIS — I959 Hypotension, unspecified: Secondary | ICD-10-CM | POA: Diagnosis not present

## 2017-02-09 DIAGNOSIS — M25561 Pain in right knee: Secondary | ICD-10-CM | POA: Diagnosis not present

## 2017-02-09 DIAGNOSIS — M1712 Unilateral primary osteoarthritis, left knee: Secondary | ICD-10-CM | POA: Diagnosis not present

## 2017-02-09 DIAGNOSIS — E1122 Type 2 diabetes mellitus with diabetic chronic kidney disease: Secondary | ICD-10-CM | POA: Diagnosis not present

## 2017-02-09 DIAGNOSIS — G309 Alzheimer's disease, unspecified: Secondary | ICD-10-CM | POA: Diagnosis not present

## 2017-02-09 DIAGNOSIS — E785 Hyperlipidemia, unspecified: Secondary | ICD-10-CM | POA: Diagnosis not present

## 2017-02-09 DIAGNOSIS — M25562 Pain in left knee: Secondary | ICD-10-CM | POA: Diagnosis not present

## 2017-02-09 DIAGNOSIS — R2689 Other abnormalities of gait and mobility: Secondary | ICD-10-CM | POA: Diagnosis not present

## 2017-02-09 DIAGNOSIS — N183 Chronic kidney disease, stage 3 (moderate): Secondary | ICD-10-CM | POA: Diagnosis not present

## 2017-02-09 DIAGNOSIS — I10 Essential (primary) hypertension: Secondary | ICD-10-CM | POA: Diagnosis not present

## 2017-02-12 DIAGNOSIS — F0280 Dementia in other diseases classified elsewhere without behavioral disturbance: Secondary | ICD-10-CM | POA: Diagnosis not present

## 2017-02-12 DIAGNOSIS — M25562 Pain in left knee: Secondary | ICD-10-CM | POA: Diagnosis not present

## 2017-02-12 DIAGNOSIS — R2689 Other abnormalities of gait and mobility: Secondary | ICD-10-CM | POA: Diagnosis not present

## 2017-02-12 DIAGNOSIS — G309 Alzheimer's disease, unspecified: Secondary | ICD-10-CM | POA: Diagnosis not present

## 2017-02-12 DIAGNOSIS — M25561 Pain in right knee: Secondary | ICD-10-CM | POA: Diagnosis not present

## 2017-02-12 DIAGNOSIS — E1122 Type 2 diabetes mellitus with diabetic chronic kidney disease: Secondary | ICD-10-CM | POA: Diagnosis not present

## 2017-02-14 DIAGNOSIS — E1122 Type 2 diabetes mellitus with diabetic chronic kidney disease: Secondary | ICD-10-CM | POA: Diagnosis not present

## 2017-02-14 DIAGNOSIS — M25562 Pain in left knee: Secondary | ICD-10-CM | POA: Diagnosis not present

## 2017-02-14 DIAGNOSIS — F0280 Dementia in other diseases classified elsewhere without behavioral disturbance: Secondary | ICD-10-CM | POA: Diagnosis not present

## 2017-02-14 DIAGNOSIS — G309 Alzheimer's disease, unspecified: Secondary | ICD-10-CM | POA: Diagnosis not present

## 2017-02-14 DIAGNOSIS — R2689 Other abnormalities of gait and mobility: Secondary | ICD-10-CM | POA: Diagnosis not present

## 2017-02-14 DIAGNOSIS — M25561 Pain in right knee: Secondary | ICD-10-CM | POA: Diagnosis not present

## 2017-02-15 DIAGNOSIS — E1122 Type 2 diabetes mellitus with diabetic chronic kidney disease: Secondary | ICD-10-CM | POA: Diagnosis not present

## 2017-02-15 DIAGNOSIS — F0280 Dementia in other diseases classified elsewhere without behavioral disturbance: Secondary | ICD-10-CM | POA: Diagnosis not present

## 2017-02-15 DIAGNOSIS — R2689 Other abnormalities of gait and mobility: Secondary | ICD-10-CM | POA: Diagnosis not present

## 2017-02-15 DIAGNOSIS — M25561 Pain in right knee: Secondary | ICD-10-CM | POA: Diagnosis not present

## 2017-02-15 DIAGNOSIS — G309 Alzheimer's disease, unspecified: Secondary | ICD-10-CM | POA: Diagnosis not present

## 2017-02-15 DIAGNOSIS — M25562 Pain in left knee: Secondary | ICD-10-CM | POA: Diagnosis not present

## 2017-02-16 DIAGNOSIS — M25561 Pain in right knee: Secondary | ICD-10-CM | POA: Diagnosis not present

## 2017-02-16 DIAGNOSIS — M25562 Pain in left knee: Secondary | ICD-10-CM | POA: Diagnosis not present

## 2017-02-16 DIAGNOSIS — G309 Alzheimer's disease, unspecified: Secondary | ICD-10-CM | POA: Diagnosis not present

## 2017-02-16 DIAGNOSIS — M170 Bilateral primary osteoarthritis of knee: Secondary | ICD-10-CM | POA: Diagnosis not present

## 2017-02-16 DIAGNOSIS — R2689 Other abnormalities of gait and mobility: Secondary | ICD-10-CM | POA: Diagnosis not present

## 2017-02-16 DIAGNOSIS — E1122 Type 2 diabetes mellitus with diabetic chronic kidney disease: Secondary | ICD-10-CM | POA: Diagnosis not present

## 2017-02-16 DIAGNOSIS — F0280 Dementia in other diseases classified elsewhere without behavioral disturbance: Secondary | ICD-10-CM | POA: Diagnosis not present

## 2017-02-19 DIAGNOSIS — R262 Difficulty in walking, not elsewhere classified: Secondary | ICD-10-CM | POA: Diagnosis not present

## 2017-02-19 DIAGNOSIS — L84 Corns and callosities: Secondary | ICD-10-CM | POA: Diagnosis not present

## 2017-02-19 DIAGNOSIS — R609 Edema, unspecified: Secondary | ICD-10-CM | POA: Diagnosis not present

## 2017-02-19 DIAGNOSIS — B351 Tinea unguium: Secondary | ICD-10-CM | POA: Diagnosis not present

## 2017-02-19 DIAGNOSIS — E1359 Other specified diabetes mellitus with other circulatory complications: Secondary | ICD-10-CM | POA: Diagnosis not present

## 2017-02-20 DIAGNOSIS — E1122 Type 2 diabetes mellitus with diabetic chronic kidney disease: Secondary | ICD-10-CM | POA: Diagnosis not present

## 2017-02-20 DIAGNOSIS — M25562 Pain in left knee: Secondary | ICD-10-CM | POA: Diagnosis not present

## 2017-02-20 DIAGNOSIS — M25561 Pain in right knee: Secondary | ICD-10-CM | POA: Diagnosis not present

## 2017-02-20 DIAGNOSIS — G309 Alzheimer's disease, unspecified: Secondary | ICD-10-CM | POA: Diagnosis not present

## 2017-02-20 DIAGNOSIS — R2689 Other abnormalities of gait and mobility: Secondary | ICD-10-CM | POA: Diagnosis not present

## 2017-02-20 DIAGNOSIS — F0280 Dementia in other diseases classified elsewhere without behavioral disturbance: Secondary | ICD-10-CM | POA: Diagnosis not present

## 2017-02-21 DIAGNOSIS — Z79899 Other long term (current) drug therapy: Secondary | ICD-10-CM | POA: Diagnosis not present

## 2017-02-22 DIAGNOSIS — E1122 Type 2 diabetes mellitus with diabetic chronic kidney disease: Secondary | ICD-10-CM | POA: Diagnosis not present

## 2017-02-22 DIAGNOSIS — F0280 Dementia in other diseases classified elsewhere without behavioral disturbance: Secondary | ICD-10-CM | POA: Diagnosis not present

## 2017-02-22 DIAGNOSIS — G309 Alzheimer's disease, unspecified: Secondary | ICD-10-CM | POA: Diagnosis not present

## 2017-02-22 DIAGNOSIS — M25562 Pain in left knee: Secondary | ICD-10-CM | POA: Diagnosis not present

## 2017-02-22 DIAGNOSIS — R2689 Other abnormalities of gait and mobility: Secondary | ICD-10-CM | POA: Diagnosis not present

## 2017-02-22 DIAGNOSIS — M25561 Pain in right knee: Secondary | ICD-10-CM | POA: Diagnosis not present

## 2017-02-26 DIAGNOSIS — R413 Other amnesia: Secondary | ICD-10-CM | POA: Diagnosis not present

## 2017-02-26 DIAGNOSIS — I672 Cerebral atherosclerosis: Secondary | ICD-10-CM | POA: Diagnosis not present

## 2017-02-27 DIAGNOSIS — M25561 Pain in right knee: Secondary | ICD-10-CM | POA: Diagnosis not present

## 2017-02-27 DIAGNOSIS — F0280 Dementia in other diseases classified elsewhere without behavioral disturbance: Secondary | ICD-10-CM | POA: Diagnosis not present

## 2017-02-27 DIAGNOSIS — R2689 Other abnormalities of gait and mobility: Secondary | ICD-10-CM | POA: Diagnosis not present

## 2017-02-27 DIAGNOSIS — G309 Alzheimer's disease, unspecified: Secondary | ICD-10-CM | POA: Diagnosis not present

## 2017-02-27 DIAGNOSIS — E1122 Type 2 diabetes mellitus with diabetic chronic kidney disease: Secondary | ICD-10-CM | POA: Diagnosis not present

## 2017-02-27 DIAGNOSIS — M25562 Pain in left knee: Secondary | ICD-10-CM | POA: Diagnosis not present

## 2017-03-02 DIAGNOSIS — M25562 Pain in left knee: Secondary | ICD-10-CM | POA: Diagnosis not present

## 2017-03-02 DIAGNOSIS — M25561 Pain in right knee: Secondary | ICD-10-CM | POA: Diagnosis not present

## 2017-03-02 DIAGNOSIS — R2689 Other abnormalities of gait and mobility: Secondary | ICD-10-CM | POA: Diagnosis not present

## 2017-03-02 DIAGNOSIS — E1122 Type 2 diabetes mellitus with diabetic chronic kidney disease: Secondary | ICD-10-CM | POA: Diagnosis not present

## 2017-03-02 DIAGNOSIS — G309 Alzheimer's disease, unspecified: Secondary | ICD-10-CM | POA: Diagnosis not present

## 2017-03-02 DIAGNOSIS — F0280 Dementia in other diseases classified elsewhere without behavioral disturbance: Secondary | ICD-10-CM | POA: Diagnosis not present

## 2017-03-05 DIAGNOSIS — F4320 Adjustment disorder, unspecified: Secondary | ICD-10-CM | POA: Diagnosis not present

## 2017-03-05 DIAGNOSIS — G308 Other Alzheimer's disease: Secondary | ICD-10-CM | POA: Diagnosis not present

## 2017-03-06 DIAGNOSIS — L988 Other specified disorders of the skin and subcutaneous tissue: Secondary | ICD-10-CM | POA: Diagnosis not present

## 2017-03-06 DIAGNOSIS — I10 Essential (primary) hypertension: Secondary | ICD-10-CM | POA: Diagnosis not present

## 2017-03-06 DIAGNOSIS — G308 Other Alzheimer's disease: Secondary | ICD-10-CM | POA: Diagnosis not present

## 2017-03-06 DIAGNOSIS — K625 Hemorrhage of anus and rectum: Secondary | ICD-10-CM | POA: Diagnosis not present

## 2017-03-06 DIAGNOSIS — M25561 Pain in right knee: Secondary | ICD-10-CM | POA: Diagnosis not present

## 2017-03-06 DIAGNOSIS — R609 Edema, unspecified: Secondary | ICD-10-CM | POA: Diagnosis not present

## 2017-03-06 DIAGNOSIS — R2689 Other abnormalities of gait and mobility: Secondary | ICD-10-CM | POA: Diagnosis not present

## 2017-03-11 ENCOUNTER — Emergency Department (HOSPITAL_COMMUNITY)
Admission: EM | Admit: 2017-03-11 | Discharge: 2017-03-11 | Disposition: A | Discharge status: Home / Self Care | Payer: Medicare Other | Attending: Emergency Medicine | Admitting: Emergency Medicine

## 2017-03-11 ENCOUNTER — Encounter (HOSPITAL_COMMUNITY): Payer: Self-pay | Admitting: Nurse Practitioner

## 2017-03-11 ENCOUNTER — Emergency Department (HOSPITAL_COMMUNITY): Payer: Medicare Other

## 2017-03-11 DIAGNOSIS — I252 Old myocardial infarction: Secondary | ICD-10-CM | POA: Diagnosis not present

## 2017-03-11 DIAGNOSIS — I129 Hypertensive chronic kidney disease with stage 1 through stage 4 chronic kidney disease, or unspecified chronic kidney disease: Secondary | ICD-10-CM | POA: Diagnosis not present

## 2017-03-11 DIAGNOSIS — Z79899 Other long term (current) drug therapy: Secondary | ICD-10-CM | POA: Insufficient documentation

## 2017-03-11 DIAGNOSIS — J189 Pneumonia, unspecified organism: Secondary | ICD-10-CM | POA: Diagnosis not present

## 2017-03-11 DIAGNOSIS — F0390 Unspecified dementia without behavioral disturbance: Secondary | ICD-10-CM | POA: Insufficient documentation

## 2017-03-11 DIAGNOSIS — R4182 Altered mental status, unspecified: Secondary | ICD-10-CM | POA: Diagnosis present

## 2017-03-11 DIAGNOSIS — R918 Other nonspecific abnormal finding of lung field: Secondary | ICD-10-CM | POA: Diagnosis not present

## 2017-03-11 DIAGNOSIS — N183 Chronic kidney disease, stage 3 (moderate): Secondary | ICD-10-CM | POA: Insufficient documentation

## 2017-03-11 DIAGNOSIS — N390 Urinary tract infection, site not specified: Secondary | ICD-10-CM | POA: Diagnosis not present

## 2017-03-11 DIAGNOSIS — R05 Cough: Secondary | ICD-10-CM | POA: Diagnosis not present

## 2017-03-11 DIAGNOSIS — E1122 Type 2 diabetes mellitus with diabetic chronic kidney disease: Secondary | ICD-10-CM | POA: Diagnosis not present

## 2017-03-11 DIAGNOSIS — R0902 Hypoxemia: Secondary | ICD-10-CM | POA: Diagnosis not present

## 2017-03-11 DIAGNOSIS — R300 Dysuria: Secondary | ICD-10-CM | POA: Diagnosis not present

## 2017-03-11 LAB — CBC WITH DIFFERENTIAL/PLATELET
Basophils Absolute: 0 10*3/uL (ref 0.0–0.1)
Basophils Relative: 0 %
Eosinophils Absolute: 0.1 10*3/uL (ref 0.0–0.7)
Eosinophils Relative: 1 %
HCT: 47.6 % (ref 39.0–52.0)
Hemoglobin: 16.3 g/dL (ref 13.0–17.0)
Lymphocytes Relative: 14 %
Lymphs Abs: 1 10*3/uL (ref 0.7–4.0)
MCH: 30.9 pg (ref 26.0–34.0)
MCHC: 34.2 g/dL (ref 30.0–36.0)
MCV: 90.2 fL (ref 78.0–100.0)
Monocytes Absolute: 0.9 10*3/uL (ref 0.1–1.0)
Monocytes Relative: 12 %
Neutro Abs: 5.4 10*3/uL (ref 1.7–7.7)
Neutrophils Relative %: 73 %
Platelets: 65 10*3/uL — ABNORMAL LOW (ref 150–400)
RBC: 5.28 MIL/uL (ref 4.22–5.81)
RDW: 13.6 % (ref 11.5–15.5)
WBC: 7.3 10*3/uL (ref 4.0–10.5)

## 2017-03-11 LAB — BASIC METABOLIC PANEL
Anion gap: 11 (ref 5–15)
BUN: 52 mg/dL — ABNORMAL HIGH (ref 6–20)
CO2: 27 mmol/L (ref 22–32)
Calcium: 9.3 mg/dL (ref 8.9–10.3)
Chloride: 100 mmol/L — ABNORMAL LOW (ref 101–111)
Creatinine, Ser: 1.78 mg/dL — ABNORMAL HIGH (ref 0.61–1.24)
GFR calc Af Amer: 37 mL/min — ABNORMAL LOW (ref >60)
GFR calc non Af Amer: 32 mL/min — ABNORMAL LOW (ref >60)
Glucose, Bld: 253 mg/dL — ABNORMAL HIGH (ref 65–99)
Potassium: 4 mmol/L (ref 3.5–5.1)
Sodium: 138 mmol/L (ref 135–145)

## 2017-03-11 LAB — URINALYSIS, ROUTINE W REFLEX MICROSCOPIC
Bilirubin Urine: NEGATIVE
Glucose, UA: NEGATIVE mg/dL
Hgb urine dipstick: NEGATIVE
Ketones, ur: NEGATIVE mg/dL
Leukocytes, UA: NEGATIVE
Nitrite: NEGATIVE
Protein, ur: NEGATIVE mg/dL
Specific Gravity, Urine: 1.011 (ref 1.005–1.030)
pH: 5 (ref 5.0–8.0)

## 2017-03-11 LAB — LACTIC ACID, PLASMA: Lactic Acid, Venous: 1.3 mmol/L (ref 0.5–1.9)

## 2017-03-11 LAB — INFLUENZA PANEL BY PCR (TYPE A & B)
Influenza A By PCR: NEGATIVE
Influenza B By PCR: NEGATIVE

## 2017-03-11 MED ORDER — DOXYCYCLINE HYCLATE 100 MG PO TABS
100.0000 mg | ORAL_TABLET | Freq: Once | ORAL | Status: AC
  Administered 2017-03-11: 100 mg via ORAL
  Filled 2017-03-11: qty 1

## 2017-03-11 MED ORDER — BENZONATATE 100 MG PO CAPS: 100.0000 mg | ORAL_CAPSULE | Freq: Three times a day (TID) | ORAL | 0 refills | Status: AC | PRN

## 2017-03-11 MED ORDER — DOXYCYCLINE HYCLATE 100 MG PO CAPS: 100.0000 mg | ORAL_CAPSULE | Freq: Two times a day (BID) | ORAL | 0 refills | Status: AC

## 2017-03-11 MED ORDER — SODIUM CHLORIDE 0.9 % IV SOLN
INTRAVENOUS | Status: DC
  Administered 2017-03-11: 100 mL/h via INTRAVENOUS

## 2017-03-11 NOTE — ED Notes (Signed)
Patient transported to X-ray 

## 2017-03-11 NOTE — ED Notes (Signed)
Bed: XN17 Expected date:  Expected time:  Means of arrival:  Comments: AMS

## 2017-03-11 NOTE — ED Notes (Signed)
ED Provider at bedside. 

## 2017-03-11 NOTE — ED Provider Notes (Signed)
[facility] DEPT Provider Note   CSN: [id number] Arrival date & time: 03/11/17  1557     History   Chief Complaint Chief Complaint  Patient presents with  . Altered Mental Status    HPI Aaron Stephenson is a 84 y.o. male.  HPI   84 year old male with change in his behavior.  He is in a memory unit because of dementia.  Last couple days he is had some aggressive behavior which is out of character for him.  He seemed a little bit off balance and has been incontinent twice which is also out of character.  He has reported that the facility told them that he has also been hyperglycemic with sugars in the 300s he had a fever of 101.  His MAR indicates that he had Tylenol shortly before he left the facility for the ER.  Patient himself is pleasantly confused.  He has no acute complaints.   His daughter is at bedside.  She reports over the last few days he has had "a raspy" voice and occasional cough.  She states that his confusion can wax and wanes but his current behavior is not significantly changed from his baseline.   Past Medical History:  Diagnosis Date  . BPH (benign prostatic hyperplasia)   . BPH (benign prostatic hypertrophy) 09/21/2014  . Cellulitis in diabetic foot ([place]) 08/11/2016  . Chronic venous insufficiency   . CKD (chronic kidney disease)   . Dementia   . DM (diabetes mellitus), type 2 ([place])   . Enterococcus UTI   . Esophagitis   . GERD (gastroesophageal reflux disease)   . Hiatal hernia   . HTN (hypertension)   . Hyperlipidemia   . Kidney disease, chronic, stage III (GFR 30-59 ml/min) (HCC) 09/21/2014  . Migraine   . Myeloproliferative disease ([place])   . NSTEMI (non-ST elevated myocardial infarction) ([place]) 09/19/2014  . OA (osteoarthritis)   . Physical deconditioning   . Polycythemia vera ([place]) 02/20/2011   JAK 2 mutation +   . Polycythemia vera(238.4) 02/20/2011  . Protein calorie malnutrition ([place])   . Sepsis ([place]) 08/11/2016  .  Spinal stenosis, lumbar   . Thrombocytopenia ([place])   . Toxic encephalopathy   . Type 2 diabetes mellitus with renal manifestations ([place]) 09/19/2014  . Vertigo     Patient Active Problem List   Diagnosis Date Noted  . Upper airway cough syndrome 10/27/2016  . Syncope 10/06/2016  . Cellulitis 09/04/2016  . New onset a-fib ([place]) 08/19/2016  . Hypertensive heart disease with congestive heart failure ([place]) 08/19/2016  . Chronic systolic CHF (congestive heart failure) ([place]) 08/19/2016  . Coronary artery disease 08/19/2016  . Pressure injury of skin 08/16/2016  . Salmonella bacteremia 08/16/2016  . Sepsis ([place]) 08/11/2016  . Cellulitis in diabetic foot ([place]) 08/11/2016  . HCAP (healthcare-associated pneumonia) 08/11/2016  . Bilateral knee effusions 05/01/2016  . Primary osteoarthritis of both knees 05/01/2016  . GERD (gastroesophageal reflux disease) 07/26/2015  . Toxic encephalopathy 07/21/2015  . Kidney disease, chronic, stage III (GFR 30-59 ml/min) (HCC) 09/21/2014  . Abnormal cardiovascular function study 09/21/2014  . Benign prostatic hyperplasia 09/21/2014  . Hyperlipidemia   . Chronic venous insufficiency   . NSTEMI (non-ST elevated myocardial infarction) ([place]) 09/19/2014  . Type 2 diabetes mellitus with renal manifestations ([place]) 09/19/2014  . Polycythemia vera ([place]) 02/20/2011    Past Surgical History:  Procedure Laterality Date  . LOWER LEG SOFT TISSUE TUMOR EXCISION    . TOTAL HIP ARTHROPLASTY  1993   right       Home Medications    Prior to Admission medications   Medication Sig Start Date End Date Taking? Authorizing Provider  acetaminophen (TYLENOL) 650 MG CR tablet Take 650 mg by mouth 3 (three) times daily.     [provider]  apixaban (ELIQUIS) 2.5 MG TABS tablet Take 2.5 mg by mouth 2 (two) times daily.    [provider]  atorvastatin (LIPITOR) 10 MG tablet Take 10 mg by mouth at bedtime.    [provider]  docusate sodium  (COLACE) 100 MG capsule Take 100 mg by mouth daily.    [provider]  finasteride (PROSCAR) 5 MG tablet Take 5 mg by mouth at bedtime.  06/16/14   [provider]  glipiZIDE (GLUCOTROL XL) 5 MG 24 hr tablet Take 5 mg by mouth daily.    [provider]  Guaifenesin 1200 MG TB12 Take 1 tablet by mouth 2 (two) times daily.    [provider]  levETIRAcetam (KEPPRA) 500 MG tablet Take 1 tablet (500 mg total) by mouth 2 (two) times daily. 10/09/16   Patrecia Pour, MD  losartan (COZAAR) 25 MG tablet Take 1 tablet (25 mg total) by mouth daily. 10/26/16   Tanda Rockers, MD  metoprolol tartrate (LOPRESSOR) 25 MG tablet Take 25 mg by mouth 2 (two) times daily.    [provider]  mirtazapine (REMERON) 15 MG tablet Take 15 mg by mouth at bedtime.    [provider]  nitroGLYCERIN (NITROSTAT) 0.4 MG SL tablet Place 1 tablet (0.4 mg total) under the tongue every 5 (five) minutes x 3 doses as needed for chest pain. 09/21/14   Jacolyn Reedy, MD  sulfamethoxazole-trimethoprim (BACTRIM DS,SEPTRA DS) 800-160 MG tablet Take 1 tablet by mouth 2 (two) times daily.    [provider]  tamsulosin (FLOMAX) 0.4 MG CAPS capsule Take 0.4 mg by mouth 2 (two) times daily.     [provider]  tolterodine (DETROL LA) 4 MG 24 hr capsule Take 4 mg by mouth at bedtime.    [provider]  torsemide (DEMADEX) 10 MG tablet Take 10 mg by mouth daily.    [provider]    Family History Family History  Problem Relation Age of Onset  . Diabetes Mother   . Hypertension Mother   . Cancer Father   . Diabetes Brother   . Kidney failure Brother     Social History Social History   Tobacco Use  . Smoking status: Never Smoker  . Smokeless tobacco: Never Used  Substance Use Topics  . Alcohol use: Yes    Comment: rarely  . Drug use: No     Allergies   Penicillins; Aspirin; Cardura [doxazosin]; Ciprofloxacin; Macrodantin  [nitrofurantoin macrocrystal]; Other; Oxycodone; Sulfamethoxazole; Tramadol; Unisom [doxylamine succinate (sleep)]; and Xanax [alprazolam]   Review of Systems Review of Systems  All systems reviewed and negative, other than as noted in HPI.  Physical Exam Updated Vital Signs BP (!) 143/88 (BP Location: Left Arm)   Pulse 73   Temp 99.1 F (37.3 C) (Oral)   Resp 18   SpO2 98%   Physical Exam  Constitutional: He appears well-developed and well-nourished. No distress.  HENT:  Head: Normocephalic and atraumatic.  Eyes: Conjunctivae are normal. Right eye exhibits no discharge. Left eye exhibits no discharge.  Neck: Neck supple.  Cardiovascular: Normal rate, regular rhythm and normal heart sounds. Exam reveals no gallop and no  friction rub.  No murmur heard. Pulmonary/Chest: Effort normal and breath sounds normal. No respiratory distress.  Abdominal: Soft. He exhibits no distension. There is no tenderness.  Musculoskeletal: He exhibits no edema or tenderness.  Neurological: He is alert.  Oriented to himself and place.  Speech is clear although there is thoughts are disorganized and confused.  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  He follows commands.  He has no focal motor deficits.  Skin: Skin is warm and dry.  Psychiatric: He has a normal mood and affect. His behavior is normal. Thought content normal.  Nursing note and vitals reviewed.    ED Treatments / Results  Labs (all labs ordered are listed, but only abnormal results are displayed) Labs Reviewed  CBC WITH DIFFERENTIAL/PLATELET - Abnormal; Notable for the following components:      Result Value   Platelets 65 (*)    All other components within normal limits  BASIC METABOLIC PANEL - Abnormal; Notable for the following components:   Chloride 100 (*)    Glucose, Bld 253 (*)    BUN 52 (*)    Creatinine, Ser 1.78 (*)    GFR calc non Af Amer 32 (*)    GFR calc Af Amer 37 (*)     All other components within normal limits  URINALYSIS, ROUTINE W REFLEX MICROSCOPIC  INFLUENZA PANEL BY PCR (TYPE A & B)  LACTIC ACID, PLASMA    EKG  EKG Interpretation  Date/Time:  Sunday March 11 2017 16:30:26 EST Ventricular Rate:  77 PR Interval:    QRS Duration: 113 QT Interval:  428 QTC Calculation: 485 R Axis:   -131 Text Interpretation:  Sinus rhythm Prolonged PR interval Incomplete right bundle branch block Low voltage with right axis deviation Probable anteroseptal infarct, old Confirmed by Redonna Wilbert ([id number]) on 03/11/2017 4:46:24 PM       Radiology Dg Chest 2 View  Result Date: 03/11/2017 CLINICAL DATA:  Patient with bilat outbursts. Grasses behavior. Incontinence. EXAM: CHEST  2 VIEW COMPARISON:  Chest radiograph 10/06/2016. FINDINGS: Monitoring leads overlie the patient. Stable cardiomegaly. Low lung volumes. Retrocardiac consolidative opacities. Right basilar atelectasis. Trace pleural fluid. No pneumothorax. Thoracic spine degenerative changes. IMPRESSION: Retrocardiac opacity may be secondary to infection or atelectasis. Electronically Signed   By: Drew  Davis M.D.   On: 03/11/2017 17:04    Procedures Procedures (including critical care time)  Medications Ordered in ED Medications  0.9 %  sodium chloride infusion (not administered)  doxycycline (VIBRA-TABS) tablet 100 mg (not administered)     Initial Impression / Assessment and Plan / ED Course  I have reviewed the triage vital signs and the nursing notes.  Pertinent labs & imaging results that were available during my care of the patient were reviewed by me and considered in my medical decision making (see chart for details).     84  year old male with change in his behavior.  Reportedly febrile although afebrile in the emergency room.  He did receive Tylenol prior to arrival though.  Chest x-ray with hazy infiltrate versus atelectasis.  With his reported fever, cough and confusion will treat for  possible pneumonia.  Allergies to multiple classes of antibiotics.  Will put him on doxycycline.  Final Clinical Impressions(s) / ED Diagnoses   Final diagnoses:  Community acquired pneumonia, unspecified laterality    ED Discharge Orders    None      Virgel Manifold, MD 03/11/17 2301

## 2017-03-11 NOTE — ED Notes (Signed)
Family at bedside. 

## 2017-03-11 NOTE — ED Triage Notes (Signed)
Transported by [employer] from [place] ([facility] reported to EMS that patient has presented with violent outbursts and aggressive behavior starting yesterday which is abnormal. Today staff reported that patient had difficulty with ambulation and 2 episodes of incontinence. EMS reports that patient is alert and oriented x 2 (person and place). CBG reading of 380 mg/dl, EMS administered approximately 400 cc of NS PTA.

## 2017-03-13 DIAGNOSIS — N390 Urinary tract infection, site not specified: Secondary | ICD-10-CM | POA: Diagnosis not present

## 2017-03-13 DIAGNOSIS — R5383 Other fatigue: Secondary | ICD-10-CM | POA: Diagnosis not present

## 2017-03-13 DIAGNOSIS — R600 Localized edema: Secondary | ICD-10-CM | POA: Diagnosis not present

## 2017-03-13 DIAGNOSIS — G308 Other Alzheimer's disease: Secondary | ICD-10-CM | POA: Diagnosis not present

## 2017-03-13 DIAGNOSIS — J188 Other pneumonia, unspecified organism: Secondary | ICD-10-CM | POA: Diagnosis not present

## 2017-03-13 DIAGNOSIS — I10 Essential (primary) hypertension: Secondary | ICD-10-CM | POA: Diagnosis not present

## 2017-03-20 DIAGNOSIS — M1990 Unspecified osteoarthritis, unspecified site: Secondary | ICD-10-CM | POA: Diagnosis not present

## 2017-03-20 DIAGNOSIS — R600 Localized edema: Secondary | ICD-10-CM | POA: Diagnosis not present

## 2017-03-20 DIAGNOSIS — Z79899 Other long term (current) drug therapy: Secondary | ICD-10-CM | POA: Diagnosis not present

## 2017-03-20 DIAGNOSIS — J188 Other pneumonia, unspecified organism: Secondary | ICD-10-CM | POA: Diagnosis not present

## 2017-03-20 DIAGNOSIS — G308 Other Alzheimer's disease: Secondary | ICD-10-CM | POA: Diagnosis not present

## 2017-03-20 DIAGNOSIS — I10 Essential (primary) hypertension: Secondary | ICD-10-CM | POA: Diagnosis not present

## 2017-03-23 DIAGNOSIS — Z79899 Other long term (current) drug therapy: Secondary | ICD-10-CM | POA: Diagnosis not present

## 2017-03-26 DIAGNOSIS — I480 Paroxysmal atrial fibrillation: Secondary | ICD-10-CM | POA: Diagnosis not present

## 2017-03-26 DIAGNOSIS — E1121 Type 2 diabetes mellitus with diabetic nephropathy: Secondary | ICD-10-CM | POA: Diagnosis not present

## 2017-03-26 DIAGNOSIS — I2510 Atherosclerotic heart disease of native coronary artery without angina pectoris: Secondary | ICD-10-CM | POA: Diagnosis not present

## 2017-03-26 DIAGNOSIS — I119 Hypertensive heart disease without heart failure: Secondary | ICD-10-CM | POA: Diagnosis not present

## 2017-04-02 DIAGNOSIS — F321 Major depressive disorder, single episode, moderate: Secondary | ICD-10-CM | POA: Diagnosis not present

## 2017-04-02 DIAGNOSIS — G308 Other Alzheimer's disease: Secondary | ICD-10-CM | POA: Diagnosis not present

## 2017-04-03 DIAGNOSIS — R05 Cough: Secondary | ICD-10-CM | POA: Diagnosis not present

## 2017-04-10 DIAGNOSIS — Z79899 Other long term (current) drug therapy: Secondary | ICD-10-CM | POA: Diagnosis not present

## 2017-04-10 DIAGNOSIS — I10 Essential (primary) hypertension: Secondary | ICD-10-CM | POA: Diagnosis not present

## 2017-04-10 DIAGNOSIS — G308 Other Alzheimer's disease: Secondary | ICD-10-CM | POA: Diagnosis not present

## 2017-04-10 DIAGNOSIS — F418 Other specified anxiety disorders: Secondary | ICD-10-CM | POA: Diagnosis not present

## 2017-04-10 DIAGNOSIS — M170 Bilateral primary osteoarthritis of knee: Secondary | ICD-10-CM | POA: Diagnosis not present

## 2017-04-27 DIAGNOSIS — E039 Hypothyroidism, unspecified: Secondary | ICD-10-CM | POA: Diagnosis not present

## 2017-04-27 DIAGNOSIS — E785 Hyperlipidemia, unspecified: Secondary | ICD-10-CM | POA: Diagnosis not present

## 2017-04-27 DIAGNOSIS — R531 Weakness: Secondary | ICD-10-CM | POA: Diagnosis not present

## 2017-04-30 DIAGNOSIS — I2510 Atherosclerotic heart disease of native coronary artery without angina pectoris: Secondary | ICD-10-CM | POA: Diagnosis not present

## 2017-04-30 DIAGNOSIS — F0390 Unspecified dementia without behavioral disturbance: Secondary | ICD-10-CM | POA: Diagnosis not present

## 2017-04-30 DIAGNOSIS — M170 Bilateral primary osteoarthritis of knee: Secondary | ICD-10-CM | POA: Diagnosis not present

## 2017-04-30 DIAGNOSIS — N183 Chronic kidney disease, stage 3 (moderate): Secondary | ICD-10-CM | POA: Diagnosis not present

## 2017-04-30 DIAGNOSIS — E118 Type 2 diabetes mellitus with unspecified complications: Secondary | ICD-10-CM | POA: Diagnosis not present

## 2017-05-01 DIAGNOSIS — M25562 Pain in left knee: Secondary | ICD-10-CM | POA: Diagnosis not present

## 2017-05-01 DIAGNOSIS — M6281 Muscle weakness (generalized): Secondary | ICD-10-CM | POA: Diagnosis not present

## 2017-05-01 DIAGNOSIS — R296 Repeated falls: Secondary | ICD-10-CM | POA: Diagnosis not present

## 2017-05-01 DIAGNOSIS — R2689 Other abnormalities of gait and mobility: Secondary | ICD-10-CM | POA: Diagnosis not present

## 2017-05-02 DIAGNOSIS — M25562 Pain in left knee: Secondary | ICD-10-CM | POA: Diagnosis not present

## 2017-05-02 DIAGNOSIS — M6281 Muscle weakness (generalized): Secondary | ICD-10-CM | POA: Diagnosis not present

## 2017-05-02 DIAGNOSIS — R296 Repeated falls: Secondary | ICD-10-CM | POA: Diagnosis not present

## 2017-05-02 DIAGNOSIS — R2689 Other abnormalities of gait and mobility: Secondary | ICD-10-CM | POA: Diagnosis not present

## 2017-05-04 DIAGNOSIS — R2689 Other abnormalities of gait and mobility: Secondary | ICD-10-CM | POA: Diagnosis not present

## 2017-05-04 DIAGNOSIS — M6281 Muscle weakness (generalized): Secondary | ICD-10-CM | POA: Diagnosis not present

## 2017-05-04 DIAGNOSIS — R296 Repeated falls: Secondary | ICD-10-CM | POA: Diagnosis not present

## 2017-05-04 DIAGNOSIS — M25562 Pain in left knee: Secondary | ICD-10-CM | POA: Diagnosis not present

## 2017-05-08 DIAGNOSIS — R2689 Other abnormalities of gait and mobility: Secondary | ICD-10-CM | POA: Diagnosis not present

## 2017-05-08 DIAGNOSIS — R296 Repeated falls: Secondary | ICD-10-CM | POA: Diagnosis not present

## 2017-05-08 DIAGNOSIS — M6281 Muscle weakness (generalized): Secondary | ICD-10-CM | POA: Diagnosis not present

## 2017-05-08 DIAGNOSIS — M25562 Pain in left knee: Secondary | ICD-10-CM | POA: Diagnosis not present

## 2017-05-25 DIAGNOSIS — M1712 Unilateral primary osteoarthritis, left knee: Secondary | ICD-10-CM | POA: Diagnosis not present

## 2017-05-25 DIAGNOSIS — M1711 Unilateral primary osteoarthritis, right knee: Secondary | ICD-10-CM | POA: Diagnosis not present

## 2017-06-05 DIAGNOSIS — H04123 Dry eye syndrome of bilateral lacrimal glands: Secondary | ICD-10-CM | POA: Diagnosis not present

## 2017-06-05 DIAGNOSIS — E119 Type 2 diabetes mellitus without complications: Secondary | ICD-10-CM | POA: Diagnosis not present

## 2017-06-05 DIAGNOSIS — H25813 Combined forms of age-related cataract, bilateral: Secondary | ICD-10-CM | POA: Diagnosis not present

## 2017-06-05 DIAGNOSIS — H43813 Vitreous degeneration, bilateral: Secondary | ICD-10-CM | POA: Diagnosis not present

## 2017-06-08 DIAGNOSIS — M1712 Unilateral primary osteoarthritis, left knee: Secondary | ICD-10-CM | POA: Diagnosis not present

## 2017-06-25 DIAGNOSIS — B351 Tinea unguium: Secondary | ICD-10-CM | POA: Diagnosis not present

## 2017-06-25 DIAGNOSIS — R600 Localized edema: Secondary | ICD-10-CM | POA: Diagnosis not present

## 2017-06-25 DIAGNOSIS — I739 Peripheral vascular disease, unspecified: Secondary | ICD-10-CM | POA: Diagnosis not present

## 2017-06-25 DIAGNOSIS — L84 Corns and callosities: Secondary | ICD-10-CM | POA: Diagnosis not present

## 2017-07-02 DIAGNOSIS — N281 Cyst of kidney, acquired: Secondary | ICD-10-CM | POA: Diagnosis not present

## 2017-07-02 DIAGNOSIS — C61 Malignant neoplasm of prostate: Secondary | ICD-10-CM | POA: Diagnosis not present

## 2017-07-02 DIAGNOSIS — N183 Chronic kidney disease, stage 3 (moderate): Secondary | ICD-10-CM | POA: Diagnosis not present

## 2017-07-02 DIAGNOSIS — R339 Retention of urine, unspecified: Secondary | ICD-10-CM | POA: Diagnosis not present

## 2017-07-02 DIAGNOSIS — R972 Elevated prostate specific antigen [PSA]: Secondary | ICD-10-CM | POA: Diagnosis not present

## 2017-07-15 DIAGNOSIS — Z0189 Encounter for other specified special examinations: Secondary | ICD-10-CM | POA: Diagnosis not present

## 2017-07-16 DIAGNOSIS — R531 Weakness: Secondary | ICD-10-CM | POA: Diagnosis not present

## 2017-07-24 DIAGNOSIS — F0390 Unspecified dementia without behavioral disturbance: Secondary | ICD-10-CM | POA: Diagnosis not present

## 2017-07-24 DIAGNOSIS — F419 Anxiety disorder, unspecified: Secondary | ICD-10-CM | POA: Diagnosis not present

## 2017-08-21 DIAGNOSIS — F419 Anxiety disorder, unspecified: Secondary | ICD-10-CM | POA: Diagnosis not present

## 2017-08-21 DIAGNOSIS — F0391 Unspecified dementia with behavioral disturbance: Secondary | ICD-10-CM | POA: Diagnosis not present

## 2017-08-29 DIAGNOSIS — L218 Other seborrheic dermatitis: Secondary | ICD-10-CM | POA: Diagnosis not present

## 2017-08-29 DIAGNOSIS — L821 Other seborrheic keratosis: Secondary | ICD-10-CM | POA: Diagnosis not present

## 2017-08-29 DIAGNOSIS — L814 Other melanin hyperpigmentation: Secondary | ICD-10-CM | POA: Diagnosis not present

## 2017-09-12 DIAGNOSIS — M1711 Unilateral primary osteoarthritis, right knee: Secondary | ICD-10-CM | POA: Diagnosis not present

## 2017-09-12 DIAGNOSIS — M1712 Unilateral primary osteoarthritis, left knee: Secondary | ICD-10-CM | POA: Diagnosis not present

## 2017-09-21 DIAGNOSIS — M1711 Unilateral primary osteoarthritis, right knee: Secondary | ICD-10-CM | POA: Diagnosis not present

## 2017-09-21 DIAGNOSIS — M1712 Unilateral primary osteoarthritis, left knee: Secondary | ICD-10-CM | POA: Diagnosis not present

## 2017-10-01 DIAGNOSIS — M79675 Pain in left toe(s): Secondary | ICD-10-CM | POA: Diagnosis not present

## 2017-10-01 DIAGNOSIS — M79674 Pain in right toe(s): Secondary | ICD-10-CM | POA: Diagnosis not present

## 2017-10-01 DIAGNOSIS — B351 Tinea unguium: Secondary | ICD-10-CM | POA: Diagnosis not present

## 2017-10-02 DIAGNOSIS — E0822 Diabetes mellitus due to underlying condition with diabetic chronic kidney disease: Secondary | ICD-10-CM | POA: Diagnosis not present

## 2017-10-10 DIAGNOSIS — N2581 Secondary hyperparathyroidism of renal origin: Secondary | ICD-10-CM | POA: Diagnosis not present

## 2017-10-10 DIAGNOSIS — I129 Hypertensive chronic kidney disease with stage 1 through stage 4 chronic kidney disease, or unspecified chronic kidney disease: Secondary | ICD-10-CM | POA: Diagnosis not present

## 2017-10-10 DIAGNOSIS — E1122 Type 2 diabetes mellitus with diabetic chronic kidney disease: Secondary | ICD-10-CM | POA: Diagnosis not present

## 2017-10-10 DIAGNOSIS — F0391 Unspecified dementia with behavioral disturbance: Secondary | ICD-10-CM | POA: Diagnosis not present

## 2017-10-10 DIAGNOSIS — R600 Localized edema: Secondary | ICD-10-CM | POA: Diagnosis not present

## 2017-10-10 DIAGNOSIS — N281 Cyst of kidney, acquired: Secondary | ICD-10-CM | POA: Diagnosis not present

## 2017-10-10 DIAGNOSIS — N183 Chronic kidney disease, stage 3 (moderate): Secondary | ICD-10-CM | POA: Diagnosis not present

## 2017-10-10 DIAGNOSIS — D751 Secondary polycythemia: Secondary | ICD-10-CM | POA: Diagnosis not present

## 2017-10-10 DIAGNOSIS — C61 Malignant neoplasm of prostate: Secondary | ICD-10-CM | POA: Diagnosis not present

## 2017-10-10 DIAGNOSIS — E611 Iron deficiency: Secondary | ICD-10-CM | POA: Diagnosis not present

## 2017-10-23 ENCOUNTER — Other Ambulatory Visit: Payer: Self-pay

## 2017-10-23 DIAGNOSIS — I872 Venous insufficiency (chronic) (peripheral): Secondary | ICD-10-CM

## 2017-10-31 DIAGNOSIS — N183 Chronic kidney disease, stage 3 (moderate): Secondary | ICD-10-CM | POA: Diagnosis not present

## 2017-10-31 DIAGNOSIS — R109 Unspecified abdominal pain: Secondary | ICD-10-CM | POA: Diagnosis not present

## 2017-10-31 DIAGNOSIS — R32 Unspecified urinary incontinence: Secondary | ICD-10-CM | POA: Diagnosis not present

## 2017-10-31 DIAGNOSIS — R300 Dysuria: Secondary | ICD-10-CM | POA: Diagnosis not present

## 2017-12-04 ENCOUNTER — Ambulatory Visit (HOSPITAL_COMMUNITY): Payer: PRIVATE HEALTH INSURANCE

## 2017-12-04 ENCOUNTER — Encounter: Payer: Medicare Other | Admitting: Vascular Surgery

## 2017-12-18 DIAGNOSIS — L218 Other seborrheic dermatitis: Secondary | ICD-10-CM | POA: Diagnosis not present

## 2017-12-18 DIAGNOSIS — L821 Other seborrheic keratosis: Secondary | ICD-10-CM | POA: Diagnosis not present

## 2017-12-18 DIAGNOSIS — L570 Actinic keratosis: Secondary | ICD-10-CM | POA: Diagnosis not present

## 2017-12-18 DIAGNOSIS — L814 Other melanin hyperpigmentation: Secondary | ICD-10-CM | POA: Diagnosis not present

## 2017-12-26 DIAGNOSIS — R531 Weakness: Secondary | ICD-10-CM | POA: Diagnosis not present

## 2017-12-31 DIAGNOSIS — B351 Tinea unguium: Secondary | ICD-10-CM | POA: Diagnosis not present

## 2017-12-31 DIAGNOSIS — M79675 Pain in left toe(s): Secondary | ICD-10-CM | POA: Diagnosis not present

## 2017-12-31 DIAGNOSIS — M79674 Pain in right toe(s): Secondary | ICD-10-CM | POA: Diagnosis not present

## 2018-01-02 DIAGNOSIS — I10 Essential (primary) hypertension: Secondary | ICD-10-CM | POA: Diagnosis not present

## 2018-01-07 DIAGNOSIS — R05 Cough: Secondary | ICD-10-CM | POA: Diagnosis not present

## 2018-01-11 DIAGNOSIS — N183 Chronic kidney disease, stage 3 (moderate): Secondary | ICD-10-CM | POA: Diagnosis not present

## 2018-01-11 DIAGNOSIS — R531 Weakness: Secondary | ICD-10-CM | POA: Diagnosis not present

## 2018-01-15 DIAGNOSIS — L821 Other seborrheic keratosis: Secondary | ICD-10-CM | POA: Diagnosis not present

## 2018-01-15 DIAGNOSIS — L814 Other melanin hyperpigmentation: Secondary | ICD-10-CM | POA: Diagnosis not present

## 2018-01-15 DIAGNOSIS — L570 Actinic keratosis: Secondary | ICD-10-CM | POA: Diagnosis not present

## 2018-01-15 DIAGNOSIS — L218 Other seborrheic dermatitis: Secondary | ICD-10-CM | POA: Diagnosis not present

## 2018-01-25 DIAGNOSIS — R05 Cough: Secondary | ICD-10-CM | POA: Diagnosis not present

## 2018-01-25 DIAGNOSIS — I10 Essential (primary) hypertension: Secondary | ICD-10-CM | POA: Diagnosis not present

## 2018-01-25 DIAGNOSIS — R0602 Shortness of breath: Secondary | ICD-10-CM | POA: Diagnosis not present

## 2018-01-29 ENCOUNTER — Inpatient Hospital Stay (HOSPITAL_COMMUNITY)
Admission: EM | Admit: 2018-01-29 | Discharge: 2018-02-01 | DRG: 641 | Disposition: A | Discharge status: Hospice | Payer: Medicare Other | Attending: Family Medicine | Admitting: Family Medicine

## 2018-01-29 ENCOUNTER — Emergency Department (HOSPITAL_COMMUNITY): Payer: Medicare Other

## 2018-01-29 ENCOUNTER — Encounter (HOSPITAL_COMMUNITY): Payer: Self-pay | Admitting: Emergency Medicine

## 2018-01-29 ENCOUNTER — Inpatient Hospital Stay (HOSPITAL_COMMUNITY): Payer: Medicare Other

## 2018-01-29 ENCOUNTER — Other Ambulatory Visit: Payer: Self-pay

## 2018-01-29 DIAGNOSIS — N183 Chronic kidney disease, stage 3 unspecified: Secondary | ICD-10-CM | POA: Diagnosis present

## 2018-01-29 DIAGNOSIS — Z7901 Long term (current) use of anticoagulants: Secondary | ICD-10-CM

## 2018-01-29 DIAGNOSIS — E785 Hyperlipidemia, unspecified: Secondary | ICD-10-CM | POA: Diagnosis present

## 2018-01-29 DIAGNOSIS — Z7984 Long term (current) use of oral hypoglycemic drugs: Secondary | ICD-10-CM

## 2018-01-29 DIAGNOSIS — R627 Adult failure to thrive: Secondary | ICD-10-CM | POA: Diagnosis present

## 2018-01-29 DIAGNOSIS — D696 Thrombocytopenia, unspecified: Secondary | ICD-10-CM | POA: Diagnosis present

## 2018-01-29 DIAGNOSIS — K219 Gastro-esophageal reflux disease without esophagitis: Secondary | ICD-10-CM | POA: Diagnosis present

## 2018-01-29 DIAGNOSIS — I872 Venous insufficiency (chronic) (peripheral): Secondary | ICD-10-CM | POA: Diagnosis present

## 2018-01-29 DIAGNOSIS — Z833 Family history of diabetes mellitus: Secondary | ICD-10-CM

## 2018-01-29 DIAGNOSIS — I2510 Atherosclerotic heart disease of native coronary artery without angina pectoris: Secondary | ICD-10-CM | POA: Diagnosis present

## 2018-01-29 DIAGNOSIS — Z79899 Other long term (current) drug therapy: Secondary | ICD-10-CM

## 2018-01-29 DIAGNOSIS — E870 Hyperosmolality and hypernatremia: Secondary | ICD-10-CM | POA: Diagnosis not present

## 2018-01-29 DIAGNOSIS — E1129 Type 2 diabetes mellitus with other diabetic kidney complication: Secondary | ICD-10-CM | POA: Diagnosis present

## 2018-01-29 DIAGNOSIS — D45 Polycythemia vera: Secondary | ICD-10-CM | POA: Diagnosis present

## 2018-01-29 DIAGNOSIS — I10 Essential (primary) hypertension: Secondary | ICD-10-CM | POA: Diagnosis not present

## 2018-01-29 DIAGNOSIS — Z7401 Bed confinement status: Secondary | ICD-10-CM | POA: Diagnosis not present

## 2018-01-29 DIAGNOSIS — I4891 Unspecified atrial fibrillation: Secondary | ICD-10-CM | POA: Diagnosis not present

## 2018-01-29 DIAGNOSIS — C946 Myelodysplastic disease, not classified: Secondary | ICD-10-CM | POA: Diagnosis present

## 2018-01-29 DIAGNOSIS — M1990 Unspecified osteoarthritis, unspecified site: Secondary | ICD-10-CM | POA: Diagnosis present

## 2018-01-29 DIAGNOSIS — E860 Dehydration: Secondary | ICD-10-CM | POA: Diagnosis present

## 2018-01-29 DIAGNOSIS — N400 Enlarged prostate without lower urinary tract symptoms: Secondary | ICD-10-CM | POA: Diagnosis present

## 2018-01-29 DIAGNOSIS — R0902 Hypoxemia: Secondary | ICD-10-CM | POA: Diagnosis not present

## 2018-01-29 DIAGNOSIS — R338 Other retention of urine: Secondary | ICD-10-CM | POA: Diagnosis present

## 2018-01-29 DIAGNOSIS — I370 Nonrheumatic pulmonary valve stenosis: Secondary | ICD-10-CM | POA: Diagnosis not present

## 2018-01-29 DIAGNOSIS — Z886 Allergy status to analgesic agent status: Secondary | ICD-10-CM

## 2018-01-29 DIAGNOSIS — Z885 Allergy status to narcotic agent status: Secondary | ICD-10-CM

## 2018-01-29 DIAGNOSIS — E1122 Type 2 diabetes mellitus with diabetic chronic kidney disease: Secondary | ICD-10-CM | POA: Diagnosis present

## 2018-01-29 DIAGNOSIS — R05 Cough: Secondary | ICD-10-CM

## 2018-01-29 DIAGNOSIS — E1159 Type 2 diabetes mellitus with other circulatory complications: Secondary | ICD-10-CM | POA: Diagnosis not present

## 2018-01-29 DIAGNOSIS — F0390 Unspecified dementia without behavioral disturbance: Secondary | ICD-10-CM | POA: Diagnosis present

## 2018-01-29 DIAGNOSIS — I5022 Chronic systolic (congestive) heart failure: Secondary | ICD-10-CM | POA: Diagnosis present

## 2018-01-29 DIAGNOSIS — Z880 Allergy status to penicillin: Secondary | ICD-10-CM

## 2018-01-29 DIAGNOSIS — Z7189 Other specified counseling: Secondary | ICD-10-CM

## 2018-01-29 DIAGNOSIS — N401 Enlarged prostate with lower urinary tract symptoms: Secondary | ICD-10-CM | POA: Diagnosis present

## 2018-01-29 DIAGNOSIS — Z96641 Presence of right artificial hip joint: Secondary | ICD-10-CM | POA: Diagnosis present

## 2018-01-29 DIAGNOSIS — I272 Pulmonary hypertension, unspecified: Secondary | ICD-10-CM | POA: Diagnosis present

## 2018-01-29 DIAGNOSIS — R059 Cough, unspecified: Secondary | ICD-10-CM

## 2018-01-29 DIAGNOSIS — R404 Transient alteration of awareness: Secondary | ICD-10-CM | POA: Diagnosis not present

## 2018-01-29 DIAGNOSIS — I130 Hypertensive heart and chronic kidney disease with heart failure and stage 1 through stage 4 chronic kidney disease, or unspecified chronic kidney disease: Secondary | ICD-10-CM | POA: Diagnosis present

## 2018-01-29 DIAGNOSIS — Z888 Allergy status to other drugs, medicaments and biological substances status: Secondary | ICD-10-CM

## 2018-01-29 DIAGNOSIS — R7989 Other specified abnormal findings of blood chemistry: Secondary | ICD-10-CM | POA: Diagnosis present

## 2018-01-29 DIAGNOSIS — I248 Other forms of acute ischemic heart disease: Secondary | ICD-10-CM | POA: Diagnosis present

## 2018-01-29 DIAGNOSIS — J069 Acute upper respiratory infection, unspecified: Secondary | ICD-10-CM | POA: Diagnosis present

## 2018-01-29 DIAGNOSIS — I080 Rheumatic disorders of both mitral and aortic valves: Secondary | ICD-10-CM | POA: Diagnosis present

## 2018-01-29 DIAGNOSIS — N179 Acute kidney failure, unspecified: Secondary | ICD-10-CM | POA: Diagnosis not present

## 2018-01-29 DIAGNOSIS — Z8249 Family history of ischemic heart disease and other diseases of the circulatory system: Secondary | ICD-10-CM

## 2018-01-29 DIAGNOSIS — Z993 Dependence on wheelchair: Secondary | ICD-10-CM

## 2018-01-29 DIAGNOSIS — E871 Hypo-osmolality and hyponatremia: Principal | ICD-10-CM | POA: Diagnosis present

## 2018-01-29 DIAGNOSIS — R778 Other specified abnormalities of plasma proteins: Secondary | ICD-10-CM | POA: Diagnosis present

## 2018-01-29 DIAGNOSIS — I252 Old myocardial infarction: Secondary | ICD-10-CM

## 2018-01-29 DIAGNOSIS — Z841 Family history of disorders of kidney and ureter: Secondary | ICD-10-CM

## 2018-01-29 DIAGNOSIS — R0602 Shortness of breath: Secondary | ICD-10-CM | POA: Diagnosis not present

## 2018-01-29 DIAGNOSIS — Z881 Allergy status to other antibiotic agents status: Secondary | ICD-10-CM

## 2018-01-29 DIAGNOSIS — G309 Alzheimer's disease, unspecified: Secondary | ICD-10-CM | POA: Diagnosis not present

## 2018-01-29 DIAGNOSIS — I739 Peripheral vascular disease, unspecified: Secondary | ICD-10-CM | POA: Diagnosis not present

## 2018-01-29 DIAGNOSIS — Z66 Do not resuscitate: Secondary | ICD-10-CM | POA: Diagnosis present

## 2018-01-29 DIAGNOSIS — Z882 Allergy status to sulfonamides status: Secondary | ICD-10-CM

## 2018-01-29 DIAGNOSIS — R4182 Altered mental status, unspecified: Secondary | ICD-10-CM | POA: Diagnosis present

## 2018-01-29 DIAGNOSIS — Z515 Encounter for palliative care: Secondary | ICD-10-CM | POA: Diagnosis present

## 2018-01-29 DIAGNOSIS — M2550 Pain in unspecified joint: Secondary | ICD-10-CM | POA: Diagnosis not present

## 2018-01-29 DIAGNOSIS — I340 Nonrheumatic mitral (valve) insufficiency: Secondary | ICD-10-CM | POA: Diagnosis not present

## 2018-01-29 DIAGNOSIS — N189 Chronic kidney disease, unspecified: Secondary | ICD-10-CM | POA: Diagnosis not present

## 2018-01-29 DIAGNOSIS — M48061 Spinal stenosis, lumbar region without neurogenic claudication: Secondary | ICD-10-CM | POA: Diagnosis present

## 2018-01-29 LAB — I-STAT CG4 LACTIC ACID, ED: LACTIC ACID, VENOUS: 2.98 mmol/L — AB (ref 0.5–1.9)

## 2018-01-29 LAB — CBC
HEMATOCRIT: 60.1 % — AB (ref 39.0–52.0)
Hemoglobin: 18.6 g/dL — ABNORMAL HIGH (ref 13.0–17.0)
MCH: 29.7 pg (ref 26.0–34.0)
MCHC: 30.9 g/dL (ref 30.0–36.0)
MCV: 96 fL (ref 80.0–100.0)
PLATELETS: 67 10*3/uL — AB (ref 150–400)
RBC: 6.26 MIL/uL — ABNORMAL HIGH (ref 4.22–5.81)
RDW: 14.7 % (ref 11.5–15.5)
WBC: 9.7 10*3/uL (ref 4.0–10.5)
nRBC: 0 % (ref 0.0–0.2)

## 2018-01-29 LAB — GLUCOSE, CAPILLARY
GLUCOSE-CAPILLARY: 149 mg/dL — AB (ref 70–99)
Glucose-Capillary: 142 mg/dL — ABNORMAL HIGH (ref 70–99)

## 2018-01-29 LAB — COMPREHENSIVE METABOLIC PANEL
ALT: 16 U/L (ref 0–44)
AST: 21 U/L (ref 15–41)
Albumin: 3.6 g/dL (ref 3.5–5.0)
Alkaline Phosphatase: 53 U/L (ref 38–126)
Anion gap: 11 (ref 5–15)
BILIRUBIN TOTAL: 1.9 mg/dL — AB (ref 0.3–1.2)
BUN: 35 mg/dL — AB (ref 8–23)
CHLORIDE: 114 mmol/L — AB (ref 98–111)
CO2: 25 mmol/L (ref 22–32)
Calcium: 10 mg/dL (ref 8.9–10.3)
Creatinine, Ser: 2.13 mg/dL — ABNORMAL HIGH (ref 0.61–1.24)
GFR, EST AFRICAN AMERICAN: 30 mL/min — AB (ref >60)
GFR, EST NON AFRICAN AMERICAN: 26 mL/min — AB (ref >60)
Glucose, Bld: 164 mg/dL — ABNORMAL HIGH (ref 70–99)
Potassium: 4.3 mmol/L (ref 3.5–5.1)
Sodium: 150 mmol/L — ABNORMAL HIGH (ref 135–145)
TOTAL PROTEIN: 7.2 g/dL (ref 6.5–8.1)

## 2018-01-29 LAB — I-STAT TROPONIN, ED: TROPONIN I, POC: 0.55 ng/mL — AB (ref 0.00–0.08)

## 2018-01-29 LAB — TROPONIN I: Troponin I: 0.35 ng/mL (ref <0.03)

## 2018-01-29 LAB — LIPASE, BLOOD: LIPASE: 21 U/L (ref 11–51)

## 2018-01-29 LAB — LACTIC ACID, PLASMA: Lactic Acid, Venous: 2.1 mmol/L (ref 0.5–1.9)

## 2018-01-29 MED ORDER — ACETAMINOPHEN 325 MG PO TABS: 650.0000 mg | ORAL_TABLET | Freq: Four times a day (QID) | ORAL | Status: DC | PRN

## 2018-01-29 MED ORDER — IPRATROPIUM-ALBUTEROL 0.5-2.5 (3) MG/3ML IN SOLN
3.0000 mL | Freq: Four times a day (QID) | RESPIRATORY_TRACT | Status: DC
  Administered 2018-01-29 – 2018-01-31 (×7): 3 mL via RESPIRATORY_TRACT
  Filled 2018-01-29 (×7): qty 3

## 2018-01-29 MED ORDER — FOLIC ACID 1 MG PO TABS
1.0000 mg | ORAL_TABLET | Freq: Every day | ORAL | Status: DC
  Administered 2018-01-30: 1 mg via ORAL
  Filled 2018-01-29: qty 1

## 2018-01-29 MED ORDER — IPRATROPIUM-ALBUTEROL 0.5-2.5 (3) MG/3ML IN SOLN: 3.0000 mL | RESPIRATORY_TRACT | Status: DC | PRN

## 2018-01-29 MED ORDER — LACTATED RINGERS IV BOLUS
1000.0000 mL | Freq: Once | INTRAVENOUS | Status: AC
  Administered 2018-01-29: 1000 mL via INTRAVENOUS

## 2018-01-29 MED ORDER — ALPRAZOLAM 0.25 MG PO TABS
0.2500 mg | ORAL_TABLET | Freq: Two times a day (BID) | ORAL | Status: DC
  Administered 2018-01-30: 0.25 mg via ORAL
  Filled 2018-01-29: qty 1

## 2018-01-29 MED ORDER — GLIPIZIDE ER 5 MG PO TB24
5.0000 mg | ORAL_TABLET | Freq: Every day | ORAL | Status: DC
  Administered 2018-01-30: 5 mg via ORAL
  Filled 2018-01-29: qty 1

## 2018-01-29 MED ORDER — IPRATROPIUM BROMIDE 0.02 % IN SOLN: 0.5000 mg | Freq: Four times a day (QID) | RESPIRATORY_TRACT | Status: DC

## 2018-01-29 MED ORDER — INSULIN ASPART 100 UNIT/ML ~~LOC~~ SOLN
0.0000 [IU] | Freq: Three times a day (TID) | SUBCUTANEOUS | Status: DC
  Administered 2018-01-30: 1 [IU] via SUBCUTANEOUS

## 2018-01-29 MED ORDER — NITROGLYCERIN 0.4 MG SL SUBL: 0.4000 mg | SUBLINGUAL_TABLET | SUBLINGUAL | Status: DC | PRN

## 2018-01-29 MED ORDER — ONDANSETRON HCL 4 MG PO TABS: 4.0000 mg | ORAL_TABLET | Freq: Four times a day (QID) | ORAL | Status: DC | PRN

## 2018-01-29 MED ORDER — ONDANSETRON HCL 4 MG/2ML IJ SOLN: 4.0000 mg | Freq: Four times a day (QID) | INTRAMUSCULAR | Status: DC | PRN

## 2018-01-29 MED ORDER — LACTATED RINGERS IV SOLN
INTRAVENOUS | Status: AC
  Administered 2018-01-30: 08:00:00 via INTRAVENOUS

## 2018-01-29 MED ORDER — ACETAMINOPHEN 650 MG RE SUPP: 650.0000 mg | Freq: Four times a day (QID) | RECTAL | Status: DC | PRN

## 2018-01-29 MED ORDER — TAMSULOSIN HCL 0.4 MG PO CAPS
0.4000 mg | ORAL_CAPSULE | Freq: Two times a day (BID) | ORAL | Status: DC
  Administered 2018-01-30: 0.4 mg via ORAL
  Filled 2018-01-29 (×2): qty 1

## 2018-01-29 MED ORDER — MIRTAZAPINE 15 MG PO TABS
15.0000 mg | ORAL_TABLET | Freq: Every day | ORAL | Status: DC
  Filled 2018-01-29: qty 1

## 2018-01-29 MED ORDER — ALBUTEROL SULFATE (2.5 MG/3ML) 0.083% IN NEBU: 2.5000 mg | INHALATION_SOLUTION | RESPIRATORY_TRACT | Status: DC | PRN

## 2018-01-29 MED ORDER — ALBUTEROL SULFATE (2.5 MG/3ML) 0.083% IN NEBU: 2.5000 mg | INHALATION_SOLUTION | Freq: Four times a day (QID) | RESPIRATORY_TRACT | Status: DC

## 2018-01-29 MED ORDER — FESOTERODINE FUMARATE ER 8 MG PO TB24
8.0000 mg | ORAL_TABLET | Freq: Every day | ORAL | Status: DC
  Administered 2018-01-30: 8 mg via ORAL
  Filled 2018-01-29: qty 1

## 2018-01-29 MED ORDER — METOPROLOL TARTRATE 25 MG PO TABS
25.0000 mg | ORAL_TABLET | Freq: Two times a day (BID) | ORAL | Status: DC
  Administered 2018-01-30: 25 mg via ORAL
  Filled 2018-01-29 (×2): qty 1

## 2018-01-29 MED ORDER — FINASTERIDE 5 MG PO TABS
5.0000 mg | ORAL_TABLET | Freq: Every day | ORAL | Status: DC
  Filled 2018-01-29: qty 1

## 2018-01-29 MED ORDER — DOCUSATE SODIUM 100 MG PO CAPS
100.0000 mg | ORAL_CAPSULE | Freq: Every day | ORAL | Status: DC
  Administered 2018-01-30: 100 mg via ORAL
  Filled 2018-01-29: qty 1

## 2018-01-29 MED ORDER — SODIUM CHLORIDE 0.9 % IV BOLUS
500.0000 mL | Freq: Once | INTRAVENOUS | Status: AC
  Administered 2018-01-29: 500 mL via INTRAVENOUS

## 2018-01-29 NOTE — ED Notes (Signed)
Attempted report x1, RN unavailable  

## 2018-01-29 NOTE — ED Notes (Signed)
ED TO INPATIENT HANDOFF REPORT  Name/Age/Gender Aaron Stephenson 84 y.o. male  Code Status    Code Status Orders  (From admission, onward)         Start     Ordered   01/29/18 1515  Do not attempt resuscitation (DNR)  Continuous    Question Answer Comment  In the event of cardiac or respiratory ARREST Do not call a "code blue"   In the event of cardiac or respiratory ARREST Do not perform Intubation, CPR, defibrillation or ACLS   In the event of cardiac or respiratory ARREST Use medication by any route, position, wound care, and other measures to relive pain and suffering. May use oxygen, suction and manual treatment of airway obstruction as needed for comfort.      01/29/18 1514        Code Status History    Date Active Date Inactive Code Status Order ID Comments User Context   10/06/2016 1904 10/09/2016 1632 DNR [id number]  Dorothe Pea, RN Inpatient   10/06/2016 1307 10/06/2016 1904 Full Code [id number]  Rondel Jumbo, PA-C ED   08/11/2016 2306 08/17/2016 1539 DNR [id number]  Lily Kocher, MD ED   08/11/2016 2122 08/11/2016 2306 DNR [id number]  Fredia Sorrow, MD ED   07/21/2015 1619 07/24/2015 1909 DNR [id number]  Bonnell Public, MD ED   07/21/2015 1524 07/21/2015 1619 DNR [id number]  Bonnell Public, MD ED   09/19/2014 0859 09/21/2014 1441 Full Code [id number]  Isaiah Serge, NP Inpatient      Home/SNF/Other Nursing Home  Chief Complaint Weakness  Level of Care/Admitting Diagnosis ED Disposition    ED Disposition Condition [facility] Area: [place] [[id number]]  Level of Care: Progressive [102]  Diagnosis: Hypernatremia [809983]  Admitting Physician: Reubin Milan [[id number]]  Attending Physician: Reubin Milan [[id number]]  Estimated length of stay: past midnight tomorrow  Certification:: I certify this patient will need inpatient services for at least 2 midnights  PT Class (Do Not Modify): Inpatient [101]  PT Acc Code (Do Not  Modify): Private [1]       Medical History Past Medical History:  Diagnosis Date  . BPH (benign prostatic hypertrophy) 09/21/2014  . Cellulitis in diabetic foot ([place]) 08/11/2016  . Chronic venous insufficiency   . CKD (chronic kidney disease)   . Dementia ([place])   . DM (diabetes mellitus), type 2 ([place])   . Enterococcus UTI   . Esophagitis   . GERD (gastroesophageal reflux disease)   . Hiatal hernia   . HTN (hypertension)   . Hyperlipidemia   . Kidney disease, chronic, stage III (GFR 30-59 ml/min) (HCC) 09/21/2014  . Migraine   . Myeloproliferative disease ([place])   . NSTEMI (non-ST elevated myocardial infarction) ([place]) 09/19/2014  . OA (osteoarthritis)   . Physical deconditioning   . Polycythemia vera ([place]) 02/20/2011   JAK 2 mutation +   . Polycythemia vera(238.4) 02/20/2011  . Protein calorie malnutrition ([place])   . Sepsis ([place]) 08/11/2016  . Spinal stenosis, lumbar   . Thrombocytopenia ([place])   . Toxic encephalopathy   . Type 2 diabetes mellitus with renal manifestations ([place]) 09/19/2014  . Vertigo     Allergies Allergies  Allergen Reactions  . Penicillins Anaphylaxis, Hives and Swelling    Per MAR  Has patient had a PCN reaction causing immediate rash, facial/tongue/throat swelling, SOB or lightheadedness with hypotension: unknown Has patient had a PCN reaction causing severe rash involving mucus membranes  or skin necrosis: unknown Has patient had a PCN reaction that required hospitalization: unknown Has patient had a PCN reaction occurring within the last 10 years: unknown If all of the above answers are "NO", then may proceed with Cephalosporin use. Pts family is unable to answer  . Aspirin Other (See Comments)  . Cardura [Doxazosin]     Per MAR  . Ciprofloxacin Other (See Comments)    Per MAR Joint swelling  . Macrodantin [Nitrofurantoin Macrocrystal] Other (See Comments)    Chest pains  . Other     "lubriderm pain pill?"  . Oxycodone Other (See Comments)    Pt  took OxyContin and tramadol together and almost "died"  . Sulfamethoxazole Other (See Comments)    Unknown - doesn't remember reaction  . Tramadol Other (See Comments)    Blank stare, no expression, becoming addictive  . Unisom [Doxylamine Succinate (Sleep)] Other (See Comments)    Hangover the next day  . Xanax [Alprazolam] Other (See Comments)    loopy    IV Location/Drains/Wounds Patient Lines/Drains/Airways Status   Active Line/Drains/Airways    Name:   Placement date:   Placement time:   Site:   Days:   Peripheral IV 01/29/18 Left Wrist   01/29/18    1133    Wrist   less than 1          Labs/Imaging Results for orders placed or performed during the hospital encounter of 01/29/18 (from the past 48 hour(s))  I-Stat CG4 Lactic Acid, ED     Status: Abnormal   Collection Time: 01/29/18 12:10 PM  Result Value Ref Range   Lactic Acid, Venous 2.98 (HH) 0.5 - 1.9 mmol/L   Comment NOTIFIED PHYSICIAN   I-stat troponin, ED     Status: Abnormal   Collection Time: 01/29/18 12:20 PM  Result Value Ref Range   Troponin i, poc 0.55 (HH) 0.00 - 0.08 ng/mL   Comment NOTIFIED PHYSICIAN    Comment 3            Comment: Due to the release kinetics of cTnI, a negative result within the first hours of the onset of symptoms does not rule out myocardial infarction with certainty. If myocardial infarction is still suspected, repeat the test at appropriate intervals.   CBC     Status: Abnormal   Collection Time: 01/29/18 12:38 PM  Result Value Ref Range   WBC 9.7 4.0 - 10.5 K/uL   RBC 6.26 (H) 4.22 - 5.81 MIL/uL   Hemoglobin 18.6 (H) 13.0 - 17.0 g/dL   HCT 60.1 (H) 39.0 - 52.0 %   MCV 96.0 80.0 - 100.0 fL   MCH 29.7 26.0 - 34.0 pg   MCHC 30.9 30.0 - 36.0 g/dL   RDW 14.7 11.5 - 15.5 %   Platelets 67 (L) 150 - 400 K/uL    Comment: REPEATED TO VERIFY PLATELET COUNT CONFIRMED BY SMEAR SPECIMEN CHECKED FOR CLOTS Immature Platelet Fraction may be clinically indicated, consider ordering  this additional test [id number]    nRBC 0.0 0.0 - 0.2 %    Comment: Performed at [facility], [place] [address] [place], [place] [address]  Comprehensive metabolic panel     Status: Abnormal   Collection Time: 01/29/18 12:38 PM  Result Value Ref Range   Sodium 150 (H) 135 - 145 mmol/L   Potassium 4.3 3.5 - 5.1 mmol/L   Chloride 114 (H) 98 - 111 mmol/L   CO2 25 22 - 32 mmol/L  Glucose, Bld 164 (H) 70 - 99 mg/dL   BUN 35 (H) 8 - 23 mg/dL   Creatinine, Ser 2.13 (H) 0.61 - 1.24 mg/dL   Calcium 10.0 8.9 - 10.3 mg/dL   Total Protein 7.2 6.5 - 8.1 g/dL   Albumin 3.6 3.5 - 5.0 g/dL   AST 21 15 - 41 U/L   ALT 16 0 - 44 U/L   Alkaline Phosphatase 53 38 - 126 U/L   Total Bilirubin 1.9 (H) 0.3 - 1.2 mg/dL   GFR calc non Af Amer 26 (L) >60 mL/min   GFR calc Af Amer 30 (L) >60 mL/min   Anion gap 11 5 - 15    Comment: Performed at [facility] [address], [place], [place] [address]  Lipase, blood     Status: None   Collection Time: 01/29/18 12:38 PM  Result Value Ref Range   Lipase 21 11 - 51 U/L    Comment: Performed at [facility], [place] [address], [place], [place] [address]   Dg Chest 2 View  Result Date: 01/29/2018 CLINICAL DATA:  URI.  Increasing weakness and shortness of breath. EXAM: CHEST - 2 VIEW COMPARISON:  03/11/2017. FINDINGS: Mediastinum and hilar structures normal. Stable cardiomegaly. Mild bilateral interstitial prominence and bilateral pleural effusions, left side greater right. Findings suggest CHF. Pneumonitis could not be excluded. Left base. Left base infiltrate/pneumonia can not be excluded. No pneumothorax. No acute bony abnormality. IMPRESSION: 1. Cardiomegaly with bilateral pulmonary interstitial prominence and bilateral pleural effusions, left side greater right. CHF could present in this fashion. 2. Left base atelectasis with elevation left hemidiaphragm. Left base infiltrate/pneumonia can not be excluded. Electronically Signed   By: Marcello Moores   Register   On: 01/29/2018 12:56    Pending Labs Unresulted Labs (From admission, onward)    Start     Ordered   01/30/18 2353  Basic metabolic panel  Daily,   R     01/29/18 1514   01/29/18 1700  Lactic acid, plasma  ONCE - STAT,   R     01/29/18 1511   01/29/18 1700  Troponin I - Now Then Q6H  Now then every 6 hours,   R     01/29/18 1511   01/29/18 1158  Urinalysis, Routine w reflex microscopic  ONCE - STAT,   R     01/29/18 1157   Signed and Held  Hemoglobin A1c  Tomorrow morning,   R    Comments:  To assess prior glycemic control    Signed and Held   Signed and Held  CBC with Differential/Platelet  Daily,   R     Signed and Held          Vitals/Pain Today's Vitals   01/29/18 1130 01/29/18 1407 01/29/18 1515 01/29/18 1517  BP:  123/70 126/90   Pulse:  74 75 71  Resp:  (!) 21 (!) 32 10  Temp:      TempSrc:      SpO2: 98% 97% 94% 96%    Isolation Precautions No active isolations  Medications Medications  lactated ringers infusion (has no administration in time range)  acetaminophen (TYLENOL) tablet 650 mg (has no administration in time range)    Or  acetaminophen (TYLENOL) suppository 650 mg (has no administration in time range)  ondansetron (ZOFRAN) tablet 4 mg (has no administration in time range)    Or  ondansetron (ZOFRAN) injection 4 mg (has no administration in time range)  ipratropium (  ATROVENT) nebulizer solution 0.5 mg (has no administration in time range)  albuterol (PROVENTIL) (2.5 MG/3ML) 0.083% nebulizer solution 2.5 mg (has no administration in time range)  albuterol (PROVENTIL) (2.5 MG/3ML) 0.083% nebulizer solution 2.5 mg (has no administration in time range)  sodium chloride 0.9 % bolus 500 mL (0 mLs Intravenous Stopped 01/29/18 1352)  lactated ringers bolus 1,000 mL (0 mLs Intravenous Stopped 01/29/18 1518)    Mobility walks with device

## 2018-01-29 NOTE — Progress Notes (Signed)
01/29/2018 1650 Received pt to room 4E-17 from ED.  Pt is Alert with confusion, does have H/O dementia.  Tele monitor applied and CCMD notified.  CHG bath given.  Does have some C/O bilateral foot pain.  Oriented pt and family to room, call light and bed.  Call bell in reach, family at bedside.   [place]

## 2018-01-29 NOTE — ED Provider Notes (Signed)
[facility] Emergency Department Provider Note MRN:  [id number]  Arrival date & time: 01/29/18     Chief Complaint   URI   History of Present Illness   Aaron Stephenson is a 84 y.o. year-old male with a history of dementia, diabetes presenting to the ED with chief complaint of URI.  Patient was diagnosed with bronchitis a week ago, recently completed a course of antibiotics for this.  Daughter explains that he has had a progressive worsening of his mental status for the past week.  Complaining of excessive fatigue, tiredness.  Continued mild dry cough.  No reported headache or vision change, no chest pain or shortness of breath, no abdominal pain, no dysuria, no vomiting or diarrhea.  I was unable to obtain an accurate HPI, PMH, or ROS due to the patient's dementia.  Review of Systems  Positive for recent bronchitis, increased confusion, fatigue.  Patient's Health History    Past Medical History:  Diagnosis Date  . BPH (benign prostatic hypertrophy) 09/21/2014  . Cellulitis in diabetic foot ([place]) 08/11/2016  . Chronic venous insufficiency   . CKD (chronic kidney disease)   . Dementia ([place])   . DM (diabetes mellitus), type 2 ([place])   . Enterococcus UTI   . Esophagitis   . GERD (gastroesophageal reflux disease)   . Hiatal hernia   . HTN (hypertension)   . Hyperlipidemia   . Kidney disease, chronic, stage III (GFR 30-59 ml/min) (HCC) 09/21/2014  . Migraine   . Myeloproliferative disease ([place])   . NSTEMI (non-ST elevated myocardial infarction) ([place]) 09/19/2014  . OA (osteoarthritis)   . Physical deconditioning   . Polycythemia vera ([place]) 02/20/2011   JAK 2 mutation +   . Polycythemia vera(238.4) 02/20/2011  . Protein calorie malnutrition ([place])   . Sepsis ([place]) 08/11/2016  . Spinal stenosis, lumbar   . Thrombocytopenia ([place])   . Toxic encephalopathy   . Type 2 diabetes mellitus with renal manifestations ([place]) 09/19/2014  . Vertigo     Past Surgical History:    Procedure Laterality Date  . LOWER LEG SOFT TISSUE TUMOR EXCISION    . TOTAL HIP ARTHROPLASTY  1993   right    Family History  Problem Relation Age of Onset  . Diabetes Mother   . Hypertension Mother   . Cancer Father   . Diabetes Brother   . Kidney failure Brother     Social History   Socioeconomic History  . Marital status: Married    Spouse name: Not on file  . Number of children: Not on file  . Years of education: Not on file  . Highest education level: Not on file  Occupational History  . Occupation: retired Geographical information systems officer  . Financial resource strain: Not on file  . Food insecurity:    Worry: Not on file    Inability: Not on file  . Transportation needs:    Medical: Not on file    Non-medical: Not on file  Tobacco Use  . Smoking status: Never Smoker  . Smokeless tobacco: Never Used  Substance and Sexual Activity  . Alcohol use: Yes    Comment: rarely  . Drug use: No  . Sexual activity: Not on file  Lifestyle  . Physical activity:    Days per week: Not on file    Minutes per session: Not on file  . Stress: Not on file  Relationships  . Social connections:    Talks on phone: Not on file  Gets together: Not on file    Attends religious service: Not on file    Active member of club or organization: Not on file    Attends meetings of clubs or organizations: Not on file    Relationship status: Not on file  . Intimate partner violence:    Fear of current or ex partner: Not on file    Emotionally abused: Not on file    Physically abused: Not on file    Forced sexual activity: Not on file  Other Topics Concern  . Not on file  Social History Narrative   Admitted to [facility] 08/17/16    Married -Dee   Never smoked   Alcohol -rarely   DNR     Physical Exam  Vital Signs and Nursing Notes reviewed Vitals:   01/29/18 1515 01/29/18 1517  BP: 126/90   Pulse: 75 71  Resp: (!) 32 10  Temp:    SpO2: 94% 96%    CONSTITUTIONAL:  Chronically ill-appearing, NAD, appears tired NEURO:  Alert and oriented x 3, no focal deficits EYES:  eyes equal and reactive ENT/NECK:  no LAD, no JVD CARDIO: Regular rate, well-perfused, normal S1 and S2 PULM:  CTAB no wheezing or rhonchi GI/GU:  normal bowel sounds, non-distended, non-tender MSK/SPINE:  No gross deformities, no edema SKIN:  no rash, atraumatic PSYCH:  Appropriate speech and behavior  Diagnostic and Interventional Summary    EKG Interpretation  Date/Time:  Tuesday January 29 2018 11:27:39 EST Ventricular Rate:  86 PR Interval:    QRS Duration: 108 QT Interval:  523 QTC Calculation: 626 R Axis:   162 Text Interpretation:  Atrial fibrillation Abnormal lateral Q waves Anterior infarct, old Prolonged QT interval Confirmed by Gerlene Fee [phone]) on 01/29/2018 12:34:05 PM      Labs Reviewed  CBC - Abnormal; Notable for the following components:      Result Value   RBC 6.26 (*)    Hemoglobin 18.6 (*)    HCT 60.1 (*)    Platelets 67 (*)    All other components within normal limits  COMPREHENSIVE METABOLIC PANEL - Abnormal; Notable for the following components:   Sodium 150 (*)    Chloride 114 (*)    Glucose, Bld 164 (*)    BUN 35 (*)    Creatinine, Ser 2.13 (*)    Total Bilirubin 1.9 (*)    GFR calc non Af Amer 26 (*)    GFR calc Af Amer 30 (*)    All other components within normal limits  I-STAT TROPONIN, ED - Abnormal; Notable for the following components:   Troponin i, poc 0.55 (*)    All other components within normal limits  I-STAT CG4 LACTIC ACID, ED - Abnormal; Notable for the following components:   Lactic Acid, Venous 2.98 (*)    All other components within normal limits  LIPASE, BLOOD  URINALYSIS, ROUTINE W REFLEX MICROSCOPIC  LACTIC ACID, PLASMA  TROPONIN I  TROPONIN I    DG Chest 2 View  Final Result    CT HEAD WO CONTRAST    (Results Pending)    Medications  lactated ringers infusion (has no administration in time range)    acetaminophen (TYLENOL) tablet 650 mg (has no administration in time range)    Or  acetaminophen (TYLENOL) suppository 650 mg (has no administration in time range)  ondansetron (ZOFRAN) tablet 4 mg (has no administration in time range)    Or  ondansetron (ZOFRAN) injection  4 mg (has no administration in time range)  ipratropium (ATROVENT) nebulizer solution 0.5 mg (has no administration in time range)  albuterol (PROVENTIL) (2.5 MG/3ML) 0.083% nebulizer solution 2.5 mg (has no administration in time range)  albuterol (PROVENTIL) (2.5 MG/3ML) 0.083% nebulizer solution 2.5 mg (has no administration in time range)  sodium chloride 0.9 % bolus 500 mL (0 mLs Intravenous Stopped 01/29/18 1352)  lactated ringers bolus 1,000 mL (0 mLs Intravenous Stopped 01/29/18 1518)     Procedures Critical Care  ED Course and Medical Decision Making  I have reviewed the triage vital signs and the nursing notes.  Pertinent labs & imaging results that were available during my care of the patient were reviewed by me and considered in my medical decision making (see below for details).  Functional decline in this 84 year old male with dementia, recent bronchitis, considering secondary bacterial pneumonia, metabolic disarray, UTI, no neurological deficits to suggest CNS pathology.  EKG without concerning ischemic features, first troponin 0.55, favoring type II, will continue evaluation to determine cause.  Labs reveal AKI, hypernatremia, likely all related to dehydration.  Admitted to hospital service for further care and evaluation.  Barth Kirks. Sedonia Small, [place] [email or web address]  Final Clinical Impressions(s) / ED Diagnoses     ICD-10-CM   1. AKI (acute kidney injury) ([place]) N17.9   2. Cough R05 DG Chest 2 View    DG Chest 2 View  3. Hypernatremia E87.0     ED Discharge Orders    None         Maudie Flakes, MD 01/29/18 [phone]

## 2018-01-29 NOTE — ED Notes (Signed)
Hospitalist at bedside.  Pt still has not produced any urine at this time.

## 2018-01-29 NOTE — ED Triage Notes (Signed)
Pt presents to ED via [facility] from [facility]. Pt has had a URI and has been on antibiotics. Over the past week nursing staff has reported increased weakness.  BP 126/67 HR 72 RR 14 97% 2L CBG 186

## 2018-01-29 NOTE — ED Notes (Addendum)
Pt's family member instructed to alert staff if/when Pt produces urine. Pt's family member verbalized understanding.

## 2018-01-29 NOTE — ED Notes (Signed)
Pt put on condom catheter upon arrival.  EKG captured.  I-Stat on rocker in [facility].  Family arrived at bedside.

## 2018-01-29 NOTE — H&P (Signed)
History and Physical    Aaron Stephenson VVO:[id number] DOB: 1924/02/25 DOA: 01/29/2018  PCP: System, Pcp Not In   Patient coming from: [facility] home.  I have personally briefly reviewed patient's old medical records in [place]  Chief Complaint: AMS.  HPI: Aaron Stephenson is a 84 y.o. male with medical history significant of BPH, history of diabetic foot cellulitis, chronic venous insufficiency, chronic kidney disease, dementia, type 2 diabetes, history of enterococcus UTI, GERD/hiatal hernia/history of esophagitis, hypertension, hyperlipidemia, migraine headaches, CAD, history of NSTEMI, osteoarthritis, physical deconditioning, polycythemia vera due to JAK2 mutation, protein calorie malnutrition, history of unspecified sepsis, spinal lumbar stenosis, thrombocytopenia, vertigo who was brought from the nursing home facility reviewed to progressively worse decline in mentation, weakness and decreased oral intake for the past 2 to 3 weeks.  He is unable to provide further history at this time.  ED Course: Initial vital signs temperature 99.8 F, pulse 80, respirations 25, blood pressure 123/84 mmHg and O2 sat 98% on room air.  The patient was given 500 mL NS bolus in the emergency department.  I ordered at 1000 mL LR bolus as well.  White count was 9.7, hemoglobin 18.6 g/dL and platelets 67.  I-STAT troponin was 0.55 ng/mL.  Sodium 150, chloride 114 and lactic acid is 2.9 a mmol/L.  BUN was 35, creatinine 2.13 (Baseline creatinine is usually around 1.6-1.7) and glucose 164 mg/dL.  The rest of the electrolytes and hepatic function tests are within normal limits.    Chest radiograph shows cardiomegaly with bilateral pulmonary interstitial prominence and pleural effusions greater on the left.  There is left base atelectasis with elevation of the left hemidiaphragm and possible infiltrate/pneumonia could not be excluded on LLL.  Please see images on full radiology report  for further detail.  Review of Systems: Unable to obtain..   Past Medical History:  Diagnosis Date  . BPH (benign prostatic hypertrophy) 09/21/2014  . Cellulitis in diabetic foot ([place]) 08/11/2016  . Chronic venous insufficiency   . CKD (chronic kidney disease)   . Dementia ([place])   . DM (diabetes mellitus), type 2 ([place])   . Enterococcus UTI   . Esophagitis   . GERD (gastroesophageal reflux disease)   . Hiatal hernia   . HTN (hypertension)   . Hyperlipidemia   . Kidney disease, chronic, stage III (GFR 30-59 ml/min) (HCC) 09/21/2014  . Migraine   . Myeloproliferative disease ([place])   . NSTEMI (non-ST elevated myocardial infarction) ([place]) 09/19/2014  . OA (osteoarthritis)   . Physical deconditioning   . Polycythemia vera ([place]) 02/20/2011   JAK 2 mutation +   . Polycythemia vera(238.4) 02/20/2011  . Protein calorie malnutrition ([place])   . Sepsis ([place]) 08/11/2016  . Spinal stenosis, lumbar   . Thrombocytopenia ([place])   . Toxic encephalopathy   . Type 2 diabetes mellitus with renal manifestations ([place]) 09/19/2014  . Vertigo     Past Surgical History:  Procedure Laterality Date  . LOWER LEG SOFT TISSUE TUMOR EXCISION    . TOTAL HIP ARTHROPLASTY  1993   right     reports that he has never smoked. He has never used smokeless tobacco. He reports current alcohol use. He reports that he does not use drugs.  Allergies  Allergen Reactions  . Penicillins Anaphylaxis, Hives and Swelling    Per MAR  Has patient had a PCN reaction causing immediate rash, facial/tongue/throat swelling, SOB or lightheadedness with hypotension: unknown Has patient had a PCN reaction  causing severe rash involving mucus membranes or skin necrosis: unknown Has patient had a PCN reaction that required hospitalization: unknown Has patient had a PCN reaction occurring within the last 10 years: unknown If all of the above answers are "NO", then may proceed with Cephalosporin use. Pts family is unable to answer  .  Aspirin Other (See Comments)  . Cardura [Doxazosin]     Per MAR  . Ciprofloxacin Other (See Comments)    Per MAR Joint swelling  . Macrodantin [Nitrofurantoin Macrocrystal] Other (See Comments)    Chest pains  . Other     "lubriderm pain pill?"  . Oxycodone Other (See Comments)    Pt took OxyContin and tramadol together and almost "died"  . Sulfamethoxazole Other (See Comments)    Unknown - doesn't remember reaction  . Tramadol Other (See Comments)    Blank stare, no expression, becoming addictive  . Unisom [Doxylamine Succinate (Sleep)] Other (See Comments)    Hangover the next day  . Xanax [Alprazolam] Other (See Comments)    loopy    Family History  Problem Relation Age of Onset  . Diabetes Mother   . Hypertension Mother   . Cancer Father   . Diabetes Brother   . Kidney failure Brother    Prior to Admission medications   Medication Sig Start Date End Date Taking? Authorizing Provider  acetaminophen (TYLENOL) 650 MG CR tablet Take 650 mg by mouth 3 (three) times daily.     [provider]  apixaban (ELIQUIS) 2.5 MG TABS tablet Take 2.5 mg by mouth 2 (two) times daily.    [provider]  atorvastatin (LIPITOR) 10 MG tablet Take 10 mg by mouth at bedtime.    [provider]  benzonatate (TESSALON) 100 MG capsule Take 1 capsule (100 mg total) by mouth 3 (three) times daily as needed for cough. 03/11/17   Virgel Manifold, MD  docusate sodium (COLACE) 100 MG capsule Take 100 mg by mouth daily.    [provider]  doxycycline (VIBRAMYCIN) 100 MG capsule Take 1 capsule (100 mg total) by mouth 2 (two) times daily. 03/11/17   Virgel Manifold, MD  finasteride (PROSCAR) 5 MG tablet Take 5 mg by mouth at bedtime.  06/16/14   [provider]  glipiZIDE (GLUCOTROL XL) 5 MG 24 hr tablet Take 5 mg by mouth daily.    [provider]  Guaifenesin 1200 MG TB12 Take 1 tablet by mouth 2 (two) times daily.    [provider]    levETIRAcetam (KEPPRA) 500 MG tablet Take 1 tablet (500 mg total) by mouth 2 (two) times daily. 10/09/16   Patrecia Pour, MD  losartan (COZAAR) 25 MG tablet Take 1 tablet (25 mg total) by mouth daily. 10/26/16   Tanda Rockers, MD  metoprolol tartrate (LOPRESSOR) 25 MG tablet Take 25 mg by mouth 2 (two) times daily.    [provider]  mirtazapine (REMERON) 15 MG tablet Take 15 mg by mouth at bedtime.    [provider]  nitroGLYCERIN (NITROSTAT) 0.4 MG SL tablet Place 1 tablet (0.4 mg total) under the tongue every 5 (five) minutes x 3 doses as needed for chest pain. 09/21/14   Jacolyn Reedy, MD  sulfamethoxazole-trimethoprim (BACTRIM DS,SEPTRA DS) 800-160 MG tablet Take 1 tablet by mouth 2 (two) times daily.    [provider]  tamsulosin (FLOMAX) 0.4 MG CAPS capsule Take 0.4 mg by mouth 2 (two) times daily.     [provider]  tolterodine (DETROL LA) 4 MG 24 hr capsule Take 4 mg by mouth at bedtime.    [provider]  torsemide (DEMADEX) 10 MG tablet Take 10 mg by mouth daily.    [provider]    Physical Exam: Vitals:   01/29/18 1130 01/29/18 1407 01/29/18 1515 01/29/18 1517  BP:  123/70 126/90   Pulse:  74 75 71  Resp:  (!) 21 (!) 32 10  Temp:      TempSrc:      SpO2: 98% 97% 94% 96%    Constitutional: Obtunded, but in NAD.  Looks chronically ill. Eyes: PERRL, lids and conjunctivae mildly injected. ENMT: Mucous membranes are severely dry. Posterior pharynx clear of any exudate or lesions. Neck: normal, supple, no masses, no thyromegaly Respiratory: Decreased breath sounds in bases, otherwise clear to auscultation bilaterally, no wheezing, no crackles. Normal respiratory effort. No accessory muscle use.  Cardiovascular: Regular rate and rhythm, no murmurs / rubs / gallops. No extremity edema. 2+ pedal pulses. No carotid bruits.  Abdomen: Soft, no tenderness, no masses palpated. No hepatosplenomegaly. Bowel sounds positive.   Musculoskeletal: no clubbing / cyanosis. Good ROM, no contractures. Normal muscle tone.  Skin: Stage I pressure ulcer, but unable to fully evaluate. Neurologic: CN 2-12 grossly intact.  Moves all extremities.  Unable to fully evaluate. Psychiatric: Obtunded.    Labs on Admission: I have personally reviewed following labs and imaging studies  CBC: Recent Labs  Lab 01/29/18 1238  WBC 9.7  HGB 18.6*  HCT 60.1*  MCV 96.0  PLT 67*   Basic Metabolic Panel: Recent Labs  Lab 01/29/18 1238  NA 150*  K 4.3  CL 114*  CO2 25  GLUCOSE 164*  BUN 35*  CREATININE 2.13*  CALCIUM 10.0   GFR: CrCl cannot be calculated (Unknown ideal weight.). Liver Function Tests: Recent Labs  Lab 01/29/18 1238  AST 21  ALT 16  ALKPHOS 53  BILITOT 1.9*  PROT 7.2  ALBUMIN 3.6   Recent Labs  Lab 01/29/18 1238  LIPASE 21   No results for input(s): AMMONIA in the last 168 hours. Coagulation Profile: No results for input(s): INR, PROTIME in the last 168 hours. Cardiac Enzymes: No results for input(s): CKTOTAL, CKMB, CKMBINDEX, TROPONINI in the last 168 hours. BNP (last 3 results) No results for input(s): PROBNP in the last 8760 hours. HbA1C: No results for input(s): HGBA1C in the last 72 hours. CBG: No results for input(s): GLUCAP in the last 168 hours. Lipid Profile: No results for input(s): CHOL, HDL, LDLCALC, TRIG, CHOLHDL, LDLDIRECT in the last 72 hours. Thyroid Function Tests: No results for input(s): TSH, T4TOTAL, FREET4, T3FREE, THYROIDAB in the last 72 hours. Anemia Panel: No results for input(s): VITAMINB12, FOLATE, FERRITIN, TIBC, IRON, RETICCTPCT in the last 72 hours. Urine analysis:    Component Value Date/Time   COLORURINE YELLOW 03/11/2017 [place] 03/11/2017 1745   LABSPEC 1.011 03/11/2017 1745   PHURINE 5.0 03/11/2017 1745   GLUCOSEU NEGATIVE 03/11/2017 1745   HGBUR NEGATIVE 03/11/2017 1745   BILIRUBINUR NEGATIVE 03/11/2017 1745   KETONESUR  NEGATIVE 03/11/2017 1745   PROTEINUR NEGATIVE 03/11/2017 1745   UROBILINOGEN 1.0 09/19/2014 1610   NITRITE NEGATIVE 03/11/2017 1745   LEUKOCYTESUR NEGATIVE 03/11/2017 1745    Radiological Exams on Admission: Dg Chest 2 View  Result Date: 01/29/2018 CLINICAL DATA:  URI.  Increasing weakness and shortness of breath. EXAM: CHEST - 2 VIEW COMPARISON:  03/11/2017. FINDINGS: Mediastinum and hilar structures  normal. Stable cardiomegaly. Mild bilateral interstitial prominence and bilateral pleural effusions, left side greater right. Findings suggest CHF. Pneumonitis could not be excluded. Left base. Left base infiltrate/pneumonia can not be excluded. No pneumothorax. No acute bony abnormality. IMPRESSION: 1. Cardiomegaly with bilateral pulmonary interstitial prominence and bilateral pleural effusions, left side greater right. CHF could present in this fashion. 2. Left base atelectasis with elevation left hemidiaphragm. Left base infiltrate/pneumonia can not be excluded. Electronically Signed   By: Marcello Moores  Register   On: 01/29/2018 12:56   10/08/2016 echocardiogram completed ------------------------------------------------------------------- LV EF: 50% -   55%  ------------------------------------------------------------------- History:   PMH:  Syncope.  Risk factors:  Hypertension. Diabetes mellitus. Dyslipidemia.  ------------------------------------------------------------------- Study Conclusions  - Left ventricle: The cavity size was normal. There was moderate   concentric hypertrophy. Systolic function was normal. The   estimated ejection fraction was in the range of 50% to 55%. Wall   motion was normal; there were no regional wall motion   abnormalities. There was a reduced contribution of atrial   contraction to ventricular filling, due to increased ventricular   diastolic pressure or atrial contractile dysfunction. Doppler   parameters are consistent with a reversible restrictive  pattern,   indicative of decreased left ventricular diastolic compliance   and/or increased left atrial pressure (grade 3 diastolic   dysfunction). Doppler parameters are consistent with high   ventricular filling pressure. - Aortic valve: Moderately calcified annulus. Trileaflet;   moderately thickened, mildly calcified leaflets. There was   moderate regurgitation. Regurgitation pressure half-time: 289 ms. - Mitral valve: Calcified annulus. There was mild to moderate   regurgitation. Valve area by pressure half-time: 1.75 cm^2. Valve   area by continuity equation (using LVOT flow): 2.37 cm^2.   Effective regurgitant orifice (PISA): 0.3 cm^2. Regurgitant   volume (PISA): 44 ml. - Left atrium: The atrium was moderately dilated. - Tricuspid valve: There was trivial regurgitation. - Pulmonary arteries: PA peak pressure: 48 mm Hg (S).  Impressions:  - The right ventricular systolic pressure was increased consistent   with moderate pulmonary hypertension.  EKG: Independently reviewed. Vent. rate 86 BPM PR interval * ms QRS duration 108 ms QT/QTc 523/626 ms P-R-T axes * 162 19 Atrial fibrillation Abnormal lateral Q waves Anterior infarct, old Prolonged QT interval  Assessment/Plan Principal Problem:   Hypernatremia Free water deficit is 2.5 L. Continue home hypotonic IV fluids. Follow-up intake and output. Follow-up sodium, renal function and electrolytes.  Active Problems:   AKI (acute kidney injury) ([place])   Kidney disease, chronic, stage III (GFR 30-59 ml/min) (HCC) Hold diuretics for now. Continue IV fluids. Monitor daily weights, intake, output, renal function and electrolytes    Elevated troponin Likely due to dehydration and demand ischemia.  This could also be residual troponin elevation from a silent event within the last few days.  Will trend troponin level and check echocardiogram.    Chronic systolic CHF (congestive heart failure) (HCC) Hold diuretic this  time since the patient is volume depleted. Continue metoprolol 25 mg p.o. twice daily.    Polycythemia vera ([place]) On apixaban. Monitor hematocrit and hemoglobin.    Type 2 diabetes mellitus with renal manifestations (HCC) Carbohydrate modified diet. Continue glipizide 5 mg p.o. daily. CBG monitoring with regular insulin sliding scale while in the hospital.    Hyperlipidemia Hold statin for now.    Benign prostatic hyperplasia Continue Flomax 0.4 mg p.o. daily.    GERD (gastroesophageal reflux disease) Protonix 40 mg p.o. daily.    Thrombocytopenia (  HCC) Monitor platelets.    Dementia ([place]) Supportive care.     DVT prophylaxis: On apixaban. Code Status: DNR. Family Communication: His 2 daughters and son-in-law were present in the room. Disposition Plan: Admit for IV hydration, troponin level trending and further work-up. Consults called:  Admission status: Inpatient/stepdown.   Reubin Milan MD [facility]   If 7PM-7AM, please contact night-coverage [email or web address] Password [facility]  01/29/2018, 3:37 PM

## 2018-01-29 NOTE — ED Notes (Signed)
Patient transported to X-ray 

## 2018-01-30 LAB — CBC WITH DIFFERENTIAL/PLATELET
Abs Immature Granulocytes: 0.02 10*3/uL (ref 0.00–0.07)
Basophils Absolute: 0 10*3/uL (ref 0.0–0.1)
Basophils Relative: 1 %
Eosinophils Absolute: 0.3 10*3/uL (ref 0.0–0.5)
Eosinophils Relative: 4 %
HCT: 52.1 % — ABNORMAL HIGH (ref 39.0–52.0)
HEMOGLOBIN: 16.5 g/dL (ref 13.0–17.0)
Immature Granulocytes: 0 %
Lymphocytes Relative: 22 %
Lymphs Abs: 1.5 10*3/uL (ref 0.7–4.0)
MCH: 29.5 pg (ref 26.0–34.0)
MCHC: 31.7 g/dL (ref 30.0–36.0)
MCV: 93.2 fL (ref 80.0–100.0)
Monocytes Absolute: 0.6 10*3/uL (ref 0.1–1.0)
Monocytes Relative: 9 %
NEUTROS PCT: 64 %
Neutro Abs: 4.2 10*3/uL (ref 1.7–7.7)
Platelets: 54 10*3/uL — ABNORMAL LOW (ref 150–400)
RBC: 5.59 MIL/uL (ref 4.22–5.81)
RDW: 14.2 % (ref 11.5–15.5)
WBC: 6.6 10*3/uL (ref 4.0–10.5)
nRBC: 0 % (ref 0.0–0.2)

## 2018-01-30 LAB — BASIC METABOLIC PANEL
Anion gap: 14 (ref 5–15)
BUN: 34 mg/dL — ABNORMAL HIGH (ref 8–23)
CHLORIDE: 110 mmol/L (ref 98–111)
CO2: 24 mmol/L (ref 22–32)
Calcium: 9.2 mg/dL (ref 8.9–10.3)
Creatinine, Ser: 2.01 mg/dL — ABNORMAL HIGH (ref 0.61–1.24)
GFR calc Af Amer: 33 mL/min — ABNORMAL LOW (ref >60)
GFR calc non Af Amer: 28 mL/min — ABNORMAL LOW (ref >60)
GLUCOSE: 134 mg/dL — AB (ref 70–99)
Potassium: 4 mmol/L (ref 3.5–5.1)
Sodium: 148 mmol/L — ABNORMAL HIGH (ref 135–145)

## 2018-01-30 LAB — HEMOGLOBIN A1C
Hgb A1c MFr Bld: 6.7 % — ABNORMAL HIGH (ref 4.8–5.6)
Mean Plasma Glucose: 145.59 mg/dL

## 2018-01-30 LAB — URINALYSIS, ROUTINE W REFLEX MICROSCOPIC
Bilirubin Urine: NEGATIVE
Glucose, UA: NEGATIVE mg/dL
Hgb urine dipstick: NEGATIVE
Ketones, ur: NEGATIVE mg/dL
Leukocytes, UA: NEGATIVE
NITRITE: NEGATIVE
Protein, ur: NEGATIVE mg/dL
Specific Gravity, Urine: 1.014 (ref 1.005–1.030)
pH: 6 (ref 5.0–8.0)

## 2018-01-30 LAB — GLUCOSE, CAPILLARY
Glucose-Capillary: 116 mg/dL — ABNORMAL HIGH (ref 70–99)
Glucose-Capillary: 128 mg/dL — ABNORMAL HIGH (ref 70–99)
Glucose-Capillary: 91 mg/dL (ref 70–99)
Glucose-Capillary: 112 mg/dL — ABNORMAL HIGH (ref 70–99)

## 2018-01-30 LAB — TROPONIN I: Troponin I: 0.31 ng/mL (ref <0.03)

## 2018-01-30 MED ORDER — LACTATED RINGERS IV SOLN
INTRAVENOUS | Status: AC
  Administered 2018-01-31: 75 mL/h via INTRAVENOUS

## 2018-01-30 NOTE — Progress Notes (Signed)
PROGRESS NOTE    Aaron Stephenson  YHC:[id number] DOB: 12-Aug-1924 DOA: 01/29/2018 PCP: System, Pcp Not In   Brief Narrative: 84 year old male with history of diabetic foot cellulitis, chronic venous insufficiency, CKD, dementia type 2 diabetes, history of enterococcus UTI, GERD/hiatal hernia/history of esophagitis, hypertension, hyperlipidemia, migraine headache, CAD, physical deconditioning, polycythemia vera due to Jak 2 mutation, protein calorie malnutrition, history of unspecified sepsis, spinal lumbar stenosis, thrombocytopenia, vertigo brought from the nursing facility due to progressively worsening decline in his mental status and with progressive weakness decreased oral intake for the past 2 to 3 weeks. In the ER patient was unable to provide history, blood pressure is stable saturating 90% on room air, labs showed hyponatremia was given IV fluid boluses.  Also had troponin of 0.5, lactic acid of 2.9 creatinine 2.13 baseline around 1.6-1.7.  Assessment & Plan:   Hypernatremia: Suspecting from decreased oral intake in the setting of dementia.  Sodium improving on IV fluids.  Continue the same, monitor electrolytes.  Given his dementia likely has high chances of recurrence.  Patient never wished to have feeding tube placed, as per the daughter agreeable for palliative care evaluation.  Type 2 diabetes mellitus with renal manifestations: Sugar is stable continue sliding scale and monitor Accu-Cheks. Hold OHA.  Episodes of coughing while drinking per daughter in hospital.  Speech has been consulted. keep NPO for now.  Hyperlipidemia: On statin at home, currently on hold.  BPH/bladder dysfunction-p needs on Flomax and Toviaz at home -resume slowly.   Mild AKI on CKD stage III: Baseline creatinine 1.6-1.7.  Hold diuretics, Cont gentle IV fluid hydration.  Creatinine at 2.0 from 2.1.  Monitor labs.  Elevated troponin.  Troponin on admission 0.5 down trended to 0.35 and 0.31, Suspect demand  ischemia from dehydration, chronic CHF renal failure.  Patient has no chest pain, not in distress.  Last echo EF 50 to 55% in August 2018.  Will order echocardiogram.  Chronic systolic DVV:OHYW echo EF 50 to 55% in August 2018.Stable, holding diuretics due to need for IV fluids.  Continues beta-blocker.  GERD : Protonix.  Thrombocytopenia , chronic: plt at 54k,  Monitor. Caution with coagulation. Polycythemia vera : Hemoglobin 16.5 g.  Stable. Per pharmacy Eliquis has been discontinued prior to admission. F/u with his hem/onc  Dementia patient has been progressively declining since Thanksgiving.  Palliative care consulted.  Continue supportive care.  Patient has been Remeron at bedtime and Xanax twice daily, resume slowly,once abel to take po.  DVT prophylaxis: Add SCD.  Avoid heparin/Lovenox due to his thrombocytopenia with plt in 54k Code Status: DNR Family Communication: Daughter POA at bedside Disposition Plan: Back to memory unit when stable  Consultants: none  Procedures: none  Antimicrobials:  Subjective: He is weak, frail, resting well. POA daughter at bedside, she thinks he appeared much better today. Per daughter had some coughing while drinking BOOST. He appears sleepy.  Objective: Vitals:   01/30/18 0611 01/30/18 0846 01/30/18 1014 01/30/18 1155  BP: (!) 123/57  123/78 115/71  Pulse: 65  71 67  Resp: 17   20  Temp: 97.6 F (36.4 C)     TempSrc: Oral     SpO2: 96% 97%  96%    Intake/Output Summary (Last 24 hours) at 01/30/2018 1156 Last data filed at 01/30/2018 0100 Gross per 24 hour  Intake 0 ml  Output -  Net 0 ml   There were no vitals filed for this visit. Weight change:   There is no  height or weight on file to calculate BMI.  Examination:  General exam: Appears calm, weak, frail.Not in distress. HEENT:PERRL,Oral mucosa moist, Ear/Nose normal on gross exam Respiratory system: Bilateral equal air entry, normal vesicular breath sounds, no wheezes or  crackles  Cardiovascular system: S1 & S2 heard,No JVD, murmurs.No pedal edema. Gastrointestinal system: Abdomen is  soft, non tender, non distended, BS +  Nervous System:Alert and oriented x1, lethargic, weak, sleepy Extremities: No edema, no clubbing,distal peripheral pulses palpable. Skin: No rashes, lesions,no icterus MSK: Normal muscle bulk,tone ,power  Data Reviewed: I have personally reviewed following labs and imaging studies  CBC: Recent Labs  Lab 01/29/18 1238 01/30/18 0426  WBC 9.7 6.6  NEUTROABS  --  4.2  HGB 18.6* 16.5  HCT 60.1* 52.1*  MCV 96.0 93.2  PLT 67* 54*   Basic Metabolic Panel: Recent Labs  Lab 01/29/18 1238 01/30/18 0426  NA 150* 148*  K 4.3 4.0  CL 114* 110  CO2 25 24  GLUCOSE 164* 134*  BUN 35* 34*  CREATININE 2.13* 2.01*  CALCIUM 10.0 9.2   GFR: CrCl cannot be calculated (Unknown ideal weight.). Liver Function Tests: Recent Labs  Lab 01/29/18 1238  AST 21  ALT 16  ALKPHOS 53  BILITOT 1.9*  PROT 7.2  ALBUMIN 3.6   Recent Labs  Lab 01/29/18 1238  LIPASE 21   No results for input(s): AMMONIA in the last 168 hours. Coagulation Profile: No results for input(s): INR, PROTIME in the last 168 hours. Cardiac Enzymes: Recent Labs  Lab 01/29/18 2009 01/30/18 0426  TROPONINI 0.35* 0.31*   BNP (last 3 results) No results for input(s): PROBNP in the last 8760 hours. HbA1C: Recent Labs    01/30/18 0426  HGBA1C 6.7*   CBG: Recent Labs  Lab 01/29/18 1753 01/29/18 2124 01/30/18 0612 01/30/18 1150  GLUCAP 149* 142* 116* 128*   Lipid Profile: No results for input(s): CHOL, HDL, LDLCALC, TRIG, CHOLHDL, LDLDIRECT in the last 72 hours. Thyroid Function Tests: No results for input(s): TSH, T4TOTAL, FREET4, T3FREE, THYROIDAB in the last 72 hours. Anemia Panel: No results for input(s): VITAMINB12, FOLATE, FERRITIN, TIBC, IRON, RETICCTPCT in the last 72 hours. Sepsis Labs: Recent Labs  Lab 01/29/18 1210 01/29/18 2010    LATICACIDVEN 2.98* 2.1*    No results found for this or any previous visit (from the past 240 hour(s)).    Radiology Studies: Dg Chest 2 View  Result Date: 01/29/2018 CLINICAL DATA:  URI.  Increasing weakness and shortness of breath. EXAM: CHEST - 2 VIEW COMPARISON:  03/11/2017. FINDINGS: Mediastinum and hilar structures normal. Stable cardiomegaly. Mild bilateral interstitial prominence and bilateral pleural effusions, left side greater right. Findings suggest CHF. Pneumonitis could not be excluded. Left base. Left base infiltrate/pneumonia can not be excluded. No pneumothorax. No acute bony abnormality. IMPRESSION: 1. Cardiomegaly with bilateral pulmonary interstitial prominence and bilateral pleural effusions, left side greater right. CHF could present in this fashion. 2. Left base atelectasis with elevation left hemidiaphragm. Left base infiltrate/pneumonia can not be excluded. Electronically Signed   By: Marcello Moores  Register   On: 01/29/2018 12:56   Ct Head Wo Contrast  Result Date: 01/29/2018 CLINICAL DATA:  84 y/o  M; aggressive worsening of mental status. EXAM: CT HEAD WITHOUT CONTRAST TECHNIQUE: Contiguous axial images were obtained from the base of the skull through the vertex without intravenous contrast. COMPARISON:  11/06/2016 CT head FINDINGS: Brain: No evidence of acute infarction, hemorrhage, hydrocephalus, extra-axial collection or mass lesion/mass effect. Stable nonspecific white matter  hypodensities are compatible with chronic microvascular ischemic changes. Stable volume loss of the brain. Vascular: Calcific atherosclerosis of carotid siphons. No hyperdense vessel identified. Skull: Normal. Negative for fracture or focal lesion. Sinuses/Orbits: No acute finding. Other: None. IMPRESSION: 1. No acute intracranial abnormality identified. 2. Stable chronic microvascular ischemic changes and volume loss of the brain. Electronically Signed   By: Kristine Garbe M.D.   On:  01/29/2018 19:11     Scheduled Meds: . ALPRAZolam  0.25 mg Oral BID  . docusate sodium  100 mg Oral Daily  . fesoterodine  8 mg Oral Daily  . finasteride  5 mg Oral QHS  . folic acid  1 mg Oral Daily  . glipiZIDE  5 mg Oral Daily  . insulin aspart  0-9 Units Subcutaneous TID WC  . ipratropium-albuterol  3 mL Nebulization Q6H  . metoprolol tartrate  25 mg Oral BID  . mirtazapine  15 mg Oral QHS  . tamsulosin  0.4 mg Oral BID   Continuous Infusions: . lactated ringers 88 mL/hr at 01/30/18 0816     LOS: 1 day   Time spent: More than 50% of that time was spent in counseling and/or coordination of care.  Antonieta Pert, MD [facility] Pager [phone]  If 7PM-7AM, please contact night-coverage [email or web address] Password [email or web address] 01/30/2018, 11:56 AM

## 2018-01-30 NOTE — Progress Notes (Addendum)
SLP Cancellation Note  Patient Details Name: Aaron Stephenson MRN: [id number] DOB: 1924/09/19   Cancelled treatment:       Reason Eval/Treat Not Completed: Fatigue/lethargy limiting ability to participate; daughter stated he is having swallowing deficits related to dementia (I.e.: poor PO intake, oral holding, etc.); ST will continue efforts.   Elvina Sidle, M.S., CCC-SLP 01/30/2018, 3:56 PM

## 2018-01-30 NOTE — Clinical Social Work Note (Signed)
Clinical Social Work Assessment  Patient Details  Name: Aaron Stephenson MRN: [id number] Date of Birth: [birth year]  Date of referral:  01/30/18               Reason for consult:  Facility Placement                Permission sought to share information with:  Facility Sport and exercise psychologist, Family Supports Permission granted to share information::  Yes, Verbal Permission Granted  Name::     Carollee Herter   Agency::  [facility]NF[facility]  Relationship::  daughter  Contact Information:  [phone]  Housing/Transportation Living arrangements for the past 2 months:  [place] of Information:  Adult Children Patient Interpreter Needed:  None Criminal Activity/Legal Involvement Pertinent to Current Situation/Hospitalization:  No - Comment as needed Significant Relationships:  Adult Children Lives with:  Facility Resident Do you feel safe going back to the place where you live?    Need for family participation in patient care:  Yes (Comment)  Care giving concerns:  [name] spoke with the patient's daughter Webb Silversmith, and she confirmed patient is from [place]. She expressed concerned about his decline in health.   Social Worker assessment / plan:  [name] spoke with patient's daughter about returning to [place] once the patient is medically stable. She states the family has spoken with the MD regarding the possibility of palliative care following at SNF if needed. [name] will continue to follow.    Employment status:  Other (Comment) Insurance information:  Medicare PT Recommendations:  Not assessed at this time Information / Referral to community resources:  [place]  Patient/Family's Response to care:  Currently family's preference is for the patient to return to [facility].   Patient/Family's Understanding of and Emotional Response to Diagnosis, Current Treatment, and Prognosis: Patient's family expressed understanding of [name] role and discharge process as well as medical  condition. No questions/concerns about plan or treatment.    Emotional Assessment Appearance:  Appears stated age Attitude/Demeanor/Rapport:  Unable to Assess Affect (typically observed):  Unable to Assess Orientation:  Oriented to Self Alcohol / Substance use:  Not Applicable Psych involvement (Current and /or in the community):  No (Comment)  Discharge Needs  Concerns to be addressed:  Care Coordination Readmission within the last 30 days:  No Current discharge risk:    Barriers to Discharge:  Continued Medical Work up   [employer], [place] 01/30/2018, 5:45 PM

## 2018-01-31 ENCOUNTER — Inpatient Hospital Stay (HOSPITAL_COMMUNITY): Payer: Medicare Other

## 2018-01-31 DIAGNOSIS — Z7189 Other specified counseling: Secondary | ICD-10-CM

## 2018-01-31 DIAGNOSIS — F0390 Unspecified dementia without behavioral disturbance: Secondary | ICD-10-CM

## 2018-01-31 DIAGNOSIS — I370 Nonrheumatic pulmonary valve stenosis: Secondary | ICD-10-CM

## 2018-01-31 DIAGNOSIS — Z515 Encounter for palliative care: Secondary | ICD-10-CM

## 2018-01-31 DIAGNOSIS — I340 Nonrheumatic mitral (valve) insufficiency: Secondary | ICD-10-CM

## 2018-01-31 DIAGNOSIS — R627 Adult failure to thrive: Secondary | ICD-10-CM

## 2018-01-31 LAB — BASIC METABOLIC PANEL
ANION GAP: 8 (ref 5–15)
BUN: 32 mg/dL — ABNORMAL HIGH (ref 8–23)
CO2: 28 mmol/L (ref 22–32)
Calcium: 9.3 mg/dL (ref 8.9–10.3)
Chloride: 109 mmol/L (ref 98–111)
Creatinine, Ser: 1.87 mg/dL — ABNORMAL HIGH (ref 0.61–1.24)
GFR calc non Af Amer: 31 mL/min — ABNORMAL LOW (ref >60)
GFR, EST AFRICAN AMERICAN: 36 mL/min — AB (ref >60)
Glucose, Bld: 112 mg/dL — ABNORMAL HIGH (ref 70–99)
Potassium: 4 mmol/L (ref 3.5–5.1)
SODIUM: 145 mmol/L (ref 135–145)

## 2018-01-31 LAB — GLUCOSE, CAPILLARY
Glucose-Capillary: 103 mg/dL — ABNORMAL HIGH (ref 70–99)
Glucose-Capillary: 99 mg/dL (ref 70–99)
Glucose-Capillary: 109 mg/dL — ABNORMAL HIGH (ref 70–99)
Glucose-Capillary: 95 mg/dL (ref 70–99)

## 2018-01-31 LAB — CBC WITH DIFFERENTIAL/PLATELET
Abs Immature Granulocytes: 0.02 10*3/uL (ref 0.00–0.07)
Basophils Absolute: 0.1 10*3/uL (ref 0.0–0.1)
Basophils Relative: 1 %
Eosinophils Absolute: 0.3 10*3/uL (ref 0.0–0.5)
Eosinophils Relative: 4 %
HCT: 51.1 % (ref 39.0–52.0)
Hemoglobin: 16.5 g/dL (ref 13.0–17.0)
Immature Granulocytes: 0 %
Lymphocytes Relative: 25 %
Lymphs Abs: 1.6 10*3/uL (ref 0.7–4.0)
MCH: 30.3 pg (ref 26.0–34.0)
MCHC: 32.3 g/dL (ref 30.0–36.0)
MCV: 93.8 fL (ref 80.0–100.0)
Monocytes Absolute: 0.5 10*3/uL (ref 0.1–1.0)
Monocytes Relative: 8 %
NEUTROS PCT: 62 %
Neutro Abs: 3.8 10*3/uL (ref 1.7–7.7)
Platelets: 58 10*3/uL — ABNORMAL LOW (ref 150–400)
RBC: 5.45 MIL/uL (ref 4.22–5.81)
RDW: 14.1 % (ref 11.5–15.5)
WBC: 6.3 10*3/uL (ref 4.0–10.5)
nRBC: 0 % (ref 0.0–0.2)

## 2018-01-31 LAB — MRSA PCR SCREENING: MRSA by PCR: NEGATIVE

## 2018-01-31 LAB — ECHOCARDIOGRAM COMPLETE

## 2018-01-31 MED ORDER — WHITE PETROLATUM EX OINT
TOPICAL_OINTMENT | CUTANEOUS | Status: AC
  Administered 2018-01-31: 07:00:00
  Filled 2018-01-31: qty 28.35

## 2018-01-31 NOTE — Plan of Care (Signed)
Care plans reviewed and will continue to monitor.  

## 2018-01-31 NOTE — Evaluation (Signed)
Clinical/Bedside Swallow Evaluation Patient Details  Name: Aaron Stephenson MRN: [id number] Date of Birth: 1924/08/24  Today's Date: 01/31/2018 Time: SLP Start Time (ACUTE ONLY): 1109 SLP Stop Time (ACUTE ONLY): 1132 SLP Time Calculation (min) (ACUTE ONLY): 23 min  Past Medical History:  Past Medical History:  Diagnosis Date  . BPH (benign prostatic hypertrophy) 09/21/2014  . Cellulitis in diabetic foot ([place]) 08/11/2016  . Chronic venous insufficiency   . CKD (chronic kidney disease)   . Dementia ([place])   . DM (diabetes mellitus), type 2 ([place])   . Enterococcus UTI   . Esophagitis   . GERD (gastroesophageal reflux disease)   . Hiatal hernia   . HTN (hypertension)   . Hyperlipidemia   . Kidney disease, chronic, stage III (GFR 30-59 ml/min) (H[facility]) 09/21/2014  . Migraine   . Myeloproliferative disease ([place])   . NSTEMI (non-ST elevated myocardial infarction) ([place]) 09/19/2014  . OA (osteoarthritis)   . Physical deconditioning   . Polycythemia vera ([place]) 02/20/2011   JAK 2 mutation +   . Polycythemia vera(238.4) 02/20/2011  . Protein calorie malnutrition ([place])   . Sepsis ([place]) 08/11/2016  . Spinal stenosis, lumbar   . Thrombocytopenia ([place])   . Toxic encephalopathy   . Type 2 diabetes mellitus with renal manifestations ([place]) 09/19/2014  . Vertigo    Past Surgical History:  Past Surgical History:  Procedure Laterality Date  . LOWER LEG SOFT TISSUE TUMOR EXCISION    . TOTAL HIP ARTHROPLASTY  1993   right   HPI:  Pt is a 84 y.o. male with medical history significant of BPH, history of diabetic foot cellulitis, chronic venous insufficiency, chronic kidney disease, dementia, type 2 diabetes, history of enterococcus UTI, GERD/hiatal hernia/history of esophagitis, hypertension, hyperlipidemia, migraine headaches, CAD, history of NSTEMI, osteoarthritis, physical deconditioning, polycythemia vera due to JAK2 mutation, protein calorie malnutrition, history of unspecified sepsis, spinal lumbar  stenosis, thrombocytopenia, vertigo who was brought from the nursing home facility reviewed to progressively worse decline in mentation, weakness and decreased oral intake for the past 2 to 3 weeks.  CXR 12/17: "Left base atelectasis with elevation left hemidiaphragm. Left base infiltrate/pneumonia can not be excluded."  CT 12/17: no acute findings   Assessment / Plan / Recommendation Clinical Impression  Pt presents with mild risk of aspiration in setting of advanced dementia.  Goals of care are for comfort. Pt exhibited limited response to initial trial of ice chip.  Pharyngeal swallow was not palpated on first trial, but swallow reflex noted on subsequent trials. Pt tolerated thin liquid and puree with no overt s/s of aspiration and exhibited adequate oral transit.  With regular solid, pt exhibited oral response, but no mastication and SLP removed bolus from oral cavity.  With soft solid (fruit cup), mastication noted and pt appeared to fully clear oral cavity after prolonged mastication, but visualization was incomplete 2/2 reduced mandibular excursion.  Daughter reports some oral holding.  Recommend puree meal tray for ease of feeding, however, diet may be liberalized for comfort.  Reviewed aspiration precautions and feeding strategies with daughter at bedside.Marland Kitchen SLP Visit Diagnosis: Feeding difficulties (R63.3)    Aspiration Risk  Mild aspiration risk    Diet Recommendation Dysphagia 1 (Puree);Thin liquid   Liquid Administration via: Cup;Straw  Medication Administration: (no specific precautions)  Supervision: (1:1 assistance with meals)  Compensations:   Minimize environmental distractions;  Slow rate;  Small sips/bites;  Follow solids with liquid  Postural Changes: Seated upright at 90 degrees   Feeding strategies:  Compensatory strategies as noted above  Reintroduction of empty spoon  Circumlaryngeal massage  Hand-over-hand feeding   Other  Recommendations Oral Care  Recommendations: Oral care BID   Follow up Recommendations        Frequency and Duration min 1 x/week  1 week        Swallow Study   General HPI: Pt is a 84 y.o. male with medical history significant of BPH, history of diabetic foot cellulitis, chronic venous insufficiency, chronic kidney disease, dementia, type 2 diabetes, history of enterococcus UTI, GERD/hiatal hernia/history of esophagitis, hypertension, hyperlipidemia, migraine headaches, CAD, history of NSTEMI, osteoarthritis, physical deconditioning, polycythemia vera due to JAK2 mutation, protein calorie malnutrition, history of unspecified sepsis, spinal lumbar stenosis, thrombocytopenia, vertigo who was brought from the nursing home facility reviewed to progressively worse decline in mentation, weakness and decreased oral intake for the past 2 to 3 weeks.  CXR 12/17: "Left base atelectasis with elevation left hemidiaphragm. Left base infiltrate/pneumonia can not be excluded."  CT 12/17: no acute findings Type of Study: Bedside Swallow Evaluation Diet Prior to this Study: NPO History of Recent Intubation: No Behavior/Cognition: Requires cueing;Lethargic/Drowsy Oral Cavity Assessment: Within Functional Limits Oral Cavity - Dentition: Adequate natural dentition Self-Feeding Abilities: Total assist Patient Positioning: Upright in bed Baseline Vocal Quality: Normal Volitional Cough: Cognitively unable to elicit Volitional Swallow: Unable to elicit    Oral/Motor/Sensory Function Overall Oral Motor/Sensory Function: (Unable to assess)   Ice Chips Ice chips: Impaired Presentation: Spoon Oral Phase Impairments: Reduced lingual movement/coordination;Poor awareness of bolus   Thin Liquid Thin Liquid: Impaired Presentation: Cup;Straw Pharyngeal  Phase Impairments: Decreased hyoid-laryngeal movement;Suspected delayed Swallow;Multiple swallows    Nectar Thick Nectar Thick Liquid: Not tested   Honey Thick Honey Thick Liquid: Not tested    Puree Puree: Impaired Oral Phase Functional Implications: Oral residue Pharyngeal Phase Impairments: Multiple swallows;Decreased hyoid-laryngeal movement;Suspected delayed Swallow   Solid     Solid: Impaired Presentation: (SLP fed) Oral Phase Impairments: Poor awareness of bolus;Impaired mastication;Reduced lingual movement/coordination Oral Phase Functional Implications: Prolonged oral transit;Impaired mastication;Oral holding Pharyngeal Phase Impairments: Multiple swallows      Celedonio Savage, MA, [place] Office: [phone]; Pager (12/19): [phone] 01/31/2018,12:31 PM

## 2018-01-31 NOTE — Progress Notes (Addendum)
[facility])  [facility] will have a bed to offer Aaron Stephenson on 12/20.  Updated daughter [name], met with her, explained services, philosophy and offered support.  [name] completed necessary registration paperwork.  Dr. Orpah Melter with [facility] will be the attending.  Please send completed DNR with patient.  Pt can be transported 12/20 in the am, before noon if possible.  Please fax discharge summary to: [phone]  Nursing staff:  Please call report to [phone].  You may d/c any IVs.   Thank you for this referral, Venia Carbon BSN, [place] (listed in [place]) [phone]

## 2018-01-31 NOTE — Progress Notes (Signed)
PROGRESS NOTE    Aaron Stephenson  IOE:[id number] DOB: March 04, 1924 DOA: 01/29/2018 PCP: System, Pcp Not In   Brief Narrative: 84 year old male with history of diabetic foot cellulitis, chronic venous insufficiency, CKD, dementia type 2 diabetes, history of enterococcus UTI, GERD/hiatal hernia/history of esophagitis, hypertension, hyperlipidemia, migraine headache, CAD, physical deconditioning, polycythemia vera due to Jak 2 mutation, protein calorie malnutrition, history of unspecified sepsis, spinal lumbar stenosis, thrombocytopenia, vertigo brought from the nursing facility due to progressively worsening decline in his mental status and with progressive weakness decreased oral intake for the past 2 to 3 weeks. In the ER patient was unable to provide history, blood pressure is stable saturating 90% on room air, labs showed hyponatremia was given IV fluid boluses.  Also had troponin of 0.5, lactic acid of 2.9 creatinine 2.13 baseline around 1.6-1.7.  Assessment & Plan:   Hypernatremia: Suspecting from decreased oral intake and hypovolemia in the setting of dementia.  Sodium improved on IV fluids.  Cut down the fluids. Given his dementia likely has high chances of recurrence.  Patient never wished to have feeding tube placed, daughter she wanted to pursue  palliative care.  Type 2 diabetes mellitus with renal manifestations: Sugar is stable continue sliding scale. Holding OHA  Episodes of coughing while drinking per daughter in hospital.  Speech has seen the patient.  Continue diet as per speech.    Hyperlipidemia: On statin at home  BPH/bladder dysfunction-p needs on Flomax and Toviaz at home - will resume today.Having urine retention issues cont bladder/scan and prn straight cath. If recurrent issues may need Foley catheter placement.  Mild AKI on CKD stage III: Baseline creatinine 1.6-1.7.  Creatinine improved to 1.8 with IV fluids.  Continue gentle IV fluids.  Patient POA does not want to  have any further labs drawn.   Elevated troponin.  Troponin on admission 0.5 down trended to 0.35 and 0.31, Suspect demand ischemia from dehydration, chronic CHF renal failure.  Patient has no chest pain, not in distress.  Last echo EF 50 to 55% in August 2018.  Echocardiogram  report pending.  POA  does not want to pursue further aggressive care  Chronic systolic HWE:XHBZ echo EF 50 to 55% in August 2018.Stable, holding diuretics.  Patient getting IV fluids will cut down the rate to 50 ml/hr  GERD : Protonix.  Thrombocytopenia , chronic: plt at 54k,  Monitor. Caution with anticoagulation. Polycythemia vera : Hemoglobin 16.5 g.  Stable. Per pharmacy Eliquis has been discontinued prior to admission by the cardiologist as per patient's daughter  Dementia patient has been progressively declining since Thanksgiving.  Palliative care consulted as per patient's POA.  Seen by palliative care and they have felt patient is appropriate for hospice care and has been referred.  Patient is being transferred to [address].Patient has been Remeron at bedtime and Xanax twice daily, resume slowly.  DVT prophylaxis: Add SCD.  Avoid heparin/Lovenox due to his thrombocytopenia with plt in 54k Code Status: DNR Family Communication: Daughter POA at bedside.  POA reported that she has been trying to get palliative care see him at the nursing home without success. She requested palliative care consultation and WAS seen by palliative today. Disposition Plan: Hospice/palliative  Consultants: Palliative care  Procedures: none  Antimicrobials:none  Subjective: appears somewhat MORE alert and awake today. No new complaints.  Daughter at the bedside.  Objective: Vitals:   01/31/18 0055 01/31/18 0518 01/31/18 0809 01/31/18 1132  BP:  117/67  133/84  Pulse:  61  73  Resp:  (!) 25  (!) 23  Temp:  (!) 97.4 F (36.3 C)    TempSrc:  Oral    SpO2: 98% 99% 98% 98%    Intake/Output Summary (Last 24 hours) at 01/31/2018  1414 Last data filed at 01/31/2018 0600 Gross per 24 hour  Intake 513.44 ml  Output 800 ml  Net -286.56 ml   There were no vitals filed for this visit. Weight change:   There is no height or weight on file to calculate BMI.  Examination:  General exam: Calm, comfortable,weak and lethargic.   HEENT:Oral mucosa moist, Ear/Nose WNL grossly Respiratory system: Bilateral equal air entry, no crackles and wheezing Cardiovascular system: S1 & S2 heard, No JVD/murmurs.No pedal edema. Gastrointestinal system: Abdomen soft, nontender non-distended, BS +. Nervous System: Lethargic, oriented x1-2.Able to move UE and LE Extremities: No edema,distal peripheral pulses palpable. Skin: No rashes,no icterus. MSK: Normal muscle bulk,tone ,power  Data Reviewed: I have personally reviewed following labs and imaging studies  CBC: Recent Labs  Lab 01/29/18 1238 01/30/18 0426 01/31/18 0235  WBC 9.7 6.6 6.3  NEUTROABS  --  4.2 3.8  HGB 18.6* 16.5 16.5  HCT 60.1* 52.1* 51.1  MCV 96.0 93.2 93.8  PLT 67* 54* 58*   Basic Metabolic Panel: Recent Labs  Lab 01/29/18 1238 01/30/18 0426 01/31/18 0235  NA 150* 148* 145  K 4.3 4.0 4.0  CL 114* 110 109  CO2 25 24 28   GLUCOSE 164* 134* 112*  BUN 35* 34* 32*  CREATININE 2.13* 2.01* 1.87*  CALCIUM 10.0 9.2 9.3   GFR: CrCl cannot be calculated (Unknown ideal weight.). Liver Function Tests: Recent Labs  Lab 01/29/18 1238  AST 21  ALT 16  ALKPHOS 53  BILITOT 1.9*  PROT 7.2  ALBUMIN 3.6   Recent Labs  Lab 01/29/18 1238  LIPASE 21   No results for input(s): AMMONIA in the last 168 hours. Coagulation Profile: No results for input(s): INR, PROTIME in the last 168 hours. Cardiac Enzymes: Recent Labs  Lab 01/29/18 2009 01/30/18 0426  TROPONINI 0.35* 0.31*   BNP (last 3 results) No results for input(s): PROBNP in the last 8760 hours. HbA1C: Recent Labs    01/30/18 0426  HGBA1C 6.7*   CBG: Recent Labs  Lab 01/30/18 1150  01/30/18 1727 01/30/18 2051 01/31/18 0614 01/31/18 1131  GLUCAP 128* 91 112* 103* 99   Lipid Profile: No results for input(s): CHOL, HDL, LDLCALC, TRIG, CHOLHDL, LDLDIRECT in the last 72 hours. Thyroid Function Tests: No results for input(s): TSH, T4TOTAL, FREET4, T3FREE, THYROIDAB in the last 72 hours. Anemia Panel: No results for input(s): VITAMINB12, FOLATE, FERRITIN, TIBC, IRON, RETICCTPCT in the last 72 hours. Sepsis Labs: Recent Labs  Lab 01/29/18 1210 01/29/18 2010  LATICACIDVEN 2.98* 2.1*    Recent Results (from the past 240 hour(s))  MRSA PCR Screening     Status: None   Collection Time: 01/30/18 10:24 PM  Result Value Ref Range Status   MRSA by PCR NEGATIVE NEGATIVE Final    Comment:        The GeneXpert MRSA Assay (FDA approved for NASAL specimens only), is one component of a comprehensive MRSA colonization surveillance program. It is not intended to diagnose MRSA infection nor to guide or monitor treatment for MRSA infections. Performed at [facility], [place] [address] [place], [place] [address]       Radiology Studies: Ct Head Wo Contrast  Result Date: 01/29/2018 CLINICAL DATA:  84 y/o  M; aggressive worsening of mental status. EXAM: CT HEAD WITHOUT CONTRAST TECHNIQUE: Contiguous axial images were obtained from the base of the skull through the vertex without intravenous contrast. COMPARISON:  11/06/2016 CT head FINDINGS: Brain: No evidence of acute infarction, hemorrhage, hydrocephalus, extra-axial collection or mass lesion/mass effect. Stable nonspecific white matter hypodensities are compatible with chronic microvascular ischemic changes. Stable volume loss of the brain. Vascular: Calcific atherosclerosis of carotid siphons. No hyperdense vessel identified. Skull: Normal. Negative for fracture or focal lesion. Sinuses/Orbits: No acute finding. Other: None. IMPRESSION: 1. No acute intracranial abnormality identified. 2. Stable chronic microvascular  ischemic changes and volume loss of the brain. Electronically Signed   By: Kristine Garbe M.D.   On: 01/29/2018 19:11    Scheduled Meds:  Continuous Infusions: . lactated ringers 75 mL/hr (01/31/18 1329)     LOS: 2 days   Time spent: More than 50% of that time was spent in counseling and/or coordination of care.  Antonieta Pert, MD [facility] Pager [phone]  If 7PM-7AM, please contact night-coverage [email or web address] Password [email or web address] 01/31/2018, 2:14 PM

## 2018-01-31 NOTE — Progress Notes (Signed)
Spoke with Bevely Palmer regarding patient's referral for [facility]. She states  currently, they do not have bed available but she will follow up on the referral and than advise [name].  Thurmond Butts, [place] Social Worker [phone]

## 2018-01-31 NOTE — Progress Notes (Signed)
[facility])  Received request for referral to [facility] from [place].  Spoke with daughter [name], she is familiar with [facility] services as her mother passed at [facility] as well.  As of today, we do not have a bed to offer.  I advised her that we will address this daily and communicate this to her and with the LCSW.  Thank you for this referral.  Venia Carbon BSN, RN [facility] Liaison (listed in [place]) [phone]

## 2018-01-31 NOTE — Consult Note (Signed)
Consultation Note Date: 01/31/2018   Patient Name: Aaron Stephenson  DOB: 18-Aug-1924  MRN: [id number]  Age / Sex: 84 y.o., male  PCP: System, Pcp Not In Referring Physician: Antonieta Pert, MD  Reason for Consultation: Establishing goals of care and Hospice Evaluation  HPI/Patient Profile: 84 y.o. male  with past medical history of chronic venous insufficiency, chronic systolic CHF, CKD, dementia, T2DM, GERD, HTN, HLD, CAD, polycythemia vera, and malnutrition admitted on 01/29/2018 with AMS, weakness, and poor PO intake. Family reports decreased oral intake since Thanksgiving. Patient found to have hypernatremia d/t poor PO intake in setting of progressive dementia. He has also been having episodes of coughing while drinking. SLP eval today limited d/t patient's lethargy. Patient has mild risk of aspiration. Some food removed from mouth as patient did not respond with mastication when food placed in oral cavity. PMT consulted for [facility].  Clinical Assessment and Goals of Care: I have reviewed medical records including EPIC notes, labs and imaging, received report from Dr. Lupita Stephenson, assessed the patient and then met with patient's daughter, Aaron Stephenson, to discuss diagnosis prognosis, GOC, EOL wishes, disposition and options.  I introduced Palliative Medicine as specialized medical care for people living with serious illness. It focuses on providing relief from the symptoms and stress of a serious illness. The goal is to improve quality of life for both the patient and the family.  We discussed a brief life review of the patient. She shares about patient living in ALF at [facility] from July 2018 - March 2019 and then [facility] memory care unit March 2019 until now.  As far as functional and nutritional status, Aaron Stephenson tells me patient was bed bound and occasionally assisted into wheelchair. She tells me patient spends most of his time asleep. She tells me that patient  ate small amount of food at Thanksgiving - a few tbsps. Since that time, intake has been decreasing and most recently has been refusing all PO intake or too drowsy to eat.   We discussed his current illness and what it means in the larger context of his on-going co-morbidities.  Natural disease trajectory and expectations at EOL were discussed. Discussed progressive nature of dementia and that patient appears to be at end stages - not eating, not interactive, spending most of his time sleeping, some aspiration. Daughter tells me she feels patient is approaching end of life and has watched him decline over previous months.   I attempted to elicit values and goals of care important to the patient. Aaron Stephenson tells me patient would want [place] to elevate comfort, quality of life, and dignity at the end of his life.   The difference between aggressive medical intervention and comfort care was considered in light of the patient's goals of care. Aaron Stephenson would like to avoid aggressive medical interventions at this time and would like to focus on his comfort. She agrees to comfort care; however, she would like to continue his IV fluids for now until he is discharged. She understands this will not be continued at hospice facility.   Advance directives, concepts specific to code status, artifical feeding and hydration, and rehospitalization were considered and discussed. Confirmed DNR status. Patient would never want artificial feeding.   Hospice and Palliative Care services outpatient were explained and offered. Family is interested in hospice facility - specifically [name].   Questions and concerns were addressed.  Hard Choices booklet left for review - encouraged section on dementia. The family was encouraged to call with questions  or concerns.   Primary Decision Maker NEXT OF KIN - 2 daughters making decisions jointly  SUMMARY OF RECOMMENDATIONS   - Transition to comfort care - continue IV fluids per daughters  wishes until patient goes to [name] - spoke with [name] for transfer to [name] - daughter requests transfer to [name] - will order transfer  Code Status/Advance Care Planning:  DNR   Symptom Management:   Currently no symptom management needs - if needs develop, would avoid morphine in setting of kidney injury - dilaudid or fentanyl  Palliative Prophylaxis:   Aspiration, Bowel Regimen, Delirium Protocol, Frequent Pain Assessment and Turn Reposition  Additional Recommendations (Limitations, Scope, Preferences):  Full Comfort Care  Psycho-social/Spiritual:   Desire for further Chaplaincy support:no  Additional Recommendations: Education on Hospice  Prognosis:   < 2 weeks d/t no PO intake  Discharge Planning: Hospice facility - family requests [name]     Primary Diagnoses: Present on Admission: . Hypernatremia . Benign prostatic hyperplasia . Chronic systolic CHF (congestive heart failure) ([place]) . GERD (gastroesophageal reflux disease) . Kidney disease, chronic, stage III (GFR 30-59 ml/min) (HCC) . Hyperlipidemia . Polycythemia vera ([place]) . Thrombocytopenia ([place]) . Type 2 diabetes mellitus with renal manifestations ([place]) . Dementia ([place]) . AKI (acute kidney injury) ([place] [facility]) . Elevated troponin   I have reviewed the medical record, interviewed the patient and family, and examined the patient. The following aspects are pertinent.  Past Medical History:  Diagnosis Date  . BPH (benign prostatic hypertrophy) 09/21/2014  . Cellulitis in diabetic foot ([place]) 08/11/2016  . Chronic venous insufficiency   . CKD (chronic kidney disease)   . Dementia ([place])   . DM (diabetes mellitus), type 2 ([place])   . Enterococcus UTI   . Esophagitis   . GERD (gastroesophageal reflux disease)   . Hiatal hernia   . HTN (hypertension)   . Hyperlipidemia   . Kidney disease, chronic, stage III (GFR 30-59 ml/min) (HCC) 09/21/2014  . Migraine   . Myeloproliferative disease ([place])   .  NSTEMI (non-ST elevated myocardial infarction) ([place]) 09/19/2014  . OA (osteoarthritis)   . Physical deconditioning   . Polycythemia vera ([place]) 02/20/2011   JAK 2 mutation +   . Polycythemia vera(238.4) 02/20/2011  . Protein calorie malnutrition ([place])   . Sepsis ([place]) 08/11/2016  . Spinal stenosis, lumbar   . Thrombocytopenia ([place])   . Toxic encephalopathy   . Type 2 diabetes mellitus with renal manifestations ([place]) 09/19/2014  . Vertigo    Social History   Socioeconomic History  . Marital status: Married    Spouse name: Not on file  . Number of children: Not on file  . Years of education: Not on file  . Highest education level: Not on file  Occupational History  . Occupation: retired Geographical information systems officer  . Financial resource strain: Not on file  . Food insecurity:    Worry: Not on file    Inability: Not on file  . Transportation needs:    Medical: Not on file    Non-medical: Not on file  Tobacco Use  . Smoking status: Never Smoker  . Smokeless tobacco: Never Used  Substance and Sexual Activity  . Alcohol use: Yes    Comment: rarely  . Drug use: No  . Sexual activity: Not on file  Lifestyle  . Physical activity:    Days per week: Not on file    Minutes per session: Not on file  . Stress: Not on file  Relationships  . Social connections:    Talks on phone: Not on file    Gets together: Not on file    Attends religious service: Not on file    Active member of club or organization: Not on file    Attends meetings of clubs or organizations: Not on file    Relationship status: Not on file  Other Topics Concern  . Not on file  Social History Narrative   Admitted to [facility] 08/17/16    Married -Dee   Never smoked   Alcohol -rarely   DNR   Family History  Problem Relation Age of Onset  . Diabetes Mother   . Hypertension Mother   . Cancer Father   . Diabetes Brother   . Kidney failure Brother    Scheduled Meds: Continuous Infusions: . lactated  ringers 75 mL/hr at 01/31/18 0600   PRN Meds:.acetaminophen **OR** acetaminophen, ipratropium-albuterol, nitroGLYCERIN, ondansetron **OR** ondansetron (ZOFRAN) IV Allergies  Allergen Reactions  . Penicillins Anaphylaxis, Hives and Swelling    Per MAR  Has patient had a PCN reaction causing immediate rash, facial/tongue/throat swelling, SOB or lightheadedness with hypotension: unknown Has patient had a PCN reaction causing severe rash involving mucus membranes or skin necrosis: unknown Has patient had a PCN reaction that required hospitalization: unknown Has patient had a PCN reaction occurring within the last 10 years: unknown If all of the above answers are "NO", then may proceed with Cephalosporin use. Pts family is unable to answer  . Aspirin Other (See Comments)  . Cardura [Doxazosin]     Per MAR  . Ciprofloxacin Other (See Comments)    Per MAR Joint swelling  . Macrodantin [Nitrofurantoin Macrocrystal] Other (See Comments)    Chest pains  . Other     "lubriderm pain pill?"  . Oxycodone Other (See Comments)    Pt took OxyContin and tramadol together and almost "died"  . Sulfamethoxazole Other (See Comments)    Unknown - doesn't remember reaction  . Tramadol Other (See Comments)    Blank stare, no expression, becoming addictive  . Unisom [Doxylamine Succinate (Sleep)] Other (See Comments)    Hangover the next day  . Xanax [Alprazolam] Other (See Comments)    loopy   Review of Systems  Unable to perform ROS: Dementia    Physical Exam Constitutional:      General: He is sleeping. He is not in acute distress.    Appearance: He is ill-appearing. He is not diaphoretic.  Cardiovascular:     Rate and Rhythm: Normal rate and regular rhythm.  Pulmonary:     Effort: No respiratory distress.     Breath sounds: Normal breath sounds.  Abdominal:     Palpations: Abdomen is soft.  Musculoskeletal:        General: No swelling.  Skin:    General: Skin is warm and dry.    Neurological:     Mental Status: He is lethargic and disoriented.  Psychiatric:        Cognition and Memory: Cognition is impaired. Memory is impaired.     Vital Signs: BP 117/67 (BP Location: Left Arm)   Pulse 61   Temp (!) 97.4 F (36.3 C) (Oral)   Resp (!) 25   SpO2 98%  Pain Scale: 0-10   Pain Score: 0-No pain   SpO2: SpO2: 98 % O2 Device:SpO2: 98 % O2 Flow Rate: .O2 Flow Rate (L/min): 2 L/min  IO: Intake/output summary:   Intake/Output Summary (Last 24  hours) at 01/31/2018 1132 Last data filed at 01/31/2018 0600 Gross per 24 hour  Intake 513.44 ml  Output 800 ml  Net -286.56 ml    LBM: Last BM Date: 01/29/18 Baseline Weight:   Most recent weight:       Palliative Assessment/Data: PPS 20%    Time Total: 70 minutes Greater than 50%  of this time was spent counseling and coordinating care related to the above assessment and plan.  Juel Burrow, DNP, AGNP-C Palliative Medicine Team [phone] Pager: [phone]

## 2018-01-31 NOTE — Progress Notes (Signed)
  Echocardiogram 2D Echocardiogram has been performed.  Cornesha Radziewicz L Androw 01/31/2018, 11:33 AM

## 2018-02-01 MED ORDER — ALPRAZOLAM 0.25 MG PO TABS
0.2500 mg | ORAL_TABLET | Freq: Once | ORAL | Status: AC
  Administered 2018-02-01: 0.25 mg via ORAL
  Filled 2018-02-01: qty 1

## 2018-02-01 NOTE — Progress Notes (Addendum)
DC summary given to transport for [name]. Report called to Colletta Maryland, nurse at [facility]. VSS. Questions answered. Daughter at bedside during time of DC. PIV DC, hemostasis achieved. Foley catheter CDI, maintained for EOL care by [facility]. Will continue to monitor until time of transfer. Emotional support given to daughter. All belongings sent with patient and family.    1430: 0.25mg  PO xanax given. PTAR at bedside. Pt discharged to [facility] for hospice care. Family at bedside at time of transfer.

## 2018-02-01 NOTE — Care Management Important Message (Signed)
Important Message  Patient Details  Name: Aaron Stephenson MRN: [id number] Date of Birth: [birth year]   Medicare Important Message Given:  Yes    Barb Merino [name] 02/01/2018, 3:49 PM

## 2018-02-01 NOTE — Progress Notes (Signed)
Patient will DC to: [name] Anticipated DC date: 02/01/2018 Family notified: Yes, spoke with daughter at bedside.  Transport by: Corey Harold   Per MD patient ready for DC to . RN, patient, patient's family, and facility notified of DC. Discharge Summary and FL2 sent to facility. RN to call report prior to discharge (). DC packet on chart. Ambulance transport requested for patient.   [name] will sign off for now as social work intervention is no longer needed. Please consult [place] again if new needs arise.  Jeet Shough, LCSW-A [place]/Clinical Social Work Department Cell: [phone]

## 2018-02-01 NOTE — Progress Notes (Signed)
Daily Progress Note   Patient Name: Aaron Stephenson       Date: 02/01/2018 DOB: [birth year]  Age: 84 y.o. MRN#: [id number] Attending Physician: Cristal Deer, MD Primary Care Physician: System, Pcp Not In Admit Date: 01/29/2018  Reason for Consultation/Follow-up: Establishing goals of care and Hospice Evaluation  Subjective: Patient resting, does not speak. Daughter, [name], at bedside.  Length of Stay: 3  Current Medications: Scheduled Meds:    Continuous Infusions:   PRN Meds: acetaminophen **OR** acetaminophen, ipratropium-albuterol, nitroGLYCERIN, ondansetron **OR** ondansetron (ZOFRAN) IV  Physical Exam         Constitutional:      General: He is sleeping. He is not in acute distress.    Appearance: He is ill-appearing. He is not diaphoretic.  Cardiovascular:     Rate and Rhythm: Normal rate and regular rhythm.  Pulmonary:     Effort: No respiratory distress.     Breath sounds: Normal breath sounds.  Abdominal:     Palpations: Abdomen is soft.  Musculoskeletal:        General: No swelling.  Skin:    General: Skin is warm and dry.  Neurological:     Mental Status: He is lethargic and disoriented.  Psychiatric:        Cognition and Memory: Cognition is impaired. Memory is impaired.   Vital Signs: BP 129/79 (BP Location: Left Arm)   Pulse 70   Temp 97.8 F (36.6 C) (Oral)   Resp (!) 22   SpO2 99%  SpO2: SpO2: 99 % O2 Device: O2 Device: Nasal Cannula O2 Flow Rate: O2 Flow Rate (L/min): 2 L/min  Intake/output summary:   Intake/Output Summary (Last 24 hours) at 02/01/2018 1103 Last data filed at 02/01/2018 0800 Gross per 24 hour  Intake 1293.32 ml  Output 1 ml  Net 1292.32 ml   LBM: Last BM Date: 02/01/18 Baseline Weight:   Most recent weight:           Palliative Assessment/Data: PPS 20%    Flowsheet Rows     Most Recent Value  Intake Tab  Referral Department  Hospitalist  Unit at Time of Referral  Med/Surg Unit  Palliative Care Primary Diagnosis  Neurology  Date Notified  01/30/18  Palliative Care Type  New Palliative care  Reason for referral  Clarify Goals of Care  Date of Admission  01/29/18  Date first seen by Palliative Care  01/31/18  # of days Palliative referral response time  1 Day(s)  # of days IP prior to Palliative referral  1  Clinical Assessment  Palliative Performance Scale Score  20%  Psychosocial & Spiritual Assessment  Palliative Care Outcomes  Patient/Family meeting held?  Yes  Who was at the meeting?  daughter  Palliative Care Outcomes  Clarified goals of care, Counseled regarding hospice, Provided end of life care assistance, Provided psychosocial or spiritual support, Changed to focus on comfort, Transitioned to hospice      Patient Active Problem List   Diagnosis Date Noted  . Failure to thrive in adult   . Goals of care, counseling/discussion   . Palliative care by specialist   . Comfort measures only status   . Hypernatremia 01/29/2018  .  Thrombocytopenia ([place]) 01/29/2018  . Dementia ([place]) 01/29/2018  . AKI (acute kidney injury) ([place]) 01/29/2018  . Elevated troponin 01/29/2018  . Upper airway cough syndrome 10/27/2016  . Syncope 10/06/2016  . Cellulitis 09/04/2016  . New onset a-fib ([place]) 08/19/2016  . Hypertensive heart disease with congestive heart failure ([place]) 08/19/2016  . Chronic systolic CHF (congestive heart failure) ([place]) 08/19/2016  . Coronary artery disease 08/19/2016  . Pressure injury of skin 08/16/2016  . Salmonella bacteremia 08/16/2016  . Sepsis ([place]) 08/11/2016  . Cellulitis in diabetic foot ([place]) 08/11/2016  . HCAP (healthcare-associated pneumonia) 08/11/2016  . Bilateral knee effusions 05/01/2016  . Primary osteoarthritis of both knees 05/01/2016  . GERD  (gastroesophageal reflux disease) 07/26/2015  . Toxic encephalopathy 07/21/2015  . Kidney disease, chronic, stage III (GFR 30-59 ml/min) (HCC) 09/21/2014  . Abnormal cardiovascular function study 09/21/2014  . Benign prostatic hyperplasia 09/21/2014  . Hyperlipidemia   . Chronic venous insufficiency   . NSTEMI (non-ST elevated myocardial infarction) ([place]) 09/19/2014  . Type 2 diabetes mellitus with renal manifestations ([place]) 09/19/2014  . Polycythemia vera ([place]) 02/20/2011    Palliative Care Assessment & Plan   HPI: 84 y.o. male  with past medical history of chronic venous insufficiency, chronic systolic CHF, CKD, dementia, T2DM, GERD, HTN, HLD, CAD, polycythemia vera, and malnutrition admitted on 01/29/2018 with AMS, weakness, and poor PO intake. Family reports decreased oral intake since Thanksgiving. Patient found to have hypernatremia d/t poor PO intake in setting of progressive dementia. He has also been having episodes of coughing while drinking. SLP eval today limited d/t patient's lethargy. Patient has mild risk of aspiration. Some food removed from mouth as patient did not respond with mastication when food placed in oral cavity. PMT consulted for [place].  Assessment: Follow up with daughter, [name]. Patient is to be transferred to [place] is pleased with this. She read "Hard Choices" book yesterday and expresses appreciation for information.  RN reports concerns that external catheter is causing pain - ordered indwelling catheter for comfort/end of life.   All questions addressed. Emotional support provided.  Recommendations/Plan:  Continue comfort care  Transfer to [facility] today  Place indwelling catheter prior to transfer  Goals of Care and Additional Recommendations:  Limitations on Scope of Treatment: Full Comfort Care  Code Status:  DNR  Prognosis:   < 2 weeks  Discharge Planning:  Hospice facility  Care plan was discussed with daughter  and RN  Thank you for allowing the Palliative Medicine Team to assist in the care of this patient.   Total Time 15 minutes Prolonged Time Billed  no       Greater than 50%  of this time was spent counseling and coordinating care related to the above assessment and plan.  Juel Burrow, DNP, [facility] Palliative Medicine Team Team Phone # [phone]  Pager [phone]

## 2018-02-01 NOTE — Plan of Care (Signed)
  Problem: Education: Goal: Knowledge of General Education information will improve Description Including pain rating scale, medication(s)/side effects and non-pharmacologic comfort measures Outcome: Adequate for Discharge   Problem: Health Behavior/Discharge Planning: Goal: Ability to manage health-related needs will improve Outcome: Adequate for Discharge   Problem: Clinical Measurements: Goal: Ability to maintain clinical measurements within normal limits will improve Outcome: Adequate for Discharge Goal: Will remain free from infection Outcome: Adequate for Discharge Goal: Diagnostic test results will improve Outcome: Adequate for Discharge Goal: Respiratory complications will improve Outcome: Adequate for Discharge Goal: Cardiovascular complication will be avoided Outcome: Adequate for Discharge   Problem: Activity: Goal: Risk for activity intolerance will decrease Outcome: Adequate for Discharge   Problem: Nutrition: Goal: Adequate nutrition will be maintained Outcome: Adequate for Discharge   Problem: Coping: Goal: Level of anxiety will decrease Outcome: Adequate for Discharge   Problem: Elimination: Goal: Will not experience complications related to bowel motility Outcome: Adequate for Discharge, placed foley catheter for end of life care per palliative team. 363ml Amber, clear urine return. Maintain foley for transition to [facility].  Goal: Will not experience complications related to urinary retention Outcome: Adequate for Discharge   Problem: Pain Managment: Goal: General experience of comfort will improve Outcome: Adequate for Discharge   Problem: Safety: Goal: Ability to remain free from injury will improve Outcome: Adequate for Discharge   Problem: Skin Integrity: Goal: Risk for impaired skin integrity will decrease Outcome: Adequate for Discharge

## 2018-02-01 NOTE — Progress Notes (Signed)
Speech Language Pathology Discharge Patient Details Name: Aaron Stephenson MRN: [id number] DOB: 1924/05/18 Today's Date: 02/01/2018 Time: 71    Patient discharged from SLP services secondary to pending DC with hospice. SLP followed up after BSE completed 01/31/18. Family declined further education.  Please see latest therapy progress note for current level of functioning and progress toward goals.    [name] [facility], [place] Speech Language Pathologist [phone]   [name] 02/01/2018, 12:59 PM

## 2018-02-01 NOTE — Consult Note (Signed)
            [facility] CM Primary Care Navigator  02/01/2018  Aaron Stephenson 1924/10/16 [id number]   Attempt to seepatientat the bedsideto identify possible discharge needsbutMD notes states that patient and power of attorney are agreeable for palliative care and transfer to [facility] for further care.     Per chart review, patientwas brought from the nursing home facility with progressively worse decline in mentation, weakness and decreased oral intake for the past few weeks. Power of attorney did not want to pursue any further aggressive management of the elevated troponin. Inpatient social worker note states that patient has been residing at [facility] skilled nursing facility.  Palliative care consulted with recommendation to continue comfort care and transfer to [facility] facility.  Noted no further [facility] care management needs identifiable at this point.  For additional questions please contact:  Edwena Felty A. Shalayah Beagley, BSN, RN-BC [facility] PRIMARY CARE Navigator Cell: [phone]

## 2018-02-01 NOTE — Discharge Summary (Addendum)
Discharge Summary  DAVISON OHMS IFO:[id number] DOB: Dec 08, 1924  PCP: System, Pcp Not In  Admit date: 01/29/2018 Discharge date: 02/01/2018  Time spent: 30 minutes  Recommendations for Outpatient Follow-up:  1. Nursing home for palliative care  Discharge Diagnoses:  Active Hospital Problems   Diagnosis Date Noted  . Hypernatremia 01/29/2018  . Failure to thrive in adult   . Goals of care, counseling/discussion   . Palliative care by specialist   . Comfort measures only status   . Thrombocytopenia ([place]) 01/29/2018  . Dementia ([place]) 01/29/2018  . AKI (acute kidney injury) ([place]) 01/29/2018  . Elevated troponin 01/29/2018  . Chronic systolic CHF (congestive heart failure) ([place]) 08/19/2016  . GERD (gastroesophageal reflux disease) 07/26/2015  . Benign prostatic hyperplasia 09/21/2014  . Kidney disease, chronic, stage III (GFR 30-59 ml/min) (HCC) 09/21/2014  . Hyperlipidemia   . Type 2 diabetes mellitus with renal manifestations ([place]) 09/19/2014  . Polycythemia vera ([place]) 02/20/2011    Resolved Hospital Problems  No resolved problems to display.    Discharge Condition: Stable  Diet recommendation: Tolerated heart healthy diet  Vitals:   02/01/18 0700 02/01/18 0900  BP:    Pulse: 70   Resp: (!) 28 (!) 22  Temp:    SpO2: 99%     History of present illness:  Aaron Stephenson is a 84 y.o. male with medical history significant of BPH, history of diabetic foot cellulitis, chronic venous insufficiency, chronic kidney disease, dementia, type 2 diabetes, history of enterococcus UTI, GERD/hiatal hernia/history of esophagitis, hypertension, hyperlipidemia, migraine headaches, CAD, history of NSTEMI, osteoarthritis, physical deconditioning, polycythemia vera due to JAK2 mutation, protein calorie malnutrition, history of unspecified sepsis, spinal lumbar stenosis, thrombocytopenia, vertigo who was brought from the nursing home facility reviewed to progressively worse decline in  mentation, weakness and decreased oral intake for the past 2 to 3 weeks.        Hospital Course:  Palliative consult was obtained and patient and power of attorney are agreeable for palliative care and transfer [facility] for further care  While in the hospital patient was treated for: 1.  Hyponatremia most likely due to reduced intake 2.  Type 2 diabetes mellitus we continued him on sliding scale 3.  Episode of coughing while drinking speech therapy was obtained 4.  Acute kidney injury on chronic kidney disease stage III patient was at baseline for his creatinine gentle IV fluids was done 5.  Elevated troponin of 0.5 on admission is down to 0.35 and 0.31.  Suspect is from the ischemia from dehydration chronic renal failure.  The power of attorney did not want to pursue any further aggressive management of the elevated troponin 6.  Progressively worsening dementia with recent decline power of attorney wants patient to be seen by palliative care which she has been trying to get while she was in the nursing home.  Palliative consult was obtained and they felt that she was candidate for palliative care.  We will continue comfort care with Xanax and Remeron for agitation 7.  Speech therapy was consulted and they recommend pured diet but patient does not like pured diet   Principal Problem:   Hypernatremia Active Problems:   Polycythemia vera (HCC)   Type 2 diabetes mellitus with renal manifestations (HCC)   Hyperlipidemia   Kidney disease, chronic, stage III (GFR 30-59 ml/min) (HCC)   Benign prostatic hyperplasia   GERD (gastroesophageal reflux disease)   Chronic systolic CHF (congestive heart failure) (HCC)   Thrombocytopenia ([place])  Dementia ([place])   AKI (acute kidney injury) ([place])   Elevated troponin   Failure to thrive in adult   Goals of care, counseling/discussion   Palliative care by specialist   Comfort measures only  status   Procedures:  None  Consultations:  Palliative care    Communication.:  His daughter, power of attorney at bedside was updated on plan  Discharge Exam: BP 129/79 (BP Location: Left Arm)   Pulse 70   Temp 97.8 F (36.6 C) (Oral)   Resp (!) 22   SpO2 99%   General: Calm and comfortable generally weak and lethargic.  Alert oriented x1 Cardiovascular: S1-S2 heard no murmur no pedal edema Respiratory: Bilateral air entry equal no crackles no wheezing no distress Musculoskeletal: He is moving all 4 extremities  Discharge Instructions You were cared for by a hospitalist during your hospital stay. If you have any questions about your discharge medications or the care you received while you were in the hospital after you are discharged, you can call the unit and asked to speak with the hospitalist on call if the hospitalist that took care of you is not available. Once you are discharged, your primary care physician will handle any further medical issues. Please note that NO REFILLS for any discharge medications will be authorized once you are discharged, as it is imperative that you return to your primary care physician (or establish a relationship with a primary care physician if you do not have one) for your aftercare needs so that they can reassess your need for medications and monitor your lab values.  Discharge Instructions    Call MD for:  difficulty breathing, headache or visual disturbances   Complete by:  As directed    Diet - low sodium heart healthy   Complete by:  As directed    Discharge instructions   Complete by:  As directed    To [facility]     Allergies as of 02/01/2018      Reactions   Penicillins Anaphylaxis, Hives, Swelling   Per MAR Has patient had a PCN reaction causing immediate rash, facial/tongue/throat swelling, SOB or lightheadedness with hypotension: unknown Has patient had a PCN reaction causing severe rash involving mucus membranes or skin  necrosis: unknown Has patient had a PCN reaction that required hospitalization: unknown Has patient had a PCN reaction occurring within the last 10 years: unknown If all of the above answers are "NO", then may proceed with Cephalosporin use. Pts family is unable to answer   Aspirin Other (See Comments)   Cardura [doxazosin]    Per MAR   Ciprofloxacin Other (See Comments)   Per MAR Joint swelling   Macrodantin [nitrofurantoin Macrocrystal] Other (See Comments)   Chest pains   Other    "lubriderm pain pill?"   Oxycodone Other (See Comments)   Pt took OxyContin and tramadol together and almost "died"   Sulfamethoxazole Other (See Comments)   Unknown - doesn't remember reaction   Tramadol Other (See Comments)   Blank stare, no expression, becoming addictive   Unisom [doxylamine Succinate (sleep)] Other (See Comments)   Hangover the next day   Xanax [alprazolam] Other (See Comments)   loopy      Medication List    STOP taking these medications   chlorhexidine 0.12 % solution Commonly known as:  PERIDEX   finasteride 5 MG tablet Commonly known as:  PROSCAR   levETIRAcetam 500 MG tablet Commonly known as:  KEPPRA     TAKE  these medications   acetaminophen 650 MG CR tablet Commonly known as:  TYLENOL Take 650 mg by mouth 3 (three) times daily.   ALPRAZolam 0.25 MG tablet Commonly known as:  XANAX Take 0.25 mg by mouth 2 (two) times daily.   atorvastatin 10 MG tablet Commonly known as:  LIPITOR Take 10 mg by mouth at bedtime.   benzonatate 100 MG capsule Commonly known as:  TESSALON Take 1 capsule (100 mg total) by mouth 3 (three) times daily as needed for cough.   cholecalciferol 25 MCG (1000 UT) tablet Commonly known as:  VITAMIN D3 Take 1,000 Units by mouth daily.   Diclofenac Sodium 2 % Soln Apply 1 application topically 2 (two) times daily. To both knees   docusate sodium 100 MG capsule Commonly known as:  COLACE Take 100 mg by mouth daily.    doxycycline 100 MG capsule Commonly known as:  VIBRAMYCIN Take 1 capsule (100 mg total) by mouth 2 (two) times daily.   folic acid 1 MG tablet Commonly known as:  FOLVITE Take 1 mg by mouth daily.   furosemide 40 MG tablet Commonly known as:  LASIX Take 40 mg by mouth daily.   glipiZIDE 5 MG 24 hr tablet Commonly known as:  GLUCOTROL XL Take 5 mg by mouth daily.   ipratropium-albuterol 0.5-2.5 (3) MG/3ML Soln Commonly known as:  DUONEB Take 3 mLs by nebulization every 4 (four) hours as needed (sob).   ketoconazole 2 % cream Commonly known as:  NIZORAL Apply 1 application topically every 14 (fourteen) days. Apply to scalp, leave on with lather for 5 mins then rinse   losartan 25 MG tablet Commonly known as:  COZAAR Take 1 tablet (25 mg total) by mouth daily.   metoprolol tartrate 25 MG tablet Commonly known as:  LOPRESSOR Take 25 mg by mouth 2 (two) times daily.   mirtazapine 15 MG tablet Commonly known as:  REMERON Take 15 mg by mouth at bedtime.   nitroGLYCERIN 0.4 MG SL tablet Commonly known as:  NITROSTAT Place 1 tablet (0.4 mg total) under the tongue every 5 (five) minutes x 3 doses as needed for chest pain.   REMEDY CALAZIME EX Apply 1 application topically 2 (two) times daily. To buttocks   tamsulosin 0.4 MG Caps capsule Commonly known as:  FLOMAX Take 0.4 mg by mouth 2 (two) times daily.   tolterodine 4 MG 24 hr capsule Commonly known as:  DETROL LA Take 4 mg by mouth at bedtime.   vitamin B-12 1000 MCG tablet Commonly known as:  CYANOCOBALAMIN Take 2,000 mcg by mouth daily.      Allergies  Allergen Reactions  . Penicillins Anaphylaxis, Hives and Swelling    Per MAR  Has patient had a PCN reaction causing immediate rash, facial/tongue/throat swelling, SOB or lightheadedness with hypotension: unknown Has patient had a PCN reaction causing severe rash involving mucus membranes or skin necrosis: unknown Has patient had a PCN reaction that required  hospitalization: unknown Has patient had a PCN reaction occurring within the last 10 years: unknown If all of the above answers are "NO", then may proceed with Cephalosporin use. Pts family is unable to answer  . Aspirin Other (See Comments)  . Cardura [Doxazosin]     Per MAR  . Ciprofloxacin Other (See Comments)    Per MAR Joint swelling  . Macrodantin [Nitrofurantoin Macrocrystal] Other (See Comments)    Chest pains  . Other     "lubriderm pain pill?"  . Oxycodone Other (  See Comments)    Pt took OxyContin and tramadol together and almost "died"  . Sulfamethoxazole Other (See Comments)    Unknown - doesn't remember reaction  . Tramadol Other (See Comments)    Blank stare, no expression, becoming addictive  . Unisom [Doxylamine Succinate (Sleep)] Other (See Comments)    Hangover the next day  . Xanax [Alprazolam] Other (See Comments)    loopy      The results of significant diagnostics from this hospitalization (including imaging, microbiology, ancillary and laboratory) are listed below for reference.    Significant Diagnostic Studies: Dg Chest 2 View  Result Date: 01/29/2018 CLINICAL DATA:  URI.  Increasing weakness and shortness of breath. EXAM: CHEST - 2 VIEW COMPARISON:  03/11/2017. FINDINGS: Mediastinum and hilar structures normal. Stable cardiomegaly. Mild bilateral interstitial prominence and bilateral pleural effusions, left side greater right. Findings suggest CHF. Pneumonitis could not be excluded. Left base. Left base infiltrate/pneumonia can not be excluded. No pneumothorax. No acute bony abnormality. IMPRESSION: 1. Cardiomegaly with bilateral pulmonary interstitial prominence and bilateral pleural effusions, left side greater right. CHF could present in this fashion. 2. Left base atelectasis with elevation left hemidiaphragm. Left base infiltrate/pneumonia can not be excluded. Electronically Signed   By: Marcello Moores  Register   On: 01/29/2018 12:56   Ct Head Wo  Contrast  Result Date: 01/29/2018 CLINICAL DATA:  84 y/o  M; aggressive worsening of mental status. EXAM: CT HEAD WITHOUT CONTRAST TECHNIQUE: Contiguous axial images were obtained from the base of the skull through the vertex without intravenous contrast. COMPARISON:  11/06/2016 CT head FINDINGS: Brain: No evidence of acute infarction, hemorrhage, hydrocephalus, extra-axial collection or mass lesion/mass effect. Stable nonspecific white matter hypodensities are compatible with chronic microvascular ischemic changes. Stable volume loss of the brain. Vascular: Calcific atherosclerosis of carotid siphons. No hyperdense vessel identified. Skull: Normal. Negative for fracture or focal lesion. Sinuses/Orbits: No acute finding. Other: None. IMPRESSION: 1. No acute intracranial abnormality identified. 2. Stable chronic microvascular ischemic changes and volume loss of the brain. Electronically Signed   By: Kristine Garbe M.D.   On: 01/29/2018 19:11    Microbiology: Recent Results (from the past 240 hour(s))  MRSA PCR Screening     Status: None   Collection Time: 01/30/18 10:24 PM  Result Value Ref Range Status   MRSA by PCR NEGATIVE NEGATIVE Final    Comment:        The GeneXpert MRSA Assay (FDA approved for NASAL specimens only), is one component of a comprehensive MRSA colonization surveillance program. It is not intended to diagnose MRSA infection nor to guide or monitor treatment for MRSA infections. Performed at [facility], [place] [address] [place], [place] [address]      Labs: Basic Metabolic Panel: Recent Labs  Lab 01/29/18 1238 01/30/18 0426 01/31/18 0235  NA 150* 148* 145  K 4.3 4.0 4.0  CL 114* 110 109  CO2 25 24 28   GLUCOSE 164* 134* 112*  BUN 35* 34* 32*  CREATININE 2.13* 2.01* 1.87*  CALCIUM 10.0 9.2 9.3   Liver Function Tests: Recent Labs  Lab 01/29/18 1238  AST 21  ALT 16  ALKPHOS 53  BILITOT 1.9*  PROT 7.2  ALBUMIN 3.6   Recent Labs  Lab  01/29/18 1238  LIPASE 21   No results for input(s): AMMONIA in the last 168 hours. CBC: Recent Labs  Lab 01/29/18 1238 01/30/18 0426 01/31/18 0235  WBC 9.7 6.6 6.3  NEUTROABS  --  4.2 3.8  HGB 18.6* 16.5 16.5  HCT 60.1* 52.1* 51.1  MCV 96.0 93.2 93.8  PLT 67* 54* 58*   Cardiac Enzymes: Recent Labs  Lab 01/29/18 2009 01/30/18 0426  TROPONINI 0.35* 0.31*   BNP: BNP (last 3 results) No results for input(s): BNP in the last 8760 hours.  ProBNP (last 3 results) No results for input(s): PROBNP in the last 8760 hours.  CBG: Recent Labs  Lab 01/30/18 2051 01/31/18 0614 01/31/18 1131 01/31/18 1602 01/31/18 2133  GLUCAP 112* 103* 99 109* 95       Signed:  Cristal Deer, MD [facility] 02/01/2018, 10:43 AM

## 2018-02-01 NOTE — Clinical Social Work Placement (Signed)
Nurse to call and report to [phone].   CLINICAL SOCIAL WORK PLACEMENT  NOTE  Date:  02/01/2018  Patient Details  Name: Aaron Stephenson MRN: [id number] Date of Birth: 11-17-1924  Clinical Social Work is seeking post-discharge placement for this patient at the [facility] ) level of care (*[name] will initial, date and re-position this form in  chart as items are completed):  Yes   Patient/family provided with [facility] Work Department's list of facilities offering this level of care within the geographic area requested by the patient (or if unable, by the patient's family).  Yes   Patient/family informed of their freedom to choose among providers that offer the needed level of care, that participate in Medicare, Medicaid or managed care program needed by the patient, have an available bed and are willing to accept the patient.  No   Patient/family informed of [place]'s ownership interest in [facility] and [facility], as well as of the fact that they are under no obligation to receive care at these facilities.  PASRR submitted to EDS on       PASRR number received on       Existing PASRR number confirmed on       FL2 transmitted to all facilities in geographic area requested by pt/family on       FL2 transmitted to all facilities within larger geographic area on       Patient informed that his/her managed care company has contracts with or will negotiate with certain facilities, including the following:            Patient/family informed of bed offers received.  Patient chooses bed at       Physician recommends and patient chooses bed at      Patient to be transferred to   on 02/01/18.  Patient to be transferred to facility by [facility]     Patient family notified on 02/01/18 of transfer.  Name of family member notified:  [name], Daughter     PHYSICIAN       Additional Comment:     _______________________________________________ Gelene Mink, [place] 02/01/2018, 11:59 AM

## 2018-02-13 DEATH — deceased

## 2019-03-11 IMAGING — RF DG ESOPHAGUS
6 series · 20 of 21 positions shown · non-contrast
Comparison: Modified barium swallow 08/15/2016

CLINICAL DATA: Recurrent cough.

EXAM:
ESOPHOGRAM/BARIUM SWALLOW
TECHNIQUE: Single contrast examination was performed using  thin barium.
FLUOROSCOPY TIME:  Fluoroscopy Time:  1 minutes 18 seconds
Radiation Exposure Index (if provided by the fluoroscopic device):
7.9 mGy
Number of Acquired Spot Images: 0

[Series 1: cp_standard · 0.51mm/px · 4 of 40 frames shown (1 of 6)]
[frame 7/40]
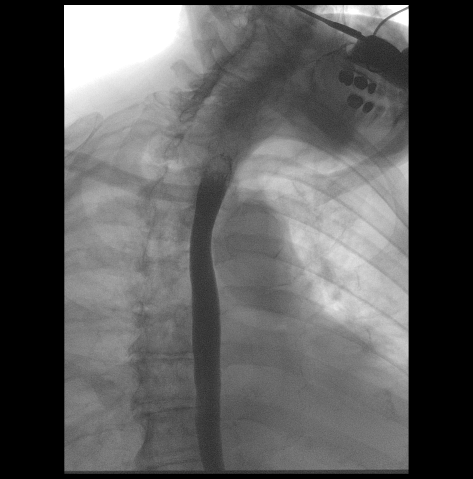
[frame 14/40]
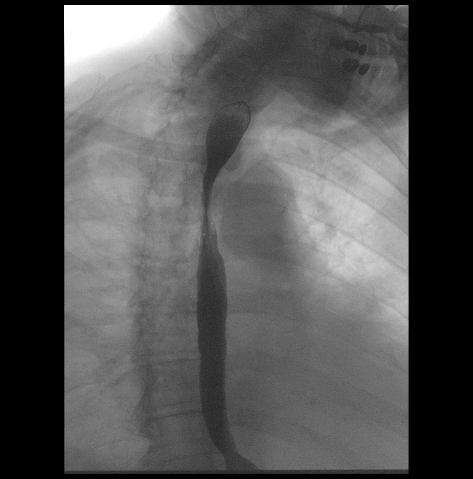
[frame 21/40]
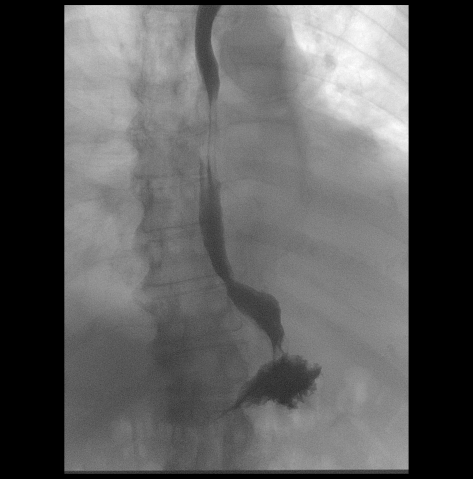
[frame 35/40]
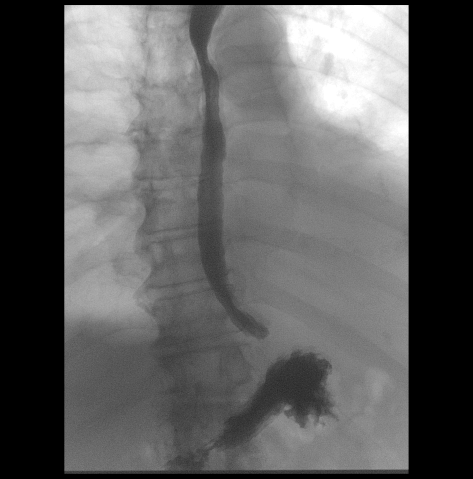

[Series 2: cp_standard · 0.51mm/px · 4 of 22 frames shown (2 of 6)]
[frame 4/22]
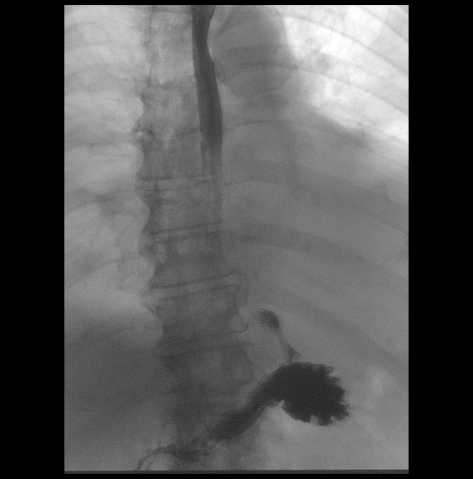
[frame 12/22]
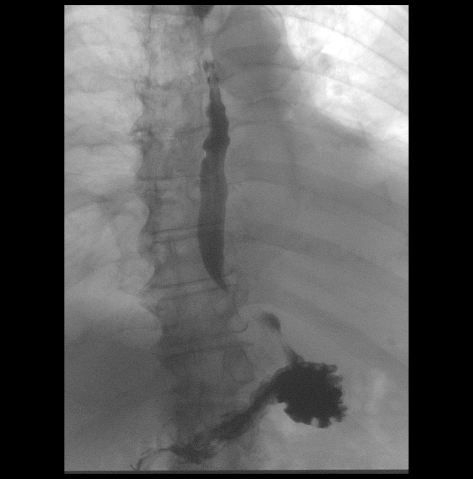
[frame 19/22]
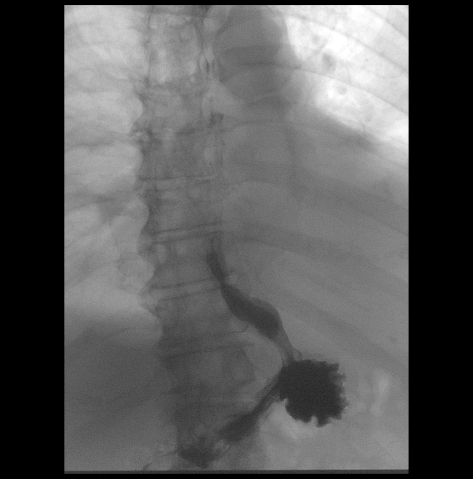
[frame 22/22]
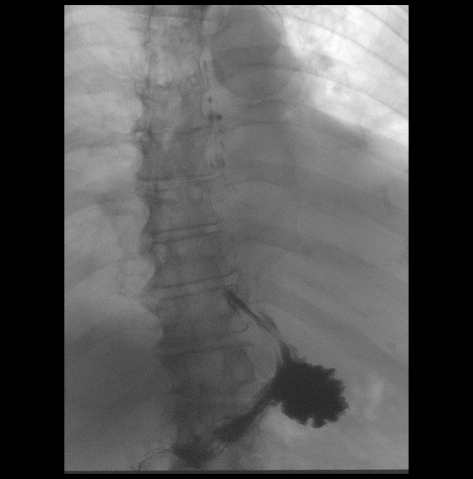

[Series 3: cp_standard · 0.51mm/px · 3 of 40 frames shown (3 of 6)]
[frame 6/40]
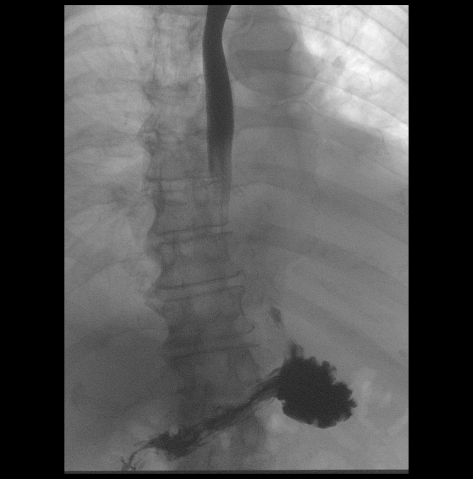
[frame 7/40]
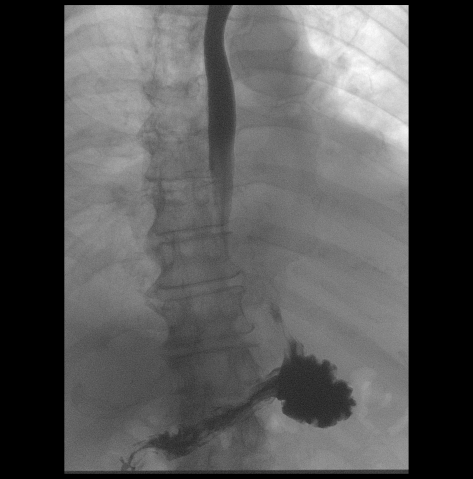
[frame 35/40]
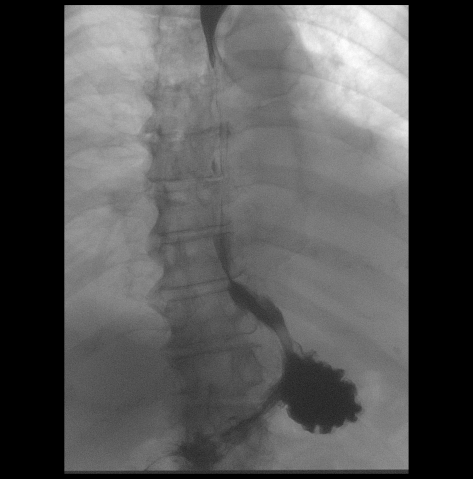

[Series 4: cp_standard · 0.51mm/px · 4 of 28 frames shown (4 of 6)]
[frame 5/28]
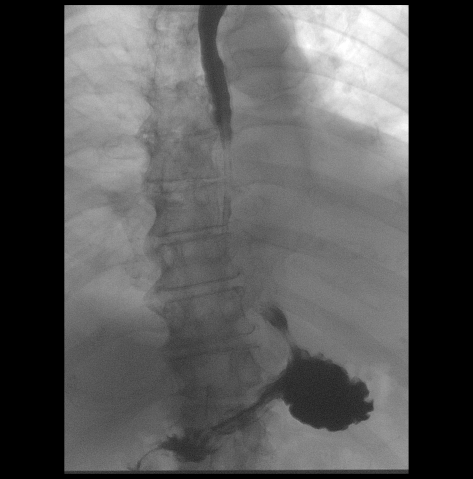
[frame 6/28]
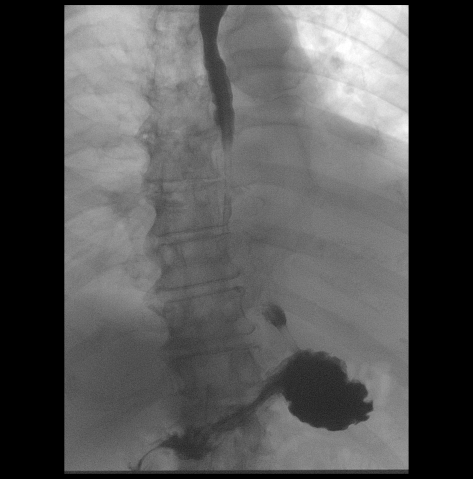
[frame 15/28]
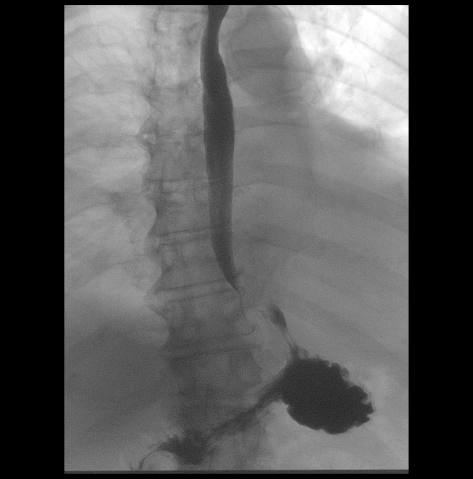
[frame 24/28]
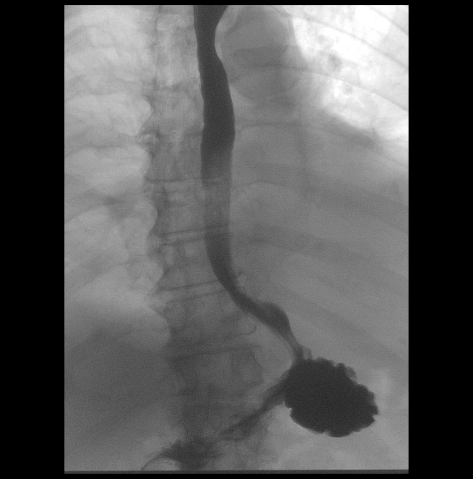

[Series 5: cp_standard · 0.25mm/px · 1 of 1 slices shown (5 of 6)]
[im 1/1]
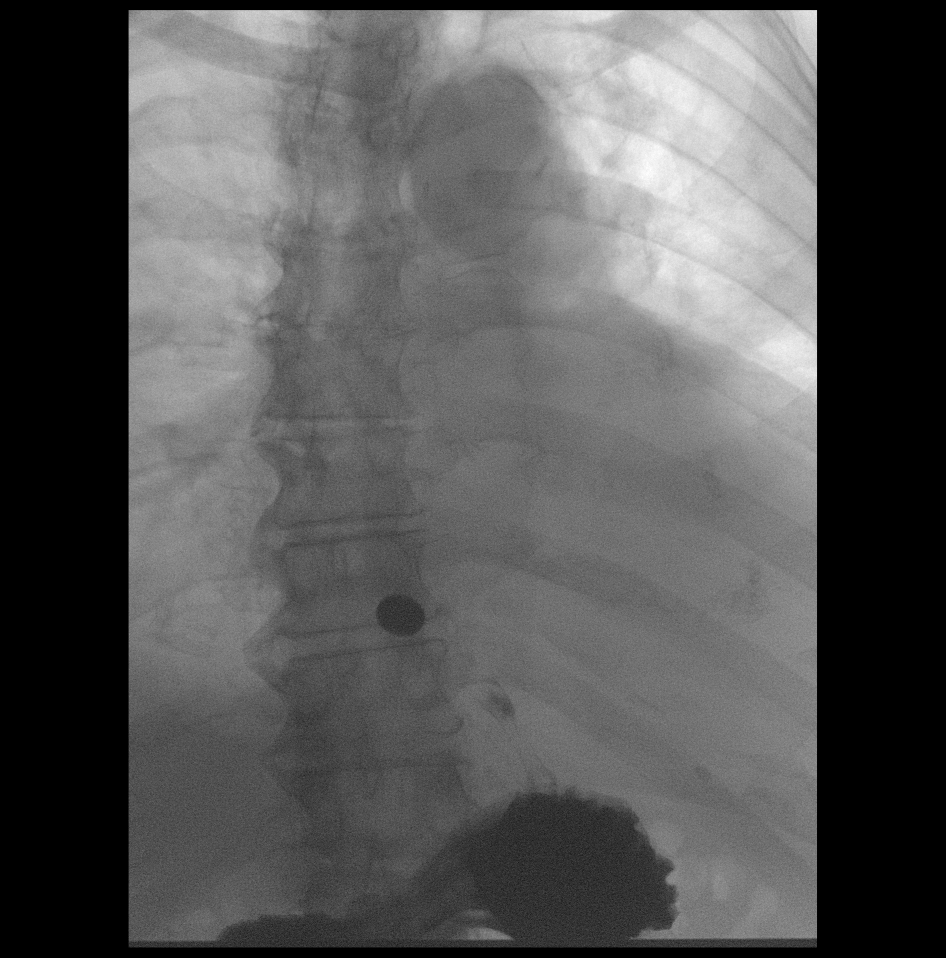

[Series 6: cp_standard · 0.51mm/px · 4 of 38 frames shown (6 of 6)]
[frame 1/38]
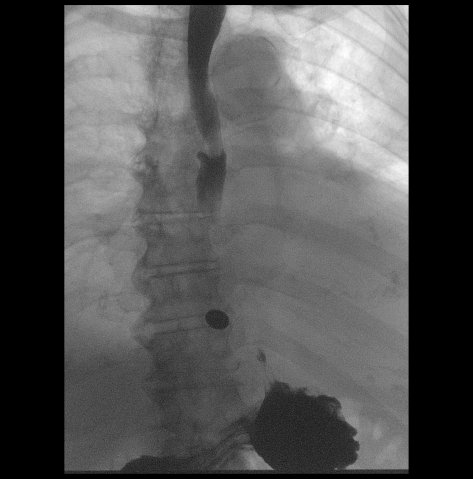
[frame 6/38]
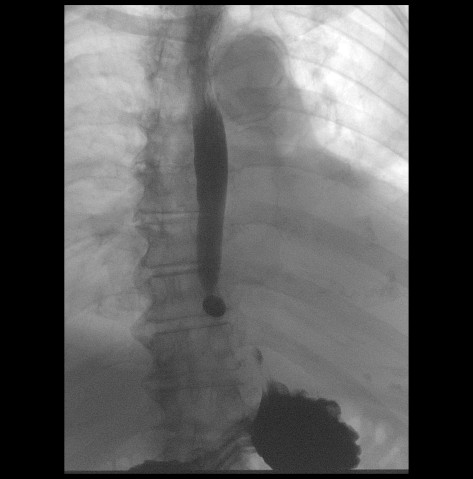
[frame 20/38]
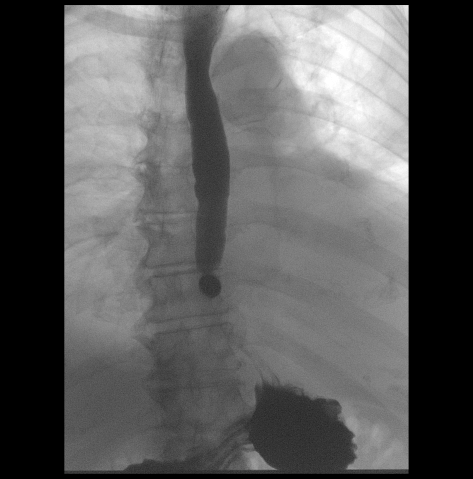
[frame 33/38]
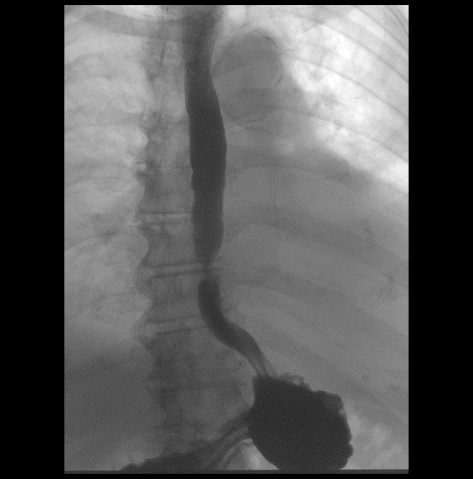

[20 of 21 positions shown; findings below may reference images not displayed]

FINDINGS: Patient was unable to safely stand and esophagus was studied LPO
semi recumbent with single contrast technique.

Pharyngeal function was recently assessed on dedicated study.

There is a smooth mild to moderate narrowing above a small hiatal
hernia, findings of peptic stricture. There is normal distention of
the remainder of the esophagus. No detected mucosal irregularity. A
13 mm barium tablet was slow to pass through the narrowing. Reflux
was not seen during the study and there was no significant stasis in
this patient with cough.

No notable dysmotility or presbyesophagus.
IMPRESSION: 1. Smooth, presumed peptic stricture above a small sliding hiatal
hernia. The narrowing is mild to moderate; a 13 mm barium tablet was
slow to pass through the narrowing.
2. No noted stasis or visualized reflux in this patient with chronic
cough.
